# Patient Record
Sex: Female | Born: 1940 | Race: Black or African American | Hispanic: No | Marital: Married | Smoking: Former smoker
Health system: Southern US, Community
[De-identification: ages and names within clinical notes are randomized; demographics above are authoritative.]

## PROBLEM LIST (undated history)

## (undated) DIAGNOSIS — E119 Type 2 diabetes mellitus without complications: Secondary | ICD-10-CM

## (undated) DIAGNOSIS — J449 Chronic obstructive pulmonary disease, unspecified: Secondary | ICD-10-CM

## (undated) DIAGNOSIS — N183 Chronic kidney disease, stage 3 unspecified: Secondary | ICD-10-CM

## (undated) DIAGNOSIS — K635 Polyp of colon: Secondary | ICD-10-CM

## (undated) DIAGNOSIS — I10 Essential (primary) hypertension: Secondary | ICD-10-CM

## (undated) DIAGNOSIS — K7469 Other cirrhosis of liver: Secondary | ICD-10-CM

## (undated) DIAGNOSIS — E876 Hypokalemia: Secondary | ICD-10-CM

## (undated) DIAGNOSIS — I509 Heart failure, unspecified: Secondary | ICD-10-CM

## (undated) DIAGNOSIS — M109 Gout, unspecified: Secondary | ICD-10-CM

## (undated) DIAGNOSIS — I2510 Atherosclerotic heart disease of native coronary artery without angina pectoris: Secondary | ICD-10-CM

## (undated) HISTORY — PX: CORONARY ARTERY BYPASS GRAFT: SHX141

## (undated) HISTORY — DX: Polyp of colon: K63.5

## (undated) HISTORY — PX: CAROTID ENDARTERECTOMY: SUR193

## (undated) HISTORY — PX: CHOLECYSTECTOMY: SHX55

## (undated) HISTORY — PX: CORONARY ANGIOPLASTY: SHX604

---

## 2016-03-21 ENCOUNTER — Encounter (HOSPITAL_COMMUNITY): Payer: Self-pay | Admitting: Emergency Medicine

## 2016-03-21 DIAGNOSIS — E876 Hypokalemia: Secondary | ICD-10-CM | POA: Diagnosis present

## 2016-03-21 DIAGNOSIS — N179 Acute kidney failure, unspecified: Secondary | ICD-10-CM | POA: Diagnosis not present

## 2016-03-21 DIAGNOSIS — Z794 Long term (current) use of insulin: Secondary | ICD-10-CM | POA: Diagnosis not present

## 2016-03-21 DIAGNOSIS — I5022 Chronic systolic (congestive) heart failure: Secondary | ICD-10-CM | POA: Diagnosis not present

## 2016-03-21 DIAGNOSIS — I2510 Atherosclerotic heart disease of native coronary artery without angina pectoris: Secondary | ICD-10-CM | POA: Diagnosis not present

## 2016-03-21 DIAGNOSIS — Z951 Presence of aortocoronary bypass graft: Secondary | ICD-10-CM | POA: Diagnosis not present

## 2016-03-21 DIAGNOSIS — J449 Chronic obstructive pulmonary disease, unspecified: Secondary | ICD-10-CM | POA: Insufficient documentation

## 2016-03-21 DIAGNOSIS — D62 Acute posthemorrhagic anemia: Secondary | ICD-10-CM | POA: Diagnosis not present

## 2016-03-21 DIAGNOSIS — Z87891 Personal history of nicotine dependence: Secondary | ICD-10-CM | POA: Diagnosis not present

## 2016-03-21 DIAGNOSIS — R262 Difficulty in walking, not elsewhere classified: Secondary | ICD-10-CM | POA: Insufficient documentation

## 2016-03-21 DIAGNOSIS — E871 Hypo-osmolality and hyponatremia: Secondary | ICD-10-CM | POA: Diagnosis not present

## 2016-03-21 DIAGNOSIS — Z7982 Long term (current) use of aspirin: Secondary | ICD-10-CM | POA: Insufficient documentation

## 2016-03-21 DIAGNOSIS — K219 Gastro-esophageal reflux disease without esophagitis: Secondary | ICD-10-CM | POA: Diagnosis not present

## 2016-03-21 DIAGNOSIS — Z7901 Long term (current) use of anticoagulants: Secondary | ICD-10-CM | POA: Diagnosis not present

## 2016-03-21 DIAGNOSIS — N184 Chronic kidney disease, stage 4 (severe): Secondary | ICD-10-CM | POA: Diagnosis not present

## 2016-03-21 DIAGNOSIS — E785 Hyperlipidemia, unspecified: Secondary | ICD-10-CM | POA: Insufficient documentation

## 2016-03-21 DIAGNOSIS — I130 Hypertensive heart and chronic kidney disease with heart failure and stage 1 through stage 4 chronic kidney disease, or unspecified chronic kidney disease: Secondary | ICD-10-CM | POA: Diagnosis not present

## 2016-03-21 DIAGNOSIS — L299 Pruritus, unspecified: Secondary | ICD-10-CM | POA: Insufficient documentation

## 2016-03-21 DIAGNOSIS — D638 Anemia in other chronic diseases classified elsewhere: Secondary | ICD-10-CM | POA: Diagnosis not present

## 2016-03-21 DIAGNOSIS — I482 Chronic atrial fibrillation: Secondary | ICD-10-CM | POA: Diagnosis not present

## 2016-03-21 DIAGNOSIS — E1122 Type 2 diabetes mellitus with diabetic chronic kidney disease: Secondary | ICD-10-CM | POA: Insufficient documentation

## 2016-03-21 HISTORY — DX: Hypokalemia: E87.6

## 2016-03-21 LAB — CBC WITH DIFFERENTIAL/PLATELET
BASOS ABS: 0 10*3/uL (ref 0.0–0.1)
BASOS PCT: 1 %
EOS ABS: 0.1 10*3/uL (ref 0.0–0.7)
EOS PCT: 1 %
HCT: 27.8 % — ABNORMAL LOW (ref 36.0–46.0)
Hemoglobin: 8.6 g/dL — ABNORMAL LOW (ref 12.0–15.0)
LYMPHS PCT: 15 %
Lymphs Abs: 0.9 10*3/uL (ref 0.7–4.0)
MCH: 28.4 pg (ref 26.0–34.0)
MCHC: 30.9 g/dL (ref 30.0–36.0)
MCV: 91.7 fL (ref 78.0–100.0)
MONO ABS: 0.6 10*3/uL (ref 0.1–1.0)
Monocytes Relative: 9 %
Neutro Abs: 4.5 10*3/uL (ref 1.7–7.7)
Neutrophils Relative %: 74 %
PLATELETS: 270 10*3/uL (ref 150–400)
RBC: 3.03 MIL/uL — AB (ref 3.87–5.11)
RDW: 17.4 % — AB (ref 11.5–15.5)
WBC: 6.1 10*3/uL (ref 4.0–10.5)

## 2016-03-21 LAB — COMPREHENSIVE METABOLIC PANEL
ALT: 21 U/L (ref 14–54)
AST: 32 U/L (ref 15–41)
Albumin: 2.5 g/dL — ABNORMAL LOW (ref 3.5–5.0)
Alkaline Phosphatase: 286 U/L — ABNORMAL HIGH (ref 38–126)
Anion gap: 13 (ref 5–15)
BUN: 77 mg/dL — AB (ref 6–20)
CHLORIDE: 90 mmol/L — AB (ref 101–111)
CO2: 27 mmol/L (ref 22–32)
Calcium: 7.8 mg/dL — ABNORMAL LOW (ref 8.9–10.3)
Creatinine, Ser: 2.97 mg/dL — ABNORMAL HIGH (ref 0.44–1.00)
GFR, EST AFRICAN AMERICAN: 17 mL/min — AB (ref >60)
GFR, EST NON AFRICAN AMERICAN: 15 mL/min — AB (ref >60)
Glucose, Bld: 256 mg/dL — ABNORMAL HIGH (ref 65–99)
POTASSIUM: 2.4 mmol/L — AB (ref 3.5–5.1)
SODIUM: 130 mmol/L — AB (ref 135–145)
Total Bilirubin: 1.1 mg/dL (ref 0.3–1.2)
Total Protein: 7.2 g/dL (ref 6.5–8.1)

## 2016-03-21 LAB — CBG MONITORING, ED: GLUCOSE-CAPILLARY: 228 mg/dL — AB (ref 65–99)

## 2016-03-21 NOTE — ED Notes (Signed)
Pt. reports generalized skin itching ( scalp , back , arms ) onset today , airway intact/respirations unlabored , no oral swelling or fever .

## 2016-03-22 ENCOUNTER — Observation Stay (HOSPITAL_COMMUNITY): Payer: Medicare Other

## 2016-03-22 ENCOUNTER — Observation Stay (HOSPITAL_COMMUNITY)
Admission: EM | Admit: 2016-03-22 | Discharge: 2016-03-24 | Disposition: A | Discharge status: Skilled Nursing Facility | Payer: Medicare Other | Attending: Internal Medicine | Admitting: Internal Medicine

## 2016-03-22 ENCOUNTER — Encounter (HOSPITAL_COMMUNITY): Payer: Self-pay | Admitting: General Practice

## 2016-03-22 DIAGNOSIS — D638 Anemia in other chronic diseases classified elsewhere: Secondary | ICD-10-CM | POA: Diagnosis not present

## 2016-03-22 DIAGNOSIS — E876 Hypokalemia: Secondary | ICD-10-CM | POA: Diagnosis present

## 2016-03-22 DIAGNOSIS — I482 Chronic atrial fibrillation, unspecified: Secondary | ICD-10-CM | POA: Diagnosis present

## 2016-03-22 DIAGNOSIS — N184 Chronic kidney disease, stage 4 (severe): Secondary | ICD-10-CM | POA: Diagnosis present

## 2016-03-22 DIAGNOSIS — N289 Disorder of kidney and ureter, unspecified: Secondary | ICD-10-CM

## 2016-03-22 DIAGNOSIS — L299 Pruritus, unspecified: Secondary | ICD-10-CM | POA: Diagnosis not present

## 2016-03-22 DIAGNOSIS — N183 Chronic kidney disease, stage 3 (moderate): Secondary | ICD-10-CM

## 2016-03-22 DIAGNOSIS — E1122 Type 2 diabetes mellitus with diabetic chronic kidney disease: Secondary | ICD-10-CM | POA: Diagnosis present

## 2016-03-22 DIAGNOSIS — I5023 Acute on chronic systolic (congestive) heart failure: Secondary | ICD-10-CM | POA: Diagnosis present

## 2016-03-22 DIAGNOSIS — N179 Acute kidney failure, unspecified: Secondary | ICD-10-CM

## 2016-03-22 DIAGNOSIS — Z794 Long term (current) use of insulin: Secondary | ICD-10-CM

## 2016-03-22 HISTORY — DX: Hypokalemia: E87.6

## 2016-03-22 HISTORY — DX: Chronic kidney disease, stage 3 unspecified: N18.30

## 2016-03-22 HISTORY — DX: Chronic kidney disease, stage 3 (moderate): N18.3

## 2016-03-22 HISTORY — DX: Chronic obstructive pulmonary disease, unspecified: J44.9

## 2016-03-22 HISTORY — DX: Atherosclerotic heart disease of native coronary artery without angina pectoris: I25.10

## 2016-03-22 HISTORY — DX: Essential (primary) hypertension: I10

## 2016-03-22 HISTORY — DX: Gout, unspecified: M10.9

## 2016-03-22 HISTORY — DX: Type 2 diabetes mellitus without complications: E11.9

## 2016-03-22 HISTORY — DX: Heart failure, unspecified: I50.9

## 2016-03-22 LAB — TYPE AND SCREEN
ABO/RH(D): O POS
ANTIBODY SCREEN: NEGATIVE

## 2016-03-22 LAB — COMPREHENSIVE METABOLIC PANEL
ALK PHOS: 273 U/L — AB (ref 38–126)
ALT: 20 U/L (ref 14–54)
ANION GAP: 12 (ref 5–15)
AST: 32 U/L (ref 15–41)
Albumin: 2.4 g/dL — ABNORMAL LOW (ref 3.5–5.0)
BILIRUBIN TOTAL: 1.1 mg/dL (ref 0.3–1.2)
BUN: 76 mg/dL — ABNORMAL HIGH (ref 6–20)
CALCIUM: 7.6 mg/dL — AB (ref 8.9–10.3)
CO2: 25 mmol/L (ref 22–32)
Chloride: 93 mmol/L — ABNORMAL LOW (ref 101–111)
Creatinine, Ser: 2.85 mg/dL — ABNORMAL HIGH (ref 0.44–1.00)
GFR, EST AFRICAN AMERICAN: 18 mL/min — AB (ref >60)
GFR, EST NON AFRICAN AMERICAN: 15 mL/min — AB (ref >60)
GLUCOSE: 220 mg/dL — AB (ref 65–99)
Potassium: 3.3 mmol/L — ABNORMAL LOW (ref 3.5–5.1)
Sodium: 130 mmol/L — ABNORMAL LOW (ref 135–145)
TOTAL PROTEIN: 6.8 g/dL (ref 6.5–8.1)

## 2016-03-22 LAB — URINALYSIS, ROUTINE W REFLEX MICROSCOPIC
Bilirubin Urine: NEGATIVE
Glucose, UA: NEGATIVE mg/dL
HGB URINE DIPSTICK: NEGATIVE
Ketones, ur: NEGATIVE mg/dL
LEUKOCYTES UA: NEGATIVE
Nitrite: NEGATIVE
PROTEIN: 100 mg/dL — AB
Specific Gravity, Urine: 1.013 (ref 1.005–1.030)
pH: 6.5 (ref 5.0–8.0)

## 2016-03-22 LAB — URINE MICROSCOPIC-ADD ON
RBC / HPF: NONE SEEN RBC/hpf (ref 0–5)
WBC, UA: NONE SEEN WBC/hpf (ref 0–5)

## 2016-03-22 LAB — CBC WITH DIFFERENTIAL/PLATELET
Basophils Absolute: 0 10*3/uL (ref 0.0–0.1)
Basophils Relative: 1 %
Eosinophils Absolute: 0.1 10*3/uL (ref 0.0–0.7)
Eosinophils Relative: 1 %
HEMATOCRIT: 27.4 % — AB (ref 36.0–46.0)
HEMOGLOBIN: 8.5 g/dL — AB (ref 12.0–15.0)
LYMPHS ABS: 1.1 10*3/uL (ref 0.7–4.0)
Lymphocytes Relative: 17 %
MCH: 28.1 pg (ref 26.0–34.0)
MCHC: 31 g/dL (ref 30.0–36.0)
MCV: 90.4 fL (ref 78.0–100.0)
MONOS PCT: 12 %
Monocytes Absolute: 0.8 10*3/uL (ref 0.1–1.0)
NEUTROS ABS: 4.9 10*3/uL (ref 1.7–7.7)
NEUTROS PCT: 71 %
Platelets: 266 10*3/uL (ref 150–400)
RBC: 3.03 MIL/uL — ABNORMAL LOW (ref 3.87–5.11)
RDW: 17.2 % — ABNORMAL HIGH (ref 11.5–15.5)
WBC: 6.9 10*3/uL (ref 4.0–10.5)

## 2016-03-22 LAB — GLUCOSE, CAPILLARY
GLUCOSE-CAPILLARY: 203 mg/dL — AB (ref 65–99)
GLUCOSE-CAPILLARY: 158 mg/dL — AB (ref 65–99)
Glucose-Capillary: 203 mg/dL — ABNORMAL HIGH (ref 65–99)
Glucose-Capillary: 155 mg/dL — ABNORMAL HIGH (ref 65–99)

## 2016-03-22 LAB — IRON AND TIBC
Iron: 36 ug/dL (ref 28–170)
Saturation Ratios: 8 % — ABNORMAL LOW (ref 10.4–31.8)
TIBC: 440 ug/dL (ref 250–450)
UIBC: 404 ug/dL

## 2016-03-22 LAB — MAGNESIUM: Magnesium: 1.7 mg/dL (ref 1.7–2.4)

## 2016-03-22 LAB — ABO/RH: ABO/RH(D): O POS

## 2016-03-22 MED ORDER — INSULIN ASPART 100 UNIT/ML ~~LOC~~ SOLN
0.0000 [IU] | Freq: Three times a day (TID) | SUBCUTANEOUS | Status: DC
  Administered 2016-03-22: 3 [IU] via SUBCUTANEOUS
  Administered 2016-03-22: 2 [IU] via SUBCUTANEOUS
  Administered 2016-03-22: 3 [IU] via SUBCUTANEOUS
  Administered 2016-03-23: 2 [IU] via SUBCUTANEOUS
  Administered 2016-03-23: 3 [IU] via SUBCUTANEOUS
  Administered 2016-03-24 (×2): 1 [IU] via SUBCUTANEOUS
  Administered 2016-03-24: 3 [IU] via SUBCUTANEOUS

## 2016-03-22 MED ORDER — SODIUM CHLORIDE 0.9 % IV SOLN
INTRAVENOUS | Status: DC
  Administered 2016-03-22: 06:00:00 via INTRAVENOUS

## 2016-03-22 MED ORDER — HEPARIN SODIUM (PORCINE) 5000 UNIT/ML IJ SOLN: 5000.0000 [IU] | Freq: Three times a day (TID) | INTRAMUSCULAR | Status: DC

## 2016-03-22 MED ORDER — ACETAMINOPHEN 650 MG RE SUPP: 650.0000 mg | Freq: Four times a day (QID) | RECTAL | Status: DC | PRN

## 2016-03-22 MED ORDER — SODIUM CHLORIDE 0.9 % IV BOLUS (SEPSIS)
250.0000 mL | Freq: Once | INTRAVENOUS | Status: AC
  Administered 2016-03-22: 250 mL via INTRAVENOUS

## 2016-03-22 MED ORDER — HYDROXYZINE HCL 25 MG PO TABS: 25.0000 mg | ORAL_TABLET | ORAL | Status: DC | PRN

## 2016-03-22 MED ORDER — ALBUTEROL SULFATE (2.5 MG/3ML) 0.083% IN NEBU: 2.5000 mg | INHALATION_SOLUTION | RESPIRATORY_TRACT | Status: DC | PRN

## 2016-03-22 MED ORDER — GABAPENTIN 300 MG PO CAPS
300.0000 mg | ORAL_CAPSULE | Freq: Every day | ORAL | Status: DC | PRN
  Administered 2016-03-24: 300 mg via ORAL
  Filled 2016-03-22: qty 1

## 2016-03-22 MED ORDER — POTASSIUM CHLORIDE CRYS ER 20 MEQ PO TBCR
40.0000 meq | EXTENDED_RELEASE_TABLET | Freq: Once | ORAL | Status: AC
  Administered 2016-03-22: 40 meq via ORAL
  Filled 2016-03-22: qty 2

## 2016-03-22 MED ORDER — MAGNESIUM SULFATE 2 GM/50ML IV SOLN
2.0000 g | Freq: Once | INTRAVENOUS | Status: AC
  Administered 2016-03-22: 2 g via INTRAVENOUS
  Filled 2016-03-22: qty 50

## 2016-03-22 MED ORDER — ONDANSETRON HCL 4 MG/2ML IJ SOLN: 4.0000 mg | Freq: Four times a day (QID) | INTRAMUSCULAR | Status: DC | PRN

## 2016-03-22 MED ORDER — HYDROXYZINE HCL 25 MG PO TABS
25.0000 mg | ORAL_TABLET | Freq: Once | ORAL | Status: AC
  Administered 2016-03-22: 25 mg via ORAL
  Filled 2016-03-22: qty 1

## 2016-03-22 MED ORDER — INSULIN ASPART 100 UNIT/ML ~~LOC~~ SOLN
0.0000 [IU] | Freq: Every day | SUBCUTANEOUS | Status: DC
  Administered 2016-03-23: 2 [IU] via SUBCUTANEOUS

## 2016-03-22 MED ORDER — ONDANSETRON HCL 4 MG PO TABS: 4.0000 mg | ORAL_TABLET | Freq: Four times a day (QID) | ORAL | Status: DC | PRN

## 2016-03-22 MED ORDER — POTASSIUM CHLORIDE 10 MEQ/100ML IV SOLN
10.0000 meq | INTRAVENOUS | Status: DC
  Filled 2016-03-22: qty 100

## 2016-03-22 MED ORDER — HYDROXYZINE HCL 25 MG PO TABS
25.0000 mg | ORAL_TABLET | Freq: Four times a day (QID) | ORAL | Status: DC | PRN
  Administered 2016-03-22 (×4): 50 mg via ORAL
  Administered 2016-03-23 – 2016-03-24 (×3): 25 mg via ORAL
  Filled 2016-03-22 (×2): qty 1
  Filled 2016-03-22 (×3): qty 2
  Filled 2016-03-22: qty 1
  Filled 2016-03-22: qty 2

## 2016-03-22 MED ORDER — INSULIN GLARGINE 100 UNIT/ML ~~LOC~~ SOLN
10.0000 [IU] | Freq: Every day | SUBCUTANEOUS | Status: DC
  Administered 2016-03-22 – 2016-03-24 (×3): 10 [IU] via SUBCUTANEOUS
  Filled 2016-03-22 (×3): qty 0.1

## 2016-03-22 MED ORDER — ACETAMINOPHEN 325 MG PO TABS
650.0000 mg | ORAL_TABLET | Freq: Four times a day (QID) | ORAL | Status: DC | PRN
  Administered 2016-03-22: 650 mg via ORAL
  Filled 2016-03-22: qty 2

## 2016-03-22 MED ORDER — SODIUM CHLORIDE 0.9% FLUSH
3.0000 mL | Freq: Two times a day (BID) | INTRAVENOUS | Status: DC
  Administered 2016-03-22 – 2016-03-24 (×4): 3 mL via INTRAVENOUS

## 2016-03-22 MED ORDER — POTASSIUM CHLORIDE 10 MEQ/100ML IV SOLN
10.0000 meq | INTRAVENOUS | Status: AC
  Administered 2016-03-22 (×6): 10 meq via INTRAVENOUS
  Filled 2016-03-22 (×4): qty 100

## 2016-03-22 MED ORDER — INSULIN GLARGINE 100 UNIT/ML ~~LOC~~ SOLN
15.0000 [IU] | Freq: Every day | SUBCUTANEOUS | Status: DC
  Filled 2016-03-22: qty 0.15

## 2016-03-22 MED ORDER — AMLODIPINE BESYLATE 10 MG PO TABS
10.0000 mg | ORAL_TABLET | Freq: Every day | ORAL | Status: DC
Start: 2016-03-22 — End: 2016-03-24
  Administered 2016-03-22 – 2016-03-24 (×3): 10 mg via ORAL
  Filled 2016-03-22 (×2): qty 2
  Filled 2016-03-22: qty 1

## 2016-03-22 MED ORDER — PANTOPRAZOLE SODIUM 40 MG PO TBEC
40.0000 mg | DELAYED_RELEASE_TABLET | Freq: Two times a day (BID) | ORAL | Status: DC
  Administered 2016-03-22 – 2016-03-24 (×5): 40 mg via ORAL
  Filled 2016-03-22 (×5): qty 1

## 2016-03-22 MED ORDER — PERMETHRIN 5 % EX CREA
TOPICAL_CREAM | Freq: Once | CUTANEOUS | Status: AC
  Administered 2016-03-22: 04:00:00 via TOPICAL
  Filled 2016-03-22: qty 60

## 2016-03-22 NOTE — Evaluation (Signed)
Physical Therapy Evaluation Patient Details Name: Cassandra Bonilla MRN: [id number] DOB: [birth year] Today's Date: 03/22/2016   History of Present Illness  Pt is a 75 y/o female who presents with "itching all over" for 4-6 weeks. Pt lives at home with husband and other family, and reports recent falls and LE swelling. PMH includes CAD s/p CABG, HTN, systolic CHF last echo in 01/2016 EF noted to be 40-45% with severe pulmonary hypertension, CKD stage III-IV, chronic A. Fib.  Clinical Impression  Pt admitted with above diagnosis. Pt currently with functional limitations due to the deficits listed below (see PT Problem List). At the time of PT eval pt was able to perform transfers and ambulation with min guard to min assist for balance support, walker management, and general safety. Pt reports recent falls at home, and feel she would benefit from short term rehab at the SNF level prior to return home. Pt agreeable. Pt will benefit from skilled PT to increase their independence and safety with mobility to allow discharge to the venue listed below.       Follow Up Recommendations SNF;Supervision/Assistance - 24 hour    Equipment Recommendations  Rolling walker with 5" wheels;3in1 (PT)    Recommendations for Other Services       Precautions / Restrictions Precautions Precautions: Fall Restrictions Weight Bearing Restrictions: No      Mobility  Bed Mobility Overal bed mobility: Modified Independent             General bed mobility comments: Pt was able to transition to EOB with no physical assist and no use of rails. HOB flat.   Transfers Overall transfer level: Needs assistance Equipment used: Rolling walker (2 wheeled) Transfers: Sit to/from Stand Sit to Stand: Min guard         General transfer comment: Pt required hands-on guarding to power-up to full standing position. Initially attempted without AD and pt was not able to maintain standing. With RW pt was able to steady herself and  maintain standing prior to initiation of gait training.   Ambulation/Gait Ambulation/Gait assistance: Min assist Ambulation Distance (Feet): 60 Feet Assistive device: Rolling walker (2 wheeled) Gait Pattern/deviations: Step-through pattern;Decreased stride length;Trunk flexed Gait velocity: Decreased Gait velocity interpretation: Below normal speed for age/gender General Gait Details: Fatigues quickly. Max encouragement required to achieve 60 feet. Pt required frequent assist for walker management especially with turns.   Stairs            Wheelchair Mobility    Modified Rankin (Stroke Patients Only)       Balance Overall balance assessment: Needs assistance Sitting-balance support: Feet supported;No upper extremity supported Sitting balance-Leahy Scale: Fair     Standing balance support: Bilateral upper extremity supported;During functional activity Standing balance-Leahy Scale: Poor                               Pertinent Vitals/Pain Pain Assessment: No/denies pain    Home Living Family/patient expects to be discharged to:: Private residence Living Arrangements: Spouse/significant other;Children;Other relatives (An Aunt) Available Help at Discharge: Family;Available 24 hours/day Type of Home: House Home Access: Stairs to enter Entrance Stairs-Rails: Psychiatric nurse of Steps: 6 (questionable) Home Layout: One level Home Equipment: Cane - single point      Prior Function Level of Independence: Independent               Hand Dominance   Dominant Hand: Right    Extremity/Trunk Assessment  Upper Extremity Assessment: Defer to OT evaluation;Generalized weakness           Lower Extremity Assessment: Generalized weakness      Cervical / Trunk Assessment: Kyphotic  Communication   Communication: No difficulties  Cognition Arousal/Alertness: Awake/alert Behavior During Therapy: WFL for tasks  assessed/performed Overall Cognitive Status: Within Functional Limits for tasks assessed                      General Comments      Exercises        Assessment/Plan    PT Assessment Patient needs continued PT services  PT Diagnosis Difficulty walking;Acute pain   PT Problem List Decreased strength;Decreased range of motion;Decreased activity tolerance;Decreased balance;Decreased mobility;Decreased knowledge of use of DME;Decreased safety awareness;Decreased knowledge of precautions;Pain  PT Treatment Interventions DME instruction;Gait training;Stair training;Functional mobility training;Therapeutic activities;Therapeutic exercise;Neuromuscular re-education;Patient/family education   PT Goals (Current goals can be found in the Care Plan section) Acute Rehab PT Goals Patient Stated Goal: Get better PT Goal Formulation: With patient Time For Goal Achievement: 03/29/16 Potential to Achieve Goals: Good    Frequency Min 2X/week   Barriers to discharge        Co-evaluation               End of Session Equipment Utilized During Treatment: Gait belt Activity Tolerance: Patient limited by fatigue Patient left: with call bell/phone within reach;Other (comment) (On BSC - NT aware) Nurse Communication: Mobility status    Functional Assessment Tool Used: Clinical judgement Functional Limitation: Mobility: Walking and moving around Mobility: Walking and Moving Around Current Status [id number]): At least 1 percent but less than 20 percent impaired, limited or restricted Mobility: Walking and Moving Around Goal Status [phone]): At least 1 percent but less than 20 percent impaired, limited or restricted    Time: [id number] PT Time Calculation (min) (ACUTE ONLY): 26 min   Charges:   PT Evaluation $PT Eval Moderate Complexity: 1 Procedure PT Treatments $Gait Training: 8-22 mins   PT G Codes:   PT G-Codes **NOT FOR INPATIENT CLASS** Functional Assessment Tool Used: Clinical  judgement Functional Limitation: Mobility: Walking and moving around Mobility: Walking and Moving Around Current Status [id number]): At least 1 percent but less than 20 percent impaired, limited or restricted Mobility: Walking and Moving Around Goal Status [phone]): At least 1 percent but less than 20 percent impaired, limited or restricted    Rolinda Roan 03/22/2016, 2:55 PM  Rolinda Roan, PT, DPT Acute Rehabilitation Services Pager: [phone]

## 2016-03-22 NOTE — Clinical Social Work Note (Signed)
Clinical Social Work Assessment  Patient Details  Name: Cassandra Bonilla MRN: [id number] Date of Birth: [birth year]  Date of referral:  03/22/16               Reason for consult:  Facility Placement, Discharge Planning                Permission sought to share information with:  Facility Sport and exercise psychologist, Family Supports Permission granted to share information::  Yes, Verbal Permission Granted  Name::     Veterinary surgeon::  SNF's  Relationship::  Spouse  Contact Information:  [phone]  Housing/Transportation Living arrangements for the past 2 months:  Single Family Home Source of Information:  Patient, Medical Team Patient Interpreter Needed:  None Criminal Activity/Legal Involvement Pertinent to Current Situation/Hospitalization:  No - Comment as needed Significant Relationships:  Adult Children, Spouse, Other Family Members Lives with:  Adult Children, Spouse, Other (Comment) (Aunt) Do you feel safe going back to the place where you live?  Yes Need for family participation in patient care:  Yes (Comment)  Care giving concerns:  PT recommending SNF once medically stable for discharge.   Social Worker assessment / plan:  CSW met with patient. No supports at bedside. CSW introduced role and explained that discharge planning would be discussed. CSW confirmed PT recommendations with patient. Patient agreeable. No preference on facility. No further concerns. CSW encouraged patient to contact CSW as needed. CSW will continue to follow patient for support and facilitate discharge to SNF once medically stable.  Employment status:  Retired Nurse, adult PT Recommendations:  [place] / Referral to community resources:  [place]  Patient/Family's Response to care:  Patient agreeable to SNF placement. Patient's family supportive and involved in patient's care. Patient appreciated social work  intervention.  Patient/Family's Understanding of and Emotional Response to Diagnosis, Current Treatment, and Prognosis:  Patient understands need for rehab prior to returning home. Patient appears happy with care at hospital.  Emotional Assessment Appearance:  Appears stated age Attitude/Demeanor/Rapport:   (Pleasant) Affect (typically observed):  Accepting, Appropriate, Calm, Pleasant Orientation:  Oriented to Self, Oriented to Place, Oriented to  Time, Oriented to Situation Alcohol / Substance use:  Never Used Psych involvement (Current and /or in the community):  No (Comment)  Discharge Needs  Concerns to be addressed:  Care Coordination Readmission within the last 30 days:  No Current discharge risk:  Dependent with Mobility Barriers to Discharge:  No Barriers Identified   Candie Chroman, LCSW 03/22/2016, 4:11 PM

## 2016-03-22 NOTE — ED Provider Notes (Signed)
CSN: [id number]     Arrival date & time 03/21/16  2202 History  By signing my name below, I, Evelene Croon, attest that this documentation has been prepared under the direction and in the presence of Merryl Hacker, MD . Electronically Signed: Evelene Croon, Scribe. 03/22/2016. 12:49 AM.  Chief Complaint  Patient presents with  . Skin Problem    The history is provided by the patient and the spouse. No language interpreter was used.    HPI Comments:  Shardea Richmond is a 75 y.o. female who presents to the Emergency Department complaining of generalized pruritus x a few days. She reports a h/o similar episode of few weeks ago in [place]; husband states she was evaluated by her PCP in [place].  She was not diagnosed with anything. She denies any new exposures or medications. She denies rash but the daughter is concerned about scabies as there have been recent outbreaks. No one else in the house has had a similar rash. Pt has a h/o liver and kidney disease, and h/o hypokalemia but pt is not currently taking any potassium supplements. She denies CP, SOB, fever, and weakness. No alleviating factors noted; denies use of benadryl.    Patient is generally a poor historian. She just moved to [place] from [place]. No old records available.    Past Medical History  Diagnosis Date  . Diabetes mellitus without complication ([place])   . Hypertension   . COPD (chronic obstructive pulmonary disease) ([place])   . CHF (congestive heart failure) ([place])   . Gout   . Coronary artery disease    Past Surgical History  Procedure Laterality Date  . Cardiac surgery     No family history on file. Social History  Substance Use Topics  . Smoking status: Former Research scientist (life sciences)  . Smokeless tobacco: None  . Alcohol Use: No   OB History    No data available     Review of Systems  Constitutional: Negative for fever and chills.  Respiratory: Negative for shortness of breath.   Cardiovascular: Negative for chest pain.   Skin: Positive for rash.       + Pruritus  Neurological: Negative for weakness.    Allergies  Review of patient's allergies indicates no known allergies.  Home Medications   Prior to Admission medications   Not on File   BP 132/68 mmHg  Pulse 89  Temp(Src) 98.5 F (36.9 C) (Oral)  Resp 18  Ht 5\' 2"  (1.575 m)  Wt 149 lb 5 oz (67.728 kg)  BMI 27.30 kg/m2  SpO2 100% Physical Exam  Constitutional: She is oriented to person, place, and time.  Chronically ill-appearing, no acute distress  HENT:  Head: Normocephalic and atraumatic.  Eyes: Pupils are equal, round, and reactive to light.  Cardiovascular: Normal rate and regular rhythm.   Murmur heard. Pulmonary/Chest: Effort normal and breath sounds normal. No respiratory distress. She has no wheezes.  Abdominal: Soft. Bowel sounds are normal. There is no tenderness. There is no rebound.  Neurological: She is alert and oriented to person, place, and time.  Skin: Skin is warm and dry.  Dry skin noted, she does have excoriations and linear hyper pigmentations over the bilateral upper extremities as well as the bilateral ankles, no redness or erythema  Psychiatric: She has a normal mood and affect.  Nursing note and vitals reviewed.   ED Course  Procedures   DIAGNOSTIC STUDIES:  Oxygen Saturation is 100% on RA, normal by my interpretation.  COORDINATION OF CARE:  12:45 AM Discussed treatment plan with pt at bedside and pt agreed to plan.  Labs Review Labs Reviewed  CBC WITH DIFFERENTIAL/PLATELET - Abnormal; Notable for the following:    RBC 3.03 (*)    Hemoglobin 8.6 (*)    HCT 27.8 (*)    RDW 17.4 (*)    All other components within normal limits  COMPREHENSIVE METABOLIC PANEL - Abnormal; Notable for the following:    Sodium 130 (*)    Potassium 2.4 (*)    Chloride 90 (*)    Glucose, Bld 256 (*)    BUN 77 (*)    Creatinine, Ser 2.97 (*)    Calcium 7.8 (*)    Albumin 2.5 (*)    Alkaline Phosphatase 286 (*)     GFR calc non Af Amer 15 (*)    GFR calc Af Amer 17 (*)    All other components within normal limits  URINALYSIS, ROUTINE W REFLEX MICROSCOPIC (NOT AT [facility]) - Abnormal; Notable for the following:    Protein, ur 100 (*)    All other components within normal limits  URINE MICROSCOPIC-ADD ON - Abnormal; Notable for the following:    Squamous Epithelial / LPF 0-5 (*)    Bacteria, UA RARE (*)    All other components within normal limits  CBG MONITORING, ED - Abnormal; Notable for the following:    Glucose-Capillary 228 (*)    All other components within normal limits  MAGNESIUM    Imaging Review No results found. I have personally reviewed and evaluated these images and lab results as part of my medical decision-making.   EKG Interpretation   Date/Time:  Friday March 22 2016 01:38:51 EDT Ventricular Rate:  95 PR Interval:    QRS Duration: 96 QT Interval:  386 QTC Calculation: 486 R Axis:   91 Text Interpretation:  Atrial fibrillation Consider left ventricular  hypertrophy Inferior infarct, old Lateral leads are also involved No prior  for comparison Confirmed by Eero Dini  MD, Loma Sousa ([id number]) on 03/22/2016  1:54:05 AM       1:43 AM Discussed case with Dr.Rondell Tamala Julian (hospitalist)    MDM   Final diagnoses:  Pruritic condition  Hypokalemia  Renal disease    Patient presents with itching.  She denies any other symptoms at this time. She is a poor historian but it appears that she has an extensive heart history as well as renal failure and liver disease at baseline.  Lab work was sent from triage and shows mild hyponatremia and hypokalemia with a K of 2.4. Creatinine is 2.97. Baseline unknown. Hemoglobin 8.6. Again Baseline unknown. EKG shows no evidence of ischemia. Patient reports acute on chronic itchiness. She does have some linear excoriations they could be concerning for scabies especially given the daughter's concern; however, no one else seems to have similar symptoms.  Will treat presumptively. She was placed on contact precautions. Given her hypokalemia, will admit to the hospitalist for potassium replacement and repeat lab work.  I personally performed the services described in this documentation, which was scribed in my presence. The recorded information has been reviewed and is accurate.    Merryl Hacker, MD 03/22/16 [phone]

## 2016-03-22 NOTE — Clinical Social Work Placement (Signed)
   CLINICAL SOCIAL WORK PLACEMENT  NOTE  Date:  03/22/2016  Patient Details  Name: Cassandra Bonilla MRN: [id number] Date of Birth: 07/07/1941  Clinical Social Work is seeking post-discharge placement for this patient at the [place] level of care (*CSW will initial, date and re-position this form in  chart as items are completed):  Yes   Patient/family provided with [facility] Work Department's list of facilities offering this level of care within the geographic area requested by the patient (or if unable, by the patient's family).  Yes   Patient/family informed of their freedom to choose among providers that offer the needed level of care, that participate in Medicare, Medicaid or managed care program needed by the patient, have an available bed and are willing to accept the patient.  Yes   Patient/family informed of [facility]'s ownership interest in [facility] and [facility], as well as of the fact that they are under no obligation to receive care at these facilities.  PASRR submitted to EDS on 03/22/16     PASRR number received on 03/22/16     Existing PASRR number confirmed on       FL2 transmitted to all facilities in geographic area requested by pt/family on 03/22/16     FL2 transmitted to all facilities within larger geographic area on       Patient informed that his/her managed care company has contracts with or will negotiate with certain facilities, including the following:            Patient/family informed of bed offers received.  Patient chooses bed at       Physician recommends and patient chooses bed at      Patient to be transferred to   on  .  Patient to be transferred to facility by       Patient family notified on   of transfer.  Name of family member notified:        PHYSICIAN Please sign FL2     Additional Comment:    _______________________________________________ Candie Chroman, LCSW 03/22/2016, 4:14  PM

## 2016-03-22 NOTE — Progress Notes (Signed)
75 year old lady with extensive past medical history significant of CAD s/p CABG, HTN, IDDM, systolic CHF last echo in 01/2016 EF noted to be 40-45% with severe pulmonary hypertension, CKD stage III-IV, chronic A. Fib, off anticoagulation, recent GI bleed,  presents with chief complaints of itching all over for the last 4 to 6 weeks, intermittently, unclear what exacerbates it , but atarax helps relieve it.  She was also found to be anemic this admission and will request Gi consultation for possible colonoscopy.  Resume atarax.   Please see H&P in detail by Dr Tamala Julian earlier this am for further details.    Hosie Poisson, MD [phone]

## 2016-03-22 NOTE — Care Management Obs Status (Signed)
[facility] NOTIFICATION   Patient Details  Name: Cassandra Bonilla MRN: [id number] Date of Birth: April 27, 1941   Medicare Observation Status Notification Given:  Yes (pt stated she couldnt sign documents and daughter at bedside refused to sign)    Maryclare Labrador, RN 03/22/2016, 4:16 PM

## 2016-03-22 NOTE — Care Management Note (Addendum)
Case Management Note  Patient Details  Name: Cassandra Bonilla MRN: [id number] Date of Birth: [birth year]  Subjective/Objective:    Pt admitted with hypokalemia                Action/Plan:  Pt recently moved to [place] from [place].  Stays with husband - daughter at bedside and reports concerns with mom returning home due to lack of steadiness with mobility and recent falls.  CM placed PT/OT eval and informed CSW of tentative referral.  CM provided health connect number for PCP assistance - informed that she can not provide medication assistance due to current coverage with insurance.  Also, informed pt that she could call number located on back of insurance card for assistance with finding a PCP.  CM will continue to follow for discharge needs   Expected Discharge Date:                  Expected Discharge Plan:  [name]  In-House Referral:  Clinical Social Work  Discharge planning Services  CM Consult  Post Acute Care Choice:    Choice offered to:     DME Arranged:    DME Agency:     HH Arranged:    [place] Agency:     Status of Service:  In process, will continue to follow  If discussed at Long Length of Stay Meetings, dates discussed:    Additional Comments: CSW consulted for SNF Maryclare Labrador, RN 03/22/2016, 1:27 PM

## 2016-03-22 NOTE — H&P (Addendum)
History and Physical    Cassandra Bonilla [id number] DOB: July 11, 1941 DOA: 03/22/2016  Referring MD/NP/PA: Dr. Dina Rich PCP: No primary care provider on file.  Patient coming from: Home  Chief Complaint: "I was itching all over"  HPI: Cassandra Bonilla is a 75 y.o. female with extensive past medical history significant of CAD s/p CABG, HTN, IDDM, systolic CHF last echo in 01/2016 EF noted to be 40-45% with severe pulmonary hypertension, CKD stage III-IV, chronic A. Fib; who presents with complaints of itching all over. Patient previously lived in [place], and had just moved to [place]. States symptoms have occurred off and on over the last 1 month. Patient states that there is not one specific spot on her body that is affected more than another. Family members in the room state that symptoms have intermittently occurred for a longer period of time. Patient notes that during her previous hospitalization last month in [place] and they gave her some medicine to help control the itching during that time. Family denies any history of the patient having scabies or anyone else in the household having similar symptoms. Patient also was noted to have blood glucose levels as high as 500 at home. Patient's husband is able to produce some documentation regarding most recent hospitalization at [facility] in [place], [place] at the end of May through the earlier part of June for her bleeding. She had been on Coumadin for the history of atrial fibrillation. During the hospitalization she reportedly had a EGD, but was unable to be successfully prepped for a colonoscopy as an inpatient. She required two blood transfusions and family notes that she was stopped on Coumadin, atenolol, metoprolol, and Lantus at discharge. Patient started new medications of Protonix 40 mg by mouth twice a day. She was continued on previous medications of allopurinol 100 mg daily, amlodipine 10 mg daily, aspirin 81 mg daily,  atorvastatin 40 mg daily, furosemide 80 mg twice a day, and Humalog 4 units subcutaneous prior to dinner. Lab work on 6/1 showed a hemoglobin of 9.5, creatinine  1.9, BUN 59. Patient has never had a colonoscopy previously in the past. Patient denies any significant shortness of breath, chest pain, dizziness, diarrhea, blood in stool/urine.   ED Course: Upon admission to the emergency department patient was evaluated and seen to be afebrile with vitals within normal limits. Lab work: WBC 6.1, hemoglobin 8.6, platelets 270, sodium 1:30, potassium 2.4, chloride 90, BUN 77, creatinine 2.97, calcium 7.8, glucose 256. Urinalysis was negative for any acute abnormalities. Patient was given 40 mEq of potassium chloride orally and started on IV potassium as well n the ED. She also received hydroxyzine and permethrin cream. TRH called to admit.  Review of Systems: As per HPI otherwise 10 point review of systems negative.   Past Medical History  Diagnosis Date  . Diabetes mellitus without complication ([place])   . Hypertension   . COPD (chronic obstructive pulmonary disease) ([place])   . CHF (congestive heart failure) ([place])   . Gout   . Coronary artery disease     Past Surgical History  Procedure Laterality Date  . Cardiac surgery       reports that she has quit smoking. She does not have any smokeless tobacco history on file. She reports that she does not drink alcohol or use illicit drugs.  No Known Allergies  No family history on file.  Prior to Admission medications   Not on File    Physical Exam:  Constitutional:  Elderly female who appears to be in mild distress unable to get comfortable on the hospital gurney Filed Vitals:   03/21/16 2209  BP: 132/68  Pulse: 89  Temp: 98.5 F (36.9 C)  TempSrc: Oral  Resp: 18  Height: 5\' 2"  (1.575 m)  Weight: 67.728 kg (149 lb 5 oz)  SpO2: 100%   Eyes: PERRL, lids and conjunctivae normal ENMT: Mucous membranes are moist. Posterior pharynx clear of  any exudate or lesions.Normal dentition.  Neck: normal, supple, no masses, no thyromegaly Respiratory: clear to auscultation bilaterally, no wheezing, no crackles. Normal respiratory effort. No accessory muscle use.  Cardiovascular: Regular rate and rhythm, no murmurs / rubs / gallops. +1 pitting edema of the bilateral lower extremities to the mid tibial region. 2+ pedal pulses. No carotid bruits.  Abdomen: no tenderness, no masses palpated. No hepatosplenomegaly. Bowel sounds positive.  Musculoskeletal: no clubbing / cyanosis. No joint deformity upper and lower extremities. Good ROM, no contractures. Normal muscle tone.  Skin: no rashes, Excoriations noted of bilateral arms. No track marks noted on the webbing of the hands or feet. Neurologic: CN 2-12 grossly intact. Sensation intact, DTR normal. Strength 5/5 in all 4.  Psychiatric: Normal judgment and insight. Alert and oriented x 3. Normal mood.     Labs on Admission: I have personally reviewed following labs and imaging studies  CBC:  Recent Labs Lab 03/21/16 2247  WBC 6.1  NEUTROABS 4.5  HGB 8.6*  HCT 27.8*  MCV 91.7  PLT [id number]   Basic Metabolic Panel:  Recent Labs Lab 03/21/16 2247  NA 130*  K 2.4*  CL 90*  CO2 27  GLUCOSE 256*  BUN 77*  CREATININE 2.97*  CALCIUM 7.8*   GFR: Estimated Creatinine Clearance: 15 mL/min (by C-G formula based on Cr of 2.97). Liver Function Tests:  Recent Labs Lab 03/21/16 2247  AST 32  ALT 21  ALKPHOS 286*  BILITOT 1.1  PROT 7.2  ALBUMIN 2.5*   No results for input(s): LIPASE, AMYLASE in the last 168 hours. No results for input(s): AMMONIA in the last 168 hours. Coagulation Profile: No results for input(s): INR, PROTIME in the last 168 hours. Cardiac Enzymes: No results for input(s): CKTOTAL, CKMB, CKMBINDEX, TROPONINI in the last 168 hours. BNP (last 3 results) No results for input(s): PROBNP in the last 8760 hours. HbA1C: No results for input(s): HGBA1C in the last 72  hours. CBG:  Recent Labs Lab 03/21/16 2225  GLUCAP 228*   Lipid Profile: No results for input(s): CHOL, HDL, LDLCALC, TRIG, CHOLHDL, LDLDIRECT in the last 72 hours. Thyroid Function Tests: No results for input(s): TSH, T4TOTAL, FREET4, T3FREE, THYROIDAB in the last 72 hours. Anemia Panel: No results for input(s): VITAMINB12, FOLATE, FERRITIN, TIBC, IRON, RETICCTPCT in the last 72 hours. Urine analysis: No results found for: COLORURINE, APPEARANCEUR, LABSPEC, PHURINE, GLUCOSEU, HGBUR, BILIRUBINUR, KETONESUR, PROTEINUR, UROBILINOGEN, NITRITE, LEUKOCYTESUR Sepsis Labs: No results found for this or any previous visit (from the past 240 hour(s)).   Radiological Exams on Admission: No results found.  EKG: Independently reviewed. Atrial fibrillation with rate of 92  Assessment/Plan Hypokalemia: Potassium 2.4 on admission. Patient given 40 meq of potassium chloride in the ED and started on potassium chloride IV. - Admit to a telemetry bed - Check magnesium level - Continue potassium chloride IV - Recheck CMP in a.m.  Pruritus: Physical exam findings are not consistent with scabies. Family request that patient still receive a trial of permethrin cream despite patient acknowledging that the hydroxyzine relieved  itching symptoms. Neuritis symptoms likely secondary to elevated BUN and kidney failure. - Hydroxyzine 25-50 mg po q6hr prn itching - Gabapentin daily prn itching  - Do not feel that patient has scabies - Reexamine in a.m. and discontinue contact precautions if not needed   Acute renal failure on chronic kidney disease III/IV: Patient's last creatinine available on care everywhere showed a creatinine baseline of 1.9-2.3. Patient presents with a creatinine of 2.97 and a BUN of 77. Question over diuresis with diuretics. - Held a.m. Lasix dose  - Normal saline bolus 250 ml x 1 dose now IV fluids at 75 mL per hour - recheck cmp in am  Anemia of chronic disease/acute blood loss  anemia: Hemoglobin noted to be 11 in the earlier part of 01/2016. Prior to her discharge from the hospital on 02/15/2016  hemoglobin was noted to be 9.5. Hemoglobin on presentation today noted to be 8.6 with elevated RDW signifying iron deficiency. - Patient needs to be set up with a gastroenterologist inpatient/outpatient for possible colonoscopy - Repeat CBC in a.m. - T&S   - Check iron studies  Chronic atrial fibrillation: Patient currently not on anticoagulation therapy after recent GI bleed. Chadsvasc score= 6 given 6, age, HTN, CHF, amylase mellitus type II, and CAD. Rates appear to be relatively well controlled. - Continue to monitor  Diabetes mellitus type 2: Last hemoglobin A1c was 7.5 on 01/31/2016. Review patient's last endocrinology note 5/17 states that the patient was supposed to take Lantus 15 units morning and Humalog 4 units before dinner. And if CBG is> 300 prior to start a meal that she is supposed to take 5 units. During her last hospitalization likely with the patient being NPO she had hypoglycemic episodes for which Lantus was stopped. Family notes that home blood sugars noted to be as high as 500 at home. - Hypoglycemic protocols - modified previous Lantus 10 units subcutaneously each morning - CBGs every before meals and at bedtime with sensitive sliding scale insulin for now   Systolic congestive heart failure : Last echo in 01/2016 EF noted to be 40-45% with severe pulmonary hypertension - Held a.m. Lasix dose - Will also need to add on scheduled potassium supplement - Strict intake and output  Essential hypertension - Continue amlodipine 10 mg  Elevated alkaline phosphatase: ALP 286 on admission. Looking back through records it appears that he has had negative hepatitis screens.  Hyperlipidemia - Held atorvastatin for now  GERD - Protonix 40 mg twice a day  DVT prophylaxis: SCDs. Heparin initially ordered, but discontinued after her husband able to produce post  recent records. Code Status: Full code Family Communication: Discussed overall plan with family at bedside Disposition Plan: Possible discharge home 1- 3 days Consults called: None Admission status: Telemetry observation  Norval Morton MD [facility] Pager [phone]  If 7PM-7AM, please contact night-coverage [email or web address] Password [email or web address]  03/22/2016, 1:43 AM

## 2016-03-22 NOTE — NC FL2 (Signed)
[place] LEVEL OF CARE SCREENING TOOL     IDENTIFICATION  Patient Name: Cassandra Bonilla Birthdate: December 14, 1940 Sex: female Admission Date (Current Location): 03/22/2016  [facility] and [place] Number:  [employer] and Address:  [facility] [place], [place] [address] [place], [place] [address]      Provider Number: [id number]  Attending Physician Name and Address:  Hosie Poisson, MD  Relative Name and Phone Number:       Current Level of Care: Hospital Recommended Level of Care: [place] Prior Approval Number:    Date Approved/Denied:   PASRR Number: [id number]  Discharge Plan: SNF    Current Diagnoses: Patient Active Problem List   Diagnosis Date Noted  . Hypokalemia 03/22/2016  . Pruritus 03/22/2016  . Anemia of chronic disease 03/22/2016  . Chronic atrial fibrillation ([place]) 03/22/2016  . Diabetes mellitus type 2 with complications ([place]) [id number]  . Chronic systolic congestive heart failure ([place]) 03/22/2016  . Acute renal failure superimposed on stage 3 chronic kidney disease ([place]) 03/22/2016    Orientation RESPIRATION BLADDER Height & Weight     Self, Time, Situation, Place  Normal Continent Weight: 149 lb (67.586 kg) Height:  5\' 2"  (157.5 cm)  BEHAVIORAL SYMPTOMS/MOOD NEUROLOGICAL BOWEL NUTRITION STATUS   (None)  (None) Continent Diet (Heart healthy/carb-modified)  AMBULATORY STATUS COMMUNICATION OF NEEDS Skin   Limited Assist Verbally Normal                       Personal Care Assistance Level of Assistance  Bathing, Feeding, Dressing Bathing Assistance: Limited assistance Feeding assistance: Independent Dressing Assistance: Limited assistance     Functional Limitations Info  Sight, Hearing, Speech Sight Info: Adequate Hearing Info: Adequate Speech Info: Adequate    SPECIAL CARE FACTORS FREQUENCY  Diabetic urine testing, PT (By licensed PT)     PT Frequency: 5 x week               Contractures Contractures Info: Not present    Additional Factors Info  Code Status, Allergies Code Status Info: Full Allergies Info: NKDA           Current Medications (03/22/2016):  This is the current hospital active medication list Current Facility-Administered Medications  Medication Dose Route Frequency Provider Last Rate Last Dose  . 0.9 %  sodium chloride infusion   Intravenous Continuous Norval Morton, MD 75 mL/hr at 03/22/16 0557    . acetaminophen (TYLENOL) tablet 650 mg  650 mg Oral Q6H PRN Norval Morton, MD   650 mg at 03/22/16 [id number]   Or  . acetaminophen (TYLENOL) suppository 650 mg  650 mg Rectal Q6H PRN Norval Morton, MD      . albuterol (PROVENTIL) (2.5 MG/3ML) 0.083% nebulizer solution 2.5 mg  2.5 mg Nebulization Q2H PRN Rondell A Tamala Julian, MD      . amLODipine (NORVASC) tablet 10 mg  10 mg Oral Daily Norval Morton, MD   10 mg at 03/22/16 1002  . gabapentin (NEURONTIN) capsule 300 mg  300 mg Oral Daily PRN Norval Morton, MD      . hydrOXYzine (ATARAX/VISTARIL) tablet 25-50 mg  25-50 mg Oral Q6H PRN Norval Morton, MD   50 mg at 03/22/16 1002  . insulin aspart (novoLOG) injection 0-5 Units  0-5 Units Subcutaneous QHS Rondell A Smith, MD      . insulin aspart (novoLOG) injection 0-9 Units  0-9 Units Subcutaneous TID WC Rondell  Charmayne Sheer, MD   3 Units at 03/22/16 1156  . insulin glargine (LANTUS) injection 10 Units  10 Units Subcutaneous Daily Norval Morton, MD   10 Units at 03/22/16 1002  . ondansetron (ZOFRAN) tablet 4 mg  4 mg Oral Q6H PRN Norval Morton, MD       Or  . ondansetron (ZOFRAN) injection 4 mg  4 mg Intravenous Q6H PRN Rondell A Tamala Julian, MD      . pantoprazole (PROTONIX) EC tablet 40 mg  40 mg Oral BID Norval Morton, MD   40 mg at 03/22/16 1002  . sodium chloride flush (NS) 0.9 % injection 3 mL  3 mL Intravenous Q12H Norval Morton, MD   3 mL at 03/22/16 0454     Discharge Medications: Please see discharge summary for a list of discharge  medications.  Relevant Imaging Results:  Relevant Lab Results:   Additional Information SS#: [id number]  Candie Chroman, LCSW

## 2016-03-22 NOTE — ED Notes (Signed)
Dr. Darl Householder notified on pt.'s low pottasium level .

## 2016-03-22 NOTE — Clinical Social Work Note (Signed)
CSW acknowledges consult: "Patient just moved to [place] with multiple comorbidities needing to be set up with physicians here and question ability to obtain medications." Will notify RNCM.  CSW signing off. Consult if any social work needs arise.  Dayton Scrape, [place]

## 2016-03-23 DIAGNOSIS — Z794 Long term (current) use of insulin: Secondary | ICD-10-CM

## 2016-03-23 DIAGNOSIS — D638 Anemia in other chronic diseases classified elsewhere: Secondary | ICD-10-CM

## 2016-03-23 DIAGNOSIS — L299 Pruritus, unspecified: Secondary | ICD-10-CM

## 2016-03-23 DIAGNOSIS — I482 Chronic atrial fibrillation: Secondary | ICD-10-CM | POA: Diagnosis not present

## 2016-03-23 DIAGNOSIS — E876 Hypokalemia: Secondary | ICD-10-CM | POA: Diagnosis not present

## 2016-03-23 DIAGNOSIS — I5022 Chronic systolic (congestive) heart failure: Secondary | ICD-10-CM | POA: Diagnosis not present

## 2016-03-23 DIAGNOSIS — E118 Type 2 diabetes mellitus with unspecified complications: Secondary | ICD-10-CM

## 2016-03-23 LAB — BASIC METABOLIC PANEL
Anion gap: 12 (ref 5–15)
BUN: 74 mg/dL — AB (ref 6–20)
CALCIUM: 8.2 mg/dL — AB (ref 8.9–10.3)
CO2: 24 mmol/L (ref 22–32)
CREATININE: 2.72 mg/dL — AB (ref 0.44–1.00)
Chloride: 97 mmol/L — ABNORMAL LOW (ref 101–111)
GFR, EST AFRICAN AMERICAN: 19 mL/min — AB (ref >60)
GFR, EST NON AFRICAN AMERICAN: 16 mL/min — AB (ref >60)
Glucose, Bld: 156 mg/dL — ABNORMAL HIGH (ref 65–99)
Potassium: 3.8 mmol/L (ref 3.5–5.1)
SODIUM: 133 mmol/L — AB (ref 135–145)

## 2016-03-23 LAB — CBC
HCT: 29.8 % — ABNORMAL LOW (ref 36.0–46.0)
Hemoglobin: 9.3 g/dL — ABNORMAL LOW (ref 12.0–15.0)
MCH: 28 pg (ref 26.0–34.0)
MCHC: 31.2 g/dL (ref 30.0–36.0)
MCV: 89.8 fL (ref 78.0–100.0)
PLATELETS: 287 10*3/uL (ref 150–400)
RBC: 3.32 MIL/uL — AB (ref 3.87–5.11)
RDW: 17.2 % — ABNORMAL HIGH (ref 11.5–15.5)
WBC: 7.2 10*3/uL (ref 4.0–10.5)

## 2016-03-23 LAB — GLUCOSE, CAPILLARY
GLUCOSE-CAPILLARY: 90 mg/dL (ref 65–99)
GLUCOSE-CAPILLARY: 189 mg/dL — AB (ref 65–99)
GLUCOSE-CAPILLARY: 227 mg/dL — AB (ref 65–99)
Glucose-Capillary: 204 mg/dL — ABNORMAL HIGH (ref 65–99)

## 2016-03-23 MED ORDER — GUAIFENESIN-DM 100-10 MG/5ML PO SYRP
5.0000 mL | ORAL_SOLUTION | ORAL | Status: DC | PRN
  Administered 2016-03-23 – 2016-03-24 (×3): 5 mL via ORAL
  Filled 2016-03-23 (×3): qty 5

## 2016-03-23 NOTE — Progress Notes (Signed)
OT Cancellation Note  Patient Details Name: Cassandra Bonilla MRN: [id number] DOB: 01-Feb-1941   Cancelled Treatment:    Reason Eval/Treat Not Completed: Patient at procedure or test/ unavailable. x3 attempts to see pt today, pt currently out of the room. Will attempt to see pt tomorrow to complete OT evaluation.  Redmond Baseman, OTR/L Pager: [phone] 03/23/2016, 2:58 PM

## 2016-03-23 NOTE — Progress Notes (Signed)
Progress Note    Cassandra Bonilla  [id number] DOB: [birth year]  DOA: 03/22/2016 PCP: No primary care provider on file.    Brief Narrative:   Cassandra Bonilla is an 75 y.o. female with extensive past medical history significant of CAD s/p CABG, HTN, IDDM, systolic CHF last echo in 01/2016 EF noted to be 40-45% with severe pulmonary hypertension, CKD stage III-IV, chronic A. Fib; who presents with complaints of itching all over.  Assessment/Plan:   Principal Problem:   Hypokalemia Active Problems:   Pruritus   Anemia of chronic disease   Chronic atrial fibrillation (HCC)   Diabetes mellitus type 2 with complications (HCC)   Chronic systolic congestive heart failure (HCC)   Acute renal failure superimposed on stage 3 chronic kidney disease (HCC)  Generalized Pruritis over the span of 5 to 6 weeks: Unclear etiology.  Bilirubin is normal.  Elevated alk phos.  No rash seen.  Relief with atarax.    Anemia of chronic disease: No further drop in hemoglobin, currently awaiting records from [place] where she underwent further work up for GI bleed.  Anemia panel shows low normal iron.  Iron supplements will be added.   Chronic atrial fibrilllation:  Rate controlled and off anticoagulation.    Diabetes mellitus: CBG (last 3)   Recent Labs  03/23/16 0603 03/23/16 1112 03/23/16 1601  GLUCAP 90 189* 204*    Resume SSI. Resume lantus  NO CHANGES.   Chronic systolic heart failure: No signs of overt failure.  Compensated, resume home meds.    Stage 4 CKD: Stable.   Hypokalemia: repleted and repeat level is normal.      Family Communication/Anticipated D/C date and plan/Code Status   DVT prophylaxis: SCD'S Code Status: Full Code.  Family Communication: none at bedside.  Disposition Plan: SNF when bed available.    Medical Consultants:    None.   Procedures:   none Anti-Infectives:   Anti-infectives    None      Subjective:    Cassandra Bonilla reports  itching is better. Wants something for cough. None at bedside today.   Objective:    Filed Vitals:   03/22/16 0342 03/22/16 2132 03/23/16 0605 03/23/16 0849  BP: 144/76 135/83 131/74 132/77  Pulse: 89 92 98   Temp: 98.1 F (36.7 C) 98.4 F (36.9 C) 98.4 F (36.9 C)   TempSrc: Oral Oral Oral   Resp: _0 Height:      Weight: 67.586 kg (149 lb)     SpO2: 98% 94% 96%     Intake/Output Summary (Last 24 hours) at 03/23/16 1714 Last data filed at 03/22/16 1900  Gross per 24 hour  Intake    220 ml  Output      0 ml  Net    220 ml   Filed Weights   03/21/16 2209 03/22/16 0342  Weight: 67.728 kg (149 lb 5 oz) 67.586 kg (149 lb)    Exam: General exam: Appears calm and comfortable. No acute distress.  Respiratory system: Clear to auscultation. Respiratory effort normal. Cardiovascular system: S1 & S2 heard, RRR. No JVD,  rubs, gallops or clicks. No murmurs. Gastrointestinal system: Abdomen is nondistended, soft and nontender. No organomegaly or masses felt. Normal bowel sounds heard. Central nervous system: Alert and oriented. No focal neurological deficits. Extremities: No clubbing, edema, or cyanosis. Skin: No rashes, lesions or ulcers Psychiatry: Judgement and insight appear normal. Mood & affect appropriate.   Data Reviewed:   I  have personally reviewed following labs and imaging studies:  Labs: Basic Metabolic Panel:  Recent Labs Lab 03/21/16 2247 03/22/16 0206 03/22/16 0855 03/23/16 1019  NA 130*  --  130* 133*  K 2.4*  --  3.3* 3.8  CL 90*  --  93* 97*  CO2 27  --  25 24  GLUCOSE 256*  --  220* 156*  BUN 77*  --  76* 74*  CREATININE 2.97*  --  2.85* 2.72*  CALCIUM 7.8*  --  7.6* 8.2*  MG  --  1.7  --   --    GFR Estimated Creatinine Clearance: 16.4 mL/min (by C-G formula based on Cr of 2.72). Liver Function Tests:  Recent Labs Lab 03/21/16 2247 03/22/16 0855  AST 32 32  ALT 21 20  ALKPHOS 286* 273*  BILITOT 1.1 1.1  PROT 7.2 6.8  ALBUMIN  2.5* 2.4*   No results for input(s): LIPASE, AMYLASE in the last 168 hours. No results for input(s): AMMONIA in the last 168 hours. Coagulation profile No results for input(s): INR, PROTIME in the last 168 hours.  CBC:  Recent Labs Lab 03/21/16 2247 03/22/16 0855 03/23/16 1019  WBC 6.1 6.9 7.2  NEUTROABS 4.5 4.9  --   HGB 8.6* 8.5* 9.3*  HCT 27.8* 27.4* 29.8*  MCV 91.7 90.4 89.8  PLT 270 266 287   Cardiac Enzymes: No results for input(s): CKTOTAL, CKMB, CKMBINDEX, TROPONINI in the last 168 hours. BNP (last 3 results) No results for input(s): PROBNP in the last 8760 hours. CBG:  Recent Labs Lab 03/22/16 1614 03/22/16 2128 03/23/16 0603 03/23/16 1112 03/23/16 1601  GLUCAP 155* 158* 90 189* 204*   D-Dimer: No results for input(s): DDIMER in the last 72 hours. Hgb A1c: No results for input(s): HGBA1C in the last 72 hours. Lipid Profile: No results for input(s): CHOL, HDL, LDLCALC, TRIG, CHOLHDL, LDLDIRECT in the last 72 hours. Thyroid function studies: No results for input(s): TSH, T4TOTAL, T3FREE, THYROIDAB in the last 72 hours.  Invalid input(s): FREET3 Anemia work up:  Recent Labs  03/22/16 0855  TIBC 440  IRON 36   Sepsis Labs:  Recent Labs Lab 03/21/16 2247 03/22/16 0855 03/23/16 1019  WBC 6.1 6.9 7.2   Urine analysis:    Component Value Date/Time   COLORURINE YELLOW 03/22/2016 0100   APPEARANCEUR CLEAR 03/22/2016 0100   LABSPEC 1.013 03/22/2016 0100   PHURINE 6.5 03/22/2016 0100   GLUCOSEU NEGATIVE 03/22/2016 0100   HGBUR NEGATIVE 03/22/2016 0100   BILIRUBINUR NEGATIVE 03/22/2016 0100   KETONESUR NEGATIVE 03/22/2016 0100   PROTEINUR 100* 03/22/2016 0100   NITRITE NEGATIVE 03/22/2016 0100   LEUKOCYTESUR NEGATIVE 03/22/2016 0100   Microbiology No results found for this or any previous visit (from the past 240 hour(s)).  Radiology: Dg Chest Port 1 View  03/22/2016  CLINICAL DATA:  COPD, CHF, coronary artery disease, pruritis. EXAM:  PORTABLE CHEST 1 VIEW COMPARISON:  None in PACs FINDINGS: The lungs are adequately inflated. The interstitial markings are mildly prominent on the right and at the left lung base. There is blunting of the left lateral costophrenic angle. A small amount of pleural calcification is noted over the dome of the left hemidiaphragm. The cardiac silhouette is enlarged. The central pulmonary vascularity is prominent. There are post CABG changes. There is calcification in the wall of the aortic arch. The observed bony thorax exhibits no acute abnormality. IMPRESSION: Findings consistent with low-grade CHF.  No alveolar pneumonia. Electronically Signed   By: Shanon Brow  [place] M.D.   On: 03/22/2016 07:19    Medications:   . amLODipine  10 mg Oral Daily  . insulin aspart  0-5 Units Subcutaneous QHS  . insulin aspart  0-9 Units Subcutaneous TID WC  . insulin glargine  10 Units Subcutaneous Daily  . pantoprazole  40 mg Oral BID  . sodium chloride flush  3 mL Intravenous Q12H   Continuous Infusions: . sodium chloride 75 mL/hr at 03/22/16 0557    Time spent: 25 minutes.      Essam Lowdermilk  [facility] [phone]   *Please refer to [email or web address], password [email or web address] to get updated schedule on who will round on this patient, as hospitalists switch teams weekly. If 7PM-7AM, please contact night-coverage at [email or web address], password [email or web address] for any overnight needs.  03/23/2016, 5:14 PM

## 2016-03-24 DIAGNOSIS — I5022 Chronic systolic (congestive) heart failure: Secondary | ICD-10-CM | POA: Diagnosis not present

## 2016-03-24 DIAGNOSIS — E876 Hypokalemia: Secondary | ICD-10-CM | POA: Diagnosis not present

## 2016-03-24 DIAGNOSIS — D638 Anemia in other chronic diseases classified elsewhere: Secondary | ICD-10-CM | POA: Diagnosis not present

## 2016-03-24 DIAGNOSIS — I482 Chronic atrial fibrillation: Secondary | ICD-10-CM | POA: Diagnosis not present

## 2016-03-24 LAB — CBC
HCT: 26.8 % — ABNORMAL LOW (ref 36.0–46.0)
HEMOGLOBIN: 8.3 g/dL — AB (ref 12.0–15.0)
MCH: 27.6 pg (ref 26.0–34.0)
MCHC: 31 g/dL (ref 30.0–36.0)
MCV: 89 fL (ref 78.0–100.0)
Platelets: 290 10*3/uL (ref 150–400)
RBC: 3.01 MIL/uL — AB (ref 3.87–5.11)
RDW: 17.2 % — ABNORMAL HIGH (ref 11.5–15.5)
WBC: 6.3 10*3/uL (ref 4.0–10.5)

## 2016-03-24 LAB — GLUCOSE, CAPILLARY
Glucose-Capillary: 135 mg/dL — ABNORMAL HIGH (ref 65–99)
Glucose-Capillary: 134 mg/dL — ABNORMAL HIGH (ref 65–99)
Glucose-Capillary: 226 mg/dL — ABNORMAL HIGH (ref 65–99)

## 2016-03-24 MED ORDER — GABAPENTIN 300 MG PO CAPS: 300.0000 mg | ORAL_CAPSULE | Freq: Every day | ORAL | Status: DC | PRN

## 2016-03-24 MED ORDER — FERROUS SULFATE 325 (65 FE) MG PO TABS: 325.0000 mg | ORAL_TABLET | Freq: Two times a day (BID) | ORAL | Status: DC

## 2016-03-24 MED ORDER — GUAIFENESIN-DM 100-10 MG/5ML PO SYRP: 5.0000 mL | ORAL_SOLUTION | ORAL | Status: DC | PRN

## 2016-03-24 MED ORDER — HYDROXYZINE HCL 25 MG PO TABS: 25.0000 mg | ORAL_TABLET | Freq: Four times a day (QID) | ORAL | Status: AC | PRN

## 2016-03-24 MED ORDER — FERROUS SULFATE 325 (65 FE) MG PO TABS
325.0000 mg | ORAL_TABLET | Freq: Two times a day (BID) | ORAL | Status: DC
Start: 2016-03-24 — End: 2016-03-24
  Administered 2016-03-24: 325 mg via ORAL
  Filled 2016-03-24: qty 1

## 2016-03-24 MED ORDER — HYDROXYZINE HCL 25 MG PO TABS: 25.0000 mg | ORAL_TABLET | Freq: Four times a day (QID) | ORAL | Status: DC | PRN

## 2016-03-24 MED ORDER — FUROSEMIDE 80 MG PO TABS: 80.0000 mg | ORAL_TABLET | Freq: Two times a day (BID) | ORAL | Status: DC

## 2016-03-24 NOTE — Clinical Social Work Placement (Signed)
   CLINICAL SOCIAL WORK PLACEMENT  NOTE  Date:  03/24/2016  Patient Details  Name: Cassandra Bonilla MRN: [id number] Date of Birth: Nov 09, 1940  Clinical Social Work is seeking post-discharge placement for this patient at the [place] level of care (*CSW will initial, date and re-position this form in  chart as items are completed):  Yes   Patient/family provided with [place] Work Department's list of facilities offering this level of care within the geographic area requested by the patient (or if unable, by the patient's family).  Yes   Patient/family informed of their freedom to choose among providers that offer the needed level of care, that participate in Medicare, Medicaid or managed care program needed by the patient, have an available bed and are willing to accept the patient.  Yes   Patient/family informed of [facility]'s ownership interest in [facility] and [facility], as well as of the fact that they are under no obligation to receive care at these facilities.  PASRR submitted to EDS on 03/22/16     PASRR number received on 03/22/16     Existing PASRR number confirmed on       FL2 transmitted to all facilities in geographic area requested by pt/family on 03/22/16     FL2 transmitted to all facilities within larger geographic area on       Patient informed that his/her managed care company has contracts with or will negotiate with certain facilities, including the following:        Yes   Patient/family informed of bed offers received.  Patient chooses bed at [facility] and [place] recommends and patient chooses bed at      Patient to be transferred to [facility] on 03/24/16.  Patient to be transferred to facility by Family     Patient family notified on 03/24/16 of transfer.  Name of family member notified:  Husband at bedside     PHYSICIAN Please sign FL2     Additional Comment:     _______________________________________________ Boone Master, LCSW 03/24/2016, 4:01 PM

## 2016-03-24 NOTE — Progress Notes (Signed)
Report called to [facility]. Given to [employer] the nurse. Husband will be transporting his wife over there. IV was dc'd earlier by the patient.

## 2016-03-24 NOTE — Progress Notes (Signed)
Clinical Social Work  Per [employer], pt ready to DC today. CSW met with pt and explained that [facility] is the only bed offer at this time. Pt agreeable to DC to [facility]. CSW offered to call family but pt reports she will update them. CSW informed MD of DC plans and will DC pt once DC summary is complete.  Sindy Messing, [place] Weekend Coverage [phone]

## 2016-03-24 NOTE — Progress Notes (Signed)
Occupational Therapy Evaluation Patient Details Name: Cassandra Bonilla MRN: [id number] DOB: July 07, 1941 Today's Date: 03/24/2016    History of Present Illness Pt is a 75 y/o female who presents with "itching all over" for 4-6 weeks. Pt lives at home with husband and other family, and reports recent falls and LE swelling. PMH includes CAD s/p CABG, HTN, systolic CHF last echo in 01/2016 EF noted to be 40-45% with severe pulmonary hypertension, CKD stage III-IV, chronic A. Fib.   Clinical Impression   PTA, pt was independent with ADLs and mobility. Pt currently requires min assist for LB ADLs and min guard assist for basic transfers. Pt will benefit from continued acute OT to increase independence and safety with ADLs and mobility to allow for safe discharge to the venue listed below. Recommend SNF for post-acute rehab stay to maximize independence and decrease caregiver burden. Will continue to follow acutely.    Follow Up Recommendations  SNF;Supervision/Assistance - 24 hour    Equipment Recommendations  3 in 1 bedside comode;Other (comment) (RW-2 wheeled)    Recommendations for Other Services       Precautions / Restrictions Precautions Precautions: Fall Restrictions Weight Bearing Restrictions: No      Mobility Bed Mobility Overal bed mobility: Needs Assistance Bed Mobility: Supine to Sit;Sit to Supine     Supine to sit: Min guard Sit to supine: Min guard   General bed mobility comments: Min guard assist for safety. HOB flat, no use of bedrails. Pt able to reposition self in bed without physical assistance.  Transfers Overall transfer level: Needs assistance Equipment used: Rolling walker (2 wheeled) Transfers: Sit to/from Stand Sit to Stand: Min guard         General transfer comment: Min guard assist for safety and balance. VCs for safe hand placement.    Balance Overall balance assessment: Needs assistance Sitting-balance support: No upper extremity supported;Feet  supported Sitting balance-Leahy Scale: Good     Standing balance support: Bilateral upper extremity supported;During functional activity Standing balance-Leahy Scale: Poor Standing balance comment: reliant on UE support to maintain balance                            ADL Overall ADL's : Needs assistance/impaired     Grooming: Wash/dry hands;Standing;Supervision/safety   Upper Body Bathing: Set up;Sitting   Lower Body Bathing: Minimal assistance;Sit to/from stand   Upper Body Dressing : Set up;Sitting   Lower Body Dressing: Minimal assistance;Sit to/from stand   Toilet Transfer: Min guard;Cueing for safety;Ambulation;BSC;RW   Toileting- Clothing Manipulation and Hygiene: Minimal assistance;Sit to/from stand Toileting - Clothing Manipulation Details (indicate cue type and reason): assist for thorough pericare     Functional mobility during ADLs: Min guard;Rolling walker       Vision Vision Assessment?: No apparent visual deficits   Perception     Praxis      Pertinent Vitals/Pain Pain Assessment: No/denies pain     Hand Dominance Right   Extremity/Trunk Assessment Upper Extremity Assessment Upper Extremity Assessment: Generalized weakness   Lower Extremity Assessment Lower Extremity Assessment: Generalized weakness   Cervical / Trunk Assessment Cervical / Trunk Assessment: Kyphotic   Communication Communication Communication: No difficulties   Cognition Arousal/Alertness: Awake/alert Behavior During Therapy: WFL for tasks assessed/performed Overall Cognitive Status: Within Functional Limits for tasks assessed                     General Comments  Exercises       Shoulder Instructions      Home Living Family/patient expects to be discharged to:: Private residence Living Arrangements: Spouse/significant other;Children (daughter) Available Help at Discharge: Family;Available 24 hours/day Type of Home: House Home Access:  Stairs to enter [employer] of Steps: 6 (questionable) Entrance Stairs-Rails: Right;Left Home Layout: One level     Bathroom Shower/Tub: Tub/shower unit Shower/tub characteristics: Architectural technologist: Standard     Home Equipment: None          Prior Functioning/Environment Level of Independence: Independent        Comments: Recently moved from [place]    OT Diagnosis: Generalized weakness   OT Problem List: Decreased activity tolerance;Impaired balance (sitting and/or standing);Decreased safety awareness;Decreased knowledge of use of DME or AE;Obesity;Cardiopulmonary status limiting activity   OT Treatment/Interventions: Self-care/ADL training;Therapeutic exercise;Energy conservation;DME and/or AE instruction;Therapeutic activities;Patient/family education;Balance training    OT Goals(Current goals can be found in the care plan section) Acute Rehab OT Goals Patient Stated Goal: Get better OT Goal Formulation: With patient Time For Goal Achievement: 04/07/16 Potential to Achieve Goals: Good ADL Goals Pt Will Perform Grooming: with modified independence;standing Pt Will Perform Upper Body Bathing: with modified independence;sitting Pt Will Perform Lower Body Bathing: with modified independence;sit to/from stand Pt Will Transfer to Toilet: with modified independence;ambulating;bedside commode (over toilet) Pt Will Perform Toileting - Clothing Manipulation and hygiene: with modified independence;sit to/from stand  OT Frequency: Min 2X/week   Barriers to D/C:            Co-evaluation              End of Session Equipment Utilized During Treatment: Gait belt;Rolling walker Nurse Communication: Mobility status  Activity Tolerance: Patient limited by fatigue Patient left: in bed;with call bell/phone within reach;with bed alarm set   Time: [id number] OT Time Calculation (min): 24 min Charges:  OT General Charges $OT Visit: 1 Procedure OT  Evaluation $OT Eval Moderate Complexity: 1 Procedure OT Treatments $Self Care/Home Management : 8-22 mins G-Codes: OT G-codes **NOT FOR INPATIENT CLASS** Functional Assessment Tool Used: clinical judgement Functional Limitation: Self care Self Care Current Status [id number]): At least 1 percent but less than 20 percent impaired, limited or restricted Self Care Goal Status [id number]): At least 1 percent but less than 20 percent impaired, limited or restricted  Redmond Baseman, OTR/L Pager: [phone] 03/24/2016, 2:33 PM

## 2016-03-24 NOTE — Progress Notes (Signed)
Clinical Social Work  SNF has received DC summary and agreeable to accept pt. RN to call report. CSW met with husband and pt at bedside and husband will transport pt to SNF. RN to give DC packet to husband at [name] is signing off but available if needed.  Sindy Messing, LCSW  Weekend Coverage

## 2016-03-24 NOTE — Progress Notes (Signed)
Progress Note    Cassandra Bonilla  [id number]:[id number] DOB: 01/23/1941  DOA: 03/22/2016 PCP: No primary care provider on file.    Brief Narrative:   Cassandra Bonilla is an 75 y.o. female with extensive past medical history significant of CAD s/p CABG, HTN, IDDM, systolic CHF last echo in 01/2016 EF noted to be 40-45% with severe pulmonary hypertension, CKD stage III-IV, chronic A. Fib; who presents with complaints of itching all over.  7/9 pruritis has resolved. Currently waiting for a bed at SNF.   Assessment/Plan:   Principal Problem:   Hypokalemia Active Problems:   Pruritus   Anemia of chronic disease   Chronic atrial fibrillation (HCC)   Diabetes mellitus type 2 with complications (HCC)   Chronic systolic congestive heart failure (HCC)   Acute renal failure superimposed on stage 3 chronic kidney disease (HCC)  Generalized Pruritis over the span of 5 to 6 weeks: Unclear etiology.  Bilirubin is normal.  Elevated alk phos.  No rash seen.  Relief with atarax.    Anemia of chronic disease: No further drop in hemoglobin, currently awaiting records from [place] where she underwent further work up for GI bleed.  Anemia panel shows low normal iron.  Iron supplements will be added.  Hemoglobin stable at this point.  Will recommend outpatient follow up with gastroenterology.   Chronic atrial fibrilllation:  Rate controlled and off anticoagulation because of GI bleed.     Diabetes mellitus: CBG (last 3)   Recent Labs  03/23/16 2230 03/24/16 0607 03/24/16 1150  GLUCAP 227* 135* 134*    Resume SSI. Resume lantus  NO CHANGES.   Chronic systolic heart failure: No signs of overt failure.  Compensated, resume home meds.    Stage 4 CKD: Stable.   Hypokalemia: repleted and repeat level is normal.      Family Communication/Anticipated D/C date and plan/Code Status   DVT prophylaxis: SCD'S Code Status: Full Code.  Family Communication: husband at bedside.  Disposition  Plan: SNF when bed available.    Medical Consultants:    None.   Procedures:   none Anti-Infectives:   Anti-infectives    None      Subjective:    Cassandra Bonilla reports itching is gone, sitting up and eating breakfast.  Husband at bedside.   Objective:    Filed Vitals:   03/23/16 0849 03/23/16 2124 03/24/16 0503 03/24/16 1027  BP: 132/77 132/57 117/61 127/72  Pulse:  106 95   Temp:  99 F (37.2 C) 99 F (37.2 C)   TempSrc:  Oral Oral   Resp:  20 18   Height:      Weight:      SpO2:  100% 99%     Intake/Output Summary (Last 24 hours) at 03/24/16 1255 Last data filed at 03/23/16 1719  Gross per 24 hour  Intake    222 ml  Output      0 ml  Net    222 ml   Filed Weights   03/21/16 2209 03/22/16 0342  Weight: 67.728 kg (149 lb 5 oz) 67.586 kg (149 lb)    Exam: General exam: Appears calm and comfortable. No acute distress.  Respiratory system: Clear to auscultation. Respiratory effort normal. Cardiovascular system: S1 & S2 heard, RRR. No JVD,  rubs, gallops or clicks. No murmurs. Gastrointestinal system: Abdomen is nondistended, soft and nontender. No organomegaly or masses felt. Normal bowel sounds heard. Central nervous system: Alert and oriented. No focal neurological deficits. Extremities: No  clubbing, edema, or cyanosis. Skin: No rashes, lesions or ulcers Psychiatry: Judgement and insight appear normal. Mood & affect appropriate.   Data Reviewed:   I have personally reviewed following labs and imaging studies:  Labs: Basic Metabolic Panel:  Recent Labs Lab 03/21/16 2247 03/22/16 0206 03/22/16 0855 03/23/16 1019  NA 130*  --  130* 133*  K 2.4*  --  3.3* 3.8  CL 90*  --  93* 97*  CO2 27  --  25 24  GLUCOSE 256*  --  220* 156*  BUN 77*  --  76* 74*  CREATININE 2.97*  --  2.85* 2.72*  CALCIUM 7.8*  --  7.6* 8.2*  MG  --  1.7  --   --    GFR Estimated Creatinine Clearance: 16.4 mL/min (by C-G formula based on Cr of 2.72). Liver Function  Tests:  Recent Labs Lab 03/21/16 2247 03/22/16 0855  AST 32 32  ALT 21 20  ALKPHOS 286* 273*  BILITOT 1.1 1.1  PROT 7.2 6.8  ALBUMIN 2.5* 2.4*   No results for input(s): LIPASE, AMYLASE in the last 168 hours. No results for input(s): AMMONIA in the last 168 hours. Coagulation profile No results for input(s): INR, PROTIME in the last 168 hours.  CBC:  Recent Labs Lab 03/21/16 2247 03/22/16 0855 03/23/16 1019  WBC 6.1 6.9 7.2  NEUTROABS 4.5 4.9  --   HGB 8.6* 8.5* 9.3*  HCT 27.8* 27.4* 29.8*  MCV 91.7 90.4 89.8  PLT 270 266 287   Cardiac Enzymes: No results for input(s): CKTOTAL, CKMB, CKMBINDEX, TROPONINI in the last 168 hours. BNP (last 3 results) No results for input(s): PROBNP in the last 8760 hours. CBG:  Recent Labs Lab 03/23/16 1112 03/23/16 1601 03/23/16 2230 03/24/16 0607 03/24/16 1150  GLUCAP 189* 204* 227* 135* 134*   D-Dimer: No results for input(s): DDIMER in the last 72 hours. Hgb A1c: No results for input(s): HGBA1C in the last 72 hours. Lipid Profile: No results for input(s): CHOL, HDL, LDLCALC, TRIG, CHOLHDL, LDLDIRECT in the last 72 hours. Thyroid function studies: No results for input(s): TSH, T4TOTAL, T3FREE, THYROIDAB in the last 72 hours.  Invalid input(s): FREET3 Anemia work up:  Recent Labs  03/22/16 0855  TIBC 440  IRON 36   Sepsis Labs:  Recent Labs Lab 03/21/16 2247 03/22/16 0855 03/23/16 1019  WBC 6.1 6.9 7.2   Urine analysis:    Component Value Date/Time   COLORURINE YELLOW 03/22/2016 0100   APPEARANCEUR CLEAR 03/22/2016 0100   LABSPEC 1.013 03/22/2016 0100   PHURINE 6.5 03/22/2016 0100   GLUCOSEU NEGATIVE 03/22/2016 0100   HGBUR NEGATIVE 03/22/2016 0100   BILIRUBINUR NEGATIVE 03/22/2016 0100   KETONESUR NEGATIVE 03/22/2016 0100   PROTEINUR 100* 03/22/2016 0100   NITRITE NEGATIVE 03/22/2016 0100   LEUKOCYTESUR NEGATIVE 03/22/2016 0100   Microbiology No results found for this or any previous visit  (from the past 240 hour(s)).  Radiology: No results found.  Medications:   . amLODipine  10 mg Oral Daily  . ferrous sulfate  325 mg Oral BID WC  . insulin aspart  0-5 Units Subcutaneous QHS  . insulin aspart  0-9 Units Subcutaneous TID WC  . insulin glargine  10 Units Subcutaneous Daily  . pantoprazole  40 mg Oral BID  . sodium chloride flush  3 mL Intravenous Q12H   Continuous Infusions:    Time spent: 25 minutes.      [facility]  [facility] [phone]   *Please refer  to [email or web address], password [email or web address] to get updated schedule on who will round on this patient, as hospitalists switch teams weekly. If 7PM-7AM, please contact night-coverage at [email or web address], password [email or web address] for any overnight needs.  03/24/2016, 12:55 PM

## 2016-03-24 NOTE — Discharge Summary (Addendum)
Physician Discharge Summary  [name] [id number]:[id number] DOB: [birth year] DOA: 03/22/2016  PCP: No primary care provider on file.  Admit date: 03/22/2016 Discharge date: 03/24/2016  Admitted From: Home  Disposition:  SNF.  Recommendations for Outpatient Follow-up:  1. Follow up with PCP in 1-2 weeks 2. Please obtain BMP/CBC in 2 days.  3. Please follow up with gastroenterology as outpatient for outpatient elective colonoscopy.  4. Please Follow up with nephrology for your stage 3 to 4 CKD.   Home Health:yes Equipment/Devices:3 in 1 bedside commode.  Discharge Condition:guarded.  CODE STATUS:full code.  Diet recommendation: Heart Healthy / Carb Modified /  Brief/Interim Summary: Cassandra Bonilla is an 75 y.o. female with extensive past medical history significant of CAD s/p CABG, HTN, IDDM, systolic CHF last echo in 01/2016 EF noted to be 40-45% with severe pulmonary hypertension, CKD stage III-IV, chronic A. Fib; who presents with complaints of itching all over.  7/9 pruritis has resolved. Currently waiting for a bed at SNF.   Discharge Diagnoses:  Principal Problem:   Hypokalemia Active Problems:   Pruritus   Anemia of chronic disease   Chronic atrial fibrillation (HCC)   Diabetes mellitus type 2 with complications (HCC)   Chronic systolic congestive heart failure (HCC)   Acute renal failure superimposed on stage 3 chronic kidney disease (HCC)  Generalized Pruritis over the span of 5 to 6 weeks: Unclear etiology.  Bilirubin is normal.  Elevated alk phos.  No rash seen. please follow up with PCP if symptoms re occur.  Relief with atarax.    Anemia of chronic disease: No further drop in hemoglobin, . Anemia panel shows low normal iron.  Iron supplements will be added.  Hemoglobin stable at this point.  Will recommend outpatient follow up with gastroenterology for colonoscopy.   Chronic atrial fibrilllation:  Rate controlled and off anticoagulation because of GI bleed.      Diabetes mellitus: CBG (last 3)   Recent Labs (last 2 labs)      Recent Labs  03/23/16 2230 03/24/16 0607 03/24/16 1150  GLUCAP 227* 135* 134*      Resume SSI. lantus was discontinued from last hospital visit.  Please follow up the hgba1c with PCP.   Chronic systolic heart failure: No signs of overt failure.  Compensated, resume home meds.    Stage 4 CKD: Stable. Her last creatinine is 1.9, recommended to hold the lasix for 5 days and repeat bmp in 2 days before resuming it . On metolazone for fluid as needed.   Hypokalemia: repleted and repeat level is normal.        Discharge Instructions  Discharge Instructions    Diet - low sodium heart healthy    Complete by:  As directed      Diet - low sodium heart healthy    Complete by:  As directed      Discharge instructions    Complete by:  As directed      Discharge instructions    Complete by:  As directed   Please follow up with Gastroenterology for outpatient colonoscopy.  Please follo wup with Nephrology for management of your stage 3 to 4 CKD.            Medication List    TAKE these medications        allopurinol 100 MG tablet  Commonly known as:  ZYLOPRIM  Take 100 mg by mouth daily.     amLODipine 10 MG tablet  Commonly known as:  NORVASC  Take 10 mg by mouth daily.     aspirin EC 81 MG tablet  Take 81 mg by mouth daily.     atorvastatin 40 MG tablet  Commonly known as:  LIPITOR  Take 40 mg by mouth daily.     ferrous sulfate 325 (65 FE) MG tablet  Take 1 tablet (325 mg total) by mouth 2 (two) times daily with a meal.     furosemide 80 MG tablet  Commonly known as:  LASIX  Take 1 tablet (80 mg total) by mouth 2 (two) times daily.  Start taking on:  03/31/2016     gabapentin 300 MG capsule  Commonly known as:  NEURONTIN  Take 1 capsule (300 mg total) by mouth daily as needed (itching).     guaiFENesin-dextromethorphan 100-10 MG/5ML syrup  Commonly known as:  ROBITUSSIN  DM  Take 5 mLs by mouth every 4 (four) hours as needed for cough.     hydrOXYzine 25 MG tablet  Commonly known as:  ATARAX/VISTARIL  Take 1-2 tablets (25-50 mg total) by mouth every 6 (six) hours as needed for itching.     insulin lispro 100 UNIT/ML injection  Commonly known as:  HUMALOG  Inject 8-10 Units into the skin 2 (two) times daily with a meal. Takes 8 units if CBG <400, takes 10 units if CBG >400     metolazone 5 MG tablet  Commonly known as:  ZAROXOLYN  Take 5 mg by mouth as needed (fluid overload).     pantoprazole 40 MG tablet  Commonly known as:  PROTONIX  Take 40 mg by mouth 2 (two) times daily.           Follow-up Information    Follow up with [facility].   Contact information:   Please call [phone]  for assistance with finding [facility] doctor that accepts current insurance      Schedule an appointment as soon as possible for a visit with [facility].   Specialty:  Gastroenterology   Why:  for outpatient colonoscopy.    Contact information:   [address] [place] [address] [phone]      Follow up with [facility]. Schedule an appointment as soon as possible for a visit in 1 week.   Why:  for stage 3 CKD.    Contact information:   [place] [address] [phone]      No Known Allergies  Consultations:  none   Procedures/Studies: Dg Chest Port 1 View  03/22/2016  CLINICAL DATA:  COPD, CHF, coronary artery disease, pruritis. EXAM: PORTABLE CHEST 1 VIEW COMPARISON:  None in PACs FINDINGS: The lungs are adequately inflated. The interstitial markings are mildly prominent on the right and at the left lung base. There is blunting of the left lateral costophrenic angle. A small amount of pleural calcification is noted over the dome of the left hemidiaphragm. The cardiac silhouette is enlarged. The central pulmonary vascularity is prominent. There are post CABG changes. There is calcification  in the wall of the aortic arch. The observed bony thorax exhibits no acute abnormality. IMPRESSION: Findings consistent with low-grade CHF.  No alveolar pneumonia. Electronically Signed   By: David  Martinique M.D.   On: 03/22/2016 07:19       Subjective: Reports itching is better.   Discharge Exam: Filed Vitals:   03/24/16 1027 03/24/16 1439  BP: 127/72 122/72  Pulse:  91  Temp:  97.6 F (36.4 C)  Resp:  91  Filed Vitals:   03/23/16 2124 03/24/16 0503 03/24/16 1027 03/24/16 1439  BP: 132/57 117/61 127/72 122/72  Pulse: 106 95  91  Temp: 99 F (37.2 C) 99 F (37.2 C)  97.6 F (36.4 C)  TempSrc: Oral Oral  Oral  Resp: 20 18  91  Height:      Weight:      SpO2: 100% 99%  99%    General: Pt is alert, awake, not in acute distress Cardiovascular: RRR, S1/S2 +, no rubs, no gallops Respiratory: CTA bilaterally, no wheezing, no rhonchi Abdominal: Soft, NT, ND, bowel sounds + Extremities: no edema, no cyanosis    The results of significant diagnostics from this hospitalization (including imaging, microbiology, ancillary and laboratory) are listed below for reference.     Microbiology: No results found for this or any previous visit (from the past 240 hour(s)).   Labs: BNP (last 3 results) No results for input(s): BNP in the last 8760 hours. Basic Metabolic Panel:  Recent Labs Lab 03/21/16 2247 03/22/16 0206 03/22/16 0855 03/23/16 1019  NA 130*  --  130* 133*  K 2.4*  --  3.3* 3.8  CL 90*  --  93* 97*  CO2 27  --  25 24  GLUCOSE 256*  --  220* 156*  BUN 77*  --  76* 74*  CREATININE 2.97*  --  2.85* 2.72*  CALCIUM 7.8*  --  7.6* 8.2*  MG  --  1.7  --   --    Liver Function Tests:  Recent Labs Lab 03/21/16 2247 03/22/16 0855  AST 32 32  ALT 21 20  ALKPHOS 286* 273*  BILITOT 1.1 1.1  PROT 7.2 6.8  ALBUMIN 2.5* 2.4*   No results for input(s): LIPASE, AMYLASE in the last 168 hours. No results for input(s): AMMONIA in the last 168 hours. CBC:  Recent  Labs Lab 03/21/16 2247 03/22/16 0855 03/23/16 1019 03/24/16 1202  WBC 6.1 6.9 7.2 6.3  NEUTROABS 4.5 4.9  --   --   HGB 8.6* 8.5* 9.3* 8.3*  HCT 27.8* 27.4* 29.8* 26.8*  MCV 91.7 90.4 89.8 89.0  PLT 270 266 287 290   Cardiac Enzymes: No results for input(s): CKTOTAL, CKMB, CKMBINDEX, TROPONINI in the last 168 hours. BNP: Invalid input(s): POCBNP CBG:  Recent Labs Lab 03/23/16 1112 03/23/16 1601 03/23/16 2230 03/24/16 0607 03/24/16 1150  GLUCAP 189* 204* 227* 135* 134*   D-Dimer No results for input(s): DDIMER in the last 72 hours. Hgb A1c No results for input(s): HGBA1C in the last 72 hours. Lipid Profile No results for input(s): CHOL, HDL, LDLCALC, TRIG, CHOLHDL, LDLDIRECT in the last 72 hours. Thyroid function studies No results for input(s): TSH, T4TOTAL, T3FREE, THYROIDAB in the last 72 hours.  Invalid input(s): FREET3 Anemia work up  Recent Labs  03/22/16 0855  TIBC 440  IRON 36   Urinalysis    Component Value Date/Time   COLORURINE YELLOW 03/22/2016 0100   APPEARANCEUR CLEAR 03/22/2016 0100   LABSPEC 1.013 03/22/2016 0100   PHURINE 6.5 03/22/2016 0100   GLUCOSEU NEGATIVE 03/22/2016 0100   HGBUR NEGATIVE 03/22/2016 0100   BILIRUBINUR NEGATIVE 03/22/2016 0100   KETONESUR NEGATIVE 03/22/2016 0100   PROTEINUR 100* 03/22/2016 0100   NITRITE NEGATIVE 03/22/2016 0100   LEUKOCYTESUR NEGATIVE 03/22/2016 0100   Sepsis Labs Invalid input(s): PROCALCITONIN,  WBC,  LACTICIDVEN Microbiology No results found for this or any previous visit (from the past 240 hour(s)).   Time coordinating discharge: Over 30 minutes  SIGNEDHosie Poisson, MD  [facility] 03/24/2016, 3:09 PM Pager [phone]  If 7PM-7AM, please contact night-coverage [email or web address] Password [email or web address]

## 2016-03-26 ENCOUNTER — Encounter: Payer: Self-pay | Admitting: Gastroenterology

## 2016-04-16 DIAGNOSIS — I10 Essential (primary) hypertension: Secondary | ICD-10-CM | POA: Diagnosis not present

## 2016-04-16 DIAGNOSIS — E8770 Fluid overload, unspecified: Secondary | ICD-10-CM | POA: Diagnosis not present

## 2016-04-16 DIAGNOSIS — I5020 Unspecified systolic (congestive) heart failure: Secondary | ICD-10-CM | POA: Diagnosis not present

## 2016-04-16 DIAGNOSIS — N183 Chronic kidney disease, stage 3 (moderate): Secondary | ICD-10-CM | POA: Diagnosis not present

## 2016-04-18 DIAGNOSIS — Z7984 Long term (current) use of oral hypoglycemic drugs: Secondary | ICD-10-CM | POA: Diagnosis not present

## 2016-04-18 DIAGNOSIS — L8991 Pressure ulcer of unspecified site, stage 1: Secondary | ICD-10-CM | POA: Diagnosis not present

## 2016-04-18 DIAGNOSIS — R262 Difficulty in walking, not elsewhere classified: Secondary | ICD-10-CM | POA: Diagnosis not present

## 2016-04-18 DIAGNOSIS — I5022 Chronic systolic (congestive) heart failure: Secondary | ICD-10-CM | POA: Diagnosis not present

## 2016-04-18 DIAGNOSIS — E1129 Type 2 diabetes mellitus with other diabetic kidney complication: Secondary | ICD-10-CM | POA: Diagnosis not present

## 2016-04-18 DIAGNOSIS — E1122 Type 2 diabetes mellitus with diabetic chronic kidney disease: Secondary | ICD-10-CM | POA: Diagnosis not present

## 2016-04-18 DIAGNOSIS — N184 Chronic kidney disease, stage 4 (severe): Secondary | ICD-10-CM | POA: Diagnosis not present

## 2016-04-18 DIAGNOSIS — I130 Hypertensive heart and chronic kidney disease with heart failure and stage 1 through stage 4 chronic kidney disease, or unspecified chronic kidney disease: Secondary | ICD-10-CM | POA: Diagnosis not present

## 2016-04-18 DIAGNOSIS — I509 Heart failure, unspecified: Secondary | ICD-10-CM | POA: Diagnosis not present

## 2016-04-18 DIAGNOSIS — E785 Hyperlipidemia, unspecified: Secondary | ICD-10-CM | POA: Diagnosis not present

## 2016-04-18 DIAGNOSIS — M6281 Muscle weakness (generalized): Secondary | ICD-10-CM | POA: Diagnosis not present

## 2016-04-18 DIAGNOSIS — Z794 Long term (current) use of insulin: Secondary | ICD-10-CM | POA: Diagnosis not present

## 2016-04-18 DIAGNOSIS — Z9181 History of falling: Secondary | ICD-10-CM | POA: Diagnosis not present

## 2016-04-19 DIAGNOSIS — R262 Difficulty in walking, not elsewhere classified: Secondary | ICD-10-CM | POA: Diagnosis not present

## 2016-04-19 DIAGNOSIS — Z9181 History of falling: Secondary | ICD-10-CM | POA: Diagnosis not present

## 2016-04-19 DIAGNOSIS — I5022 Chronic systolic (congestive) heart failure: Secondary | ICD-10-CM | POA: Diagnosis not present

## 2016-04-19 DIAGNOSIS — I130 Hypertensive heart and chronic kidney disease with heart failure and stage 1 through stage 4 chronic kidney disease, or unspecified chronic kidney disease: Secondary | ICD-10-CM | POA: Diagnosis not present

## 2016-04-19 DIAGNOSIS — E1122 Type 2 diabetes mellitus with diabetic chronic kidney disease: Secondary | ICD-10-CM | POA: Diagnosis not present

## 2016-04-19 DIAGNOSIS — N184 Chronic kidney disease, stage 4 (severe): Secondary | ICD-10-CM | POA: Diagnosis not present

## 2016-04-19 DIAGNOSIS — Z794 Long term (current) use of insulin: Secondary | ICD-10-CM | POA: Diagnosis not present

## 2016-04-19 DIAGNOSIS — Z7984 Long term (current) use of oral hypoglycemic drugs: Secondary | ICD-10-CM | POA: Diagnosis not present

## 2016-04-19 DIAGNOSIS — M6281 Muscle weakness (generalized): Secondary | ICD-10-CM | POA: Diagnosis not present

## 2016-04-20 ENCOUNTER — Encounter (HOSPITAL_COMMUNITY): Payer: Self-pay | Admitting: Emergency Medicine

## 2016-04-20 ENCOUNTER — Inpatient Hospital Stay (HOSPITAL_COMMUNITY)
Admission: EM | Admit: 2016-04-20 | Discharge: 2016-05-03 | DRG: 682 | Disposition: A | Discharge status: Skilled Nursing Facility | Payer: Medicare Other | Attending: Internal Medicine | Admitting: Internal Medicine

## 2016-04-20 DIAGNOSIS — D638 Anemia in other chronic diseases classified elsewhere: Secondary | ICD-10-CM | POA: Diagnosis present

## 2016-04-20 DIAGNOSIS — E785 Hyperlipidemia, unspecified: Secondary | ICD-10-CM | POA: Diagnosis present

## 2016-04-20 DIAGNOSIS — N185 Chronic kidney disease, stage 5: Secondary | ICD-10-CM | POA: Diagnosis not present

## 2016-04-20 DIAGNOSIS — T45515A Adverse effect of anticoagulants, initial encounter: Secondary | ICD-10-CM | POA: Diagnosis not present

## 2016-04-20 DIAGNOSIS — N179 Acute kidney failure, unspecified: Secondary | ICD-10-CM | POA: Diagnosis present

## 2016-04-20 DIAGNOSIS — R609 Edema, unspecified: Secondary | ICD-10-CM

## 2016-04-20 DIAGNOSIS — G9340 Encephalopathy, unspecified: Secondary | ICD-10-CM

## 2016-04-20 DIAGNOSIS — E1122 Type 2 diabetes mellitus with diabetic chronic kidney disease: Secondary | ICD-10-CM | POA: Diagnosis not present

## 2016-04-20 DIAGNOSIS — Z794 Long term (current) use of insulin: Secondary | ICD-10-CM

## 2016-04-20 DIAGNOSIS — Z888 Allergy status to other drugs, medicaments and biological substances status: Secondary | ICD-10-CM | POA: Diagnosis not present

## 2016-04-20 DIAGNOSIS — Z9861 Coronary angioplasty status: Secondary | ICD-10-CM | POA: Diagnosis not present

## 2016-04-20 DIAGNOSIS — I272 Other secondary pulmonary hypertension: Secondary | ICD-10-CM | POA: Diagnosis not present

## 2016-04-20 DIAGNOSIS — K754 Autoimmune hepatitis: Secondary | ICD-10-CM | POA: Diagnosis not present

## 2016-04-20 DIAGNOSIS — M25512 Pain in left shoulder: Secondary | ICD-10-CM

## 2016-04-20 DIAGNOSIS — M549 Dorsalgia, unspecified: Secondary | ICD-10-CM

## 2016-04-20 DIAGNOSIS — I129 Hypertensive chronic kidney disease with stage 1 through stage 4 chronic kidney disease, or unspecified chronic kidney disease: Principal | ICD-10-CM | POA: Diagnosis present

## 2016-04-20 DIAGNOSIS — E8770 Fluid overload, unspecified: Secondary | ICD-10-CM | POA: Diagnosis not present

## 2016-04-20 DIAGNOSIS — R188 Other ascites: Secondary | ICD-10-CM | POA: Diagnosis present

## 2016-04-20 DIAGNOSIS — E876 Hypokalemia: Secondary | ICD-10-CM | POA: Diagnosis not present

## 2016-04-20 DIAGNOSIS — Z951 Presence of aortocoronary bypass graft: Secondary | ICD-10-CM

## 2016-04-20 DIAGNOSIS — E871 Hypo-osmolality and hyponatremia: Secondary | ICD-10-CM | POA: Diagnosis present

## 2016-04-20 DIAGNOSIS — M25559 Pain in unspecified hip: Secondary | ICD-10-CM

## 2016-04-20 DIAGNOSIS — I5022 Chronic systolic (congestive) heart failure: Secondary | ICD-10-CM | POA: Diagnosis not present

## 2016-04-20 DIAGNOSIS — K7290 Hepatic failure, unspecified without coma: Secondary | ICD-10-CM | POA: Diagnosis not present

## 2016-04-20 DIAGNOSIS — Z91041 Radiographic dye allergy status: Secondary | ICD-10-CM

## 2016-04-20 DIAGNOSIS — I482 Chronic atrial fibrillation, unspecified: Secondary | ICD-10-CM | POA: Diagnosis present

## 2016-04-20 DIAGNOSIS — N189 Chronic kidney disease, unspecified: Secondary | ICD-10-CM

## 2016-04-20 DIAGNOSIS — R000 Tachycardia, unspecified: Secondary | ICD-10-CM | POA: Diagnosis present

## 2016-04-20 DIAGNOSIS — Z7982 Long term (current) use of aspirin: Secondary | ICD-10-CM | POA: Diagnosis not present

## 2016-04-20 DIAGNOSIS — L299 Pruritus, unspecified: Secondary | ICD-10-CM

## 2016-04-20 DIAGNOSIS — I5023 Acute on chronic systolic (congestive) heart failure: Secondary | ICD-10-CM | POA: Diagnosis present

## 2016-04-20 DIAGNOSIS — I509 Heart failure, unspecified: Secondary | ICD-10-CM | POA: Diagnosis not present

## 2016-04-20 DIAGNOSIS — R410 Disorientation, unspecified: Secondary | ICD-10-CM

## 2016-04-20 DIAGNOSIS — N184 Chronic kidney disease, stage 4 (severe): Secondary | ICD-10-CM | POA: Diagnosis not present

## 2016-04-20 DIAGNOSIS — Z6831 Body mass index (BMI) 31.0-31.9, adult: Secondary | ICD-10-CM

## 2016-04-20 DIAGNOSIS — R05 Cough: Secondary | ICD-10-CM

## 2016-04-20 DIAGNOSIS — I248 Other forms of acute ischemic heart disease: Secondary | ICD-10-CM | POA: Diagnosis present

## 2016-04-20 DIAGNOSIS — Z803 Family history of malignant neoplasm of breast: Secondary | ICD-10-CM

## 2016-04-20 DIAGNOSIS — I2510 Atherosclerotic heart disease of native coronary artery without angina pectoris: Secondary | ICD-10-CM | POA: Diagnosis present

## 2016-04-20 DIAGNOSIS — D631 Anemia in chronic kidney disease: Secondary | ICD-10-CM | POA: Diagnosis present

## 2016-04-20 DIAGNOSIS — I493 Ventricular premature depolarization: Secondary | ICD-10-CM | POA: Diagnosis present

## 2016-04-20 DIAGNOSIS — R059 Cough, unspecified: Secondary | ICD-10-CM

## 2016-04-20 DIAGNOSIS — J449 Chronic obstructive pulmonary disease, unspecified: Secondary | ICD-10-CM | POA: Diagnosis not present

## 2016-04-20 DIAGNOSIS — Z79899 Other long term (current) drug therapy: Secondary | ICD-10-CM

## 2016-04-20 DIAGNOSIS — Z87891 Personal history of nicotine dependence: Secondary | ICD-10-CM | POA: Diagnosis not present

## 2016-04-20 DIAGNOSIS — M109 Gout, unspecified: Secondary | ICD-10-CM | POA: Diagnosis not present

## 2016-04-20 DIAGNOSIS — N183 Chronic kidney disease, stage 3 (moderate): Secondary | ICD-10-CM | POA: Diagnosis not present

## 2016-04-20 DIAGNOSIS — M79605 Pain in left leg: Secondary | ICD-10-CM

## 2016-04-20 DIAGNOSIS — Z7901 Long term (current) use of anticoagulants: Secondary | ICD-10-CM

## 2016-04-20 DIAGNOSIS — R748 Abnormal levels of other serum enzymes: Secondary | ICD-10-CM | POA: Diagnosis not present

## 2016-04-20 DIAGNOSIS — R339 Retention of urine, unspecified: Secondary | ICD-10-CM | POA: Diagnosis not present

## 2016-04-20 LAB — COMPREHENSIVE METABOLIC PANEL
ALT: 15 U/L (ref 14–54)
AST: 27 U/L (ref 15–41)
Albumin: 2.7 g/dL — ABNORMAL LOW (ref 3.5–5.0)
Alkaline Phosphatase: 258 U/L — ABNORMAL HIGH (ref 38–126)
Anion gap: 16 — ABNORMAL HIGH (ref 5–15)
BUN: 70 mg/dL — AB (ref 6–20)
CHLORIDE: 91 mmol/L — AB (ref 101–111)
CO2: 27 mmol/L (ref 22–32)
CREATININE: 3.12 mg/dL — AB (ref 0.44–1.00)
Calcium: 8.3 mg/dL — ABNORMAL LOW (ref 8.9–10.3)
GFR calc Af Amer: 16 mL/min — ABNORMAL LOW (ref >60)
GFR calc non Af Amer: 14 mL/min — ABNORMAL LOW (ref >60)
Glucose, Bld: 179 mg/dL — ABNORMAL HIGH (ref 65–99)
Potassium: 2.4 mmol/L — CL (ref 3.5–5.1)
SODIUM: 134 mmol/L — AB (ref 135–145)
Total Bilirubin: 1.4 mg/dL — ABNORMAL HIGH (ref 0.3–1.2)
Total Protein: 7.2 g/dL (ref 6.5–8.1)

## 2016-04-20 LAB — CBC
HCT: 31.4 % — ABNORMAL LOW (ref 36.0–46.0)
Hemoglobin: 9.9 g/dL — ABNORMAL LOW (ref 12.0–15.0)
MCH: 28.2 pg (ref 26.0–34.0)
MCHC: 31.5 g/dL (ref 30.0–36.0)
MCV: 89.5 fL (ref 78.0–100.0)
PLATELETS: 252 10*3/uL (ref 150–400)
RBC: 3.51 MIL/uL — ABNORMAL LOW (ref 3.87–5.11)
RDW: 18.9 % — AB (ref 11.5–15.5)
WBC: 5.5 10*3/uL (ref 4.0–10.5)

## 2016-04-20 LAB — BRAIN NATRIURETIC PEPTIDE: B Natriuretic Peptide: 2581.1 pg/mL — ABNORMAL HIGH (ref 0.0–100.0)

## 2016-04-20 LAB — GLUCOSE, CAPILLARY: Glucose-Capillary: 181 mg/dL — ABNORMAL HIGH (ref 65–99)

## 2016-04-20 LAB — PROTIME-INR
INR: 1.32
PROTHROMBIN TIME: 16.5 s — AB (ref 11.4–15.2)

## 2016-04-20 LAB — TSH: TSH: 2.38 u[IU]/mL (ref 0.350–4.500)

## 2016-04-20 LAB — MAGNESIUM: Magnesium: 1.6 mg/dL — ABNORMAL LOW (ref 1.7–2.4)

## 2016-04-20 MED ORDER — METOLAZONE 5 MG PO TABS
5.0000 mg | ORAL_TABLET | Freq: Every day | ORAL | Status: DC
  Administered 2016-04-21: 5 mg via ORAL
  Filled 2016-04-20: qty 1

## 2016-04-20 MED ORDER — SODIUM CHLORIDE 0.9% FLUSH
3.0000 mL | Freq: Two times a day (BID) | INTRAVENOUS | Status: DC
  Administered 2016-04-20 – 2016-05-03 (×24): 3 mL via INTRAVENOUS

## 2016-04-20 MED ORDER — WARFARIN SODIUM 4 MG PO TABS
4.0000 mg | ORAL_TABLET | Freq: Once | ORAL | Status: AC
  Administered 2016-04-20: 4 mg via ORAL
  Filled 2016-04-20: qty 1

## 2016-04-20 MED ORDER — INSULIN ASPART 100 UNIT/ML ~~LOC~~ SOLN
0.0000 [IU] | Freq: Every day | SUBCUTANEOUS | Status: DC
  Administered 2016-04-25 – 2016-04-26 (×2): 2 [IU] via SUBCUTANEOUS
  Administered 2016-04-29: 3 [IU] via SUBCUTANEOUS

## 2016-04-20 MED ORDER — POTASSIUM CHLORIDE CRYS ER 20 MEQ PO TBCR
40.0000 meq | EXTENDED_RELEASE_TABLET | Freq: Every day | ORAL | Status: DC
  Administered 2016-04-21: 40 meq via ORAL
  Filled 2016-04-20: qty 2

## 2016-04-20 MED ORDER — ATORVASTATIN CALCIUM 40 MG PO TABS
40.0000 mg | ORAL_TABLET | Freq: Every day | ORAL | Status: DC
  Administered 2016-04-21 – 2016-05-03 (×12): 40 mg via ORAL
  Filled 2016-04-20 (×13): qty 1

## 2016-04-20 MED ORDER — HYDROXYZINE HCL 25 MG PO TABS
25.0000 mg | ORAL_TABLET | Freq: Four times a day (QID) | ORAL | Status: DC | PRN
  Administered 2016-04-20 – 2016-04-22 (×2): 25 mg via ORAL
  Administered 2016-04-23 – 2016-05-01 (×8): 50 mg via ORAL
  Filled 2016-04-20 (×5): qty 2
  Filled 2016-04-20 (×2): qty 1
  Filled 2016-04-20 (×4): qty 2
  Filled 2016-04-20: qty 1

## 2016-04-20 MED ORDER — POTASSIUM CHLORIDE CRYS ER 20 MEQ PO TBCR
40.0000 meq | EXTENDED_RELEASE_TABLET | Freq: Once | ORAL | Status: AC
  Administered 2016-04-20: 40 meq via ORAL
  Filled 2016-04-20: qty 2

## 2016-04-20 MED ORDER — ASPIRIN EC 81 MG PO TBEC
81.0000 mg | DELAYED_RELEASE_TABLET | Freq: Every day | ORAL | Status: DC
  Administered 2016-04-21 – 2016-05-03 (×12): 81 mg via ORAL
  Filled 2016-04-20 (×13): qty 1

## 2016-04-20 MED ORDER — ALLOPURINOL 100 MG PO TABS
100.0000 mg | ORAL_TABLET | Freq: Every day | ORAL | Status: DC
  Administered 2016-04-21 – 2016-05-03 (×12): 100 mg via ORAL
  Filled 2016-04-20 (×13): qty 1

## 2016-04-20 MED ORDER — SODIUM CHLORIDE 0.9 % IV BOLUS (SEPSIS): 500.0000 mL | Freq: Once | INTRAVENOUS | Status: DC

## 2016-04-20 MED ORDER — WARFARIN - PHARMACIST DOSING INPATIENT
Freq: Every day | Status: DC
  Administered 2016-04-24 – 2016-04-25 (×2)

## 2016-04-20 MED ORDER — ENOXAPARIN SODIUM 80 MG/0.8ML ~~LOC~~ SOLN
80.0000 mg | Freq: Every day | SUBCUTANEOUS | Status: DC
  Administered 2016-04-20 – 2016-04-24 (×5): 80 mg via SUBCUTANEOUS
  Filled 2016-04-20 (×5): qty 0.8

## 2016-04-20 MED ORDER — ACETAMINOPHEN 325 MG PO TABS
650.0000 mg | ORAL_TABLET | Freq: Four times a day (QID) | ORAL | Status: DC | PRN
  Administered 2016-04-21 – 2016-05-03 (×11): 650 mg via ORAL
  Filled 2016-04-20 (×11): qty 2

## 2016-04-20 MED ORDER — ACETAMINOPHEN 650 MG RE SUPP: 650.0000 mg | Freq: Four times a day (QID) | RECTAL | Status: DC | PRN

## 2016-04-20 MED ORDER — POTASSIUM CHLORIDE 10 MEQ/100ML IV SOLN
10.0000 meq | Freq: Once | INTRAVENOUS | Status: AC
  Administered 2016-04-20: 10 meq via INTRAVENOUS
  Filled 2016-04-20: qty 100

## 2016-04-20 MED ORDER — PANTOPRAZOLE SODIUM 40 MG PO TBEC
40.0000 mg | DELAYED_RELEASE_TABLET | Freq: Two times a day (BID) | ORAL | Status: DC
  Administered 2016-04-21 – 2016-04-26 (×11): 40 mg via ORAL
  Filled 2016-04-20 (×12): qty 1

## 2016-04-20 MED ORDER — AMLODIPINE BESYLATE 10 MG PO TABS
10.0000 mg | ORAL_TABLET | Freq: Every day | ORAL | Status: DC
  Administered 2016-04-21 – 2016-04-23 (×3): 10 mg via ORAL
  Filled 2016-04-20 (×3): qty 1

## 2016-04-20 MED ORDER — INSULIN ASPART 100 UNIT/ML ~~LOC~~ SOLN
0.0000 [IU] | Freq: Three times a day (TID) | SUBCUTANEOUS | Status: DC
  Administered 2016-04-21: 3 [IU] via SUBCUTANEOUS
  Administered 2016-04-21 – 2016-04-22 (×3): 2 [IU] via SUBCUTANEOUS
  Administered 2016-04-22 (×2): 1 [IU] via SUBCUTANEOUS
  Administered 2016-04-23: 2 [IU] via SUBCUTANEOUS
  Administered 2016-04-23 – 2016-04-24 (×2): 3 [IU] via SUBCUTANEOUS
  Administered 2016-04-24 – 2016-04-25 (×3): 2 [IU] via SUBCUTANEOUS
  Administered 2016-04-25 (×2): 3 [IU] via SUBCUTANEOUS
  Administered 2016-04-26: 2 [IU] via SUBCUTANEOUS
  Administered 2016-04-26: 3 [IU] via SUBCUTANEOUS
  Administered 2016-04-26: 5 [IU] via SUBCUTANEOUS
  Administered 2016-04-27: 1 [IU] via SUBCUTANEOUS
  Administered 2016-04-27 – 2016-04-28 (×2): 2 [IU] via SUBCUTANEOUS
  Administered 2016-04-28 (×2): 1 [IU] via SUBCUTANEOUS
  Administered 2016-04-29: 2 [IU] via SUBCUTANEOUS
  Administered 2016-04-29: 1 [IU] via SUBCUTANEOUS
  Administered 2016-04-29: 3 [IU] via SUBCUTANEOUS
  Administered 2016-04-30 (×2): 2 [IU] via SUBCUTANEOUS
  Administered 2016-05-01: 1 [IU] via SUBCUTANEOUS
  Administered 2016-05-02: 5 [IU] via SUBCUTANEOUS
  Administered 2016-05-02: 1 [IU] via SUBCUTANEOUS
  Administered 2016-05-02 – 2016-05-03 (×3): 5 [IU] via SUBCUTANEOUS

## 2016-04-20 NOTE — ED Notes (Signed)
Attempted to call report

## 2016-04-20 NOTE — ED Notes (Signed)
Changed IV Team order to STAT d/t 1hr wait and bed assigned status.

## 2016-04-20 NOTE — ED Notes (Signed)
Attempted to call report. Floor RN returned my previous call while I was with other pt.

## 2016-04-20 NOTE — ED Provider Notes (Signed)
[facility] DEPT Provider Note   CSN: [id number] Arrival date & time: 04/20/16  1523  First Provider Contact:  None       History   Chief Complaint Chief Complaint  Patient presents with  . Pruritis  . Chronic Kidney Disease    HPI Cassandra Bonilla is a 75 y.o. female.  Patient is a 75 year old female with past medical history of type 2 diabetes, atrial fibrillation, CHF, CABG, and chronic renal insufficiency. She presents for evaluation of itching. This is been an ongoing problem for her for several months, however became worse the last few days. She recently had her dose of Lasix increased from 80 mg daily to 80 mg twice daily. She is now feeling weak. She denies any chest pain or difficulty breathing.   The history is provided by the patient and the spouse.    Past Medical History:  Diagnosis Date  . CHF (congestive heart failure) ([place])   . CKD (chronic kidney disease) stage 3, GFR 30-59 ml/min   . COPD (chronic obstructive pulmonary disease) ([place])   . Coronary artery disease   . Diabetes mellitus without complication ([place])   . Gout   . Hypertension   . Hypokalemia 03/21/2016    Patient Active Problem List   Diagnosis Date Noted  . Hypokalemia 03/22/2016  . Pruritus 03/22/2016  . Anemia of chronic disease 03/22/2016  . Chronic atrial fibrillation ([place]) 03/22/2016  . Diabetes mellitus type 2 with complications ([place]) [id number]  . Chronic systolic congestive heart failure ([place]) 03/22/2016  . Acute renal failure superimposed on stage 3 chronic kidney disease ([place]) 03/22/2016    Past Surgical History:  Procedure Laterality Date  . CARDIAC SURGERY    . CHOLECYSTECTOMY    . CORONARY ANGIOPLASTY      OB History    No data available       Home Medications    Prior to Admission medications   Medication Sig Start Date End Date Taking? Authorizing Provider  allopurinol (ZYLOPRIM) 100 MG tablet Take 100 mg by mouth daily.    Historical Provider, MD  amLODipine  (NORVASC) 10 MG tablet Take 10 mg by mouth daily.    Historical Provider, MD  aspirin EC 81 MG tablet Take 81 mg by mouth daily.    Historical Provider, MD  atorvastatin (LIPITOR) 40 MG tablet Take 40 mg by mouth daily.    Historical Provider, MD  ferrous sulfate 325 (65 FE) MG tablet Take 1 tablet (325 mg total) by mouth 2 (two) times daily with a meal. 03/24/16   Hosie Poisson, MD  furosemide (LASIX) 80 MG tablet Take 1 tablet (80 mg total) by mouth 2 (two) times daily. 03/31/16   Hosie Poisson, MD  gabapentin (NEURONTIN) 300 MG capsule Take 1 capsule (300 mg total) by mouth daily as needed (itching). 03/24/16   Hosie Poisson, MD  guaiFENesin-dextromethorphan (ROBITUSSIN DM) 100-10 MG/5ML syrup Take 5 mLs by mouth every 4 (four) hours as needed for cough. 03/24/16   Hosie Poisson, MD  hydrOXYzine (ATARAX/VISTARIL) 25 MG tablet Take 1-2 tablets (25-50 mg total) by mouth every 6 (six) hours as needed for itching. 03/24/16   Hosie Poisson, MD  insulin lispro (HUMALOG) 100 UNIT/ML injection Inject 8-10 Units into the skin 2 (two) times daily with a meal. Takes 8 units if CBG <400, takes 10 units if CBG >400    Historical Provider, MD  metolazone (ZAROXOLYN) 5 MG tablet Take 5 mg by mouth as needed (fluid overload).  Historical Provider, MD  pantoprazole (PROTONIX) 40 MG tablet Take 40 mg by mouth 2 (two) times daily.    Historical Provider, MD    Family History No family history on file.  Social History Social History  Substance Use Topics  . Smoking status: Former Research scientist (life sciences)  . Smokeless tobacco: Never Used     Comment: quit smoking  in 1993  . Alcohol use No     Allergies   Review of patient's allergies indicates no known allergies.   Review of Systems Review of Systems  All other systems reviewed and are negative.    Physical Exam Updated Vital Signs BP 131/74 (BP Location: Right Arm)   Pulse 101   Temp 98.2 F (36.8 C) (Oral)   Resp 22   Wt 176 lb (79.8 kg)   SpO2 98%   BMI 32.19 kg/m    Physical Exam  Constitutional: She is oriented to person, place, and time. She appears well-developed and well-nourished. No distress.  HENT:  Head: Normocephalic and atraumatic.  Neck: Normal range of motion. Neck supple.  Cardiovascular: Normal rate and regular rhythm.  Exam reveals no gallop and no friction rub.   No murmur heard. Pulmonary/Chest: Effort normal and breath sounds normal. No respiratory distress. She has no wheezes. She has no rales.  Abdominal: Soft. Bowel sounds are normal. She exhibits no distension. There is no tenderness.  Musculoskeletal: Normal range of motion. She exhibits edema.  There is 2+ pitting edema of both lower extremities.  Neurological: She is alert and oriented to person, place, and time.  Skin: Skin is warm and dry. She is not diaphoretic.  Nursing note and vitals reviewed.    ED Treatments / Results  Labs (all labs ordered are listed, but only abnormal results are displayed) Labs Reviewed  COMPREHENSIVE METABOLIC PANEL - Abnormal; Notable for the following:       Result Value   Sodium 134 (*)    Potassium 2.4 (*)    Chloride 91 (*)    Glucose, Bld 179 (*)    BUN 70 (*)    Creatinine, Ser 3.12 (*)    Calcium 8.3 (*)    Albumin 2.7 (*)    Alkaline Phosphatase 258 (*)    Total Bilirubin 1.4 (*)    GFR calc non Af Amer 14 (*)    GFR calc Af Amer 16 (*)    Anion gap 16 (*)    All other components within normal limits  CBC - Abnormal; Notable for the following:    RBC 3.51 (*)    Hemoglobin 9.9 (*)    HCT 31.4 (*)    RDW 18.9 (*)    All other components within normal limits    EKG  EKG Interpretation None       Radiology No results found.  Procedures Procedures (including critical care time)  Medications Ordered in ED Medications  potassium chloride 10 mEq in 100 mL IVPB (not administered)  potassium chloride 10 mEq in 100 mL IVPB (not administered)  potassium chloride SA (K-DUR,KLOR-CON) CR tablet 40 mEq (not  administered)     Initial Impression / Assessment and Plan / ED Course  I have reviewed the triage vital signs and the nursing notes.  Pertinent labs & imaging results that were available during my care of the patient were reviewed by me and considered in my medical decision making (see chart for details).  Clinical Course      Final Clinical Impressions(s) / ED Diagnoses  Final diagnoses:  None   Labs reveal a potassium of 2.4.  She was given IV and oral replacement in the ED.  I have spoken with Dr. Loleta Books from the Hospitalist service who agrees to admit.    New Prescriptions New Prescriptions   No medications on file     Veryl Speak, MD 04/20/16 [phone]

## 2016-04-20 NOTE — ED Notes (Signed)
IV Team at bedside 

## 2016-04-20 NOTE — Progress Notes (Signed)
ANTICOAGULATION CONSULT NOTE - Initial Consult  Pharmacy Consult for Lovenox/Warfarin  Indication: atrial fibrillation  Allergies  Allergen Reactions  . Contrast Media  [Iodinated Diagnostic Agents] Hives  . Prednisone Other (See Comments)    Confusion    Patient Measurements: Weight: 176 lb (79.8 kg)  Vital Signs: Temp: 98.2 F (36.8 C) (08/05 2109) Temp Source: Oral (08/05 2109) BP: 140/82 (08/05 2109) Pulse Rate: 100 (08/05 2109)  Labs:  Recent Labs  04/20/16 1630 04/20/16 2127  HGB 9.9*  --   HCT 31.4*  --   PLT 252  --   LABPROT  --  16.5*  INR  --  1.32  CREATININE 3.12*  --     Estimated Creatinine Clearance: 15.5 mL/min (by C-G formula based on SCr of 3.12 mg/dL).   Medical History: Past Medical History:  Diagnosis Date  . CHF (congestive heart failure) ([place])   . CKD (chronic kidney disease) stage 3, GFR 30-59 ml/min   . COPD (chronic obstructive pulmonary disease) ([place])   . Coronary artery disease   . Diabetes mellitus without complication ([place])   . Gout   . Hypertension   . Hypokalemia 03/21/2016    Assessment: 75 y/o F presented to ED today with pruritis, found to be hypokalemic, on warfarin PTA for afib, INR is sub-therapeutic on admit at 1.32, MD wishes to cover with Lovenox while INR is sub-therapeutic, Hgb 9.9, CrCl ~15 will require dose adjustment of Lovenox.   Goal of Therapy:  INR 2-3 Monitor platelets by anticoagulation protocol: Yes   Plan:  -Warfarin 4 mg PO x 1 tonight  -Lovenox 80 mg subcutaneous q24h until INR is >2 -Daily PT/INR -Monitor for bleeding  Narda Bonds 04/20/2016,10:56 PM

## 2016-04-20 NOTE — ED Triage Notes (Signed)
Pt c/o itching ongoing but worse today-- hx chronic kidney disease, not on dialysis at present.

## 2016-04-20 NOTE — ED Notes (Signed)
IV attempted x's 2 by 2 RN's without success

## 2016-04-20 NOTE — ED Notes (Signed)
Husband st's pt was given Rx for Hydroxyzine 25mg  1 1/2 weeks ago for itching and has been taking it without relief.  Pt's only c/o today is itching.  Husband at bedside.

## 2016-04-20 NOTE — H&P (Signed)
History and Physical  Patient Name: Cassandra Bonilla     [id number]    DOB: 1941-08-09    DOA: 04/20/2016 PCP: Marco Collie, MD   Patient coming from: Home  Chief Complaint: Itching  HPI: Cassandra Bonilla is a 75 y.o. female with a past medical history significant for Afib not on anticoagulation, chronic systolic CHF EF [id number], anemia, CKD IV-V, and DM who presents with itching but is found to have hypokalemia again.  Today, she presented to the ER actually just with pruritis not responding to her home Atarax.  Knows this is from uremia, but wanted relief.  Had also noted weight gain and worse leg swelling, but no change in respiratory symptoms, dyspnea, cough, chest discomfort, urine output.  ED course: -Afebrile, heart rate 100, respirations normal, blood pressure 130/75 mmHg, saturating well on room air -Na 134, K 2.4, Cr 3.12 (baseline 2.9-3 at last discharge, as recently as May 2017 was 2.2-2.3 in [place]), WBC 5.5K, Hgb 9.9 -ECG showed sinus tachycardia -She was given 60 mEQ of K in the ER and TRH were asked to evaluate for admission  The patient was ambulatory and able to climb "13 flights of stairs to our apartment" in [place] as recently as a year ago, but in February had a flare of CHF and worsening of her renal function (previously baseline Cr high 1s, subsequently 2-3), and since then has been more debilitated, walking only with a walker.  Two months ago she moved with her husband from [place], and was admitted with hypokalemia here 5 weeks ago.  Since discharge, she has established with a PCP and nephrologist Hollie Salk) in [place] where she lives.  Was in rehab about 2 weeks, then home.  Has been back on furosemide 80 mg BID with metolazone daily per Nephrology, to diurese (her discharge weight from here last month was 150 lbs, today back up to 176 lbs).  Had lab work done a week ago that was fine.          Review of Systems:  Review of Systems  Constitutional: Negative for chills, diaphoresis,  fever, malaise/fatigue and weight loss.  HENT: Negative.   Eyes: Negative.   Respiratory: Negative for cough, hemoptysis, sputum production, shortness of breath and wheezing.   Cardiovascular: Positive for leg swelling. Negative for chest pain, palpitations, orthopnea, claudication and PND.  Gastrointestinal: Negative.   Genitourinary: Negative.   Musculoskeletal: Negative.   Skin: Positive for itching. Negative for rash.  Neurological: Negative.  Negative for weakness (chronic).  Endo/Heme/Allergies: Negative.   Psychiatric/Behavioral: Negative.       Past Medical History:  Diagnosis Date  . CHF (congestive heart failure) ([place])   . CKD (chronic kidney disease) stage 3, GFR 30-59 ml/min   . COPD (chronic obstructive pulmonary disease) ([place])   . Coronary artery disease   . Diabetes mellitus without complication ([place])   . Gout   . Hypertension   . Hypokalemia 03/21/2016    Past Surgical History:  Procedure Laterality Date  . CAROTID ENDARTERECTOMY    . CESAREAN SECTION    . CHOLECYSTECTOMY    . CORONARY ANGIOPLASTY    . CORONARY ARTERY BYPASS GRAFT      Social History: Patient lives with her husband.  He was a Company secretary in [place] until a few months ago when they retired to [employer] [place] to be near family.  Patient walks currently with a walker only, but previously was able to ambulate without assistance.  Remote smoking history.    Allergies  Allergen Reactions  . Contrast Media  [Iodinated Diagnostic Agents] Hives  . Prednisone Other (See Comments)    Confusion    Family history: family history includes Breast cancer in her mother.  Prior to Admission medications   Medication Sig Start Date End Date Taking? Authorizing Provider  allopurinol (ZYLOPRIM) 100 MG tablet Take 100 mg by mouth daily.   Yes Historical Provider, MD  amLODipine (NORVASC) 10 MG tablet Take 10 mg by mouth daily.   Yes Historical Provider, MD  aspirin EC 81 MG tablet Take 81 mg by mouth daily.   Yes  Historical Provider, MD  atorvastatin (LIPITOR) 40 MG tablet Take 40 mg by mouth daily.   Yes Historical Provider, MD  furosemide (LASIX) 80 MG tablet Take 1 tablet (80 mg total) by mouth 2 (two) times daily. 03/31/16  Yes Hosie Poisson, MD  hydrOXYzine (ATARAX/VISTARIL) 25 MG tablet Take 1-2 tablets (25-50 mg total) by mouth every 6 (six) hours as needed for itching. 03/24/16  Yes Hosie Poisson, MD  insulin glargine (LANTUS) 100 UNIT/ML injection Inject 5 Units into the skin daily.   Yes Historical Provider, MD  insulin lispro (HUMALOG) 100 UNIT/ML injection Inject 8-10 Units into the skin 2 (two) times daily with a meal. Takes 8 units if CBG <400, takes 10 units if CBG >400   Yes Historical Provider, MD  metolazone (ZAROXOLYN) 5 MG tablet Take 5 mg by mouth as needed (fluid overload).   Yes Historical Provider, MD  pantoprazole (PROTONIX) 40 MG tablet Take 40 mg by mouth 2 (two) times daily.   Yes Historical Provider, MD       Physical Exam: BP 120/56   Pulse 96   Temp 98.2 F (36.8 C) (Oral)   Resp 15   Wt 79.8 kg (176 lb)   SpO2 99%   BMI 32.19 kg/m  General appearance: Well-developed, elderly adult female, alert and in no acute distress, appears chronically debilitated.   Eyes: Anicteric, conjunctiva pink, lids and lashes normal.     ENT: No nasal deformity, discharge, or epistaxis.  OP moist without lesions.   Skin: Warm and dry.  No suspicious rashes or lesions. Wine bottle deformity of both lower extremities from venous insuff.   Cardiac: Tachcyardic, nl [id number], systolic murmurs appreciated.  Capillary refill is brisk.  JVP distended.  Pitting thigh edema bilaterally.  Radial and DP pulses 2+ and symmetric. Respiratory: Normal respiratory rate and rhythm.  No wheezes or rales.. Abdomen: Abdomen soft without rigidity.  No TTP. No ascites, distension.   MSK: No deformities or effusions. Neuro: Cranial nerves intact. Sensorium intact and responding to questions, attention normal.   Speech is fluent.  Moves all extremities equally and with normal coordination.    Psych: Behavior appropriate.  Affect noraml.  No evidence of aural or visual hallucinations or delusions.       Labs on Admission:  I have personally reviewed the following studies: The metabolic panel shows mild hyponatremia, hypokalemia, stable renal insufficiency. The total bilirubin is minimally elevated. The complete blood count shows no leukocytosis or thrombocytopenia, stable anemia. Hypoalbuminemia.   EKG: Independently reviewed. Rate 104, QTC 452,ischemic changes.  Echocardiogram in May 2017 in [place]: EF 40-45% Severe PH Moderate to severe TR     Assessment/Plan 1. Hypokalemia:  60 mEq given in ER. -Repeat potassium in AM -Check magnesium -Continue potassium 40 mEq daily   2. Fluid overload:  From CKD and right sided and systolic CHF -Continue metolozone and furosemide orally for  now  3. Anemia of chronic renal insufficiency:  Stable  4. Atrial fibrillation:  CHADS2-VASc 5.  Was on warfarin but this was stopped at some point because of her anemia.  There was never any bleeding event (melena or hematochezia), just that she was anemic.  -Lovenox for Afib dosing -Warfarin dosed per pharmacy -Continue metoprolol  5. Chronic systolic and right sided CHF:  EF 40-45% in May at last echocardiogram in [place].  Pulmonary HTN.   Not on lisinopril because of renal insufficiency.  -Check echocardiogram -Consult to Cardiology, appreciate cares -For now, I will maintain her outpatient oral diuresis regimen, and see what she produces in terms of urine output overnight -Strict I/Os, daily weights, daily BMP  6. CKD IV-V:  No fistula in place.  7. IDDM:  -Hold home glargine -Sliding scale corrections, renal dose  8. Coronary artery disease and HTN: History of CABG, PCI 2, CEA. -Continue amlodipine, furosemide, metolazone, metorpolol, and statin  9. Other  medications: -Continue PPI and gabapentin        DVT prophylaxis: Lovenox bridge to warfarin  Code Status: FULL  Family Communication: Husband and daughter at bedside  Disposition Plan: Anticipate potassium supplementation and then re-evalaation, possible admission and diuresis. Consults called: None overnight Admission status: OBS   Medical decision making: Patient seen at 7:20 PM on 04/20/2016.  The patient was discussed with Dr. Stark Jock.  What exists of the patient's chart and outside records from [place] was reviewed in depth.  Clinical condition: stable.          Edwin Dada [facility] Pager [phone]

## 2016-04-21 ENCOUNTER — Observation Stay (HOSPITAL_COMMUNITY): Payer: Medicare Other

## 2016-04-21 ENCOUNTER — Observation Stay (HOSPITAL_BASED_OUTPATIENT_CLINIC_OR_DEPARTMENT_OTHER): Payer: Medicare Other

## 2016-04-21 DIAGNOSIS — I519 Heart disease, unspecified: Secondary | ICD-10-CM

## 2016-04-21 DIAGNOSIS — I509 Heart failure, unspecified: Secondary | ICD-10-CM | POA: Diagnosis not present

## 2016-04-21 DIAGNOSIS — D638 Anemia in other chronic diseases classified elsewhere: Secondary | ICD-10-CM | POA: Diagnosis not present

## 2016-04-21 DIAGNOSIS — E876 Hypokalemia: Secondary | ICD-10-CM | POA: Diagnosis not present

## 2016-04-21 DIAGNOSIS — I5041 Acute combined systolic (congestive) and diastolic (congestive) heart failure: Secondary | ICD-10-CM

## 2016-04-21 DIAGNOSIS — I5022 Chronic systolic (congestive) heart failure: Secondary | ICD-10-CM | POA: Diagnosis not present

## 2016-04-21 DIAGNOSIS — Z794 Long term (current) use of insulin: Secondary | ICD-10-CM

## 2016-04-21 DIAGNOSIS — I482 Chronic atrial fibrillation: Secondary | ICD-10-CM

## 2016-04-21 DIAGNOSIS — E1122 Type 2 diabetes mellitus with diabetic chronic kidney disease: Secondary | ICD-10-CM | POA: Diagnosis not present

## 2016-04-21 DIAGNOSIS — I25709 Atherosclerosis of coronary artery bypass graft(s), unspecified, with unspecified angina pectoris: Secondary | ICD-10-CM

## 2016-04-21 DIAGNOSIS — N184 Chronic kidney disease, stage 4 (severe): Secondary | ICD-10-CM

## 2016-04-21 LAB — GLUCOSE, CAPILLARY
GLUCOSE-CAPILLARY: 207 mg/dL — AB (ref 65–99)
GLUCOSE-CAPILLARY: 183 mg/dL — AB (ref 65–99)
GLUCOSE-CAPILLARY: 158 mg/dL — AB (ref 65–99)
GLUCOSE-CAPILLARY: 176 mg/dL — AB (ref 65–99)

## 2016-04-21 LAB — COMPREHENSIVE METABOLIC PANEL
ALBUMIN: 2.6 g/dL — AB (ref 3.5–5.0)
ALT: 16 U/L (ref 14–54)
ANION GAP: 14 (ref 5–15)
AST: 27 U/L (ref 15–41)
Alkaline Phosphatase: 265 U/L — ABNORMAL HIGH (ref 38–126)
BUN: 73 mg/dL — ABNORMAL HIGH (ref 6–20)
CALCIUM: 8 mg/dL — AB (ref 8.9–10.3)
CHLORIDE: 91 mmol/L — AB (ref 101–111)
CO2: 29 mmol/L (ref 22–32)
CREATININE: 3.25 mg/dL — AB (ref 0.44–1.00)
GFR calc non Af Amer: 13 mL/min — ABNORMAL LOW (ref >60)
GFR, EST AFRICAN AMERICAN: 15 mL/min — AB (ref >60)
Glucose, Bld: 242 mg/dL — ABNORMAL HIGH (ref 65–99)
Potassium: 2.8 mmol/L — ABNORMAL LOW (ref 3.5–5.1)
SODIUM: 134 mmol/L — AB (ref 135–145)
TOTAL PROTEIN: 7.1 g/dL (ref 6.5–8.1)
Total Bilirubin: 1.3 mg/dL — ABNORMAL HIGH (ref 0.3–1.2)

## 2016-04-21 LAB — CBC
HCT: 29.7 % — ABNORMAL LOW (ref 36.0–46.0)
Hemoglobin: 9.4 g/dL — ABNORMAL LOW (ref 12.0–15.0)
MCH: 28.4 pg (ref 26.0–34.0)
MCHC: 31.6 g/dL (ref 30.0–36.0)
MCV: 89.7 fL (ref 78.0–100.0)
PLATELETS: 221 10*3/uL (ref 150–400)
RBC: 3.31 MIL/uL — AB (ref 3.87–5.11)
RDW: 18.8 % — AB (ref 11.5–15.5)
WBC: 6 10*3/uL (ref 4.0–10.5)

## 2016-04-21 LAB — ECHOCARDIOGRAM COMPLETE: Weight: 2747.81 oz

## 2016-04-21 LAB — PROTIME-INR
INR: 1.33
Prothrombin Time: 16.6 seconds — ABNORMAL HIGH (ref 11.4–15.2)

## 2016-04-21 MED ORDER — POTASSIUM CHLORIDE CRYS ER 20 MEQ PO TBCR
40.0000 meq | EXTENDED_RELEASE_TABLET | Freq: Two times a day (BID) | ORAL | Status: DC
  Administered 2016-04-21 – 2016-05-03 (×23): 40 meq via ORAL
  Filled 2016-04-21 (×24): qty 2

## 2016-04-21 MED ORDER — HYDROXYZINE HCL 25 MG PO TABS
25.0000 mg | ORAL_TABLET | Freq: Once | ORAL | Status: AC
  Administered 2016-04-21: 25 mg via ORAL

## 2016-04-21 MED ORDER — METOPROLOL SUCCINATE ER 50 MG PO TB24
50.0000 mg | ORAL_TABLET | Freq: Every day | ORAL | Status: DC
  Administered 2016-04-22 – 2016-05-03 (×11): 50 mg via ORAL
  Filled 2016-04-21 (×12): qty 1

## 2016-04-21 MED ORDER — FUROSEMIDE 10 MG/ML IJ SOLN: 40.0000 mg | Freq: Two times a day (BID) | INTRAMUSCULAR | Status: DC

## 2016-04-21 MED ORDER — METOLAZONE 5 MG PO TABS
5.0000 mg | ORAL_TABLET | ORAL | Status: DC
  Administered 2016-04-23 – 2016-04-25 (×2): 5 mg via ORAL
  Filled 2016-04-21 (×3): qty 1

## 2016-04-21 MED ORDER — POTASSIUM CHLORIDE CRYS ER 20 MEQ PO TBCR
40.0000 meq | EXTENDED_RELEASE_TABLET | Freq: Once | ORAL | Status: AC
  Administered 2016-04-21: 40 meq via ORAL
  Filled 2016-04-21: qty 2

## 2016-04-21 MED ORDER — METOPROLOL TARTRATE 25 MG PO TABS
25.0000 mg | ORAL_TABLET | Freq: Two times a day (BID) | ORAL | Status: DC
  Administered 2016-04-21: 25 mg via ORAL
  Filled 2016-04-21: qty 1

## 2016-04-21 MED ORDER — WARFARIN SODIUM 4 MG PO TABS
4.0000 mg | ORAL_TABLET | Freq: Once | ORAL | Status: AC
  Administered 2016-04-21: 4 mg via ORAL
  Filled 2016-04-21: qty 1

## 2016-04-21 MED ORDER — FUROSEMIDE 80 MG PO TABS
80.0000 mg | ORAL_TABLET | Freq: Two times a day (BID) | ORAL | Status: DC
  Administered 2016-04-21 – 2016-04-22 (×2): 80 mg via ORAL
  Filled 2016-04-21 (×2): qty 1

## 2016-04-21 MED ORDER — MAGNESIUM SULFATE 2 GM/50ML IV SOLN
2.0000 g | Freq: Once | INTRAVENOUS | Status: AC
  Administered 2016-04-21: 2 g via INTRAVENOUS
  Filled 2016-04-21: qty 50

## 2016-04-21 NOTE — Consult Note (Signed)
CARDIOLOGY CONSULT NOTE   Patient ID: Cassandra Bonilla MRN: [id number], DOB/AGE: Sep 20, 1940   Admit date: 04/20/2016 Date of Consult: 04/21/2016   Primary Physician: Marco Collie, MD Primary Cardiologist: new Nephrologist: Dr. Hollie Salk ([place])  Pt. Profile  Cassandra Bonilla is a pleasant 75 year old African-American female with past medical history of chronic atrial fibrillation on Coumadin, chronic systolic heart failure with EF 45%, chronic anemia, CKD IV-V, HTN, HLD, DM, carotid artery stenosis s/p CEA and CAD s/p 4v CABG 1993 presented with pruritis. Cardiology consulted for heart failure.   Problem List  Past Medical History:  Diagnosis Date  . CHF (congestive heart failure) ([place])   . CKD (chronic kidney disease) stage 3, GFR 30-59 ml/min   . COPD (chronic obstructive pulmonary disease) ([place])   . Coronary artery disease   . Diabetes mellitus without complication ([place])   . Gout   . Hypertension   . Hypokalemia 03/21/2016    Past Surgical History:  Procedure Laterality Date  . CAROTID ENDARTERECTOMY    . CESAREAN SECTION    . CHOLECYSTECTOMY    . CORONARY ANGIOPLASTY    . CORONARY ARTERY BYPASS GRAFT       Allergies  Allergies  Allergen Reactions  . Contrast Media  [Iodinated Diagnostic Agents] Hives  . Prednisone Other (See Comments)    Confusion    HPI   Cassandra Bonilla is a pleasant 75 year old African-American female with past medical history of chronic atrial fibrillation on Coumadin, chronic systolic heart failure with EF 45%, chronic anemia, CKD IV-V, HTN, HLD, DM, carotid artery stenosis s/p CEA and CAD s/p 4v CABG 1993. She says since her originally bypass surgery, she has not had another cardiac catheterization. She says her last Myoview was at least 9 years ago. She has lived in [place] for more than 20 years and recently moved to [place]. She says she was traveling between [place] and [place] at the time of bypass surgery in 1993, she thinks she had  her bypass surgery in [place], however does not remember which hospital.  She was recently admitted in May 2017 at [facility] in [facility] for acute biventricular failure. She underwent aggressive IV diuresis with 80 mg twice a day of IV Lasix augmented by 5 mg twice a day metolazone, her discharge weight was 82.1 kg. Echocardiogram obtained during the admission showed decreased LV and RV systolic function with LVEF 40-45%, and also evidence of pulmonary hypertension with RVSP 75 mmHg. She did have mildly elevated troponin of 0.1 during the admission, however this was felt to be demand ischemia in the setting of fluid overload. Her baseline creatinine was felt to be between 2-3. She was eventually discharged with oral Lasix and Coumadin. She says her Coumadin has been managed by PCP. She was admitted in July 2017 with complaints of pruritus which actually occurred for 2 weeks prior to moving down to [place] and persisted since. She was found to have significant hypokalemia and was treated during last admission. He had normal bilirubin but elevated alkaline phosphatase. She was taken off of her Coumadin due to concern of anemia.   Since discharge, pruritus has not improved and continued to worsen until she returned on 04/20/2016 with the same issue. Her potassium was 2.4. Hemoglobin stable around 9.9. Alkaline phosphatase was 258. EKG showed afib with PVCs. Patient says other than significant pruritus, she has no other complaints. She denies any recent chest discomfort, she has been compliant with her  diuretic at home, she denies any shortness of breath, orthopnea or paroxysmal nocturnal dyspnea. She does have bilateral upper thigh swelling. Her INR was subtherapeutic on arrival as well. BNP was elevated at 2581. Echocardiogram was repeated on 04/21/2016 which showed EF 30-35%, akinesis of the inferolateral and inferior myocardium, moderate MR, severe TR, PA peak pressure 37  mmHg.    Inpatient Medications  . allopurinol  100 mg Oral Daily  . amLODipine  10 mg Oral Daily  . aspirin EC  81 mg Oral Daily  . atorvastatin  40 mg Oral Daily  . enoxaparin (LOVENOX) injection  80 mg Subcutaneous QHS  . furosemide  40 mg Intravenous BID  . insulin aspart  0-5 Units Subcutaneous QHS  . insulin aspart  0-9 Units Subcutaneous TID WC  . metolazone  5 mg Oral Daily  . metoprolol tartrate  25 mg Oral BID  . pantoprazole  40 mg Oral BID  . potassium chloride  40 mEq Oral BID  . sodium chloride flush  3 mL Intravenous Q12H  . warfarin  4 mg Oral ONCE-1800  . Warfarin - Pharmacist Dosing Inpatient   Does not apply q1800    Family History Family History  Problem Relation Age of Onset  . Breast cancer Mother      Social History Social History   Social History  . Marital status: Married    Spouse name: N/A  . Number of children: N/A  . Years of education: N/A   Occupational History  . Not on file.   Social History Main Topics  . Smoking status: Former Research scientist (life sciences)  . Smokeless tobacco: Never Used     Comment: quit smoking  in 1993  . Alcohol use No  . Drug use: No  . Sexual activity: Not on file   Other Topics Concern  . Not on file   Social History Narrative  . No narrative on file     Review of Systems  General:  No chills, fever, night sweats or weight changes.  Cardiovascular:  No chest pain, dyspnea on exertion, orthopnea, palpitations, paroxysmal nocturnal dyspnea. Positive upper thigh edema Dermatological: No rash, lesions/masses Respiratory: No cough, dyspnea Urologic: No hematuria, dysuria Abdominal:   No nausea, vomiting, diarrhea, bright red blood per rectum, melena, or hematemesis Neurologic:  No visual changes, wkns, changes in mental status. + Pruritus All other systems reviewed and are otherwise negative except as noted above.  Physical Exam  Blood pressure 134/80, pulse 99, temperature 98 F (36.7 C), temperature source Oral,  resp. rate 18, weight 171 lb 11.8 oz (77.9 kg), SpO2 95 %.  General: Pleasant, NAD Psych: Normal affect. Neuro: Alert and oriented X 3. Moves all extremities spontaneously. HEENT: Normal  Neck: Supple without bruits. Prominent JVD Lungs:  Resp regular and unlabored. Mild intermittent right basilar rale, otherwise clear to auscultation Heart: Irregularly irregular. no s3, s4. 2/6 systolic murmur at apex Abdomen: Soft, non-tender, non-distended, BS + x 4.  Extremities: No clubbing, cyanosis. DP/PT/Radials 2+ and equal bilaterally. + Bilateral lower extremity with Schappell appearance, upper thigh with pitting edema  Labs  No results for input(s): CKTOTAL, CKMB, TROPONINI in the last 72 hours. Lab Results  Component Value Date   WBC 6.0 04/21/2016   HGB 9.4 (L) 04/21/2016   HCT 29.7 (L) 04/21/2016   MCV 89.7 04/21/2016   PLT 221 04/21/2016    Recent Labs Lab 04/21/16 0533  NA 134*  K 2.8*  CL 91*  CO2 29  BUN 73*  CREATININE 3.25*  CALCIUM 8.0*  PROT 7.1  BILITOT 1.3*  ALKPHOS 265*  ALT 16  AST 27  GLUCOSE 242*   No results found for: CHOL, HDL, LDLCALC, TRIG No results found for: DDIMER  Radiology/Studies  No results found.  ECG  Chronic atrial fibrillation with PVCs  ASSESSMENT AND PLAN  1. Pruritis: ?uremic. Elevated alk phos, further workup per hospitalist service  2. Hypokalemia: per internal medicine surgery, continue supplementation  3. Chronic systolic heart failure  - She says she has been compliant with her medication, her weight was actually lower than her discharge weight from [facility]   - Other than mild crackle in the right base and mild soft pitting edema in the upper thigh, she appears to be close to euvolemic. Given severe TR, she will always have significant JVD on exam. Despite moderate MR, she has not experienced any shortness breath, orthopnea or paroxysmal nocturnal dyspnea since recent discharge.   - Consider restarting home  dose Lasix, 80 mg twice a day.   - Although according to echocardiogram obtained at [facility] her RVSP was 40mHg and she has biventricular failure. Echocardiogram obtained today shows normal RVEF, mildly low LVEF. Current PA peak pressure 37 mmHg which is mildly elevated however not as severe. It is possible that the previous level was obtained during acute heart failure face. Her moderate MR may also increase right-sided pressure to some degree.  - EF decreased from 40-45% down to 30-35%. Outpatient myoview since asymptomatic.   4. Stage IV-V CKD: followed by nephrology as outpatient  5. CAD s/p 4v CABG: no obvious angina  6. Chronic atrial fibrillation: coumadin stopped during previous hospitalization in July 2017, restarted during current admission. HR controlled  - CHA2DS2-Vasc score  6 (HTN, DM, female, CAD, HF, age)  SWeston BrassHWoodward Ku8/02/2016, 1:15 PM  The patient was seen and examined, and I agree with the history, physical exam, assessment and plan as documented above which has been discussed with HJanan Ridge with modifications as noted below. Pt with aforementioned CV history and presentation. Consulted for acute heart failure. She denies chest pain and there has been no change in her chronic exertional dyspnea. Denies orthopnea and PND. Complains of some mild right inner thigh swelling, but says lower leg swelling is "the same as it always is". Her current weight is lower than her discharge weight from [facility]  She actually appears to be fairly well compensated. Will obtain a chest xray.  EF has dropped to 30-35%. In light of CABG several years ago, will need an outpatient nuclear MPI study to evaluate for ischemia. I recommend restarting home dose oral Lasix and decreasing metolazone to 5 mg every other day to start, and perhaps decreasing even further over time.  Currently on ASA, Lipitor, and metoprolol as outpatient. She is normotensive. Given EF 30-35%,  will switch metoprolol tartrate to succinate. Not a candidate for ACEI/ARB/Entresto due to CKD stage IV.   SKate Sable MD, [facility] 04/21/2016 2:15 PM

## 2016-04-21 NOTE — Progress Notes (Signed)
  Echocardiogram 2D Echocardiogram has been performed.  Cassandra Bonilla 04/21/2016, 11:07 AM

## 2016-04-21 NOTE — Progress Notes (Signed)
PROGRESS NOTE    Cassandra Bonilla  [id number] DOB: 12/01/1940 DOA: 04/20/2016 PCP: Marco Collie, MD   Outpatient Specialists: Delsa Grana)- nephrology    Brief Narrative:  Cassandra Bonilla is a 75 y.o. female with a past medical history significant for Afib not on anticoagulation (due to anemia?), chronic systolic CHF EF [id number], anemia, CKD IV-V, and DM who presents with itching but is found to have hypokalemia again. Per history, the patient was ambulatory and able to climb "13 flights of stairs to our apartment" in [place] as recently as a year ago, but in February had a flare of CHF and worsening of her renal function (previously baseline Cr high 1s, subsequently 2-3), and since then has been more debilitated, walking only with a walker.  Two months ago she moved with her husband from [place], and was admitted with hypokalemia here 5 weeks ago.  Husband states this is the 6th hospitalization this calandar year.  Records from previous hospital reviewed and are in [place]. Echocardiogram in May 2017 in [place]: EF 40-45% Severe PH Moderate to severe TR   Assessment & Plan:   Principal Problem:   Hypokalemia Active Problems:   Anemia of chronic disease   Chronic atrial fibrillation (HCC)   Type 2 diabetes mellitus with stage 4 chronic kidney disease, with long-term current use of insulin (HCC)   Chronic systolic congestive heart failure (HCC)   CKD (chronic kidney disease), stage IV (HCC)   Fluid overload    Hypokalemia:  60 mEq given in ER. -Repeat potassium still low- replace again -Mg low, replaced IV -recheck at 4PM and in AM  Acute on Chronic systolic and right sided CHF:   From CKD and right sided and systolic CHF -lasix IV plus PO metolazone -cardiology consult EF 40-45% in May at last echocardiogram in [place].  Pulmonary HTN.   Not on lisinopril because of renal insufficiency.  Echo read here pending -strict I/Os, daily weights  Anemia of chronic renal insufficiency:    Stable  Atrial fibrillation:  CHADS2-VASc 5.  Was on warfarin but this was stopped at some point because of her anemia.  There was never any bleeding event (melena or hematochezia), just that she was anemic.  -Lovenox for Afib dosing -Warfarin dosed per pharmacy -Continue metoprolol  CKD IV-V:  No fistula in place. -established in [place] with nephrology  IDDM:  -Hold home glargine -Sliding scale corrections, renal dose  Coronary artery disease and HTN: History of CABG, PCI 2, CEA. -Continue amlodipine, furosemide, metolazone, metorpolol, and statin      DVT prophylaxis:  Lovenox to coumadin  Code Status: Full Code   Family Communication: Husband at bedside  Disposition Plan:     Consultants:   cardiology  Procedures:        Subjective: Sleepy today Wants to go home  Objective: Vitals:   04/20/16 2000 04/20/16 2109 04/21/16 0623 04/21/16 0704  BP: 120/56 140/82 134/80   Pulse: 96 100 99   Resp: 15 18 18    Temp:  98.2 F (36.8 C) 98 F (36.7 C)   TempSrc:  Oral Oral   SpO2: 99% 97% 95%   Weight:   82 kg (180 lb 12.4 oz) 77.9 kg (171 lb 11.8 oz)    Intake/Output Summary (Last 24 hours) at 04/21/16 1122 Last data filed at 04/21/16 0900  Gross per 24 hour  Intake              240 ml  Output  0 ml  Net              240 ml   Filed Weights   04/20/16 1617 04/21/16 0623 04/21/16 0704  Weight: 79.8 kg (176 lb) 82 kg (180 lb 12.4 oz) 77.9 kg (171 lb 11.8 oz)    Examination:  General exam: sleepy Respiratory system: Crackles, no wheezing Cardiovascular system: S1 & S2 heard, RRR. No JVD, murmurs, rubs, gallops or clicks- edema to thighs Gastrointestinal system: Abdomen is nondistended, soft and nontender. No organomegaly or masses felt. Normal bowel sounds heard. Extremities: moves all 4 ext     Data Reviewed: I have personally reviewed following labs and imaging studies  CBC:  Recent Labs Lab 04/20/16 1630  04/21/16 0533  WBC 5.5 6.0  HGB 9.9* 9.4*  HCT 31.4* 29.7*  MCV 89.5 89.7  PLT 252 [id number]   Basic Metabolic Panel:  Recent Labs Lab 04/20/16 1630 04/20/16 2127 04/21/16 0533  NA 134*  --  134*  K 2.4*  --  2.8*  CL 91*  --  91*  CO2 27  --  29  GLUCOSE 179*  --  242*  BUN 70*  --  73*  CREATININE 3.12*  --  3.25*  CALCIUM 8.3*  --  8.0*  MG  --  1.6*  --    GFR: Estimated Creatinine Clearance: 14.7 mL/min (by C-G formula based on SCr of 3.25 mg/dL). Liver Function Tests:  Recent Labs Lab 04/20/16 1630 04/21/16 0533  AST 27 27  ALT 15 16  ALKPHOS 258* 265*  BILITOT 1.4* 1.3*  PROT 7.2 7.1  ALBUMIN 2.7* 2.6*   No results for input(s): LIPASE, AMYLASE in the last 168 hours. No results for input(s): AMMONIA in the last 168 hours. Coagulation Profile:  Recent Labs Lab 04/20/16 2127 04/21/16 0533  INR 1.32 1.33   Cardiac Enzymes: No results for input(s): CKTOTAL, CKMB, CKMBINDEX, TROPONINI in the last 168 hours. BNP (last 3 results) No results for input(s): PROBNP in the last 8760 hours. HbA1C: No results for input(s): HGBA1C in the last 72 hours. CBG:  Recent Labs Lab 04/20/16 2119 04/21/16 0748  GLUCAP 181* 207*   Lipid Profile: No results for input(s): CHOL, HDL, LDLCALC, TRIG, CHOLHDL, LDLDIRECT in the last 72 hours. Thyroid Function Tests:  Recent Labs  04/20/16 2127  TSH 2.380   Anemia Panel: No results for input(s): VITAMINB12, FOLATE, FERRITIN, TIBC, IRON, RETICCTPCT in the last 72 hours. Urine analysis:    Component Value Date/Time   COLORURINE YELLOW 03/22/2016 0100   APPEARANCEUR CLEAR 03/22/2016 0100   LABSPEC 1.013 03/22/2016 0100   PHURINE 6.5 03/22/2016 0100   GLUCOSEU NEGATIVE 03/22/2016 0100   HGBUR NEGATIVE 03/22/2016 0100   BILIRUBINUR NEGATIVE 03/22/2016 0100   KETONESUR NEGATIVE 03/22/2016 0100   PROTEINUR 100 (A) 03/22/2016 0100   NITRITE NEGATIVE 03/22/2016 0100   LEUKOCYTESUR NEGATIVE 03/22/2016 0100     )No  results found for this or any previous visit (from the past 240 hour(s)).    Anti-infectives    None       Radiology Studies: No results found.      Scheduled Meds: . allopurinol  100 mg Oral Daily  . amLODipine  10 mg Oral Daily  . aspirin EC  81 mg Oral Daily  . atorvastatin  40 mg Oral Daily  . enoxaparin (LOVENOX) injection  80 mg Subcutaneous QHS  . insulin aspart  0-5 Units Subcutaneous QHS  . insulin aspart  0-9 Units Subcutaneous  TID WC  . metolazone  5 mg Oral Daily  . metoprolol tartrate  25 mg Oral BID  . pantoprazole  40 mg Oral BID  . potassium chloride  40 mEq Oral Daily  . sodium chloride flush  3 mL Intravenous Q12H  . warfarin  4 mg Oral ONCE-1800  . Warfarin - Pharmacist Dosing Inpatient   Does not apply q1800   Continuous Infusions:    LOS: 0 days    Time spent: 25 min    [place], DO [facility] Pager [phone]  If 7PM-7AM, please contact night-coverage [email or web address] Password [email or web address] 04/21/2016, 11:22 AM

## 2016-04-22 ENCOUNTER — Observation Stay (HOSPITAL_COMMUNITY): Payer: Medicare Other

## 2016-04-22 DIAGNOSIS — Z87891 Personal history of nicotine dependence: Secondary | ICD-10-CM | POA: Diagnosis not present

## 2016-04-22 DIAGNOSIS — G9340 Encephalopathy, unspecified: Secondary | ICD-10-CM | POA: Diagnosis not present

## 2016-04-22 DIAGNOSIS — E8770 Fluid overload, unspecified: Secondary | ICD-10-CM | POA: Diagnosis not present

## 2016-04-22 DIAGNOSIS — R05 Cough: Secondary | ICD-10-CM | POA: Diagnosis not present

## 2016-04-22 DIAGNOSIS — Z9861 Coronary angioplasty status: Secondary | ICD-10-CM | POA: Diagnosis not present

## 2016-04-22 DIAGNOSIS — E871 Hypo-osmolality and hyponatremia: Secondary | ICD-10-CM | POA: Diagnosis present

## 2016-04-22 DIAGNOSIS — I272 Other secondary pulmonary hypertension: Secondary | ICD-10-CM | POA: Diagnosis present

## 2016-04-22 DIAGNOSIS — K753 Granulomatous hepatitis, not elsewhere classified: Secondary | ICD-10-CM | POA: Diagnosis not present

## 2016-04-22 DIAGNOSIS — L299 Pruritus, unspecified: Secondary | ICD-10-CM | POA: Diagnosis not present

## 2016-04-22 DIAGNOSIS — N179 Acute kidney failure, unspecified: Secondary | ICD-10-CM

## 2016-04-22 DIAGNOSIS — Z7982 Long term (current) use of aspirin: Secondary | ICD-10-CM | POA: Diagnosis not present

## 2016-04-22 DIAGNOSIS — R945 Abnormal results of liver function studies: Secondary | ICD-10-CM | POA: Diagnosis not present

## 2016-04-22 DIAGNOSIS — M19012 Primary osteoarthritis, left shoulder: Secondary | ICD-10-CM | POA: Diagnosis not present

## 2016-04-22 DIAGNOSIS — I248 Other forms of acute ischemic heart disease: Secondary | ICD-10-CM | POA: Diagnosis present

## 2016-04-22 DIAGNOSIS — Z794 Long term (current) use of insulin: Secondary | ICD-10-CM | POA: Diagnosis not present

## 2016-04-22 DIAGNOSIS — R7989 Other specified abnormal findings of blood chemistry: Secondary | ICD-10-CM | POA: Diagnosis not present

## 2016-04-22 DIAGNOSIS — J449 Chronic obstructive pulmonary disease, unspecified: Secondary | ICD-10-CM | POA: Diagnosis present

## 2016-04-22 DIAGNOSIS — E1122 Type 2 diabetes mellitus with diabetic chronic kidney disease: Secondary | ICD-10-CM | POA: Diagnosis not present

## 2016-04-22 DIAGNOSIS — M47816 Spondylosis without myelopathy or radiculopathy, lumbar region: Secondary | ICD-10-CM | POA: Diagnosis not present

## 2016-04-22 DIAGNOSIS — R188 Other ascites: Secondary | ICD-10-CM | POA: Diagnosis present

## 2016-04-22 DIAGNOSIS — I482 Chronic atrial fibrillation: Secondary | ICD-10-CM | POA: Diagnosis not present

## 2016-04-22 DIAGNOSIS — N184 Chronic kidney disease, stage 4 (severe): Secondary | ICD-10-CM | POA: Diagnosis not present

## 2016-04-22 DIAGNOSIS — R0602 Shortness of breath: Secondary | ICD-10-CM | POA: Diagnosis not present

## 2016-04-22 DIAGNOSIS — R000 Tachycardia, unspecified: Secondary | ICD-10-CM | POA: Diagnosis present

## 2016-04-22 DIAGNOSIS — Z741 Need for assistance with personal care: Secondary | ICD-10-CM | POA: Diagnosis not present

## 2016-04-22 DIAGNOSIS — I5022 Chronic systolic (congestive) heart failure: Secondary | ICD-10-CM | POA: Diagnosis not present

## 2016-04-22 DIAGNOSIS — E1029 Type 1 diabetes mellitus with other diabetic kidney complication: Secondary | ICD-10-CM | POA: Diagnosis not present

## 2016-04-22 DIAGNOSIS — K7290 Hepatic failure, unspecified without coma: Secondary | ICD-10-CM | POA: Diagnosis not present

## 2016-04-22 DIAGNOSIS — M25552 Pain in left hip: Secondary | ICD-10-CM | POA: Diagnosis not present

## 2016-04-22 DIAGNOSIS — R748 Abnormal levels of other serum enzymes: Secondary | ICD-10-CM | POA: Diagnosis not present

## 2016-04-22 DIAGNOSIS — I129 Hypertensive chronic kidney disease with stage 1 through stage 4 chronic kidney disease, or unspecified chronic kidney disease: Secondary | ICD-10-CM | POA: Diagnosis not present

## 2016-04-22 DIAGNOSIS — D631 Anemia in chronic kidney disease: Secondary | ICD-10-CM | POA: Diagnosis not present

## 2016-04-22 DIAGNOSIS — I2510 Atherosclerotic heart disease of native coronary artery without angina pectoris: Secondary | ICD-10-CM

## 2016-04-22 DIAGNOSIS — I509 Heart failure, unspecified: Secondary | ICD-10-CM | POA: Diagnosis not present

## 2016-04-22 DIAGNOSIS — N189 Chronic kidney disease, unspecified: Secondary | ICD-10-CM | POA: Diagnosis not present

## 2016-04-22 DIAGNOSIS — M6281 Muscle weakness (generalized): Secondary | ICD-10-CM | POA: Diagnosis not present

## 2016-04-22 DIAGNOSIS — R079 Chest pain, unspecified: Secondary | ICD-10-CM | POA: Diagnosis not present

## 2016-04-22 DIAGNOSIS — N185 Chronic kidney disease, stage 5: Secondary | ICD-10-CM | POA: Diagnosis present

## 2016-04-22 DIAGNOSIS — K769 Liver disease, unspecified: Secondary | ICD-10-CM | POA: Diagnosis not present

## 2016-04-22 DIAGNOSIS — K754 Autoimmune hepatitis: Secondary | ICD-10-CM | POA: Diagnosis present

## 2016-04-22 DIAGNOSIS — M179 Osteoarthritis of knee, unspecified: Secondary | ICD-10-CM | POA: Diagnosis not present

## 2016-04-22 DIAGNOSIS — M109 Gout, unspecified: Secondary | ICD-10-CM | POA: Diagnosis present

## 2016-04-22 DIAGNOSIS — Z803 Family history of malignant neoplasm of breast: Secondary | ICD-10-CM | POA: Diagnosis not present

## 2016-04-22 DIAGNOSIS — Z888 Allergy status to other drugs, medicaments and biological substances status: Secondary | ICD-10-CM | POA: Diagnosis not present

## 2016-04-22 DIAGNOSIS — I130 Hypertensive heart and chronic kidney disease with heart failure and stage 1 through stage 4 chronic kidney disease, or unspecified chronic kidney disease: Secondary | ICD-10-CM | POA: Diagnosis not present

## 2016-04-22 DIAGNOSIS — E876 Hypokalemia: Secondary | ICD-10-CM | POA: Diagnosis not present

## 2016-04-22 DIAGNOSIS — Z91041 Radiographic dye allergy status: Secondary | ICD-10-CM | POA: Diagnosis not present

## 2016-04-22 DIAGNOSIS — R262 Difficulty in walking, not elsewhere classified: Secondary | ICD-10-CM | POA: Diagnosis not present

## 2016-04-22 DIAGNOSIS — I5023 Acute on chronic systolic (congestive) heart failure: Secondary | ICD-10-CM | POA: Diagnosis not present

## 2016-04-22 DIAGNOSIS — R410 Disorientation, unspecified: Secondary | ICD-10-CM | POA: Diagnosis not present

## 2016-04-22 LAB — HEPATIC FUNCTION PANEL
ALK PHOS: 269 U/L — AB (ref 38–126)
ALT: 17 U/L (ref 14–54)
AST: 32 U/L (ref 15–41)
Albumin: 2.8 g/dL — ABNORMAL LOW (ref 3.5–5.0)
BILIRUBIN INDIRECT: 0.6 mg/dL (ref 0.3–0.9)
BILIRUBIN TOTAL: 1.1 mg/dL (ref 0.3–1.2)
Bilirubin, Direct: 0.5 mg/dL (ref 0.1–0.5)
Total Protein: 7.2 g/dL (ref 6.5–8.1)

## 2016-04-22 LAB — BLOOD GAS, ARTERIAL
Acid-Base Excess: 4 mmol/L — ABNORMAL HIGH (ref 0.0–2.0)
BICARBONATE: 27.4 meq/L — AB (ref 20.0–24.0)
DRAWN BY: 27407
FIO2: 0.21
O2 Saturation: 96.1 %
PH ART: 7.487 — AB (ref 7.350–7.450)
PO2 ART: 80.5 mmHg (ref 80.0–100.0)
Patient temperature: 98.6
TCO2: 28.5 mmol/L (ref 0–100)
pCO2 arterial: 36.6 mmHg (ref 35.0–45.0)

## 2016-04-22 LAB — BASIC METABOLIC PANEL
ANION GAP: 13 (ref 5–15)
Anion gap: 17 — ABNORMAL HIGH (ref 5–15)
BUN: 75 mg/dL — ABNORMAL HIGH (ref 6–20)
BUN: 75 mg/dL — AB (ref 6–20)
CALCIUM: 8.4 mg/dL — AB (ref 8.9–10.3)
CHLORIDE: 93 mmol/L — AB (ref 101–111)
CO2: 28 mmol/L (ref 22–32)
CO2: 22 mmol/L (ref 22–32)
CREATININE: 3.49 mg/dL — AB (ref 0.44–1.00)
CREATININE: 3.52 mg/dL — AB (ref 0.44–1.00)
Calcium: 8.3 mg/dL — ABNORMAL LOW (ref 8.9–10.3)
Chloride: 95 mmol/L — ABNORMAL LOW (ref 101–111)
GFR calc Af Amer: 14 mL/min — ABNORMAL LOW (ref >60)
GFR calc non Af Amer: 12 mL/min — ABNORMAL LOW (ref >60)
GFR calc non Af Amer: 12 mL/min — ABNORMAL LOW (ref >60)
GFR, EST AFRICAN AMERICAN: 14 mL/min — AB (ref >60)
GLUCOSE: 193 mg/dL — AB (ref 65–99)
Glucose, Bld: 144 mg/dL — ABNORMAL HIGH (ref 65–99)
Potassium: 3.7 mmol/L (ref 3.5–5.1)
Potassium: 3.9 mmol/L (ref 3.5–5.1)
SODIUM: 134 mmol/L — AB (ref 135–145)
Sodium: 134 mmol/L — ABNORMAL LOW (ref 135–145)

## 2016-04-22 LAB — GLUCOSE, CAPILLARY
GLUCOSE-CAPILLARY: 188 mg/dL — AB (ref 65–99)
Glucose-Capillary: 143 mg/dL — ABNORMAL HIGH (ref 65–99)
Glucose-Capillary: 133 mg/dL — ABNORMAL HIGH (ref 65–99)
Glucose-Capillary: 165 mg/dL — ABNORMAL HIGH (ref 65–99)

## 2016-04-22 LAB — CBC
HCT: 31.1 % — ABNORMAL LOW (ref 36.0–46.0)
Hemoglobin: 9.8 g/dL — ABNORMAL LOW (ref 12.0–15.0)
MCH: 28.1 pg (ref 26.0–34.0)
MCHC: 31.5 g/dL (ref 30.0–36.0)
MCV: 89.1 fL (ref 78.0–100.0)
PLATELETS: 245 10*3/uL (ref 150–400)
RBC: 3.49 MIL/uL — ABNORMAL LOW (ref 3.87–5.11)
RDW: 18.9 % — AB (ref 11.5–15.5)
WBC: 6 10*3/uL (ref 4.0–10.5)

## 2016-04-22 LAB — HEMOGLOBIN A1C
Hgb A1c MFr Bld: 7.7 % — ABNORMAL HIGH (ref 4.8–5.6)
MEAN PLASMA GLUCOSE: 174 mg/dL

## 2016-04-22 LAB — AMMONIA: AMMONIA: 51 umol/L — AB (ref 9–35)

## 2016-04-22 LAB — MAGNESIUM: Magnesium: 1.9 mg/dL (ref 1.7–2.4)

## 2016-04-22 LAB — PROTIME-INR
INR: 1.33
PROTHROMBIN TIME: 16.6 s — AB (ref 11.4–15.2)

## 2016-04-22 MED ORDER — FUROSEMIDE 10 MG/ML IJ SOLN
80.0000 mg | Freq: Four times a day (QID) | INTRAMUSCULAR | Status: DC
  Administered 2016-04-22 – 2016-04-23 (×3): 80 mg via INTRAVENOUS
  Filled 2016-04-22 (×4): qty 8

## 2016-04-22 MED ORDER — WARFARIN SODIUM 4 MG PO TABS
4.0000 mg | ORAL_TABLET | Freq: Once | ORAL | Status: AC
  Administered 2016-04-22: 4 mg via ORAL
  Filled 2016-04-22: qty 1

## 2016-04-22 NOTE — Progress Notes (Signed)
SUBJECTIVE:  Breathing is ok  OBJECTIVE:   Vitals:   Vitals:   04/21/16 2106 04/22/16 0601 04/22/16 1352 04/22/16 1354  BP: 132/66 129/68 (!) 133/58   Pulse: 77 95 71   Resp: 18 18 15    Temp: 98.4 F (36.9 C) 97.8 F (36.6 C)  98.1 F (36.7 C)  TempSrc: Oral Oral  Oral  SpO2: 98% 100%    Weight:  174 lb 13.2 oz (79.3 kg)     I&O's:   Intake/Output Summary (Last 24 hours) at 04/22/16 1733 Last data filed at 04/22/16 0945  Gross per 24 hour  Intake              120 ml  Output                0 ml  Net              120 ml   TELEMETRY: Reviewed telemetry pt in AFib, 5 beat run of wide complex tachycardia:     PHYSICAL EXAM General: Well developed, well nourished, in no acute distress Head:   Normal cephalic and atramatic  Lungs:   Clear bilaterally to auscultation. Heart:  Irregularly irregular S1 S2  No JVD.   Abdomen: abdomen soft and non-tender Msk:  Back normal,  Normal strength and tone for age. Extremities:   No edema.   Neuro: Alert and oriented. Psych:  Normal affect, responds appropriately Skin: No rash   LABS: Basic Metabolic Panel:  Recent Labs  04/20/16 2127  04/22/16 0425 04/22/16 1152  NA  --   < > 134* 134*  K  --   < > 3.7 3.9  CL  --   < > 93* 95*  CO2  --   < > 28 22  GLUCOSE  --   < > 144* 193*  BUN  --   < > 75* 75*  CREATININE  --   < > 3.49* 3.52*  CALCIUM  --   < > 8.3* 8.4*  MG 1.6*  --  1.9  --   < > = values in this interval not displayed. Liver Function Tests:  Recent Labs  04/21/16 0533 04/22/16 1152  AST 27 32  ALT 16 17  ALKPHOS 265* 269*  BILITOT 1.3* 1.1  PROT 7.1 7.2  ALBUMIN 2.6* 2.8*   No results for input(s): LIPASE, AMYLASE in the last 72 hours. CBC:  Recent Labs  04/21/16 0533 04/22/16 0425  WBC 6.0 6.0  HGB 9.4* 9.8*  HCT 29.7* 31.1*  MCV 89.7 89.1  PLT 221 245   Cardiac Enzymes: No results for input(s): CKTOTAL, CKMB, CKMBINDEX, TROPONINI in the last 72 hours. BNP: Invalid input(s):  POCBNP D-Dimer: No results for input(s): DDIMER in the last 72 hours. Hemoglobin A1C:  Recent Labs  04/20/16 2127  HGBA1C 7.7*   Fasting Lipid Panel: No results for input(s): CHOL, HDL, LDLCALC, TRIG, CHOLHDL, LDLDIRECT in the last 72 hours. Thyroid Function Tests:  Recent Labs  04/20/16 2127  TSH 2.380   Anemia Panel: No results for input(s): VITAMINB12, FOLATE, FERRITIN, TIBC, IRON, RETICCTPCT in the last 72 hours. Coag Panel:   Lab Results  Component Value Date   INR 1.33 04/22/2016   INR 1.33 04/21/2016   INR 1.32 04/20/2016    RADIOLOGY: Dg Chest 2 View  Result Date: 04/21/2016 CLINICAL DATA:  75 year old female with history of acute on chronic systolic heart failure. EXAM: CHEST  2 VIEW COMPARISON:  Chest x-ray 03/22/2016.  FINDINGS: Lung volumes are low. There is cephalization of the pulmonary vasculature and slight indistinctness of the interstitial markings suggestive of mild pulmonary edema. Small bilateral pleural effusions. Mild cardiomegaly. Upper mediastinal contours are within normal limits. Aortic atherosclerosis. Status post median sternotomy. Calcified granuloma projecting over the right upper lobe. IMPRESSION: 1. Persistent evidence of congestive heart failure, as above. 2. Aortic atherosclerosis. Electronically Signed   By: Vinnie Langton M.D.   On: 04/21/2016 15:14   US Abdomen Limited  Result Date: 04/22/2016 CLINICAL DATA:  Elevated liver enzymes EXAM: US ABDOMEN LIMITED - RIGHT UPPER QUADRANT COMPARISON:  None. FINDINGS: Gallbladder: Surgically removed Common bile duct: Diameter: 2.3 mm. Liver: No focal mass lesion is noted. Echogenic portal venous walls are seen. This may be related to a component of underlying hepatitis. Bidirectional flow in the portal vein is noted. IMPRESSION: Status post cholecystectomy. Changes suggestive of underlying hepatic inflammatory change. Electronically Signed   By: Inez Catalina M.D.   On: 04/22/2016 16:04       ASSESSMENT: Cassandra Bonilla:    1) Chronic systolic heart failure:  Appears euvolemic.  COntinue with decreased metolazone.  No ACE-I due to chronic kidney disease.  Went over meds extensively with the family.   2) CAD:  No angina.  3) Chronic AFib. Rate control.  COumadin for anticoagulation.   Cassandra Booze, MD  04/22/2016  5:33 PM

## 2016-04-22 NOTE — Care Management Note (Signed)
Case Management Note  Patient Details  Name: Cassandra Bonilla MRN: [id number] Date of Birth: 19-Dec-1940  Subjective/Objective:                 Spoke with patient and spouse in the room. Spouse states that patient was recently at [facility] for [place]. Pt rec HH PT. They are currently active with [facility]. No DME needed.   Action/Plan:  CM will fax info to [facility] at [place].    Expected Discharge Date:                  Expected Discharge Plan:  [place]  In-House Referral:     Discharge planning Services  CM Consult  Post Acute Care Choice:  NA Choice offered to:  Patient, Spouse  DME Arranged:  N/A DME Agency:  NA  HH Arranged:  RN, PT [place] Agency:  [place]  Status of Service:  In process, will continue to follow  If discussed at Long Length of Stay Meetings, dates discussed:    Additional Comments:  Carles Collet, RN 04/22/2016, 11:54 AM

## 2016-04-22 NOTE — Evaluation (Signed)
Physical Therapy Evaluation Patient Details Name: Cassandra Bonilla MRN: [id number] DOB: [birth year] Today's Date: 04/22/2016   History of Present Illness  Very pleasant 75 yo female with onset of CHF and hypokalemia, on coumadin and home with husband.  Had been to SNF and then home about 2 weeks, now back to hospital.  PMHx:  CHF, CKD, COPD, CAD, DM, gout, a-fib  Clinical Impression  Pt is up to walk with PT to chair, short trip mainly transitioning.  Has steps to enter house and will try to get her up several before dc home is planned.  Follow acutely for mobility training to transition to [place] with husband and family, all very involved with pt's care.    Follow Up Recommendations Home health PT;Supervision/Assistance - 24 hour    Equipment Recommendations  None recommended by PT    Recommendations for Other Services Rehab consult     Precautions / Restrictions Precautions Precautions: Fall (telemetry) Restrictions Weight Bearing Restrictions: No      Mobility  Bed Mobility Overal bed mobility: Needs Assistance Bed Mobility: Supine to Sit     Supine to sit: Min assist;Mod assist     General bed mobility comments: assisted her to scoot out to EOB  Transfers Overall transfer level: Needs assistance Equipment used: Rolling walker (2 wheeled);1 person hand held assist Transfers: Sit to/from [facility] Sit to Stand: Min assist Stand pivot transfers: Min assist       General transfer comment: verbal and tactile cues to sequence and for safety  Ambulation/Gait             General Gait Details: transfer to chair mainly  Stairs            Wheelchair Mobility    Modified Rankin (Stroke Patients Only)       Balance Overall balance assessment: Needs assistance Sitting-balance support: Feet supported Sitting balance-Leahy Scale: Fair     Standing balance support: Bilateral upper extremity supported Standing balance-Leahy Scale:  Poor Standing balance comment: pt is fatigued from standing                             Pertinent Vitals/Pain Pain Assessment: No/denies pain    Home Living Family/patient expects to be discharged to:: Private residence Living Arrangements: Spouse/significant other;Children Available Help at Discharge: Family;Available 24 hours/day Type of Home: House Home Access: Stairs to enter Entrance Stairs-Rails: Psychiatric nurse of Steps: 6 Home Layout: One level Home Equipment: Walker - 2 wheels;Shower seat      Prior Function Level of Independence: Needs assistance   Gait / Transfers Assistance Needed: RW with supervision  ADL's / Homemaking Assistance Needed: husband and children care for her  Comments: Recently moved from [place]   Dominant Hand: Right    Extremity/Trunk Assessment   Upper Extremity Assessment: Overall WFL for tasks assessed;LUE deficits/detail       LUE Deficits / Details: tired arm to use walker   Lower Extremity Assessment: Generalized weakness      Cervical / Trunk Assessment: Kyphotic  Communication   Communication: No difficulties  Cognition Arousal/Alertness: Awake/alert Behavior During Therapy: WFL for tasks assessed/performed Overall Cognitive Status: Within Functional Limits for tasks assessed                      General Comments General comments (skin integrity, edema, etc.): Pt has substantial step count to enter house but  able to take steps.  Will need to climb a short set to dc home and will add to POC    Exercises        Assessment/Plan    PT Assessment Patient needs continued PT services  PT Diagnosis Difficulty walking   PT Problem List Decreased strength;Decreased range of motion;Decreased activity tolerance;Decreased balance;Decreased mobility;Decreased coordination;Cardiopulmonary status limiting activity;Obesity  PT Treatment Interventions DME instruction;Gait  training;Stair training;Functional mobility training;Therapeutic activities;Therapeutic exercise;Balance training;Neuromuscular re-education;Patient/family education   PT Goals (Current goals can be found in the Care Plan section) Acute Rehab PT Goals Patient Stated Goal: to get home safely PT Goal Formulation: With patient/family Time For Goal Achievement: 05/06/16 Potential to Achieve Goals: Good    Frequency Min 3X/week   Barriers to discharge Inaccessible home environment (steps to enter house)      Co-evaluation               End of Session   Activity Tolerance: Patient tolerated treatment well;Patient limited by fatigue Patient left: in chair;with call bell/phone within reach;with chair alarm set;with family/visitor present Nurse Communication: Mobility status    Functional Assessment Tool Used: clinical judgment Functional Limitation: Mobility: Walking and moving around Mobility: Walking and Moving Around Current Status [id number]): At least 20 percent but less than 40 percent impaired, limited or restricted Mobility: Walking and Moving Around Goal Status [phone]): At least 1 percent but less than 20 percent impaired, limited or restricted    Time: 1029-1059 PT Time Calculation (min) (ACUTE ONLY): 30 min   Charges:   PT Evaluation $PT Eval Low Complexity: 1 Procedure PT Treatments $Therapeutic Activity: 8-22 mins   PT G Codes:   PT G-Codes **NOT FOR INPATIENT CLASS** Functional Assessment Tool Used: clinical judgment Functional Limitation: Mobility: Walking and moving around Mobility: Walking and Moving Around Current Status [id number]): At least 20 percent but less than 40 percent impaired, limited or restricted Mobility: Walking and Moving Around Goal Status [phone]): At least 1 percent but less than 20 percent impaired, limited or restricted    Ramond Dial 04/22/2016, 12:06 PM    Mee Hives, PT MS Acute Rehab Dept. Number: [place] [facility] [place]

## 2016-04-22 NOTE — Progress Notes (Signed)
PROGRESS NOTE    Cassandra Bonilla  [id number] DOB: Dec 31, 1940 DOA: 04/20/2016 PCP: Marco Collie, MD   Outpatient Specialists: Delsa Grana)- nephrology    Brief Narrative:  Cassandra Bonilla is a 75 y.o. female with a past medical history significant for Afib not on anticoagulation (due to anemia?), chronic systolic CHF EF [id number], anemia, CKD IV-V, and DM who presents with itching but is found to have hypokalemia again. Per history, the patient was ambulatory and able to climb "13 flights of stairs to our apartment" in [place] as recently as a year ago, but in February had a flare of CHF and worsening of her renal function (previously baseline Cr high 1s, subsequently 2-3), and since then has been more debilitated, walking only with a walker.  Two months ago she moved with her husband from [place], and was admitted with hypokalemia here 5 weeks ago.  Husband states this is the 6th hospitalization this calandar year.  Records from previous hospital reviewed and are in [place]. Echocardiogram in May 2017 in [place]: EF 40-45% Severe PH Moderate to severe TR   Assessment & Plan:   Principal Problem:   Hypokalemia Active Problems:   Anemia of chronic disease   Chronic atrial fibrillation (HCC)   Type 2 diabetes mellitus with stage 4 chronic kidney disease, with long-term current use of insulin (HCC)   Chronic systolic congestive heart failure (HCC)   CKD (chronic kidney disease), stage IV (HCC)   Fluid overload    Hypokalemia:  60 mEq given in ER. -Repeat potassium still low- replace again -Mg 1.9 -resolved.  -getting supplements.   Acute on Chronic systolic and right sided CHF:   From CKD and right sided and systolic CHF -lasix IV plus PO metolazone -cardiology consult EF 40-45% in May at last echocardiogram in [place].  Pulmonary HTN.   Not on lisinopril because of renal insufficiency.  Echo EF 35 %  -strict I/Os, daily weights  Anemia of chronic renal insufficiency: CKD IV-V:  No  fistula in place. Cr trending up at 3.4. Per records cr range 2.7---2.9 Will ask for strict I and O.  She has been confuse for last week per husband. Also patient sleepy, itching.. Will consult Nephrology, unclear if patient having uremic symptoms.   Encephalopathy, acute;  Sleepy, wake up answer some questions.  Will check ABG, Ammonia level.  Nephrology consulted, evaluation for uremia.   Transaminases; check [name].  Denies abdominal pain.   Atrial fibrillation:  CHADS2-VASc 5.  Was on warfarin but this was stopped at some point because of her anemia.  There was never any bleeding event (melena or hematochezia), just that she was anemic.  -Lovenox for Afib dosing -Warfarin dosed per pharmacy -Continue metoprolol  IDDM:  -Hold home glargine -Sliding scale corrections, renal dose  Coronary artery disease and HTN: History of CABG, PCI 2, CEA. -Continue amlodipine, furosemide, metolazone, metorpolol, and statin      DVT prophylaxis:  Lovenox to coumadin  Code Status: Full Code   Family Communication: Husband at bedside  Disposition Plan:   Remain inpatient.   Consultants:   Cardiology  Nephrology   Procedures:        Subjective: She is sleepy , per husband who was at bedside, patient would be awake at night and sleep during the day. Patient has been confuse on and off for last week. Generalized itching.   Objective: Vitals:   04/21/16 0704 04/21/16 1430 04/21/16 2106 04/22/16 0601  BP:  (!) 110/57 132/66 129/68  Pulse:  75 77 95  Resp:  17 18 18   Temp:  97.8 F (36.6 C) 98.4 F (36.9 C) 97.8 F (36.6 C)  TempSrc:  Oral Oral Oral  SpO2:  100% 98% 100%  Weight: 77.9 kg (171 lb 11.8 oz)   79.3 kg (174 lb 13.2 oz)    Intake/Output Summary (Last 24 hours) at 04/22/16 0931 Last data filed at 04/21/16 1245  Gross per 24 hour  Intake              240 ml  Output                0 ml  Net              240 ml   Filed Weights   04/21/16 0623  04/21/16 0704 04/22/16 0601  Weight: 82 kg (180 lb 12.4 oz) 77.9 kg (171 lb 11.8 oz) 79.3 kg (174 lb 13.2 oz)    Examination:  General exam: sleepy, wake up answer some questions.  Respiratory system: Crackles, no wheezing Cardiovascular system: S1 & S2 heard, RRR. No JVD, murmurs, rubs, gallops or clicks- edema to thighs Gastrointestinal system: Abdomen is nondistended, soft and nontender. No organomegaly or masses felt. Normal bowel sounds heard. Extremities: moves all 4 ext     Data Reviewed: I have personally reviewed following labs and imaging studies  CBC:  Recent Labs Lab 04/20/16 1630 04/21/16 0533 04/22/16 0425  WBC 5.5 6.0 6.0  HGB 9.9* 9.4* 9.8*  HCT 31.4* 29.7* 31.1*  MCV 89.5 89.7 89.1  PLT 252 221 [id number]   Basic Metabolic Panel:  Recent Labs Lab 04/20/16 1630 04/20/16 2127 04/21/16 0533 04/22/16 0425  NA 134*  --  134* 134*  K 2.4*  --  2.8* 3.7  CL 91*  --  91* 93*  CO2 27  --  29 28  GLUCOSE 179*  --  242* 144*  BUN 70*  --  73* 75*  CREATININE 3.12*  --  3.25* 3.49*  CALCIUM 8.3*  --  8.0* 8.3*  MG  --  1.6*  --  1.9   GFR: Estimated Creatinine Clearance: 13.8 mL/min (by C-G formula based on SCr of 3.49 mg/dL). Liver Function Tests:  Recent Labs Lab 04/20/16 1630 04/21/16 0533  AST 27 27  ALT 15 16  ALKPHOS 258* 265*  BILITOT 1.4* 1.3*  PROT 7.2 7.1  ALBUMIN 2.7* 2.6*   No results for input(s): LIPASE, AMYLASE in the last 168 hours. No results for input(s): AMMONIA in the last 168 hours. Coagulation Profile:  Recent Labs Lab 04/20/16 2127 04/21/16 0533 04/22/16 0425  INR 1.32 1.33 1.33   Cardiac Enzymes: No results for input(s): CKTOTAL, CKMB, CKMBINDEX, TROPONINI in the last 168 hours. BNP (last 3 results) No results for input(s): PROBNP in the last 8760 hours. HbA1C:  Recent Labs  04/20/16 2127  HGBA1C 7.7*   CBG:  Recent Labs Lab 04/21/16 0748 04/21/16 1212 04/21/16 1709 04/21/16 2104 04/22/16 0808  GLUCAP  207* 183* 158* 176* 143*   Lipid Profile: No results for input(s): CHOL, HDL, LDLCALC, TRIG, CHOLHDL, LDLDIRECT in the last 72 hours. Thyroid Function Tests:  Recent Labs  04/20/16 2127  TSH 2.380   Anemia Panel: No results for input(s): VITAMINB12, FOLATE, FERRITIN, TIBC, IRON, RETICCTPCT in the last 72 hours. Urine analysis:    Component Value Date/Time   COLORURINE YELLOW 03/22/2016 0100   APPEARANCEUR CLEAR 03/22/2016 0100   LABSPEC 1.013 03/22/2016 0100   PHURINE  6.5 03/22/2016 0100   GLUCOSEU NEGATIVE 03/22/2016 0100   HGBUR NEGATIVE 03/22/2016 0100   BILIRUBINUR NEGATIVE 03/22/2016 0100   KETONESUR NEGATIVE 03/22/2016 0100   PROTEINUR 100 (A) 03/22/2016 0100   NITRITE NEGATIVE 03/22/2016 0100   LEUKOCYTESUR NEGATIVE 03/22/2016 0100     )No results found for this or any previous visit (from the past 240 hour(s)).    Anti-infectives    None       Radiology Studies: Dg Chest 2 View  Result Date: 04/21/2016 CLINICAL DATA:  75 year old female with history of acute on chronic systolic heart failure. EXAM: CHEST  2 VIEW COMPARISON:  Chest x-ray 03/22/2016. FINDINGS: Lung volumes are low. There is cephalization of the pulmonary vasculature and slight indistinctness of the interstitial markings suggestive of mild pulmonary edema. Small bilateral pleural effusions. Mild cardiomegaly. Upper mediastinal contours are within normal limits. Aortic atherosclerosis. Status post median sternotomy. Calcified granuloma projecting over the right upper lobe. IMPRESSION: 1. Persistent evidence of congestive heart failure, as above. 2. Aortic atherosclerosis. Electronically Signed   By: Vinnie Langton M.D.   On: 04/21/2016 15:14        Scheduled Meds: . allopurinol  100 mg Oral Daily  . amLODipine  10 mg Oral Daily  . aspirin EC  81 mg Oral Daily  . atorvastatin  40 mg Oral Daily  . enoxaparin (LOVENOX) injection  80 mg Subcutaneous QHS  . furosemide  80 mg Oral BID  . insulin  aspart  0-5 Units Subcutaneous QHS  . insulin aspart  0-9 Units Subcutaneous TID WC  . [START ON 04/23/2016] metolazone  5 mg Oral QODAY  . metoprolol succinate  50 mg Oral Daily  . pantoprazole  40 mg Oral BID  . potassium chloride  40 mEq Oral BID  . sodium chloride flush  3 mL Intravenous Q12H  . Warfarin - Pharmacist Dosing Inpatient   Does not apply q1800   Continuous Infusions:    LOS: 0 days    Time spent: 25 min    Elmarie Shiley, MD [facility] Pager [phone]  If 7PM-7AM, please contact night-coverage [email or web address] Password [facility] 04/22/2016, 9:31 AM

## 2016-04-22 NOTE — Care Management Obs Status (Signed)
[place] NOTIFICATION   Patient Details  Name: Meddie Venneman MRN: [id number] Date of Birth: 1941/04/05   Medicare Observation Status Notification Given:  Yes    Carles Collet, RN 04/22/2016, 11:48 AM

## 2016-04-22 NOTE — Progress Notes (Signed)
ANTICOAGULATION CONSULT NOTE - Follow Up Consult  Pharmacy Consult for Lovenox and warfarin Indication: atrial fibrillation  Allergies  Allergen Reactions  . Contrast Media  [Iodinated Diagnostic Agents] Hives  . Prednisone Other (See Comments)    Confusion    Patient Measurements: Weight: 174 lb 13.2 oz (79.3 kg)  Vital Signs: Temp: 97.8 F (36.6 C) (08/07 0601) Temp Source: Oral (08/07 0601) BP: 129/68 (08/07 0601) Pulse Rate: 95 (08/07 0601)  Labs:  Recent Labs  04/20/16 1630 04/20/16 2127 04/21/16 0533 04/22/16 0425  HGB 9.9*  --  9.4* 9.8*  HCT 31.4*  --  29.7* 31.1*  PLT 252  --  221 245  LABPROT  --  16.5* 16.6* 16.6*  INR  --  1.32 1.33 1.33  CREATININE 3.12*  --  3.25* 3.49*    Estimated Creatinine Clearance: 13.8 mL/min (by C-G formula based on SCr of 3.49 mg/dL).   Assessment: 75 y/o female admitted 04/20/2016 with pruritis. She was on warfarin for hx Afib up until May when this was stopped due to concern for internal bleeding per husband. Warfarin resumed this admit and Lovenox added until INR >2. Pharmacy managing inpatient.  INR is subtherapeutic at 1.33 today. No bleeding noted, Hgb is low at 9.8 but stable, platelets are normal.  Goal of Therapy:  INR 2-3 Anti-Xa level 0.6-1 units/ml 4hrs after LMWH dose given Monitor platelets by anticoagulation protocol: Yes   Plan:  - Continue Lovenox 80 mg SQ qhs - Repeat warfarin 4 mg PO tonight - Daily INR, CBC q72h while on Lovenox - Monitor for s/sx of bleeding   Renold Genta, PharmD, BCPS Clinical Pharmacist Phone for today - [place] - [phone] 04/22/2016 11:06 AM

## 2016-04-22 NOTE — Consult Note (Signed)
[facility] Consult Note   Reason for Consult: CKD IV-V, ?Uremia Referring Physician: Dr. Tyrell Antonio Primary Nephrologist: Dr. Hollie Salk  HPI: Cassandra Bonilla is an 75 y.o. female with PMH of CHF (EF 30-35% TTE 04/21/16), CKD Stage IV-V, Atrial Fibrillation, CAD, HTN, IDDM, and Gout who was admitted with acute CHF exacerbation and pruritus.  Patient moved to [facility] from [place] about 2 months ago. She was noted to have nephrotic range proteinuria during admission in February 2017 at [facility], with known CKD for several years now. She was again admitted May 2017 at [facility] for decompensated biventricular heart failure (EF 40-45% at that time) and diuresed with discharge weight of 82.1 kg. Her SCr was stable at 2.0-2.9 since February, b/w 1.6-2.0 in 2014.   She was admitted at [facility] from 7/7 - 7/9 for 2 weeks hx of pruritis and hypokalemia. Bilirubin has been normal with elevated Alkaline phosphatase. She was discharged to a rehab facility and then to home and returned on 8/5 with persistent pruritis, hypokalemia, and CHF exacerbation. SCr has trended up to 3.49 from 3.12 on admission (2.72 last month). She is on her home dose Lasix 80 mg BID and home Metolazone 5 mg has been changed to every other day from daily. She and husband report good urine output (No UOP documented). Husband states that she is more confused the last few days. She is able to answer questions appropriately though is somnolent and unable to tell me the year. She says she feels about the same as her regular self. She has a several week history of non-productive cough. She denies any current pruritus after receiving medication. She has never been on dialysis.  PMH:   Past Medical History:  Diagnosis Date  . CHF (congestive heart failure) ([place])   . CKD (chronic kidney disease) stage 3, GFR 30-59 ml/min   . COPD (chronic obstructive pulmonary disease) ([place])   . Coronary artery disease   . Diabetes mellitus  without complication ([place])   . Gout   . Hypertension   . Hypokalemia 03/21/2016    PSH:   Past Surgical History:  Procedure Laterality Date  . CAROTID ENDARTERECTOMY    . CESAREAN SECTION    . CHOLECYSTECTOMY    . CORONARY ANGIOPLASTY    . CORONARY ARTERY BYPASS GRAFT      Allergies:  Allergies  Allergen Reactions  . Contrast Media  [Iodinated Diagnostic Agents] Hives  . Prednisone Other (See Comments)    Confusion    Medications:   Prior to Admission medications   Medication Sig Start Date End Date Taking? Authorizing Provider  acetaminophen (TYLENOL) 650 MG CR tablet Take 1,300 mg by mouth at bedtime as needed for pain.   Yes Historical Provider, MD  allopurinol (ZYLOPRIM) 100 MG tablet Take 100 mg by mouth daily.   Yes Historical Provider, MD  amLODipine (NORVASC) 10 MG tablet Take 10 mg by mouth daily.   Yes Historical Provider, MD  aspirin EC 81 MG tablet Take 81 mg by mouth daily.   Yes Historical Provider, MD  atorvastatin (LIPITOR) 40 MG tablet Take 40 mg by mouth daily.   Yes Historical Provider, MD  furosemide (LASIX) 80 MG tablet Take 1 tablet (80 mg total) by mouth 2 (two) times daily. 03/31/16  Yes Hosie Poisson, MD  guaiFENesin-codeine (ROBITUSSIN AC) 100-10 MG/5ML syrup Take 10 mLs by mouth 3 (three) times daily as needed for cough.   Yes Historical Provider, MD  hydrOXYzine (ATARAX/VISTARIL)  25 MG tablet Take 1-2 tablets (25-50 mg total) by mouth every 6 (six) hours as needed for itching. Patient taking differently: Take 25-50 mg by mouth See admin instructions. Take 2 tablets (50 mg) by mouth daily at bedtime, may also take 1 tablet (25 mg) 3 times during the day as needed for itching 03/24/16  Yes Hosie Poisson, MD  insulin glargine (LANTUS) 100 UNIT/ML injection Inject 5 Units into the skin every morning.    Yes Historical Provider, MD  insulin lispro (HUMALOG) 100 UNIT/ML injection Inject 8-10 Units into the skin at bedtime. Takes 8 units if CBG <400, takes 10  units if CBG >400    Yes Historical Provider, MD  metolazone (ZAROXOLYN) 5 MG tablet Take 5 mg by mouth daily.    Yes Historical Provider, MD  pantoprazole (PROTONIX) 40 MG tablet Take 40 mg by mouth daily.    Yes Historical Provider, MD  polyethylene glycol (MIRALAX / GLYCOLAX) packet Take 17 g by mouth daily as needed for mild constipation. Mix in 8 oz liquid and drink 10/27/15  Yes Historical Provider, MD    Discontinued Meds:   Medications Discontinued During This Encounter  Medication Reason  . ferrous sulfate 325 (65 FE) MG tablet   . gabapentin (NEURONTIN) 300 MG capsule   . guaiFENesin-dextromethorphan (ROBITUSSIN DM) 100-10 MG/5ML syrup   . sodium chloride 0.9 % bolus 500 mL   . potassium chloride SA (K-DUR,KLOR-CON) CR tablet 40 mEq   . furosemide (LASIX) injection 40 mg   . metolazone (ZAROXOLYN) tablet 5 mg   . metoprolol tartrate (LOPRESSOR) tablet 25 mg   . Calcium Carb-Cholecalciferol (GNP VITAMIN D-400) 90-400 MG-UNIT TABS Patient Preference  . metoprolol tartrate (LOPRESSOR) 25 MG tablet Discontinued by provider  . warfarin (COUMADIN) 2 MG tablet Discontinued by provider      Family History:   Family History  Problem Relation Age of Onset  . Breast cancer Mother     Social History:  reports that she has quit smoking. She has never used smokeless tobacco. She reports that she does not drink alcohol or use drugs. Review of Systems  Constitutional: Positive for chills. Negative for diaphoresis, fever and weight loss.  Respiratory: Positive for cough and wheezing. Negative for hemoptysis, sputum production and shortness of breath.   Cardiovascular: Positive for orthopnea. Negative for chest pain, palpitations and leg swelling.  Gastrointestinal: Negative for abdominal pain, blood in stool, constipation, diarrhea, melena, nausea and vomiting.  Genitourinary: Positive for frequency. Negative for dysuria, hematuria and urgency.  Musculoskeletal: Negative for myalgias.   Skin: Positive for itching. Negative for rash.  Neurological: Negative for tingling and headaches.    Blood pressure (!) 133/58, pulse 71, temperature 98.1 F (36.7 C), temperature source Oral, resp. rate 15, weight 174 lb 13.2 oz (79.3 kg), SpO2 100 %.  Physical Exam  Constitutional: No distress.  Somnolent woman, answers questions appropriately  HENT:  Head: Normocephalic and atraumatic.  Eyes: EOM are normal.  Neck: JVD present.  Cardiovascular: Normal rate and regular rhythm.   Respiratory:  Coarse breath sounds and wheezing bilaterally   GI: Soft. Bowel sounds are normal. She exhibits no distension. There is no guarding.  Mild tenderness RUQ  Musculoskeletal: She exhibits edema. She exhibits no tenderness.  +1 Pitting Edema Lower extremities (thighs and shins) with Pralle appearance, sacral edema  Neurological:  Oriented to person, knows she is in hospital but cannot name which one or city, cannot tell me the year. No asterixis.  Skin: Skin  is warm. No rash noted. She is not diaphoretic.    Creatinine, Ser  Date/Time Value Ref Range Status  04/22/2016 04:25 AM 3.49 (H) 0.44 - 1.00 mg/dL Final  04/21/2016 05:33 AM 3.25 (H) 0.44 - 1.00 mg/dL Final  04/20/2016 04:30 PM 3.12 (H) 0.44 - 1.00 mg/dL Final  03/23/2016 10:19 AM 2.72 (H) 0.44 - 1.00 mg/dL Final  03/22/2016 08:55 AM 2.85 (H) 0.44 - 1.00 mg/dL Final  03/21/2016 10:47 PM 2.97 (H) 0.44 - 1.00 mg/dL Final    Results for orders placed or performed during the hospital encounter of 04/20/16 (from the past 48 hour(s))  Comprehensive metabolic panel     Status: Abnormal   Collection Time: 04/20/16  4:30 PM  Result Value Ref Range   Sodium 134 (L) 135 - 145 mmol/L   Potassium 2.4 (LL) 3.5 - 5.1 mmol/L    Comment: CRITICAL RESULT CALLED TO, READ BACK BY AND VERIFIED WITH: A MCKNOWN,RN 04/20/16 1746 RHOLMES    Chloride 91 (L) 101 - 111 mmol/L   CO2 27 22 - 32 mmol/L   Glucose, Bld 179 (H) 65 - 99 mg/dL   BUN 70 (H) 6 -  20 mg/dL   Creatinine, Ser 3.12 (H) 0.44 - 1.00 mg/dL   Calcium 8.3 (L) 8.9 - 10.3 mg/dL   Total Protein 7.2 6.5 - 8.1 g/dL   Albumin 2.7 (L) 3.5 - 5.0 g/dL   AST 27 15 - 41 U/L   ALT 15 14 - 54 U/L   Alkaline Phosphatase 258 (H) 38 - 126 U/L   Total Bilirubin 1.4 (H) 0.3 - 1.2 mg/dL   GFR calc non Af Amer 14 (L) >60 mL/min   GFR calc Af Amer 16 (L) >60 mL/min    Comment: (NOTE) The eGFR has been calculated using the CKD EPI equation. This calculation has not been validated in all clinical situations. eGFR's persistently <60 mL/min signify possible Chronic Kidney Disease.    Anion gap 16 (H) 5 - 15  CBC     Status: Abnormal   Collection Time: 04/20/16  4:30 PM  Result Value Ref Range   WBC 5.5 4.0 - 10.5 K/uL   RBC 3.51 (L) 3.87 - 5.11 MIL/uL   Hemoglobin 9.9 (L) 12.0 - 15.0 g/dL   HCT 31.4 (L) 36.0 - 46.0 %   MCV 89.5 78.0 - 100.0 fL   MCH 28.2 26.0 - 34.0 pg   MCHC 31.5 30.0 - 36.0 g/dL   RDW 18.9 (H) 11.5 - 15.5 %   Platelets 252 150 - 400 K/uL  Glucose, capillary     Status: Abnormal   Collection Time: 04/20/16  9:19 PM  Result Value Ref Range   Glucose-Capillary 181 (H) 65 - 99 mg/dL  Hemoglobin A1c     Status: Abnormal   Collection Time: 04/20/16  9:27 PM  Result Value Ref Range   Hgb A1c MFr Bld 7.7 (H) 4.8 - 5.6 %    Comment: (NOTE)         Pre-diabetes: 5.7 - 6.4         Diabetes: >6.4         Glycemic control for adults with diabetes: <7.0    Mean Plasma Glucose 174 mg/dL    Comment: (NOTE) Performed At: [facility] [address] [place], [place] [id number] Lindon Romp MD [id number]   Brain natriuretic peptide     Status: Abnormal   Collection Time: 04/20/16  9:27 PM  Result Value Ref Range  B Natriuretic Peptide 2,581.1 (H) 0.0 - 100.0 pg/mL  TSH     Status: None   Collection Time: 04/20/16  9:27 PM  Result Value Ref Range   TSH 2.380 0.350 - 4.500 uIU/mL  Magnesium     Status: Abnormal   Collection Time: 04/20/16  9:27 PM   Result Value Ref Range   Magnesium 1.6 (L) 1.7 - 2.4 mg/dL  Protime-INR     Status: Abnormal   Collection Time: 04/20/16  9:27 PM  Result Value Ref Range   Prothrombin Time 16.5 (H) 11.4 - 15.2 seconds   INR 1.32   Comprehensive metabolic panel     Status: Abnormal   Collection Time: 04/21/16  5:33 AM  Result Value Ref Range   Sodium 134 (L) 135 - 145 mmol/L   Potassium 2.8 (L) 3.5 - 5.1 mmol/L   Chloride 91 (L) 101 - 111 mmol/L   CO2 29 22 - 32 mmol/L   Glucose, Bld 242 (H) 65 - 99 mg/dL   BUN 73 (H) 6 - 20 mg/dL   Creatinine, Ser 3.25 (H) 0.44 - 1.00 mg/dL   Calcium 8.0 (L) 8.9 - 10.3 mg/dL   Total Protein 7.1 6.5 - 8.1 g/dL   Albumin 2.6 (L) 3.5 - 5.0 g/dL   AST 27 15 - 41 U/L   ALT 16 14 - 54 U/L   Alkaline Phosphatase 265 (H) 38 - 126 U/L   Total Bilirubin 1.3 (H) 0.3 - 1.2 mg/dL   GFR calc non Af Amer 13 (L) >60 mL/min   GFR calc Af Amer 15 (L) >60 mL/min    Comment: (NOTE) The eGFR has been calculated using the CKD EPI equation. This calculation has not been validated in all clinical situations. eGFR's persistently <60 mL/min signify possible Chronic Kidney Disease.    Anion gap 14 5 - 15  CBC     Status: Abnormal   Collection Time: 04/21/16  5:33 AM  Result Value Ref Range   WBC 6.0 4.0 - 10.5 K/uL   RBC 3.31 (L) 3.87 - 5.11 MIL/uL   Hemoglobin 9.4 (L) 12.0 - 15.0 g/dL   HCT 29.7 (L) 36.0 - 46.0 %   MCV 89.7 78.0 - 100.0 fL   MCH 28.4 26.0 - 34.0 pg   MCHC 31.6 30.0 - 36.0 g/dL   RDW 18.8 (H) 11.5 - 15.5 %   Platelets 221 150 - 400 K/uL  Protime-INR     Status: Abnormal   Collection Time: 04/21/16  5:33 AM  Result Value Ref Range   Prothrombin Time 16.6 (H) 11.4 - 15.2 seconds   INR 1.33   Glucose, capillary     Status: Abnormal   Collection Time: 04/21/16  7:48 AM  Result Value Ref Range   Glucose-Capillary 207 (H) 65 - 99 mg/dL  Glucose, capillary     Status: Abnormal   Collection Time: 04/21/16 12:12 PM  Result Value Ref Range   Glucose-Capillary  183 (H) 65 - 99 mg/dL  Glucose, capillary     Status: Abnormal   Collection Time: 04/21/16  5:09 PM  Result Value Ref Range   Glucose-Capillary 158 (H) 65 - 99 mg/dL  Glucose, capillary     Status: Abnormal   Collection Time: 04/21/16  9:04 PM  Result Value Ref Range   Glucose-Capillary 176 (H) 65 - 99 mg/dL   Comment 1 Notify RN    Comment 2 Document in Chart   Basic metabolic panel     Status:  Abnormal   Collection Time: 04/22/16  4:25 AM  Result Value Ref Range   Sodium 134 (L) 135 - 145 mmol/L   Potassium 3.7 3.5 - 5.1 mmol/L    Comment: DELTA CHECK NOTED   Chloride 93 (L) 101 - 111 mmol/L   CO2 28 22 - 32 mmol/L   Glucose, Bld 144 (H) 65 - 99 mg/dL   BUN 75 (H) 6 - 20 mg/dL   Creatinine, Ser 3.49 (H) 0.44 - 1.00 mg/dL   Calcium 8.3 (L) 8.9 - 10.3 mg/dL   GFR calc non Af Amer 12 (L) >60 mL/min   GFR calc Af Amer 14 (L) >60 mL/min    Comment: (NOTE) The eGFR has been calculated using the CKD EPI equation. This calculation has not been validated in all clinical situations. eGFR's persistently <60 mL/min signify possible Chronic Kidney Disease.    Anion gap 13 5 - 15  Protime-INR     Status: Abnormal   Collection Time: 04/22/16  4:25 AM  Result Value Ref Range   Prothrombin Time 16.6 (H) 11.4 - 15.2 seconds   INR 1.33   CBC     Status: Abnormal   Collection Time: 04/22/16  4:25 AM  Result Value Ref Range   WBC 6.0 4.0 - 10.5 K/uL   RBC 3.49 (L) 3.87 - 5.11 MIL/uL   Hemoglobin 9.8 (L) 12.0 - 15.0 g/dL   HCT 31.1 (L) 36.0 - 46.0 %   MCV 89.1 78.0 - 100.0 fL   MCH 28.1 26.0 - 34.0 pg   MCHC 31.5 30.0 - 36.0 g/dL   RDW 18.9 (H) 11.5 - 15.5 %   Platelets 245 150 - 400 K/uL  Magnesium     Status: None   Collection Time: 04/22/16  4:25 AM  Result Value Ref Range   Magnesium 1.9 1.7 - 2.4 mg/dL  Glucose, capillary     Status: Abnormal   Collection Time: 04/22/16  8:08 AM  Result Value Ref Range   Glucose-Capillary 143 (H) 65 - 99 mg/dL  Hepatic function panel      Status: Abnormal   Collection Time: 04/22/16 11:52 AM  Result Value Ref Range   Total Protein 7.2 6.5 - 8.1 g/dL   Albumin 2.8 (L) 3.5 - 5.0 g/dL   AST 32 15 - 41 U/L   ALT 17 14 - 54 U/L   Alkaline Phosphatase 269 (H) 38 - 126 U/L   Total Bilirubin 1.1 0.3 - 1.2 mg/dL   Bilirubin, Direct 0.5 0.1 - 0.5 mg/dL   Indirect Bilirubin 0.6 0.3 - 0.9 mg/dL  Ammonia     Status: Abnormal   Collection Time: 04/22/16 11:52 AM  Result Value Ref Range   Ammonia 51 (H) 9 - 35 umol/L  Glucose, capillary     Status: Abnormal   Collection Time: 04/22/16 12:10 PM  Result Value Ref Range   Glucose-Capillary 188 (H) 65 - 99 mg/dL  Blood gas, arterial     Status: Abnormal   Collection Time: 04/22/16 12:18 PM  Result Value Ref Range   FIO2 0.21    Delivery systems ROOM AIR    pH, Arterial 7.487 (H) 7.350 - 7.450   pCO2 arterial 36.6 35.0 - 45.0 mmHg   pO2, Arterial 80.5 80.0 - 100.0 mmHg   Bicarbonate 27.4 (H) 20.0 - 24.0 mEq/L   TCO2 28.5 0 - 100 mmol/L   Acid-Base Excess 4.0 (H) 0.0 - 2.0 mmol/L   O2 Saturation 96.1 %   Patient  temperature 98.6    Collection site RIGHT RADIAL    Drawn by [phone]    Sample type ARTERIAL DRAW    Allens test (pass/fail) PASS PASS    Dg Chest 2 View  Result Date: 04/21/2016 CLINICAL DATA:  75 year old female with history of acute on chronic systolic heart failure. EXAM: CHEST  2 VIEW COMPARISON:  Chest x-ray 03/22/2016. FINDINGS: Lung volumes are low. There is cephalization of the pulmonary vasculature and slight indistinctness of the interstitial markings suggestive of mild pulmonary edema. Small bilateral pleural effusions. Mild cardiomegaly. Upper mediastinal contours are within normal limits. Aortic atherosclerosis. Status post median sternotomy. Calcified granuloma projecting over the right upper lobe. IMPRESSION: 1. Persistent evidence of congestive heart failure, as above. 2. Aortic atherosclerosis. Electronically Signed   By: Vinnie Langton M.D.   On:  04/21/2016 15:14     Assessment/Plan: 75 year old female with PMH of CHF (EF 30-35% TTE 04/21/16), CKD Stage IV-V (baseline SCr 2.0-2.5), Atrial Fibrillation, CAD, HTN, IDDM, and Gout who was admitted with acute CHF exacerbation and pruritus  1. AoCKD IV-V - Baseline SCr 2.0-2.5. Up to 3.52. Possibly related to volume on board. Will attempt diuresis with IV Lasix. May ultimately need dialysis for volume control, however no emergent need at this time. She has established with Dr. Hollie Salk as her primary Nephrologist now. -IV Lasix 80 mg q6h -Check UA -No ACEI/ARB, avoid nephrotoxins  2. Acute CHF exacerbation - Has been on Lasix 80 mg po BID and Metolazone 5 mg daily outpatient. Has sacral and lower extremity edema and crackles on exam. Weight up 10 kg since March. -IV Lasix 80 mg q6h -Continue potassium supplement -Monitor BPs  -Strict I/Os -Daily standing weights  3. Encephalopathy/?Uremia - Confusion last few days, pruritus, isolated elevated Alkaline phosphatase with normal bilirubin. Abdominal U/S shows hx of cholecystectomy and echogenic portal venous walls. No asterixis on exam. -No emergent hemodialysis needed -Check Phosphorous -Lethargy secondary to medication/Atarax?  4. Hypokalemia: Repleted  5. Atrial Fibrillation - Rate controlled. Restarted on Warfarin this admission (held for ?GI bleed prior to this admission)  6. IDDM - Per primary  7. CAD/HTN - On amlodipine, lasix, metolazone, metoprolol, atorvastatin     Vishal Patel  [facility] PGY2 04/22/2016, 3:09 PM   Renal Attending: She has CKD with marked volume overload.  She has pulmonary hypertension and systemic arterial hypertension on meds. We plan diuresis to optimize volume.  If hemodynamics improve and renal blood flow improves will need to maintain a higher dose of diuretics.  If renal function deteriorates with diuresis then dialysis would be next option.  She has marked presacral edema and huge neck veins with  biventricular CHF.  I am not sure if the itching is related to renal disease--  Will check phosphorous.  I would be concerned about biliary related pruritis. Carlito Bogert C

## 2016-04-22 NOTE — Progress Notes (Signed)
Tele called with concerns, Spoke with MD about tele. Pt. Is asymptomatic, no signs of any acute stress. Will continue to monitor.

## 2016-04-23 DIAGNOSIS — N179 Acute kidney failure, unspecified: Secondary | ICD-10-CM

## 2016-04-23 DIAGNOSIS — N189 Chronic kidney disease, unspecified: Secondary | ICD-10-CM

## 2016-04-23 LAB — GLUCOSE, CAPILLARY
GLUCOSE-CAPILLARY: 179 mg/dL — AB (ref 65–99)
Glucose-Capillary: 123 mg/dL — ABNORMAL HIGH (ref 65–99)
Glucose-Capillary: 218 mg/dL — ABNORMAL HIGH (ref 65–99)
Glucose-Capillary: 191 mg/dL — ABNORMAL HIGH (ref 65–99)

## 2016-04-23 LAB — URINE MICROSCOPIC-ADD ON

## 2016-04-23 LAB — CBC
HEMATOCRIT: 31 % — AB (ref 36.0–46.0)
HEMOGLOBIN: 9.6 g/dL — AB (ref 12.0–15.0)
MCH: 28.2 pg (ref 26.0–34.0)
MCHC: 31 g/dL (ref 30.0–36.0)
MCV: 90.9 fL (ref 78.0–100.0)
Platelets: 226 10*3/uL (ref 150–400)
RBC: 3.41 MIL/uL — ABNORMAL LOW (ref 3.87–5.11)
RDW: 18.8 % — ABNORMAL HIGH (ref 11.5–15.5)
WBC: 5.6 10*3/uL (ref 4.0–10.5)

## 2016-04-23 LAB — COMPREHENSIVE METABOLIC PANEL
ALBUMIN: 2.5 g/dL — AB (ref 3.5–5.0)
ALK PHOS: 256 U/L — AB (ref 38–126)
ALT: 15 U/L (ref 14–54)
AST: 27 U/L (ref 15–41)
Anion gap: 15 (ref 5–15)
BILIRUBIN TOTAL: 1.3 mg/dL — AB (ref 0.3–1.2)
BUN: 76 mg/dL — AB (ref 6–20)
CALCIUM: 8.3 mg/dL — AB (ref 8.9–10.3)
CO2: 27 mmol/L (ref 22–32)
Chloride: 93 mmol/L — ABNORMAL LOW (ref 101–111)
Creatinine, Ser: 3.68 mg/dL — ABNORMAL HIGH (ref 0.44–1.00)
GFR calc Af Amer: 13 mL/min — ABNORMAL LOW (ref >60)
GFR calc non Af Amer: 11 mL/min — ABNORMAL LOW (ref >60)
GLUCOSE: 115 mg/dL — AB (ref 65–99)
Potassium: 3.9 mmol/L (ref 3.5–5.1)
Sodium: 135 mmol/L (ref 135–145)
TOTAL PROTEIN: 6.7 g/dL (ref 6.5–8.1)

## 2016-04-23 LAB — URINALYSIS, ROUTINE W REFLEX MICROSCOPIC
Bilirubin Urine: NEGATIVE
Glucose, UA: NEGATIVE mg/dL
Hgb urine dipstick: NEGATIVE
Ketones, ur: NEGATIVE mg/dL
LEUKOCYTES UA: NEGATIVE
NITRITE: NEGATIVE
PH: 7 (ref 5.0–8.0)
Protein, ur: 30 mg/dL — AB
SPECIFIC GRAVITY, URINE: 1.009 (ref 1.005–1.030)

## 2016-04-23 LAB — PHOSPHORUS: Phosphorus: 5 mg/dL — ABNORMAL HIGH (ref 2.5–4.6)

## 2016-04-23 LAB — PROTIME-INR
INR: 1.49
Prothrombin Time: 18.1 seconds — ABNORMAL HIGH (ref 11.4–15.2)

## 2016-04-23 MED ORDER — LACTULOSE 10 GM/15ML PO SOLN
20.0000 g | Freq: Two times a day (BID) | ORAL | Status: DC
  Administered 2016-04-23 – 2016-05-01 (×14): 20 g via ORAL
  Filled 2016-04-23 (×19): qty 30

## 2016-04-23 MED ORDER — AMLODIPINE BESYLATE 5 MG PO TABS: 5.0000 mg | ORAL_TABLET | Freq: Every day | ORAL | Status: DC

## 2016-04-23 MED ORDER — FUROSEMIDE 10 MG/ML IJ SOLN
100.0000 mg | Freq: Three times a day (TID) | INTRAVENOUS | Status: DC
  Administered 2016-04-23 – 2016-04-26 (×8): 100 mg via INTRAVENOUS
  Filled 2016-04-23 (×11): qty 10

## 2016-04-23 MED ORDER — WARFARIN SODIUM 4 MG PO TABS
4.0000 mg | ORAL_TABLET | Freq: Once | ORAL | Status: AC
  Administered 2016-04-23: 4 mg via ORAL
  Filled 2016-04-23: qty 1

## 2016-04-23 NOTE — Progress Notes (Signed)
Hospital Problem List     Principal Problem:   Hypokalemia Active Problems:   Anemia of chronic disease   Chronic atrial fibrillation (HCC)   Type 2 diabetes mellitus with stage 4 chronic kidney disease, with long-term current use of insulin (HCC)   Acute on chronic systolic (congestive) heart failure (HCC)   CKD (chronic kidney disease), stage IV (HCC)   Fluid overload   Acute encephalopathy   Acute on chronic renal failure [facility])     Patient Profile:   Primary Cardiologist: New - Seen in consult by Dr. Bronson Ing but lives in [place].  75 yo F w/ PMH of chronic atrial fibrillation (on Coumadin), chronic systolic heart failure with EF 45%, chronic anemia, CKD IV-V, HTN, HLD, DM, carotid artery stenosis (s/p CEA) and CAD (s/p 4v CABG 1993) who presented to [facility] on 04/20/2016 for pruritis. Cardiology consulted for heart failure.   Subjective   Says her breathing and lower extremity edema has improved. Denies any chest discomfort or palpitations.   Inpatient Medications    . allopurinol  100 mg Oral Daily  . amLODipine  10 mg Oral Daily  . aspirin EC  81 mg Oral Daily  . atorvastatin  40 mg Oral Daily  . enoxaparin (LOVENOX) injection  80 mg Subcutaneous QHS  . furosemide  100 mg Intravenous Q8H  . insulin aspart  0-5 Units Subcutaneous QHS  . insulin aspart  0-9 Units Subcutaneous TID WC  . metolazone  5 mg Oral QODAY  . metoprolol succinate  50 mg Oral Daily  . pantoprazole  40 mg Oral BID  . potassium chloride  40 mEq Oral BID  . sodium chloride flush  3 mL Intravenous Q12H  . warfarin  4 mg Oral ONCE-1800  . Warfarin - Pharmacist Dosing Inpatient   Does not apply q1800    Vital Signs    Vitals:   04/22/16 1354 04/22/16 2132 04/23/16 0344 04/23/16 0529  BP:  110/64  122/73  Pulse:  82  97  Resp:  18  18  Temp: 98.1 F (36.7 C) 98.6 F (37 C)  98.4 F (36.9 C)  TempSrc: Oral   Oral  SpO2:  96%  100%  Weight:   173 lb 4.5 oz (78.6 kg)      Intake/Output Summary (Last 24 hours) at 04/23/16 1324 Last data filed at 04/23/16 1031  Gross per 24 hour  Intake              220 ml  Output              950 ml  Net             -730 ml   Filed Weights   04/21/16 0704 04/22/16 0601 04/23/16 0344  Weight: 171 lb 11.8 oz (77.9 kg) 174 lb 13.2 oz (79.3 kg) 173 lb 4.5 oz (78.6 kg)    Physical Exam    General: Elderly African American female appearing in no acute distress. Head: Normocephalic, atraumatic.  Neck: Supple without bruits, JVD at 9 cm. Lungs:  Resp regular and unlabored, no wheezing or rales on exam. Heart: Irregularly irregular, S1, S2, no S3, S4, or murmur; no rub. Abdomen: Soft, non-tender, non-distended with normoactive bowel sounds. No hepatomegaly. No rebound/guarding. No obvious abdominal masses. Extremities: No clubbing or cyanosis, trace lower extremity edema. Distal pedal pulses are 2+ bilaterally. Neuro: Alert and oriented X 3. Moves all extremities spontaneously. Psych: Normal affect.  Labs    CBC  Recent Labs  04/22/16 0425 04/23/16 0655  WBC 6.0 5.6  HGB 9.8* 9.6*  HCT 31.1* 31.0*  MCV 89.1 90.9  PLT 245 [id number]   Basic Metabolic Panel  Recent Labs  04/20/16 2127  04/22/16 0425 04/22/16 1152 04/23/16 0655  NA  --   < > 134* 134* 135  K  --   < > 3.7 3.9 3.9  CL  --   < > 93* 95* 93*  CO2  --   < > 28 22 27   GLUCOSE  --   < > 144* 193* 115*  BUN  --   < > 75* 75* 76*  CREATININE  --   < > 3.49* 3.52* 3.68*  CALCIUM  --   < > 8.3* 8.4* 8.3*  MG 1.6*  --  1.9  --   --   PHOS  --   --   --   --  5.0*  < > = values in this interval not displayed. Liver Function Tests  Recent Labs  04/22/16 1152 04/23/16 0655  AST 32 27  ALT 17 15  ALKPHOS 269* 256*  BILITOT 1.1 1.3*  PROT 7.2 6.7  ALBUMIN 2.8* 2.5*   No results for input(s): LIPASE, AMYLASE in the last 72 hours. Cardiac Enzymes No results for input(s): CKTOTAL, CKMB, CKMBINDEX, TROPONINI in the last 72 hours. BNP Invalid  input(s): POCBNP D-Dimer No results for input(s): DDIMER in the last 72 hours. Hemoglobin A1C  Recent Labs  04/20/16 2127  HGBA1C 7.7*   Fasting Lipid Panel No results for input(s): CHOL, HDL, LDLCALC, TRIG, CHOLHDL, LDLDIRECT in the last 72 hours. Thyroid Function Tests  Recent Labs  04/20/16 2127  TSH 2.380    Telemetry    Atrial fibrillation, HR in 70's - 90's.   ECG    No new tracings.    Cardiac Studies and Radiology    Dg Chest 2 View  Result Date: 04/21/2016 CLINICAL DATA:  75 year old female with history of acute on chronic systolic heart failure. EXAM: CHEST  2 VIEW COMPARISON:  Chest x-ray 03/22/2016. FINDINGS: Lung volumes are low. There is cephalization of the pulmonary vasculature and slight indistinctness of the interstitial markings suggestive of mild pulmonary edema. Small bilateral pleural effusions. Mild cardiomegaly. Upper mediastinal contours are within normal limits. Aortic atherosclerosis. Status post median sternotomy. Calcified granuloma projecting over the right upper lobe. IMPRESSION: 1. Persistent evidence of congestive heart failure, as above. 2. Aortic atherosclerosis. Electronically Signed   By: Vinnie Langton M.D.   On: 04/21/2016 15:14   US Abdomen Limited  Result Date: 04/22/2016 CLINICAL DATA:  Elevated liver enzymes EXAM: US ABDOMEN LIMITED - RIGHT UPPER QUADRANT COMPARISON:  None. FINDINGS: Gallbladder: Surgically removed Common bile duct: Diameter: 2.3 mm. Liver: No focal mass lesion is noted. Echogenic portal venous walls are seen. This may be related to a component of underlying hepatitis. Bidirectional flow in the portal vein is noted. IMPRESSION: Status post cholecystectomy. Changes suggestive of underlying hepatic inflammatory change. Electronically Signed   By: Inez Catalina M.D.   On: 04/22/2016 16:04    Echocardiogram: 04/21/2016 Study Conclusions  - Left ventricle: The cavity size was normal. Wall thickness was   normal. Systolic  function was moderately to severely reduced. The   estimated ejection fraction was in the range of 30% to 35%.   Diffuse hypokinesis. There is akinesis of the inferolateral and   inferior myocardium. Doppler parameters are consistent with   restrictive physiology, indicative of decreased left  ventricular   diastolic compliance and/or increased left atrial pressure.   Doppler parameters are consistent with high ventricular filling   pressure. - Mitral valve: Calcified annulus. There was moderate   regurgitation. - Left atrium: The atrium was mildly dilated. - Right atrium: The atrium was mildly dilated. - Atrial septum: The septum bowed from right to left, consistent   with increased right atrial pressure. - Tricuspid valve: There was severe regurgitation. - Pulmonary arteries: PA peak pressure: 37 mm Hg (S).  Impressions:  - Global hypokinesis with akinesis of the inferior and inferior   lateral wall; overall moderate to severe LV dysfunction;   restrictive filling; moderate MR; mild biatrial enlargement;   severe TR.  Assessment & Plan    1. Acute on Chronic systolic heart failure - echo at [facility] showed an RVSP of 39mmHg along with biventricular failure, EF 40-45%. Echocardiogram this admission shows normal RVEF, EF 30-35% and current PA peak pressure 37 mmHg which is mildly elevated however not as severe. It is possible that the previous level was obtained during acute heart failure.  - weight at time of admission was lower than recent weight at time of discharge from OSH. Husband reports her weight is around 170 lbs at home. Weight 176 lbs on admission, at 173 lbs today.  - recorded output minimal at -130 mL.Still appears mildly volume overloaded on physical exam, but improved. Appreciate Nephrology assistance with Lasix dosing, as creatinine has continued to trend up.  - continue BB. No ACE-I/ARB secondary to CKD.  2. Chronic atrial fibrillation - This patients CHA2DS2-VASc  Score and unadjusted Ischemic Stroke Rate (% per year) is equal to 9.7 % stroke rate/year from a score of 6 (CHF, HTN, DM, Female, Age, CAD). Continue Coumadin for anticoagulation. - continue BB for rate control.   3. Stage IV-V CKD  - creatinine 3.12 on admission, at 3.68 today. - Nephrology actively following.  4. CAD s/p 4v CABG - no obvious anginal symptoms. EKG without acute ischemic changes - consider outpatient Lexiscan Myoview to assess for ischemia with further reduced EF when compared to previous studies.   5. Hypokalemia - currently resolved, K+ 3.9 on 04/23/2016.  Signed, Erma Heritage , PA-C 1:24 PM 04/23/2016 Pager: [phone]   I have examined the patient and reviewed assessment and plan and discussed with patient.  Agree with above as stated.  Rate controlled.  Following kidney function.  No angina. Coumadin for stroke prevention.   Larae Grooms

## 2016-04-23 NOTE — Progress Notes (Signed)
Physical Therapy Treatment Patient Details Name: Cassandra Bonilla MRN: [id number] DOB: [birth year] Today's Date: 04/23/2016    History of Present Illness Very pleasant 75 yo female with onset of CHF and hypokalemia, on coumadin and home with husband.  Had been to SNF and then home about 2 weeks, now back to hospital.  PMHx:  CHF, CKD, COPD, CAD, DM, gout, a-fib    PT Comments    Progressing slowly.  Not ready for steps today due to lethargy.  Will try 8/9 as able.  Follow Up Recommendations  Home health PT;Supervision for mobility/OOB     Equipment Recommendations  None recommended by PT    Recommendations for Other Services       Precautions / Restrictions Precautions Precautions: Fall    Mobility  Bed Mobility Overal bed mobility: Needs Assistance Bed Mobility: Supine to Sit     Supine to sit: Min assist;Mod assist (min to scoot, mod to come up via R elbow)        Transfers Overall transfer level: Needs assistance Equipment used: Rolling walker (2 wheeled) Transfers: Sit to/from Stand Sit to Stand: Min assist         General transfer comment: cues for sequence and assist for forward and lift.  Ambulation/Gait Ambulation/Gait assistance: Min assist Ambulation Distance (Feet): 10 Feet (x2) Assistive device: Rolling walker (2 wheeled) Gait Pattern/deviations: Step-through pattern;Decreased step length - right;Decreased step length - left;Decreased stride length Gait velocity: very slow Gait velocity interpretation: Below normal speed for age/gender General Gait Details: slow short steps, limited by reports of ?dizziness/light-headedness.   Stairs            Wheelchair Mobility    Modified Rankin (Stroke Patients Only)       Balance   Sitting-balance support: No upper extremity supported Sitting balance-Leahy Scale: Fair       Standing balance-Leahy Scale: Poor                      Cognition Arousal/Alertness: Awake/alert Behavior  During Therapy: WFL for tasks assessed/performed Overall Cognitive Status: Within Functional Limits for tasks assessed                      Exercises      General Comments        Pertinent Vitals/Pain Pain Assessment: Faces Faces Pain Scale: No hurt    Home Living                      Prior Function            PT Goals (current goals can now be found in the care plan section) Acute Rehab PT Goals Patient Stated Goal: to get home safely PT Goal Formulation: With patient/family Time For Goal Achievement: 05/06/16 Potential to Achieve Goals: Good Progress towards PT goals: Progressing toward goals    Frequency  Min 3X/week    PT Plan Current plan remains appropriate    Co-evaluation             End of Session   Activity Tolerance: Patient limited by fatigue;Patient tolerated treatment well Patient left: in chair;with call bell/phone within reach;with family/visitor present;with nursing/sitter in room     Time: [id number] PT Time Calculation (min) (ACUTE ONLY): 28 min  Charges:  $Gait Training: 8-22 mins $Therapeutic Activity: 8-22 mins                    G Codes:  Cassandra Bonilla, Cassandra Bonilla 04/23/2016, 5:31 PM 04/23/2016  Donnella Sham, PT [phone]  (pager)

## 2016-04-23 NOTE — Progress Notes (Signed)
Spoke with MD and charge nurse, foley to be inserted due to acute urinary retention.

## 2016-04-23 NOTE — Progress Notes (Signed)
[facility] Progress Note  S: Patient complaining of significant pruritis this morning. Appears uncomfortable, but denies chest pain, dyspnea, or abdominal pain. She reports that she does not feel she is making more urine than usual. UOP 600 ccs yesterday Wt 174 lbs >> 173 lbs  O: Medications: Infusions:   Scheduled Medications: . allopurinol  100 mg Oral Daily  . amLODipine  10 mg Oral Daily  . aspirin EC  81 mg Oral Daily  . atorvastatin  40 mg Oral Daily  . enoxaparin (LOVENOX) injection  80 mg Subcutaneous QHS  . furosemide  100 mg Intravenous Q8H  . insulin aspart  0-5 Units Subcutaneous QHS  . insulin aspart  0-9 Units Subcutaneous TID WC  . metolazone  5 mg Oral QODAY  . metoprolol succinate  50 mg Oral Daily  . pantoprazole  40 mg Oral BID  . potassium chloride  40 mEq Oral BID  . sodium chloride flush  3 mL Intravenous Q12H  . warfarin  4 mg Oral ONCE-1800  . Warfarin - Pharmacist Dosing Inpatient   Does not apply q1800    Scheduled Meds: . allopurinol  100 mg Oral Daily  . amLODipine  10 mg Oral Daily  . aspirin EC  81 mg Oral Daily  . atorvastatin  40 mg Oral Daily  . enoxaparin (LOVENOX) injection  80 mg Subcutaneous QHS  . furosemide  100 mg Intravenous Q8H  . insulin aspart  0-5 Units Subcutaneous QHS  . insulin aspart  0-9 Units Subcutaneous TID WC  . metolazone  5 mg Oral QODAY  . metoprolol succinate  50 mg Oral Daily  . pantoprazole  40 mg Oral BID  . potassium chloride  40 mEq Oral BID  . sodium chloride flush  3 mL Intravenous Q12H  . warfarin  4 mg Oral ONCE-1800  . Warfarin - Pharmacist Dosing Inpatient   Does not apply q1800   Continuous Infusions:  PRN Meds:.acetaminophen **OR** acetaminophen, hydrOXYzine  BP 122/73 (BP Location: Right Arm)   Pulse 97   Temp 98.4 F (36.9 C) (Oral)   Resp 18   Wt 173 lb 4.5 oz (78.6 kg)   SpO2 100%   BMI 31.69 kg/m    Intake/Output Summary (Last 24 hours) at 04/23/16 1318 Last data filed at  04/23/16 1031  Gross per 24 hour  Intake              220 ml  Output              950 ml  Net             -730 ml    Weight change: 1 lb 8.7 oz (0.7 kg)  EXAM: General: resting in bed, mild distress from pruritus Cardiac: RRR, no rubs, murmurs or gallops Pulm: no rales heard today, moving normal volumes of air Abd: soft, suprapubic tenderness, nondistended, BS present Ext: +1 pitting edema b/l LE with Bauserman appearance, presacral edema Neuro: no asterixis    Labs: Basic Metabolic Panel:  Recent Labs Lab 04/20/16 2127  04/22/16 0425 04/22/16 1152 04/23/16 0655  NA  --   < > 134* 134* 135  K  --   < > 3.7 3.9 3.9  CL  --   < > 93* 95* 93*  CO2  --   < > 28 22 27   GLUCOSE  --   < > 144* 193* 115*  BUN  --   < > 75* 75* 76*  CREATININE  --   < >  3.49* 3.52* 3.68*  CALCIUM  --   < > 8.3* 8.4* 8.3*  MG 1.6*  --  1.9  --   --   PHOS  --   --   --   --  5.0*  < > = values in this interval not displayed.  Liver Function Tests:  Recent Labs Lab 04/21/16 0533 04/22/16 1152 04/23/16 0655  AST 27 32 27  ALT 16 17 15   ALKPHOS 265* 269* 256*  BILITOT 1.3* 1.1 1.3*  PROT 7.1 7.2 6.7  ALBUMIN 2.6* 2.8* 2.5*   No results for input(s): LIPASE, AMYLASE in the last 168 hours.  Recent Labs Lab 04/22/16 1152  AMMONIA 51*    CBC:  Recent Labs Lab 04/20/16 1630 04/21/16 0533 04/22/16 0425 04/23/16 0655  WBC 5.5 6.0 6.0 5.6  HGB 9.9* 9.4* 9.8* 9.6*  HCT 31.4* 29.7* 31.1* 31.0*  MCV 89.5 89.7 89.1 90.9  PLT 252 221 245 226    Cardiac Enzymes: No results for input(s): CKTOTAL, CKMB, CKMBINDEX, TROPONINI in the last 168 hours.  CBG:  Recent Labs Lab 04/22/16 1210 04/22/16 1729 04/22/16 2130 04/23/16 0755 04/23/16 1150  GLUCAP 188* 133* 165* 123* 179*    Iron Studies: No results for input(s): IRON, TIBC, TRANSFERRIN, FERRITIN in the last 72 hours.  ABG    Component Value Date/Time   PHART 7.487 (H) 04/22/2016 1218   PCO2ART 36.6 04/22/2016 1218    PO2ART 80.5 04/22/2016 1218   HCO3 27.4 (H) 04/22/2016 1218   TCO2 28.5 04/22/2016 1218   O2SAT 96.1 04/22/2016 1218      Studies/Results: Dg Chest 2 View  Result Date: 04/21/2016 CLINICAL DATA:  75 year old female with history of acute on chronic systolic heart failure. EXAM: CHEST  2 VIEW COMPARISON:  Chest x-ray 03/22/2016. FINDINGS: Lung volumes are low. There is cephalization of the pulmonary vasculature and slight indistinctness of the interstitial markings suggestive of mild pulmonary edema. Small bilateral pleural effusions. Mild cardiomegaly. Upper mediastinal contours are within normal limits. Aortic atherosclerosis. Status post median sternotomy. Calcified granuloma projecting over the right upper lobe. IMPRESSION: 1. Persistent evidence of congestive heart failure, as above. 2. Aortic atherosclerosis. Electronically Signed   By: Vinnie Langton M.D.   On: 04/21/2016 15:14   US Abdomen Limited  Result Date: 04/22/2016 CLINICAL DATA:  Elevated liver enzymes EXAM: US ABDOMEN LIMITED - RIGHT UPPER QUADRANT COMPARISON:  None. FINDINGS: Gallbladder: Surgically removed Common bile duct: Diameter: 2.3 mm. Liver: No focal mass lesion is noted. Echogenic portal venous walls are seen. This may be related to a component of underlying hepatitis. Bidirectional flow in the portal vein is noted. IMPRESSION: Status post cholecystectomy. Changes suggestive of underlying hepatic inflammatory change. Electronically Signed   By: Inez Catalina M.D.   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 AoCKD IV-V - Baseline SCr 2.0-2.5. Up to 3.52. Possibly related to volume on board. Will attempt diuresis with IV Lasix. May ultimately need dialysis for volume control, however no emergent need at this time. She has  established with Dr. Hollie Salk as her primary Nephrologist now. No significant increase in UOP. -Increased IV Lasix to 100 mg q8h -monitor renal function -Bladder scan -No ACEI/ARB, avoid nephrotoxins  2. Acute CHF exacerbation - Has been on Lasix 80 mg po BID and  Metolazone 5 mg daily outpatient. Has sacral and lower extremity edema and crackles on exam. Weight up 10 kg since March. -IV Lasix to 100 mg q8h -Continue potassium supplement -Monitor BPs  -Strict I/Os -Daily standing weights  3. Encephalopathy/?Uremia - Confusion last few days, pruritus, isolated elevated Alkaline phosphatase with normal bilirubin. Abdominal U/S shows hx of cholecystectomy and echogenic portal venous walls. No asterixis on exam. -No emergent hemodialysis needed -Lethargy secondary to medication/Atarax?  4. Pruritus: Unusual for pruritus to be related to renal disease. Phos slightly up at 5. Alk phos elevated with normal bili. ? Related to biliary disease, s/p cholecystectomy. PBC, PSC workup per primary.   5. Hypokalemia: Repleted  6. Atrial Fibrillation - Rate controlled. Restarted on Warfarin this admission (held for ?GI bleed prior to this admission)  7. IDDM - Per primary  8. CAD/HTN - On amlodipine, lasix, metolazone, metoprolol, atorvastatin  Zada Finders, MD IMTS PGY2  Renal Attending: She remains volume overloaded.  I doubt pruritis related to renal fct but uncertain.  SHe has gen malaise.  She may be uremic.  She is poorly responsive to diuresis and may need HD.  WIll reck CXR. Cheyne Boulden C

## 2016-04-23 NOTE — Progress Notes (Signed)
PROGRESS NOTE    Cassandra Bonilla  ION:[id number] DOB: 1940-10-03 DOA: 04/20/2016 PCP: Marco Collie, MD   Outpatient Specialists: Delsa Grana)- nephrology    Brief Narrative:  Cassandra Bonilla is a 75 y.o. female with a past medical history significant for Afib not on anticoagulation (due to anemia?), chronic systolic CHF EF 24%, anemia, CKD IV-V, and DM who presents with itching but is found to have hypokalemia again. Per history, the patient was ambulatory and able to climb "13 flights of stairs to our apartment" in [place] as recently as a year ago, but in February had a flare of CHF and worsening of her renal function (previously baseline Cr high 1s, subsequently 2-3), and since then has been more debilitated, walking only with a walker.  Two months ago she moved with her husband from [place], and was admitted with hypokalemia here 5 weeks ago.  Husband states this is the 6th hospitalization this calandar year.  Records from previous hospital reviewed and are in [place]. Echocardiogram in May 2017 in [place]: EF 40-45% Severe PH Moderate to severe TR   Assessment & Plan:   Principal Problem:   Hypokalemia Active Problems:   Anemia of chronic disease   Chronic atrial fibrillation (HCC)   Type 2 diabetes mellitus with stage 4 chronic kidney disease, with long-term current use of insulin (HCC)   Acute on chronic systolic (congestive) heart failure (HCC)   CKD (chronic kidney disease), stage IV (HCC)   Fluid overload   Acute encephalopathy   Acute on chronic renal failure (HCC)    Hypokalemia:  60 mEq given in ER. -Repeat potassium still low- replace again -Mg 1.9 -resolved.  -getting supplements.   Acute on Chronic systolic and right sided CHF:   From CKD and right sided and systolic CHF -lasix IV plus PO metolazone -cardiology following  EF 40-45% in May at last echocardiogram in [place].  Pulmonary HTN.   Not on lisinopril because of renal insufficiency.  Echo EF 35 %  -strict  I/Os, daily weights -discontinue Norvasc to avoid hypotension   Acute on  chronic renal insufficiency: CKD IV-V:  No fistula in place. Cr trending up at 3.4. Per records cr range 2.7---2.9 strict I and O.  Appreciate Dr Florene Glen help, Continue with IV lasix. If renal function deteriorate on IV lasix patient might need dialysis.   Encephalopathy, acute;  More alert.  ABG no hypercapnia , Ammonia level mildly elevated at 55.   Nephrology consulted, evaluation for uremia.   Transaminases; Pruritus:  [name] cholecystectomy and underline hepatic inflammatory changes.  Ammonia at 50-- start lactulose.  Alk phosphatase elevated, pruritus. Will check for primary biliary cirrhosis: ANA, AMA.  Check hepatitis panel viral.  Denies abdominal pain.   Atrial fibrillation:  CHADS2-VASc 5.  Was on warfarin but this was stopped at some point because of her anemia.  There was never any bleeding event (melena or hematochezia), just that she was anemic.  -Lovenox for Afib dosing -Warfarin dosed per pharmacy -Continue metoprolol  IDDM:  -Hold home glargine -Sliding scale corrections, renal dose  Coronary artery disease and HTN: History of CABG, PCI 2, CEA. -Continue amlodipine, furosemide, metolazone, metorpolol, and statin      DVT prophylaxis:  Lovenox to coumadin  Code Status: Full Code   Family Communication: Husband at bedside  Disposition Plan:   Remain inpatient.   Consultants:   Cardiology  Nephrology   Procedures:        Subjective: She is more alert, mild confusion  per husband still persist.  Relates itching   Objective: Vitals:   04/23/16 0344 04/23/16 0529 04/23/16 1350 04/23/16 1401  BP:  122/73 110/72   Pulse:  97 (!) 107   Resp:  18 16   Temp:  98.4 F (36.9 C) 97.8 F (36.6 C)   TempSrc:  Oral Oral   SpO2:  100% 93%   Weight: 78.6 kg (173 lb 4.5 oz)     Height:    5' 2"  (1.575 m)    Intake/Output Summary (Last 24 hours) at 04/23/16  1500 Last data filed at 04/23/16 1031  Gross per 24 hour  Intake              220 ml  Output              950 ml  Net             -730 ml   Filed Weights   04/21/16 0704 04/22/16 0601 04/23/16 0344  Weight: 77.9 kg (171 lb 11.8 oz) 79.3 kg (174 lb 13.2 oz) 78.6 kg (173 lb 4.5 oz)    Examination:  General exam: more alert today  Respiratory system: Crackles, no wheezing Cardiovascular system: S1 & S2 heard, RRR. No JVD, murmurs, rubs, gallops or clicks- edema to thighs Gastrointestinal system: Abdomen is nondistended, soft and nontender. No organomegaly or masses felt. Normal bowel sounds heard. Extremities: moves all 4 ext     Data Reviewed: I have personally reviewed following labs and imaging studies  CBC:  Recent Labs Lab 04/20/16 1630 04/21/16 0533 04/22/16 0425 04/23/16 0655  WBC 5.5 6.0 6.0 5.6  HGB 9.9* 9.4* 9.8* 9.6*  HCT 31.4* 29.7* 31.1* 31.0*  MCV 89.5 89.7 89.1 90.9  PLT 252 221 245 119   Basic Metabolic Panel:  Recent Labs Lab 04/20/16 1630 04/20/16 2127 04/21/16 0533 04/22/16 0425 04/22/16 1152 04/23/16 0655  NA 134*  --  134* 134* 134* 135  K 2.4*  --  2.8* 3.7 3.9 3.9  CL 91*  --  91* 93* 95* 93*  CO2 27  --  29 28 22 27   GLUCOSE 179*  --  242* 144* 193* 115*  BUN 70*  --  73* 75* 75* 76*  CREATININE 3.12*  --  3.25* 3.49* 3.52* 3.68*  CALCIUM 8.3*  --  8.0* 8.3* 8.4* 8.3*  MG  --  1.6*  --  1.9  --   --   PHOS  --   --   --   --   --  5.0*   GFR: Estimated Creatinine Clearance: 13 mL/min (by C-G formula based on SCr of 3.68 mg/dL). Liver Function Tests:  Recent Labs Lab 04/20/16 1630 04/21/16 0533 04/22/16 1152 04/23/16 0655  AST 27 27 32 27  ALT 15 16 17 15   ALKPHOS 258* 265* 269* 256*  BILITOT 1.4* 1.3* 1.1 1.3*  PROT 7.2 7.1 7.2 6.7  ALBUMIN 2.7* 2.6* 2.8* 2.5*   No results for input(s): LIPASE, AMYLASE in the last 168 hours.  Recent Labs Lab 04/22/16 1152  AMMONIA 51*   Coagulation Profile:  Recent Labs Lab  04/20/16 2127 04/21/16 0533 04/22/16 0425 04/23/16 0655  INR 1.32 1.33 1.33 1.49   Cardiac Enzymes: No results for input(s): CKTOTAL, CKMB, CKMBINDEX, TROPONINI in the last 168 hours. BNP (last 3 results) No results for input(s): PROBNP in the last 8760 hours. HbA1C:  Recent Labs  04/20/16 2127  HGBA1C 7.7*   CBG:  Recent Labs Lab 04/22/16  1210 04/22/16 1729 04/22/16 2130 04/23/16 0755 04/23/16 1150  GLUCAP 188* 133* 165* 123* 179*   Lipid Profile: No results for input(s): CHOL, HDL, LDLCALC, TRIG, CHOLHDL, LDLDIRECT in the last 72 hours. Thyroid Function Tests:  Recent Labs  04/20/16 2127  TSH 2.380   Anemia Panel: No results for input(s): VITAMINB12, FOLATE, FERRITIN, TIBC, IRON, RETICCTPCT in the last 72 hours. Urine analysis:    Component Value Date/Time   COLORURINE YELLOW 04/23/2016 0015   APPEARANCEUR CLOUDY (A) 04/23/2016 0015   LABSPEC 1.009 04/23/2016 0015   PHURINE 7.0 04/23/2016 0015   GLUCOSEU NEGATIVE 04/23/2016 0015   HGBUR NEGATIVE 04/23/2016 0015   BILIRUBINUR NEGATIVE 04/23/2016 0015   KETONESUR NEGATIVE 04/23/2016 0015   PROTEINUR 30 (A) 04/23/2016 0015   NITRITE NEGATIVE 04/23/2016 0015   LEUKOCYTESUR NEGATIVE 04/23/2016 0015     )No results found for this or any previous visit (from the past 240 hour(s)).    Anti-infectives    None       Radiology Studies: US Abdomen Limited  Result Date: 04/22/2016 CLINICAL DATA:  Elevated liver enzymes EXAM: US ABDOMEN LIMITED - RIGHT UPPER QUADRANT COMPARISON:  None. FINDINGS: Gallbladder: Surgically removed Common bile duct: Diameter: 2.3 mm. Liver: No focal mass lesion is noted. Echogenic portal venous walls are seen. This may be related to a component of underlying hepatitis. Bidirectional flow in the portal vein is noted. IMPRESSION: Status post cholecystectomy. Changes suggestive of underlying hepatic inflammatory change. Electronically Signed   By: Inez Catalina M.D.   On: 04/22/2016  16:04        Scheduled Meds: . allopurinol  100 mg Oral Daily  . [START ON 04/24/2016] amLODipine  5 mg Oral Daily  . aspirin EC  81 mg Oral Daily  . atorvastatin  40 mg Oral Daily  . enoxaparin (LOVENOX) injection  80 mg Subcutaneous QHS  . furosemide  100 mg Intravenous Q8H  . insulin aspart  0-5 Units Subcutaneous QHS  . insulin aspart  0-9 Units Subcutaneous TID WC  . metolazone  5 mg Oral QODAY  . metoprolol succinate  50 mg Oral Daily  . pantoprazole  40 mg Oral BID  . potassium chloride  40 mEq Oral BID  . sodium chloride flush  3 mL Intravenous Q12H  . warfarin  4 mg Oral ONCE-1800  . Warfarin - Pharmacist Dosing Inpatient   Does not apply q1800   Continuous Infusions:    LOS: 1 day    Time spent: 25 min    Elmarie Shiley, MD [facility] Pager [phone]  If 7PM-7AM, please contact night-coverage [email or web address] Password [email or web address] 04/23/2016, 3:00 PM

## 2016-04-23 NOTE — Progress Notes (Addendum)
ANTICOAGULATION CONSULT NOTE - Follow Up Consult  Pharmacy Consult for Lovenox and warfarin Indication: atrial fibrillation  Allergies  Allergen Reactions  . Contrast Media  [Iodinated Diagnostic Agents] Hives  . Prednisone Other (See Comments)    Confusion    Patient Measurements: Weight: 173 lb 4.5 oz (78.6 kg)  Vital Signs: Temp: 98.4 F (36.9 C) (08/08 0529) Temp Source: Oral (08/08 0529) BP: 122/73 (08/08 0529) Pulse Rate: 97 (08/08 0529)  Labs:  Recent Labs  04/21/16 0533 04/22/16 0425 04/22/16 1152 04/23/16 0655  HGB 9.4* 9.8*  --  9.6*  HCT 29.7* 31.1*  --  31.0*  PLT 221 245  --  226  LABPROT 16.6* 16.6*  --  18.1*  INR 1.33 1.33  --  1.49  CREATININE 3.25* 3.49* 3.52* 3.68*    Estimated Creatinine Clearance: 13 mL/min (by C-G formula based on SCr of 3.68 mg/dL).   Assessment: 75 y/o female admitted 04/20/2016 with pruritis. She was on warfarin for hx Afib up until May when this was stopped due to concern for internal bleeding per husband. Warfarin resumed this admit and Lovenox added until INR >2. Pharmacy managing inpatient.  INR is subtherapeutic at 1.49 today. No bleeding noted, Hgb is low at 9.6 but stable, platelets are normal. SCr is trending upwards.  Goal of Therapy:  INR 2-3 Anti-Xa level 0.6-1 units/ml 4hrs after LMWH dose given Monitor platelets by anticoagulation protocol: Yes   Plan:  - Continue Lovenox 80 mg SQ qhs - Repeat warfarin 4 mg PO tonight -Consider checking Anti-Xa level 4 hour post dose due to risk of accumulation - Daily INR, CBC q72h while on Lovenox - Monitor for s/sx of bleeding  Katie Comanici PharmD Candidate  I discussed / reviewed the pharmacy note by K. Comanici and I agree with the resident's findings and plans as documented. Eudelia Bunch, Pharm.D. [id number] 04/23/2016 12:03 PM

## 2016-04-24 ENCOUNTER — Inpatient Hospital Stay (HOSPITAL_COMMUNITY): Payer: Medicare Other

## 2016-04-24 DIAGNOSIS — I5023 Acute on chronic systolic (congestive) heart failure: Secondary | ICD-10-CM

## 2016-04-24 LAB — CBC
HEMATOCRIT: 32.7 % — AB (ref 36.0–46.0)
HEMOGLOBIN: 10.2 g/dL — AB (ref 12.0–15.0)
MCH: 28.1 pg (ref 26.0–34.0)
MCHC: 31.2 g/dL (ref 30.0–36.0)
MCV: 90.1 fL (ref 78.0–100.0)
Platelets: 251 10*3/uL (ref 150–400)
RBC: 3.63 MIL/uL — ABNORMAL LOW (ref 3.87–5.11)
RDW: 18.6 % — AB (ref 11.5–15.5)
WBC: 5.3 10*3/uL (ref 4.0–10.5)

## 2016-04-24 LAB — HEPATIC FUNCTION PANEL
ALBUMIN: 2.7 g/dL — AB (ref 3.5–5.0)
ALK PHOS: 280 U/L — AB (ref 38–126)
ALT: 16 U/L (ref 14–54)
AST: 29 U/L (ref 15–41)
BILIRUBIN INDIRECT: 0.8 mg/dL (ref 0.3–0.9)
BILIRUBIN TOTAL: 1.2 mg/dL (ref 0.3–1.2)
Bilirubin, Direct: 0.4 mg/dL (ref 0.1–0.5)
TOTAL PROTEIN: 7.4 g/dL (ref 6.5–8.1)

## 2016-04-24 LAB — RENAL FUNCTION PANEL
ALBUMIN: 2.7 g/dL — AB (ref 3.5–5.0)
Anion gap: 17 — ABNORMAL HIGH (ref 5–15)
BUN: 81 mg/dL — AB (ref 6–20)
CALCIUM: 8.4 mg/dL — AB (ref 8.9–10.3)
CO2: 22 mmol/L (ref 22–32)
CREATININE: 3.83 mg/dL — AB (ref 0.44–1.00)
Chloride: 96 mmol/L — ABNORMAL LOW (ref 101–111)
GFR calc Af Amer: 12 mL/min — ABNORMAL LOW (ref >60)
GFR calc non Af Amer: 11 mL/min — ABNORMAL LOW (ref >60)
GLUCOSE: 182 mg/dL — AB (ref 65–99)
PHOSPHORUS: 5.3 mg/dL — AB (ref 2.5–4.6)
Potassium: 4.8 mmol/L (ref 3.5–5.1)
SODIUM: 135 mmol/L (ref 135–145)

## 2016-04-24 LAB — HEPATITIS PANEL, ACUTE
HCV Ab: <0.1 s/co ratio (ref 0.0–0.9)
Hep A IgM: NEGATIVE
Hep B C IgM: NEGATIVE
Hepatitis B Surface Ag: NEGATIVE

## 2016-04-24 LAB — PROTIME-INR
INR: 1.75
Prothrombin Time: 20.7 seconds — ABNORMAL HIGH (ref 11.4–15.2)

## 2016-04-24 LAB — AMMONIA: Ammonia: 47 umol/L — ABNORMAL HIGH (ref 9–35)

## 2016-04-24 LAB — MITOCHONDRIAL ANTIBODIES: Mitochondrial M2 Ab, IgG: 12.2 Units (ref 0.0–20.0)

## 2016-04-24 LAB — GLUCOSE, CAPILLARY
GLUCOSE-CAPILLARY: 181 mg/dL — AB (ref 65–99)
Glucose-Capillary: 191 mg/dL — ABNORMAL HIGH (ref 65–99)
Glucose-Capillary: 198 mg/dL — ABNORMAL HIGH (ref 65–99)
Glucose-Capillary: 207 mg/dL — ABNORMAL HIGH (ref 65–99)

## 2016-04-24 LAB — BRAIN NATRIURETIC PEPTIDE: B Natriuretic Peptide: 3497.9 pg/mL — ABNORMAL HIGH (ref 0.0–100.0)

## 2016-04-24 MED ORDER — COUMADIN BOOK
Freq: Once | Status: AC
  Administered 2016-04-24: 18:00:00
  Filled 2016-04-24: qty 1

## 2016-04-24 MED ORDER — WARFARIN SODIUM 4 MG PO TABS
4.0000 mg | ORAL_TABLET | Freq: Once | ORAL | Status: AC
  Administered 2016-04-24: 4 mg via ORAL
  Filled 2016-04-24: qty 1

## 2016-04-24 NOTE — Progress Notes (Signed)
Patient Name: Cassandra Bonilla Date of Encounter: 04/24/2016     Principal Problem:   Hypokalemia Active Problems:   Anemia of chronic disease   Chronic atrial fibrillation (HCC)   Type 2 diabetes mellitus with stage 4 chronic kidney disease, with long-term current use of insulin (HCC)   Acute on chronic systolic (congestive) heart failure (HCC)   CKD (chronic kidney disease), stage IV (HCC)   Fluid overload   Acute encephalopathy   Acute on chronic renal failure ([place])    SUBJECTIVE  Denies any CP or SOB. Has only been ambulating to the bathroom.   CURRENT MEDS . allopurinol  100 mg Oral Daily  . aspirin EC  81 mg Oral Daily  . atorvastatin  40 mg Oral Daily  . enoxaparin (LOVENOX) injection  80 mg Subcutaneous QHS  . furosemide  100 mg Intravenous Q8H  . insulin aspart  0-5 Units Subcutaneous QHS  . insulin aspart  0-9 Units Subcutaneous TID WC  . lactulose  20 g Oral BID  . metolazone  5 mg Oral QODAY  . metoprolol succinate  50 mg Oral Daily  . pantoprazole  40 mg Oral BID  . potassium chloride  40 mEq Oral BID  . sodium chloride flush  3 mL Intravenous Q12H  . warfarin  4 mg Oral ONCE-1800  . Warfarin - Pharmacist Dosing Inpatient   Does not apply q1800    OBJECTIVE  Vitals:   04/23/16 1350 04/23/16 1401 04/23/16 2110 04/24/16 0617  BP: 110/72  126/64 (!) 118/48  Pulse: (!) 107  69 61  Resp: 16  16 16   Temp: 97.8 F (36.6 C)  97.6 F (36.4 C) 97.7 F (36.5 C)  TempSrc: Oral  Oral Oral  SpO2: 93%  100% 99%  Weight:    173 lb 15.1 oz (78.9 kg)  Height:  5\' 2"  (1.575 m)      Intake/Output Summary (Last 24 hours) at 04/24/16 1018 Last data filed at 04/24/16 0949  Gross per 24 hour  Intake              360 ml  Output              350 ml  Net               10 ml   Filed Weights   04/22/16 0601 04/23/16 0344 04/24/16 0617  Weight: 174 lb 13.2 oz (79.3 kg) 173 lb 4.5 oz (78.6 kg) 173 lb 15.1 oz (78.9 kg)    PHYSICAL EXAM  General: Pleasant,  NAD. Neuro: Alert and oriented X 3. Moves all extremities spontaneously. Psych: Normal affect. HEENT:  Normal  Neck: Supple without bruits or JVD. Lungs:  Resp regular and unlabored, CTA. Heart: irregularly irregular. no s3, s4. 3/6 systolic murmur Abdomen: Soft, non-tender, non-distended, BS + x 4.  Extremities: No clubbing, cyanosis. DP/PT/Radials 2+ and equal bilaterally. 1+ pitting edema   Accessory Clinical Findings  CBC  Recent Labs  04/23/16 0655 04/24/16 0516  WBC 5.6 5.3  HGB 9.6* 10.2*  HCT 31.0* 32.7*  MCV 90.9 90.1  PLT 226 [id number]   Basic Metabolic Panel  Recent Labs  04/22/16 0425  04/23/16 0655 04/24/16 0516  NA 134*  < > 135 135  K 3.7  < > 3.9 4.8  CL 93*  < > 93* 96*  CO2 28  < > 27 22  GLUCOSE 144*  < > 115* 182*  BUN 75*  < > 76* 81*  CREATININE 3.49*  < > 3.68* 3.83*  CALCIUM 8.3*  < > 8.3* 8.4*  MG 1.9  --   --   --   PHOS  --   --  5.0* 5.3*  < > = values in this interval not displayed. Liver Function Tests  Recent Labs  04/23/16 0655 04/24/16 0516  AST 27 29  ALT 15 16  ALKPHOS 256* 280*  BILITOT 1.3* 1.2  PROT 6.7 7.4  ALBUMIN 2.5* 2.7*  2.7*    TELE Afib, rate controlled    ECG  No new EKG  Echocardiogram 04/21/2016  Left ventricle: The cavity size was normal. Wall thickness was   normal. Systolic function was moderately to severely reduced. The   estimated ejection fraction was in the range of 30% to 35%.   Diffuse hypokinesis. There is akinesis of the inferolateral and   inferior myocardium. Doppler parameters are consistent with   restrictive physiology, indicative of decreased left ventricular   diastolic compliance and/or increased left atrial pressure.   Doppler parameters are consistent with high ventricular filling   pressure. - Mitral valve: Calcified annulus. There was moderate   regurgitation. - Left atrium: The atrium was mildly dilated. - Right atrium: The atrium was mildly dilated. - Atrial septum: The  septum bowed from right to left, consistent   with increased right atrial pressure. - Tricuspid valve: There was severe regurgitation. - Pulmonary arteries: PA peak pressure: 37 mm Hg (S).  Impressions:  - Global hypokinesis with akinesis of the inferior and inferior   lateral wall; overall moderate to severe LV dysfunction;   restrictive filling; moderate MR; mild biatrial enlargement;   severe TR.    Radiology/Studies  Dg Chest 2 View  Result Date: 04/21/2016 CLINICAL DATA:  75 year old female with history of acute on chronic systolic heart failure. EXAM: CHEST  2 VIEW COMPARISON:  Chest x-ray 03/22/2016. FINDINGS: Lung volumes are low. There is cephalization of the pulmonary vasculature and slight indistinctness of the interstitial markings suggestive of mild pulmonary edema. Small bilateral pleural effusions. Mild cardiomegaly. Upper mediastinal contours are within normal limits. Aortic atherosclerosis. Status post median sternotomy. Calcified granuloma projecting over the right upper lobe. IMPRESSION: 1. Persistent evidence of congestive heart failure, as above. 2. Aortic atherosclerosis. Electronically Signed   By: Vinnie Langton M.D.   On: 04/21/2016 15:14   US Abdomen Limited  Result Date: 04/22/2016 CLINICAL DATA:  Elevated liver enzymes EXAM: US ABDOMEN LIMITED - RIGHT UPPER QUADRANT COMPARISON:  None. FINDINGS: Gallbladder: Surgically removed Common bile duct: Diameter: 2.3 mm. Liver: No focal mass lesion is noted. Echogenic portal venous walls are seen. This may be related to a component of underlying hepatitis. Bidirectional flow in the portal vein is noted. IMPRESSION: Status post cholecystectomy. Changes suggestive of underlying hepatic inflammatory change. Electronically Signed   By: Inez Catalina M.D.   On: 04/22/2016 16:04    ASSESSMENT AND PLAN  1. Acute on Chronic systolic heart failure - echo at [facility] showed an RVSP of 57mmHg along with biventricular failure, EF  40-45%. Echocardiogram this admission shows normal RVEF, EF 30-35% and current PA peak pressure 37 mmHg which is mildly elevated however not as severe. It is possible that the previous level was obtained during acute heart failure.  - weight at time of admission was lower than recent weight at time of discharge from OSH. Husband reports her weight is around 170 lbs at home. Weight 176 lbs on admission. Current weight 173 - urine output minimal.  On 100mg  IV lasix gtt q8Hr. Her lung is clear, minimal edema in bilateral LE which may always present given severe tricuspid regurg. I think she is close to euvolemic level. Lasix managed by nephrology - continue BB. No ACE-I/ARB secondary to CKD. Consider addition of BiDil  2. Chronic atrial fibrillation - This patients CHA2DS2-VASc Score and unadjusted Ischemic Stroke Rate (% per year) is equal to 9.7 % stroke rate/year from a score of 6 (CHF, HTN, DM, Female, Age, CAD). Continue Coumadin for anticoagulation. - continue BB for rate control.   3. Stage IV-V CKD  - creatinine 3.12 on admission Cr 3.68 --> Cr 3.83 - Nephrology actively following.  4. CAD s/p 4v CABG - no obvious anginal symptoms. EKG without acute ischemic changes - consider outpatient Lexiscan Myoview to assess for ischemia with further reduced EF when compared to previous studies.   5. Hypokalemia - currently resolved, K+ 3.9 on 04/23/2016.  Signed, Almyra Deforest PA-C Pager: [phone]   I have examined the patient and reviewed assessment and plan and discussed with patient.  Agree with above as stated.  AFib, rate controlled.  Cr increasing.  Mild leg edema. No angina.  Larae Grooms

## 2016-04-24 NOTE — Progress Notes (Signed)
[facility] Progress Note  S: No pruritis this morning. Denies chest pain, dyspnea, or abdominal pain. Feels her legs have fluid on them. UOP 350 ccs recorded yesterday, husband reports foley bag was nearly full last night and UOP not recorded prior to removal Wt 173 lbs, unchanged Foley placed yesterday  O: Medications: Infusions:   Scheduled Medications: . allopurinol  100 mg Oral Daily  . aspirin EC  81 mg Oral Daily  . atorvastatin  40 mg Oral Daily  . enoxaparin (LOVENOX) injection  80 mg Subcutaneous QHS  . furosemide  100 mg Intravenous Q8H  . insulin aspart  0-5 Units Subcutaneous QHS  . insulin aspart  0-9 Units Subcutaneous TID WC  . lactulose  20 g Oral BID  . metolazone  5 mg Oral QODAY  . metoprolol succinate  50 mg Oral Daily  . pantoprazole  40 mg Oral BID  . potassium chloride  40 mEq Oral BID  . sodium chloride flush  3 mL Intravenous Q12H  . Warfarin - Pharmacist Dosing Inpatient   Does not apply q1800    Scheduled Meds: . allopurinol  100 mg Oral Daily  . aspirin EC  81 mg Oral Daily  . atorvastatin  40 mg Oral Daily  . enoxaparin (LOVENOX) injection  80 mg Subcutaneous QHS  . furosemide  100 mg Intravenous Q8H  . insulin aspart  0-5 Units Subcutaneous QHS  . insulin aspart  0-9 Units Subcutaneous TID WC  . lactulose  20 g Oral BID  . metolazone  5 mg Oral QODAY  . metoprolol succinate  50 mg Oral Daily  . pantoprazole  40 mg Oral BID  . potassium chloride  40 mEq Oral BID  . sodium chloride flush  3 mL Intravenous Q12H  . Warfarin - Pharmacist Dosing Inpatient   Does not apply q1800   Continuous Infusions:  PRN Meds:.acetaminophen **OR** acetaminophen, hydrOXYzine  BP (!) 118/48 (BP Location: Left Arm)   Pulse 61   Temp 97.7 F (36.5 C) (Oral)   Resp 16   Ht 5' 2"  (1.575 m)   Wt 173 lb 15.1 oz (78.9 kg)   SpO2 99%   BMI 31.81 kg/m    Intake/Output Summary (Last 24 hours) at 04/24/16 0834 Last data filed at 04/23/16 1031  Gross per  24 hour  Intake                0 ml  Output              350 ml  Net             -350 ml    Weight change: 10.6 oz (0.3 kg)  EXAM: General: resting in bed, no acute distress Cardiac: irr irr, no rubs, murmurs or gallops Pulm: no rales heard today, moving normal volumes of air Abd: soft, non tender, nondistended, BS present GU: foley Ext: +1 pitting edema b/l LE with Shock appearance, presacral edema Neuro: no asterixis    Labs: Basic Metabolic Panel:  Recent Labs Lab 04/20/16 2127  04/22/16 0425 04/22/16 1152 04/23/16 0655 04/24/16 0516  NA  --   < > 134* 134* 135 135  K  --   < > 3.7 3.9 3.9 4.8  CL  --   < > 93* 95* 93* 96*  CO2  --   < > 28 22 27 22   GLUCOSE  --   < > 144* 193* 115* 182*  BUN  --   < >  75* 75* 76* 81*  CREATININE  --   < > 3.49* 3.52* 3.68* 3.83*  CALCIUM  --   < > 8.3* 8.4* 8.3* 8.4*  MG 1.6*  --  1.9  --   --   --   PHOS  --   --   --   --  5.0* 5.3*  < > = values in this interval not displayed.  Liver Function Tests:  Recent Labs Lab 04/22/16 1152 04/23/16 0655 04/24/16 0516  AST 32 27 29  ALT 17 15 16   ALKPHOS 269* 256* 280*  BILITOT 1.1 1.3* 1.2  PROT 7.2 6.7 7.4  ALBUMIN 2.8* 2.5* 2.7*  2.7*   No results for input(s): LIPASE, AMYLASE in the last 168 hours.  Recent Labs Lab 04/22/16 1152  AMMONIA 51*    CBC:  Recent Labs Lab 04/20/16 1630 04/21/16 0533 04/22/16 0425 04/23/16 0655 04/24/16 0516  WBC 5.5 6.0 6.0 5.6 5.3  HGB 9.9* 9.4* 9.8* 9.6* 10.2*  HCT 31.4* 29.7* 31.1* 31.0* 32.7*  MCV 89.5 89.7 89.1 90.9 90.1  PLT 252 221 245 226 251    Cardiac Enzymes: No results for input(s): CKTOTAL, CKMB, CKMBINDEX, TROPONINI in the last 168 hours.  CBG:  Recent Labs Lab 04/23/16 0755 04/23/16 1150 04/23/16 1715 04/23/16 2112 04/24/16 0804  GLUCAP 123* 179* 218* 191* 191*    Iron Studies: No results for input(s): IRON, TIBC, TRANSFERRIN, FERRITIN in the last 72 hours.  ABG    Component Value Date/Time    PHART 7.487 (H) 04/22/2016 1218   PCO2ART 36.6 04/22/2016 1218   PO2ART 80.5 04/22/2016 1218   HCO3 27.4 (H) 04/22/2016 1218   TCO2 28.5 04/22/2016 1218   O2SAT 96.1 04/22/2016 1218      Studies/Results: US Abdomen Limited  Result Date: 04/22/2016 CLINICAL DATA:  Elevated liver enzymes EXAM: US ABDOMEN LIMITED - RIGHT UPPER QUADRANT COMPARISON:  None. FINDINGS: Gallbladder: Surgically removed Common bile duct: Diameter: 2.3 mm. Liver: No focal mass lesion is noted. Echogenic portal venous walls are seen. This may be related to a component of underlying hepatitis. Bidirectional flow in the portal vein is noted. IMPRESSION: Status post cholecystectomy. Changes suggestive of underlying hepatic inflammatory change. Electronically Signed   By: Inez Catalina M.D.   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 AoCKD IV-V - Baseline SCr 2.0-2.5. Up to 3.52. Possibly related to volume on board. Will attempt diuresis with IV Lasix. May ultimately need dialysis for volume control, however no emergent need at this time. She has established with Dr. Hollie Salk as her primary Nephrologist now. No significant increase in UOP. -IV Lasix 100 mg q8h -monitor renal function -Continue foley for urinary retention -No ACEI/ARB, avoid nephrotoxins -Obtain Renal U/S  2. Acute CHF exacerbation - Has been on Lasix 80 mg po BID and Metolazone 5 mg daily outpatient. Has presacral and lower extremity edema on exam. Weight up 10 kg on admission since March. -IV Lasix to 100 mg q8h -Continue potassium supplement -Monitor BPs  -Strict I/Os -Daily standing weights  3. Encephalopathy/?Uremia - Confusion last few days, pruritus, isolated elevated Alkaline phosphatase with normal bilirubin. Abdominal U/S shows hx of  cholecystectomy and echogenic portal venous walls. No asterixis on exam. -No emergent hemodialysis needed -Lethargy secondary to medication/Atarax?  4. Pruritus: Unusual  for pruritus to be related to renal disease. Phos slightly up at 5. Alk phos elevated with normal bili. ? Related to biliary disease, s/p cholecystectomy. PBC, PSC workup per primary. AMA, ANA pending.  5. Hypokalemia: Repleted  6. Atrial Fibrillation - Rate controlled. Restarted on Warfarin this admission (held for ?GI bleed prior to this admission)  7. IDDM - Per primary  8. CAD/HTN - On lasix, metolazone, metoprolol, atorvastatin. Amlodipine d/c'ed.  Zada Finders, MD IMTS PGY2  Renal Attending: Pt appears to be responding to high dose diuretics although record keeping not accurate.  She remains overloaded. Will continue to diurese. Rexine Gowens C

## 2016-04-24 NOTE — Progress Notes (Signed)
ANTICOAGULATION CONSULT NOTE - Follow Up Consult  Pharmacy Consult for Lovenox and warfarin Indication: atrial fibrillation  Allergies  Allergen Reactions  . Contrast Media  [Iodinated Diagnostic Agents] Hives  . Prednisone Other (See Comments)    Confusion    Patient Measurements: Height: 5\' 2"  (157.5 cm) Weight: 173 lb 15.1 oz (78.9 kg) IBW/kg (Calculated) : 50.1  Vital Signs: Temp: 97.7 F (36.5 C) (08/09 0617) Temp Source: Oral (08/09 0617) BP: 118/48 (08/09 0617) Pulse Rate: 61 (08/09 0617)  Labs:  Recent Labs  04/22/16 0425 04/22/16 1152 04/23/16 0655 04/24/16 0516  HGB 9.8*  --  9.6* 10.2*  HCT 31.1*  --  31.0* 32.7*  PLT 245  --  226 251  LABPROT 16.6*  --  18.1* 20.7*  INR 1.33  --  1.49 1.75  CREATININE 3.49* 3.52* 3.68* 3.83*    Estimated Creatinine Clearance: 12.5 mL/min (by C-G formula based on SCr of 3.83 mg/dL).   Assessment: 75 y/o female admitted 04/20/2016 with pruritis. She was on warfarin for hx Afib up until May when this was stopped due to concern for internal bleeding per husband. Warfarin resumed this admit and Lovenox added until INR >2. Pharmacy managing inpatient.  INR is subtherapeutic at 1.75 today. No bleeding noted, Hgb is low at 10.2 but stable, platelets are normal. SCr is trending upwards.  Goal of Therapy:  INR 2-3 Anti-Xa level 0.6-1 units/ml 4hrs after LMWH dose given Monitor platelets by anticoagulation protocol: Yes   Plan:  - Continue Lovenox 80 mg SQ qhs - Repeat warfarin 4 mg PO tonight - Daily INR, CBC q72h while on Lovenox - Monitor for s/sx of bleeding  Justice Deeds PharmD Candidate

## 2016-04-24 NOTE — Care Management Important Message (Signed)
Important Message  Patient Details  Name: Cassandra Bonilla MRN: [id number] Date of Birth: [birth year]   Medicare Important Message Given:  Yes    Loann Quill 04/24/2016, 3:05 PM

## 2016-04-24 NOTE — Progress Notes (Signed)
PROGRESS NOTE    Cassandra Bonilla  RDE:[id number] DOB: 28-Feb-1941 DOA: 04/20/2016 PCP: Marco Collie, MD   Outpatient Specialists: Delsa Grana)- nephrology    Brief Narrative:  Cassandra Bonilla is a 75 y.o. female with a past medical history significant for Afib not on anticoagulation (due to anemia?), chronic systolic CHF EF 63%, anemia, CKD IV-V, and DM who presents with itching but is found to have hypokalemia again. Per history, the patient was ambulatory and able to climb "13 flights of stairs to our apartment" in [place] as recently as a year ago, but in February had a flare of CHF and worsening of her renal function (previously baseline Cr high 1s, subsequently 2-3), and since then has been more debilitated, walking only with a walker.  Two months ago she moved with her husband from [place], and was admitted with hypokalemia here 5 weeks ago.  Husband states this is the 6th hospitalization this calandar year.  Records from previous hospital reviewed and are in [place]. Echocardiogram in May 2017 in [place]: EF 40-45% Severe PH Moderate to severe TR   Assessment & Plan:   Principal Problem:   Hypokalemia Active Problems:   Anemia of chronic disease   Chronic atrial fibrillation (HCC)   Type 2 diabetes mellitus with stage 4 chronic kidney disease, with long-term current use of insulin (HCC)   Acute on chronic systolic (congestive) heart failure (HCC)   CKD (chronic kidney disease), stage IV (HCC)   Fluid overload   Acute encephalopathy   Acute on chronic renal failure (HCC)    Acute on Chronic systolic and right sided CHF:   From CKD and right sided and systolic CHF EF 14-97% in May at last echocardiogram in [place].  Pulmonary HTN.   Not on lisinopril because of renal insufficiency.  Echo EF 35 % by echo during this admission -strict I/Os, daily weights -discontinue Norvasc to avoid hypotension  -lasix IV plus PO metolazone -cardiology following   Acute on  chronic renal  insufficiency: CKD IV-V:   Per records cr range 2.7---2.9 strict I and O.  Appreciate nephrology Dr Florene Glen help, Continue with IV lasix. If renal function deteriorate on IV lasix patient might need dialysis.   Encephalopathy, acute;  More alert.  ABG no hypercapnia , Ammonia level mildly elevated at 55.   Nephrology consulted, evaluation for uremia.   Transaminases; Pruritus:  [name] cholecystectomy and underline hepatic inflammatory changes.  Ammonia at 50-- start lactulose.  Alk phosphatase elevated, pruritus. Will check for primary biliary cirrhosis: ANA, AMA.  Check hepatitis panel viral.  Denies abdominal pain.   Atrial fibrillation:  CHADS2-VASc 5.  Was on warfarin but this was stopped at some point because of her anemia.  There was never any bleeding event (melena or hematochezia), just that she was anemic.  -Lovenox for Afib dosing -Warfarin dosed per pharmacy -Continue metoprolol  Hypokalemia:  replaced -Mg 1.9   IDDM:  -Hold home glargine -Sliding scale corrections, renal dose  Coronary artery disease and HTN: History of CABG, PCI 2, CEA. -Continue amlodipine, furosemide, metolazone, metorpolol, and statin      DVT prophylaxis:  Lovenox to coumadin  Code Status: Full Code   Family Communication: Husband at bedside  Disposition Plan:   Remain inpatient.   Consultants:   Cardiology  Nephrology   Procedures:   none  Subjective: Feeling much better, no itching today, report lower extremity edema  Objective: Vitals:   04/23/16 1401 04/23/16 2110 04/24/16 0617 04/24/16 1358  BP:  126/64 (!) 118/48 107/63  Pulse:  69 61 65  Resp:  16 16   Temp:  97.6 F (36.4 C) 97.7 F (36.5 C) 98.1 F (36.7 C)  TempSrc:  Oral Oral Oral  SpO2:  100% 99% 100%  Weight:   78.9 kg (173 lb 15.1 oz)   Height: _0  (1.575 m)       Intake/Output Summary (Last 24 hours) at 04/24/16 1848 Last data filed at 04/24/16 1350  Gross per 24 hour  Intake               720 ml  Output              450 ml  Net              270 ml   Filed Weights   04/22/16 0601 04/23/16 0344 04/24/16 0617  Weight: 79.3 kg (174 lb 13.2 oz) 78.6 kg (173 lb 4.5 oz) 78.9 kg (173 lb 15.1 oz)    Examination:  General exam: more alert today  Respiratory system: Crackles, no wheezing Cardiovascular system: S1 & S2 heard, RRR. No JVD, murmurs, rubs, gallops or clicks- edema to thighs Gastrointestinal system: Abdomen is nondistended, soft and nontender. No organomegaly or masses felt. Normal bowel sounds heard. Extremities: moves all 4 ext     Data Reviewed: I have personally reviewed following labs and imaging studies  CBC:  Recent Labs Lab 04/20/16 1630 04/21/16 0533 04/22/16 0425 04/23/16 0655 04/24/16 0516  WBC 5.5 6.0 6.0 5.6 5.3  HGB 9.9* 9.4* 9.8* 9.6* 10.2*  HCT 31.4* 29.7* 31.1* 31.0* 32.7*  MCV 89.5 89.7 89.1 90.9 90.1  PLT 252 221 245 226 542   Basic Metabolic Panel:  Recent Labs Lab 04/20/16 2127 04/21/16 0533 04/22/16 0425 04/22/16 1152 04/23/16 0655 04/24/16 0516  NA  --  134* 134* 134* 135 135  K  --  2.8* 3.7 3.9 3.9 4.8  CL  --  91* 93* 95* 93* 96*  CO2  --  _1 GLUCOSE  --  242* 144* 193* 115* 182*  BUN  --  73* 75* 75* 76* 81*  CREATININE  --  3.25* 3.49* 3.52* 3.68* 3.83*  CALCIUM  --  8.0* 8.3* 8.4* 8.3* 8.4*  MG 1.6*  --  1.9  --   --   --   PHOS  --   --   --   --  5.0* 5.3*   GFR: Estimated Creatinine Clearance: 12.5 mL/min (by C-G formula based on SCr of 3.83 mg/dL). Liver Function Tests:  Recent Labs Lab 04/20/16 1630 04/21/16 0533 04/22/16 1152 04/23/16 0655 04/24/16 0516  AST 27 27 32 27 29  ALT _2 ALKPHOS 258* 265* 269* 256* 280*  BILITOT 1.4* 1.3* 1.1 1.3* 1.2  PROT 7.2 7.1 7.2 6.7 7.4  ALBUMIN 2.7* 2.6* 2.8* 2.5* 2.7*  2.7*   No results for input(s): LIPASE, AMYLASE in the last 168 hours.  Recent Labs Lab 04/22/16 1152 04/24/16 1642  AMMONIA 51* 47*   Coagulation  Profile:  Recent Labs Lab 04/20/16 2127 04/21/16 0533 04/22/16 0425 04/23/16 0655 04/24/16 0516  INR 1.32 1.33 1.33 1.49 1.75   Cardiac Enzymes: No results for input(s): CKTOTAL, CKMB, CKMBINDEX, TROPONINI in the last 168 hours. BNP (last 3 results) No results for input(s): PROBNP in the last 8760 hours. HbA1C: No results for input(s): HGBA1C in the last 72 hours. CBG:  Recent Labs Lab 04/23/16  1715 04/23/16 2112 04/24/16 0804 04/24/16 1203 04/24/16 1713  GLUCAP 218* 191* 191* 198* 207*   Lipid Profile: No results for input(s): CHOL, HDL, LDLCALC, TRIG, CHOLHDL, LDLDIRECT in the last 72 hours. Thyroid Function Tests: No results for input(s): TSH, T4TOTAL, FREET4, T3FREE, THYROIDAB in the last 72 hours. Anemia Panel: No results for input(s): VITAMINB12, FOLATE, FERRITIN, TIBC, IRON, RETICCTPCT in the last 72 hours. Urine analysis:    Component Value Date/Time   COLORURINE YELLOW 04/23/2016 0015   APPEARANCEUR CLOUDY (A) 04/23/2016 0015   LABSPEC 1.009 04/23/2016 0015   PHURINE 7.0 04/23/2016 0015   GLUCOSEU NEGATIVE 04/23/2016 0015   HGBUR NEGATIVE 04/23/2016 0015   BILIRUBINUR NEGATIVE 04/23/2016 0015   KETONESUR NEGATIVE 04/23/2016 0015   PROTEINUR 30 (A) 04/23/2016 0015   NITRITE NEGATIVE 04/23/2016 0015   LEUKOCYTESUR NEGATIVE 04/23/2016 0015     )No results found for this or any previous visit (from the past 240 hour(s)).    Anti-infectives    None       Radiology Studies: US Renal  Result Date: 04/24/2016 CLINICAL DATA:  Stage 4 chronic kidney disease. EXAM: RENAL / URINARY TRACT ULTRASOUND COMPLETE COMPARISON:  April 22, 2016 complete abdomen ultrasound FINDINGS: Right Kidney: Length: 10.5 cm. There is diffuse increased echotexture of the right kidney. No mass or hydronephrosis visualized. Left Kidney: Length: 10.6 cm. There is diffuse increased echotexture of the left kidney. No mass or hydronephrosis visualized. Bladder: Not well seen with Foley  catheter in place. There is incidental finding of right pleural effusion and ascites. IMPRESSION: No hydronephrosis identified bilaterally. Bilateral diffuse increased echotexture of the kidneys consistent with medical renal disease. Right pleural effusion and ascites. Electronically Signed   By: Abelardo Diesel M.D.   On: 04/24/2016 13:07        Scheduled Meds: . allopurinol  100 mg Oral Daily  . aspirin EC  81 mg Oral Daily  . atorvastatin  40 mg Oral Daily  . enoxaparin (LOVENOX) injection  80 mg Subcutaneous QHS  . furosemide  100 mg Intravenous Q8H  . insulin aspart  0-5 Units Subcutaneous QHS  . insulin aspart  0-9 Units Subcutaneous TID WC  . lactulose  20 g Oral BID  . metolazone  5 mg Oral QODAY  . metoprolol succinate  50 mg Oral Daily  . pantoprazole  40 mg Oral BID  . potassium chloride  40 mEq Oral BID  . sodium chloride flush  3 mL Intravenous Q12H  . Warfarin - Pharmacist Dosing Inpatient   Does not apply q1800   Continuous Infusions:    LOS: 2 days    Time spent: 25 min    Woodard Perrell, MD PhD [facility] Pager [phone]  If 7PM-7AM, please contact night-coverage [email or web address] Password [facility] 04/24/2016, 6:48 PM

## 2016-04-25 ENCOUNTER — Encounter (HOSPITAL_COMMUNITY): Payer: Medicare Other

## 2016-04-25 ENCOUNTER — Inpatient Hospital Stay (HOSPITAL_COMMUNITY): Payer: Medicare Other

## 2016-04-25 DIAGNOSIS — E8770 Fluid overload, unspecified: Secondary | ICD-10-CM

## 2016-04-25 LAB — BASIC METABOLIC PANEL
Anion gap: 14 (ref 5–15)
BUN: 81 mg/dL — AB (ref 6–20)
CALCIUM: 8.1 mg/dL — AB (ref 8.9–10.3)
CO2: 25 mmol/L (ref 22–32)
CREATININE: 3.86 mg/dL — AB (ref 0.44–1.00)
Chloride: 96 mmol/L — ABNORMAL LOW (ref 101–111)
GFR calc non Af Amer: 11 mL/min — ABNORMAL LOW (ref >60)
GFR, EST AFRICAN AMERICAN: 12 mL/min — AB (ref >60)
GLUCOSE: 201 mg/dL — AB (ref 65–99)
Potassium: 4.7 mmol/L (ref 3.5–5.1)
Sodium: 135 mmol/L (ref 135–145)

## 2016-04-25 LAB — CBC
HEMATOCRIT: 30.7 % — AB (ref 36.0–46.0)
Hemoglobin: 9.8 g/dL — ABNORMAL LOW (ref 12.0–15.0)
MCH: 28 pg (ref 26.0–34.0)
MCHC: 31.9 g/dL (ref 30.0–36.0)
MCV: 87.7 fL (ref 78.0–100.0)
Platelets: 188 10*3/uL (ref 150–400)
RBC: 3.5 MIL/uL — ABNORMAL LOW (ref 3.87–5.11)
RDW: 18.5 % — AB (ref 11.5–15.5)
WBC: 5 10*3/uL (ref 4.0–10.5)

## 2016-04-25 LAB — ANTINUCLEAR ANTIBODIES, IFA: ANA Ab, IFA: POSITIVE — AB

## 2016-04-25 LAB — GLUCOSE, CAPILLARY
GLUCOSE-CAPILLARY: 214 mg/dL — AB (ref 65–99)
GLUCOSE-CAPILLARY: 214 mg/dL — AB (ref 65–99)
GLUCOSE-CAPILLARY: 215 mg/dL — AB (ref 65–99)
Glucose-Capillary: 189 mg/dL — ABNORMAL HIGH (ref 65–99)

## 2016-04-25 LAB — AMMONIA: Ammonia: 48 umol/L — ABNORMAL HIGH (ref 9–35)

## 2016-04-25 LAB — PROTIME-INR
INR: 2.04
Prothrombin Time: 23.4 seconds — ABNORMAL HIGH (ref 11.4–15.2)

## 2016-04-25 LAB — FANA STAINING PATTERNS: Homogeneous Pattern: 1:80 {titer}

## 2016-04-25 LAB — BRAIN NATRIURETIC PEPTIDE: B Natriuretic Peptide: 3143.2 pg/mL — ABNORMAL HIGH (ref 0.0–100.0)

## 2016-04-25 MED ORDER — INSULIN GLARGINE 100 UNIT/ML ~~LOC~~ SOLN
5.0000 [IU] | Freq: Every day | SUBCUTANEOUS | Status: DC
Start: 2016-04-26 — End: 2016-05-03
  Administered 2016-04-26 – 2016-05-03 (×7): 5 [IU] via SUBCUTANEOUS
  Filled 2016-04-25 (×8): qty 0.05

## 2016-04-25 MED ORDER — GUAIFENESIN ER 600 MG PO TB12
600.0000 mg | ORAL_TABLET | Freq: Two times a day (BID) | ORAL | Status: DC
  Administered 2016-04-26 – 2016-05-03 (×14): 600 mg via ORAL
  Filled 2016-04-25 (×15): qty 1

## 2016-04-25 MED ORDER — DIPHENHYDRAMINE HCL 50 MG/ML IJ SOLN
12.5000 mg | Freq: Three times a day (TID) | INTRAMUSCULAR | Status: DC | PRN
  Administered 2016-04-26 – 2016-05-02 (×10): 12.5 mg via INTRAVENOUS
  Filled 2016-04-25 (×11): qty 1

## 2016-04-25 MED ORDER — WARFARIN SODIUM 4 MG PO TABS
4.0000 mg | ORAL_TABLET | Freq: Once | ORAL | Status: AC
  Administered 2016-04-25: 4 mg via ORAL
  Filled 2016-04-25: qty 1

## 2016-04-25 NOTE — Progress Notes (Signed)
PROGRESS NOTE    Cassandra Bonilla  WIO:[id number] DOB: 1940-09-20 DOA: 04/20/2016 PCP: Marco Collie, MD   Outpatient Specialists: Delsa Grana)- nephrology    Brief Narrative:  Cassandra Bonilla is a 75 y.o. female with a past medical history significant for Afib not on anticoagulation (due to anemia?), chronic systolic CHF EF 42%, anemia, CKD IV-V, and DM who presents with itching but is found to have hypokalemia again. Per history, the patient was ambulatory and able to climb "13 flights of stairs to our apartment" in [place] as recently as a year ago, but in February had a flare of CHF and worsening of her renal function (previously baseline Cr high 1s, subsequently 2-3), and since then has been more debilitated, walking only with a walker.  Two months ago she moved with her husband from [place], and was admitted with hypokalemia here 5 weeks ago.  Husband states this is the 6th hospitalization this calandar year.  Records from previous hospital reviewed and are in [place]. Echocardiogram in May 2017 in [place]: EF 40-45% Severe PH Moderate to severe TR   Assessment & Plan:   Principal Problem:   Hypokalemia Active Problems:   Anemia of chronic disease   Chronic atrial fibrillation (HCC)   Type 2 diabetes mellitus with stage 4 chronic kidney disease, with long-term current use of insulin (HCC)   Acute on chronic systolic (congestive) heart failure (HCC)   CKD (chronic kidney disease), stage IV (HCC)   Fluid overload   Acute encephalopathy   Acute on chronic renal failure (HCC)    Acute on Chronic systolic and right sided CHF:   From CKD and right sided and systolic CHF EF 68-34% in May at last echocardiogram in [place].  Pulmonary HTN.   Not on lisinopril because of renal insufficiency.  Echo EF 35 % by echo during this admission -strict I/Os, daily weights -discontinue Norvasc to avoid hypotension  -lasix IV plus PO metolazone -cardiology following   Acute on  chronic renal  insufficiency: CKD IV-V:   Per records cr range 2.7---2.9 Appreciate nephrology Dr Florene Glen help, Continue with IV lasix. If renal function deteriorate on IV lasix patient might need dialysis.   Encephalopathy, acute;  ABG no hypercapnia , Ammonia level mildly elevated at 55.  On lactulose Nephrology consulted, evaluation for uremia.  Improving,   Transaminases; Pruritus:  [name] cholecystectomy and underline hepatic inflammatory changes.  Ammonia at 50-- start lactulose.  Alk phosphatase elevated, pruritus. Ana mildly elevated, mitochondrial antibodies negative.   viral hepatitis panel negative Denies abdominal pain.   Atrial fibrillation:  CHADS2-VASc 5.  Was on warfarin but this was stopped at some point because of her anemia.  There was never any bleeding event (melena or hematochezia), just that she was anemic.  --Warfarin with lovenox bridging dosed per pharmacy -Continue metoprolol  Hypokalemia:  replaced -Mg 1.9   IDDM:  - home glargine held initially, restarted on 8/11 5units qham -Sliding scale corrections, renal dose  Coronary artery disease and HTN: History of CABG, PCI 2, CEA. -Continue asa, amlodipine, furosemide, metolazone, metorpolol, and statin      DVT prophylaxis:  Lovenox to coumadin  Code Status: Full Code   Family Communication: Husband at bedside  Disposition Plan:   Remain inpatient.   Consultants:   Cardiology  Nephrology   Procedures:   none  Subjective: Urine output 1.1 liter last 24hrs, persistent lower extremity edema, know she is in the hospital, but not able to remember the name, not oriented to  time  Feeling much better, had one brief episode of intense itching this morning,      Objective: Vitals:   04/24/16 1358 04/24/16 2136 04/25/16 0645 04/25/16 0645  BP: 107/63 111/61  128/60  Pulse: 65 83  73  Resp:  17  17  Temp: 98.1 F (36.7 C) 98.5 F (36.9 C)  97.8 F (36.6 C)  TempSrc: Oral Oral  Oral  SpO2:  100% 100%  100%  Weight:   80.1 kg (176 lb 9.4 oz)   Height:        Intake/Output Summary (Last 24 hours) at 04/25/16 0828 Last data filed at 04/25/16 0645  Gross per 24 hour  Intake             1500 ml  Output             1100 ml  Net              400 ml   Filed Weights   04/23/16 0344 04/24/16 0617 04/25/16 0645  Weight: 78.6 kg (173 lb 4.5 oz) 78.9 kg (173 lb 15.1 oz) 80.1 kg (176 lb 9.4 oz)    Examination:  General exam: alert and pleasant, still with confusion  Respiratory system: Crackles, no wheezing Cardiovascular system: S1 & S2 heard, RRR. No JVD, murmurs, rubs, gallops or clicks- edema to thighs Gastrointestinal system: Abdomen is nondistended, soft and nontender. No organomegaly or masses felt. Normal bowel sounds heard. Extremities: moves all 4 ext     Data Reviewed: I have personally reviewed following labs and imaging studies  CBC:  Recent Labs Lab 04/21/16 0533 04/22/16 0425 04/23/16 0655 04/24/16 0516 04/25/16 0431  WBC 6.0 6.0 5.6 5.3 5.0  HGB 9.4* 9.8* 9.6* 10.2* 9.8*  HCT 29.7* 31.1* 31.0* 32.7* 30.7*  MCV 89.7 89.1 90.9 90.1 87.7  PLT 221 245 226 251 676   Basic Metabolic Panel:  Recent Labs Lab 04/20/16 2127 04/21/16 0533 04/22/16 0425 04/22/16 1152 04/23/16 0655 04/24/16 0516  NA  --  134* 134* 134* 135 135  K  --  2.8* 3.7 3.9 3.9 4.8  CL  --  91* 93* 95* 93* 96*  CO2  --  29 28 22 27 22   GLUCOSE  --  242* 144* 193* 115* 182*  BUN  --  73* 75* 75* 76* 81*  CREATININE  --  3.25* 3.49* 3.52* 3.68* 3.83*  CALCIUM  --  8.0* 8.3* 8.4* 8.3* 8.4*  MG 1.6*  --  1.9  --   --   --   PHOS  --   --   --   --  5.0* 5.3*   GFR: Estimated Creatinine Clearance: 12.6 mL/min (by C-G formula based on SCr of 3.83 mg/dL). Liver Function Tests:  Recent Labs Lab 04/20/16 1630 04/21/16 0533 04/22/16 1152 04/23/16 0655 04/24/16 0516  AST 27 27 32 27 29  ALT 15 16 17 15 16   ALKPHOS 258* 265* 269* 256* 280*  BILITOT 1.4* 1.3* 1.1 1.3* 1.2    PROT 7.2 7.1 7.2 6.7 7.4  ALBUMIN 2.7* 2.6* 2.8* 2.5* 2.7*  2.7*   No results for input(s): LIPASE, AMYLASE in the last 168 hours.  Recent Labs Lab 04/22/16 1152 04/24/16 1642  AMMONIA 51* 47*   Coagulation Profile:  Recent Labs Lab 04/21/16 0533 04/22/16 0425 04/23/16 0655 04/24/16 0516 04/25/16 0431  INR 1.33 1.33 1.49 1.75 2.04   Cardiac Enzymes: No results for input(s): CKTOTAL, CKMB, CKMBINDEX, TROPONINI in the last 168  hours. BNP (last 3 results) No results for input(s): PROBNP in the last 8760 hours. HbA1C: No results for input(s): HGBA1C in the last 72 hours. CBG:  Recent Labs Lab 04/24/16 0804 04/24/16 1203 04/24/16 1713 04/24/16 2135 04/25/16 0801  GLUCAP 191* 198* 207* 181* 189*   Lipid Profile: No results for input(s): CHOL, HDL, LDLCALC, TRIG, CHOLHDL, LDLDIRECT in the last 72 hours. Thyroid Function Tests: No results for input(s): TSH, T4TOTAL, FREET4, T3FREE, THYROIDAB in the last 72 hours. Anemia Panel: No results for input(s): VITAMINB12, FOLATE, FERRITIN, TIBC, IRON, RETICCTPCT in the last 72 hours. Urine analysis:    Component Value Date/Time   COLORURINE YELLOW 04/23/2016 0015   APPEARANCEUR CLOUDY (A) 04/23/2016 0015   LABSPEC 1.009 04/23/2016 0015   PHURINE 7.0 04/23/2016 0015   GLUCOSEU NEGATIVE 04/23/2016 0015   HGBUR NEGATIVE 04/23/2016 0015   BILIRUBINUR NEGATIVE 04/23/2016 0015   KETONESUR NEGATIVE 04/23/2016 0015   PROTEINUR 30 (A) 04/23/2016 0015   NITRITE NEGATIVE 04/23/2016 0015   LEUKOCYTESUR NEGATIVE 04/23/2016 0015     )No results found for this or any previous visit (from the past 240 hour(s)).    Anti-infectives    None       Radiology Studies: US Renal  Result Date: 04/24/2016 CLINICAL DATA:  Stage 4 chronic kidney disease. EXAM: RENAL / URINARY TRACT ULTRASOUND COMPLETE COMPARISON:  April 22, 2016 complete abdomen ultrasound FINDINGS: Right Kidney: Length: 10.5 cm. There is diffuse increased echotexture  of the right kidney. No mass or hydronephrosis visualized. Left Kidney: Length: 10.6 cm. There is diffuse increased echotexture of the left kidney. No mass or hydronephrosis visualized. Bladder: Not well seen with Foley catheter in place. There is incidental finding of right pleural effusion and ascites. IMPRESSION: No hydronephrosis identified bilaterally. Bilateral diffuse increased echotexture of the kidneys consistent with medical renal disease. Right pleural effusion and ascites. Electronically Signed   By: Abelardo Diesel M.D.   On: 04/24/2016 13:07        Scheduled Meds: . allopurinol  100 mg Oral Daily  . aspirin EC  81 mg Oral Daily  . atorvastatin  40 mg Oral Daily  . enoxaparin (LOVENOX) injection  80 mg Subcutaneous QHS  . furosemide  100 mg Intravenous Q8H  . insulin aspart  0-5 Units Subcutaneous QHS  . insulin aspart  0-9 Units Subcutaneous TID WC  . lactulose  20 g Oral BID  . metolazone  5 mg Oral QODAY  . metoprolol succinate  50 mg Oral Daily  . pantoprazole  40 mg Oral BID  . potassium chloride  40 mEq Oral BID  . sodium chloride flush  3 mL Intravenous Q12H  . Warfarin - Pharmacist Dosing Inpatient   Does not apply q1800   Continuous Infusions:    LOS: 3 days    Time spent: 25 min    Michole Lecuyer, MD PhD [facility] Pager [phone]  If 7PM-7AM, please contact night-coverage [email or web address] Password [email or web address] 04/25/2016, 8:28 AM

## 2016-04-25 NOTE — Progress Notes (Signed)
Inpatient Diabetes Program Recommendations  AACE/ADA: New Consensus Statement on Inpatient Glycemic Control (2015)  Target Ranges:  Prepandial:   less than 140 mg/dL      Peak postprandial:   less than 180 mg/dL (1-2 hours)      Critically ill patients:  140 - 180 mg/dL   Results for Cassandra Bonilla, Cassandra Bonilla (MRN [id number]) as of 04/25/2016 14:03  Ref. Range 04/24/2016 08:04 04/24/2016 12:03 04/24/2016 17:13 04/24/2016 21:35 04/25/2016 08:01 04/25/2016 12:12  Glucose-Capillary Latest Ref Range: 65 - 99 mg/dL 191 (H) 198 (H) 207 (H) 181 (H) 189 (H) 214 (H)   Review of Glycemic Control  Diabetes history: DM2 Outpatient Diabetes medications: Lantus 5 units QAM, Humalog 8 units at bedtime (if CBG < 400 mg/dl; or 10 units if > 400 mg/dl) Current orders for Inpatient glycemic control: Novolog 0-9 units TID with meals, Novolog 0-5 units QHS  Inpatient Diabetes Program Recommendations:  Insulin - Basal: Glucose has ranged from 181-214 mg/dl over the past 28 hours and patient has received a total of Novolog 12 units for correction over the past 28 hours. Please consider ordering Lantus 5 units QAM.  Thanks, Barnie Alderman, RN, MSN, CDE Diabetes Coordinator Inpatient Diabetes Program [phone] (Team Pager from [place] to [place]) [phone] ([facility] office) [phone] [facility] office) [phone] [facility] office)

## 2016-04-25 NOTE — Progress Notes (Signed)
[facility] Progress Note  S: She feels well this morning, near her baseline, laying flat without dyspnea. No pruritis this morning. UOP 1100 ccs recorded yesterday, ~200 ccs in foley bag this morning She is able to tell me her name and that she is at [facility], but not the year.  O: Medications: Infusions:   Scheduled Medications: . allopurinol  100 mg Oral Daily  . aspirin EC  81 mg Oral Daily  . atorvastatin  40 mg Oral Daily  . enoxaparin (LOVENOX) injection  80 mg Subcutaneous QHS  . furosemide  100 mg Intravenous Q8H  . insulin aspart  0-5 Units Subcutaneous QHS  . insulin aspart  0-9 Units Subcutaneous TID WC  . lactulose  20 g Oral BID  . metolazone  5 mg Oral QODAY  . metoprolol succinate  50 mg Oral Daily  . pantoprazole  40 mg Oral BID  . potassium chloride  40 mEq Oral BID  . sodium chloride flush  3 mL Intravenous Q12H  . Warfarin - Pharmacist Dosing Inpatient   Does not apply q1800    Scheduled Meds: . allopurinol  100 mg Oral Daily  . aspirin EC  81 mg Oral Daily  . atorvastatin  40 mg Oral Daily  . enoxaparin (LOVENOX) injection  80 mg Subcutaneous QHS  . furosemide  100 mg Intravenous Q8H  . insulin aspart  0-5 Units Subcutaneous QHS  . insulin aspart  0-9 Units Subcutaneous TID WC  . lactulose  20 g Oral BID  . metolazone  5 mg Oral QODAY  . metoprolol succinate  50 mg Oral Daily  . pantoprazole  40 mg Oral BID  . potassium chloride  40 mEq Oral BID  . sodium chloride flush  3 mL Intravenous Q12H  . Warfarin - Pharmacist Dosing Inpatient   Does not apply q1800   Continuous Infusions:  PRN Meds:.acetaminophen **OR** acetaminophen, hydrOXYzine  BP 128/60 (BP Location: Right Arm)   Pulse 73   Temp 97.8 F (36.6 C) (Oral)   Resp 17   Ht 5' 2"  (1.575 m)   Wt 176 lb 9.4 oz (80.1 kg)   SpO2 100%   BMI 32.30 kg/m    Intake/Output Summary (Last 24 hours) at 04/25/16 0849 Last data filed at 04/25/16 0645  Gross per 24 hour  Intake              1500 ml  Output             1100 ml  Net              400 ml    Weight change: 2 lb 10.3 oz (1.2 kg)  EXAM: General: resting in bed, no acute distress Cardiac: irr irr, no rubs, murmurs or gallops Pulm: no crackles heard, moving normal volumes of air Abd: soft, non tender, nondistended, BS present GU: foley Ext: trace pitting edema b/l LE with Crable appearance, presacral edema -> slight improvement this morning Neuro: no asterixis, alert and oriented to person and place    Labs: Basic Metabolic Panel:  Recent Labs Lab 04/20/16 2127  04/22/16 0425  04/23/16 0655 04/24/16 0516 04/25/16 0741  NA  --   < > 134*  < > 135 135 135  K  --   < > 3.7  < > 3.9 4.8 4.7  CL  --   < > 93*  < > 93* 96* 96*  CO2  --   < > 28  < >  27 22 25   GLUCOSE  --   < > 144*  < > 115* 182* 201*  BUN  --   < > 75*  < > 76* 81* 81*  CREATININE  --   < > 3.49*  < > 3.68* 3.83* 3.86*  CALCIUM  --   < > 8.3*  < > 8.3* 8.4* 8.1*  MG 1.6*  --  1.9  --   --   --   --   PHOS  --   --   --   --  5.0* 5.3*  --   < > = values in this interval not displayed.  Liver Function Tests:  Recent Labs Lab 04/22/16 1152 04/23/16 0655 04/24/16 0516  AST 32 27 29  ALT 17 15 16   ALKPHOS 269* 256* 280*  BILITOT 1.1 1.3* 1.2  PROT 7.2 6.7 7.4  ALBUMIN 2.8* 2.5* 2.7*  2.7*   No results for input(s): LIPASE, AMYLASE in the last 168 hours.  Recent Labs Lab 04/22/16 1152 04/24/16 1642 04/25/16 0741  AMMONIA 51* 47* 48*    CBC:  Recent Labs Lab 04/21/16 0533 04/22/16 0425 04/23/16 0655 04/24/16 0516 04/25/16 0431  WBC 6.0 6.0 5.6 5.3 5.0  HGB 9.4* 9.8* 9.6* 10.2* 9.8*  HCT 29.7* 31.1* 31.0* 32.7* 30.7*  MCV 89.7 89.1 90.9 90.1 87.7  PLT 221 245 226 251 188    Cardiac Enzymes: No results for input(s): CKTOTAL, CKMB, CKMBINDEX, TROPONINI in the last 168 hours.  CBG:  Recent Labs Lab 04/24/16 0804 04/24/16 1203 04/24/16 1713 04/24/16 2135 04/25/16 0801  GLUCAP 191* 198* 207*  181* 189*    Iron Studies: No results for input(s): IRON, TIBC, TRANSFERRIN, FERRITIN in the last 72 hours.  ABG    Component Value Date/Time   PHART 7.487 (H) 04/22/2016 1218   PCO2ART 36.6 04/22/2016 1218   PO2ART 80.5 04/22/2016 1218   HCO3 27.4 (H) 04/22/2016 1218   TCO2 28.5 04/22/2016 1218   O2SAT 96.1 04/22/2016 1218      Studies/Results: US Renal  Result Date: 04/24/2016 CLINICAL DATA:  Stage 4 chronic kidney disease. EXAM: RENAL / URINARY TRACT ULTRASOUND COMPLETE COMPARISON:  April 22, 2016 complete abdomen ultrasound FINDINGS: Right Kidney: Length: 10.5 cm. There is diffuse increased echotexture of the right kidney. No mass or hydronephrosis visualized. Left Kidney: Length: 10.6 cm. There is diffuse increased echotexture of the left kidney. No mass or hydronephrosis visualized. Bladder: Not well seen with Foley catheter in place. There is incidental finding of right pleural effusion and ascites. IMPRESSION: No hydronephrosis identified bilaterally. Bilateral diffuse increased echotexture of the kidneys consistent with medical renal disease. Right pleural effusion and ascites. Electronically Signed   By: Abelardo Diesel M.D.   On: 04/24/2016 13:07   Assessment/Plan: 75 year old female with PMH of CHF (EF 30-35% TTE 04/21/16), CKD Stage IV-V (baseline SCr 2.0-2.5), Atrial Fibrillation, CAD, HTN, IDDM, and Gout who was admitted with acute CHF exacerbation and pruritus  1. AoCKD IV-V - Baseline SCr 2.0-2.5. Up to 3.52. Possibly related to volume on board. Will attempt diuresis with IV Lasix. May ultimately need dialysis for volume control, however no emergent need at this time. She has established with Dr. Hollie Salk as her primary Nephrologist now. Appears to have good response to IV lasix. -SCr stable at 3.86 from 3.83 yesterday -IV Lasix 100 mg q8h -monitor renal function -Continue foley for urinary retention -No ACEI/ARB, avoid nephrotoxins -Renal U/S without hydronephrosis, shows  incidental finding of right  pleural effusion and ascites -Will obtain vein mapping to evaluate for dialysis access in the future, no emergent need for dialysis now  2. Acute CHF exacerbation - Has been on Lasix 80 mg po BID and Metolazone 5 mg daily outpatient. Has presacral and lower extremity edema on exam. Weight up 10 kg on admission since March. -IV Lasix to 100 mg q8h -Continue potassium supplement -Monitor BPs with diuresis -Strict I/Os -Daily standing weights  3. Encephalopathy/?Uremia - Confusion last few days, pruritus, isolated elevated Alkaline phosphatase with normal bilirubin. Abdominal U/S shows hx of cholecystectomy and echogenic portal venous walls. No asterixis on exam. -No emergent hemodialysis needed -Lethargy secondary to medication/Atarax?  4. Pruritus: Unusual for pruritus to be related to renal disease. Phos slightly up at 5. Alk phos elevated with normal bili. ? Related to biliary disease, s/p cholecystectomy. PBC, PSC workup per primary. AMA, ANA pending.  5. Hypokalemia: Repleted  6. Atrial Fibrillation - Rate controlled. Restarted on Warfarin this admission (held for ?GI bleed prior to this admission)  7. IDDM - Per primary  8. CAD/HTN - On lasix, metolazone, metoprolol, atorvastatin. Amlodipine d/c'ed to avoid hypotension.  Zada Finders, MD IMTS PGY2  Renal Attending: Slowly worsening renal fct, but she is clearer today with less itching. Will begin dialysis education and decide about need for access placement. Kehinde Totzke C

## 2016-04-25 NOTE — Progress Notes (Signed)
ANTICOAGULATION CONSULT NOTE - Follow Up Consult  Pharmacy Consult for Lovenox and warfarin Indication: atrial fibrillation  Allergies  Allergen Reactions  . Contrast Media  [Iodinated Diagnostic Agents] Hives  . Prednisone Other (See Comments)    Confusion    Patient Measurements: Height: 5\' 2"  (157.5 cm) Weight: 176 lb 9.4 oz (80.1 kg) IBW/kg (Calculated) : 50.1  Vital Signs: Temp: 97.8 F (36.6 C) (08/10 0645) Temp Source: Oral (08/10 0645) BP: 128/60 (08/10 0645) Pulse Rate: 73 (08/10 0645)  Labs:  Recent Labs  04/23/16 0655 04/24/16 0516 04/25/16 0431 04/25/16 0741  HGB 9.6* 10.2* 9.8*  --   HCT 31.0* 32.7* 30.7*  --   PLT 226 251 188  --   LABPROT 18.1* 20.7* 23.4*  --   INR 1.49 1.75 2.04  --   CREATININE 3.68* 3.83*  --  3.86*    Estimated Creatinine Clearance: 12.5 mL/min (by C-G formula based on SCr of 3.86 mg/dL).   Assessment: 75 y/o female admitted 04/20/2016 with pruritis. She was on warfarin for hx Afib up until May when this was stopped due to concern for internal bleeding per husband. Warfarin resumed this admit. Pharmacy managing inpatient.  INR is therapeutic at 2.04 today. No bleeding noted, Hgb is low at 9.8 but stable, platelets are normal. SCr is trending upwards.  Goal of Therapy:  INR 2-3 Monitor platelets by anticoagulation protocol: Yes   Plan:  - Repeat warfarin 4 mg PO tonight - Daily INR - Monitor for s/sx of bleeding  Justice Deeds PharmD Candidate

## 2016-04-25 NOTE — Progress Notes (Signed)
Patient Name: Cassandra Bonilla Date of Encounter: 04/25/2016     Principal Problem:   Hypokalemia Active Problems:   Anemia of chronic disease   Chronic atrial fibrillation (HCC)   Type 2 diabetes mellitus with stage 4 chronic kidney disease, with long-term current use of insulin (HCC)   Acute on chronic systolic (congestive) heart failure (HCC)   CKD (chronic kidney disease), stage IV (HCC)   Fluid overload   Acute encephalopathy   Acute on chronic renal failure ([place])    SUBJECTIVE  Denies any CP or SOB. Has only been ambulating to the bathroom. Son not with her at this time.   CURRENT MEDS . allopurinol  100 mg Oral Daily  . aspirin EC  81 mg Oral Daily  . atorvastatin  40 mg Oral Daily  . furosemide  100 mg Intravenous Q8H  . insulin aspart  0-5 Units Subcutaneous QHS  . insulin aspart  0-9 Units Subcutaneous TID WC  . [START ON 04/26/2016] insulin glargine  5 Units Subcutaneous Daily  . lactulose  20 g Oral BID  . metolazone  5 mg Oral QODAY  . metoprolol succinate  50 mg Oral Daily  . pantoprazole  40 mg Oral BID  . potassium chloride  40 mEq Oral BID  . sodium chloride flush  3 mL Intravenous Q12H  . Warfarin - Pharmacist Dosing Inpatient   Does not apply q1800    OBJECTIVE  Vitals:   04/24/16 2136 04/25/16 0645 04/25/16 0645 04/25/16 1323  BP: 111/61  128/60 118/61  Pulse: 83  73 84  Resp: 17  17 18   Temp: 98.5 F (36.9 C)  97.8 F (36.6 C) 98.5 F (36.9 C)  TempSrc: Oral  Oral Oral  SpO2: 100%  100% 100%  Weight:  176 lb 9.4 oz (80.1 kg)    Height:        Intake/Output Summary (Last 24 hours) at 04/25/16 1807 Last data filed at 04/25/16 1659  Gross per 24 hour  Intake             1180 ml  Output             1650 ml  Net             -470 ml   Filed Weights   04/23/16 0344 04/24/16 0617 04/25/16 0645  Weight: 173 lb 4.5 oz (78.6 kg) 173 lb 15.1 oz (78.9 kg) 176 lb 9.4 oz (80.1 kg)    PHYSICAL EXAM  General: Pleasant, NAD. Neuro: Alert and  oriented X 2. Moves all extremities spontaneously. Psych: Normal affect. HEENT:  Normal  Neck: Supple without bruits or JVD. Lungs:  Resp regular and unlabored, CTA. Heart: irregularly irregular. no s3, s4. 3/6 systolic murmur Abdomen: Soft, non-tender, non-distended, BS + x 4.  Extremities: No clubbing, cyanosis. DP/PT/Radials 2+ and equal bilaterally. 1+ pitting edema   Accessory Clinical Findings  CBC  Recent Labs  04/24/16 0516 04/25/16 0431  WBC 5.3 5.0  HGB 10.2* 9.8*  HCT 32.7* 30.7*  MCV 90.1 87.7  PLT 251 0000000   Basic Metabolic Panel  Recent Labs  04/23/16 0655 04/24/16 0516 04/25/16 0741  NA 135 135 135  K 3.9 4.8 4.7  CL 93* 96* 96*  CO2 27 22 25   GLUCOSE 115* 182* 201*  BUN 76* 81* 81*  CREATININE 3.68* 3.83* 3.86*  CALCIUM 8.3* 8.4* 8.1*  PHOS 5.0* 5.3*  --    Liver Function Tests  Recent Labs  04/23/16 [id number]  04/24/16 0516  AST 27 29  ALT 15 16  ALKPHOS 256* 280*  BILITOT 1.3* 1.2  PROT 6.7 7.4  ALBUMIN 2.5* 2.7*  2.7*    TELE Afib, rate controlled    ECG  No new EKG  Echocardiogram 04/21/2016  Left ventricle: The cavity size was normal. Wall thickness was   normal. Systolic function was moderately to severely reduced. The   estimated ejection fraction was in the range of 30% to 35%.   Diffuse hypokinesis. There is akinesis of the inferolateral and   inferior myocardium. Doppler parameters are consistent with   restrictive physiology, indicative of decreased left ventricular   diastolic compliance and/or increased left atrial pressure.   Doppler parameters are consistent with high ventricular filling   pressure. - Mitral valve: Calcified annulus. There was moderate   regurgitation. - Left atrium: The atrium was mildly dilated. - Right atrium: The atrium was mildly dilated. - Atrial septum: The septum bowed from right to left, consistent   with increased right atrial pressure. - Tricuspid valve: There was severe regurgitation. -  Pulmonary arteries: PA peak pressure: 37 mm Hg (S).  Impressions:  - Global hypokinesis with akinesis of the inferior and inferior   lateral wall; overall moderate to severe LV dysfunction;   restrictive filling; moderate MR; mild biatrial enlargement;   severe TR.    Radiology/Studies  Dg Chest 2 View  Result Date: 04/21/2016 CLINICAL DATA:  75 year old female with history of acute on chronic systolic heart failure. EXAM: CHEST  2 VIEW COMPARISON:  Chest x-ray 03/22/2016. FINDINGS: Lung volumes are low. There is cephalization of the pulmonary vasculature and slight indistinctness of the interstitial markings suggestive of mild pulmonary edema. Small bilateral pleural effusions. Mild cardiomegaly. Upper mediastinal contours are within normal limits. Aortic atherosclerosis. Status post median sternotomy. Calcified granuloma projecting over the right upper lobe. IMPRESSION: 1. Persistent evidence of congestive heart failure, as above. 2. Aortic atherosclerosis. Electronically Signed   By: Vinnie Langton M.D.   On: 04/21/2016 15:14   US Renal  Result Date: 04/24/2016 CLINICAL DATA:  Stage 4 chronic kidney disease. EXAM: RENAL / URINARY TRACT ULTRASOUND COMPLETE COMPARISON:  April 22, 2016 complete abdomen ultrasound FINDINGS: Right Kidney: Length: 10.5 cm. There is diffuse increased echotexture of the right kidney. No mass or hydronephrosis visualized. Left Kidney: Length: 10.6 cm. There is diffuse increased echotexture of the left kidney. No mass or hydronephrosis visualized. Bladder: Not well seen with Foley catheter in place. There is incidental finding of right pleural effusion and ascites. IMPRESSION: No hydronephrosis identified bilaterally. Bilateral diffuse increased echotexture of the kidneys consistent with medical renal disease. Right pleural effusion and ascites. Electronically Signed   By: Abelardo Diesel M.D.   On: 04/24/2016 13:07   US Abdomen Limited  Result Date:  04/22/2016 CLINICAL DATA:  Elevated liver enzymes EXAM: US ABDOMEN LIMITED - RIGHT UPPER QUADRANT COMPARISON:  None. FINDINGS: Gallbladder: Surgically removed Common bile duct: Diameter: 2.3 mm. Liver: No focal mass lesion is noted. Echogenic portal venous walls are seen. This may be related to a component of underlying hepatitis. Bidirectional flow in the portal vein is noted. IMPRESSION: Status post cholecystectomy. Changes suggestive of underlying hepatic inflammatory change. Electronically Signed   By: Inez Catalina M.D.   On: 04/22/2016 16:04    ASSESSMENT AND PLAN  1. Acute on Chronic systolic heart failure - echo at [facility] showed an RVSP of 71mmHg along with biventricular failure, EF 40-45%. Echocardiogram this admission shows normal RVEF, EF  30-35% and current PA peak pressure 37 mmHg which is mildly elevated however not as severe. It is possible that the previous level was obtained during acute heart failure.  - weight at time of admission was lower than recent weight at time of discharge from OSH. Husband reports her weight is around 170 lbs at home. Weight 176 lbs on admission. Current weight 173 - urine output minimal. On 100mg  IV lasix gtt q8Hr. Her lung is clear, minimal edema in bilateral LE which may always present given severe tricuspid regurg. I think she is close to euvolemic level. Lasix managed by nephrology - continue BB. No ACE-I/ARB secondary to CKD. Consider addition of BiDil  2. Chronic atrial fibrillation - This patients CHA2DS2-VASc Score and unadjusted Ischemic Stroke Rate (% per year) is equal to 9.7 % stroke rate/year from a score of 6 (CHF, HTN, DM, Female, Age, CAD). Continue Coumadin for anticoagulation. - continue BB for rate control. Well rate controlled at this time.   3. Stage IV-V CKD  - creatinine 3.12 on admission Cr 3.68 --> Cr 3.83 ...3.86 ; slightly less alert today - Nephrology actively following.  4. CAD s/p 4v CABG - no obvious anginal symptoms. EKG  without acute ischemic changes - consider outpatient Lexiscan Myoview to assess for ischemia with further reduced EF when compared to previous studies.     Larae Grooms

## 2016-04-25 NOTE — Progress Notes (Signed)
Physical Therapy Treatment Patient Details Name: Cassandra Bonilla MRN: [id number] DOB: 18-Mar-1941 Today's Date: 04/25/2016    History of Present Illness Very pleasant 75 yo female with onset of CHF and hypokalemia, on coumadin and home with husband.  Had been to SNF and then home about 2 weeks, now back to hospital.  PMHx:  CHF, CKD, COPD, CAD, DM, gout, a-fib    PT Comments    Very little progression.  Today pt was preoccupied with the pruritis, not successful in walking further than the bedside chair and verbally deferring attempts at the steps, needed to get pt home.   Follow Up Recommendations  Home health PT;Supervision for mobility/OOB;Other (comment) (may need SNF if doesn't improve more quickly)     Equipment Recommendations  None recommended by PT    Recommendations for Other Services       Precautions / Restrictions      Mobility  Bed Mobility Overal bed mobility: Needs Assistance Bed Mobility: Supine to Sit     Supine to sit: Mod assist     General bed mobility comments: maximal assist to scoot, pt preoccupied with the itching.  Transfers Overall transfer level: Needs assistance Equipment used: Rolling walker (2 wheeled) Transfers: Sit to/from Stand Sit to Stand: Mod assist         General transfer comment: cues for sequencing an dassist to come forward and up.  Ambulation/Gait Ambulation/Gait assistance: Mod assist Ambulation Distance (Feet): 7 Feet Assistive device: Rolling walker (2 wheeled) Gait Pattern/deviations: Step-to pattern;Decreased step length - right;Decreased step length - left Gait velocity: very slow   General Gait Details: pt much less steady today, listing posteriorly and very preoccupied by her itching.   Stairs            Wheelchair Mobility    Modified Rankin (Stroke Patients Only)       Balance Overall balance assessment: Needs assistance Sitting-balance support: Bilateral upper extremity supported;Single extremity  supported Sitting balance-Leahy Scale: Fair (to poor today) Sitting balance - Comments: it took some time at EOB until pt able to maintain balance without assist of the therapist     Standing balance-Leahy Scale: Poor Standing balance comment: heavy list posteriorly                    Cognition Arousal/Alertness: Awake/alert Behavior During Therapy: WFL for tasks assessed/performed Overall Cognitive Status: Within Functional Limits for tasks assessed                      Exercises Other Exercises Other Exercises: Warm up hip/knee ROM exercise with resisted flexion and extension.    General Comments        Pertinent Vitals/Pain Pain Assessment: Faces Faces Pain Scale: Hurts little more Pain Location: arms and left thigh Pain Descriptors / Indicators: Jabbing;Stabbing (itching on arms) Pain Intervention(s): Monitored during session    Home Living                      Prior Function            PT Goals (current goals can now be found in the care plan section) Acute Rehab PT Goals Patient Stated Goal: to get home safely PT Goal Formulation: With patient/family Time For Goal Achievement: 05/06/16 Potential to Achieve Goals: Good Progress towards PT goals: Not progressing toward goals - comment    Frequency  Min 3X/week    PT Plan Other (comment) (may need to by updated  to retun to SNF)    Co-evaluation             End of Session   Activity Tolerance: Patient limited by fatigue;Patient limited by pain Patient left: in chair;with call bell/phone within reach;with chair alarm set     Time: 1332-1400 PT Time Calculation (min) (ACUTE ONLY): 28 min  Charges:  $Gait Training: 8-22 mins $Therapeutic Activity: 8-22 mins                    G Codes:      Cassandra Bonilla, Tessie Fass 04/25/2016, 3:16 PM  04/25/2016  Cassandra Bonilla, PT [phone]  (pager)

## 2016-04-26 ENCOUNTER — Inpatient Hospital Stay (HOSPITAL_COMMUNITY): Payer: Medicare Other

## 2016-04-26 DIAGNOSIS — E876 Hypokalemia: Secondary | ICD-10-CM

## 2016-04-26 DIAGNOSIS — R748 Abnormal levels of other serum enzymes: Secondary | ICD-10-CM

## 2016-04-26 DIAGNOSIS — G9340 Encephalopathy, unspecified: Secondary | ICD-10-CM

## 2016-04-26 DIAGNOSIS — L299 Pruritus, unspecified: Secondary | ICD-10-CM

## 2016-04-26 LAB — AMMONIA: Ammonia: 81 umol/L — ABNORMAL HIGH (ref 9–35)

## 2016-04-26 LAB — PROTIME-INR
INR: 2.32
Prothrombin Time: 25.9 seconds — ABNORMAL HIGH (ref 11.4–15.2)

## 2016-04-26 LAB — BASIC METABOLIC PANEL
Anion gap: 14 (ref 5–15)
BUN: 84 mg/dL — AB (ref 6–20)
CHLORIDE: 94 mmol/L — AB (ref 101–111)
CO2: 25 mmol/L (ref 22–32)
CREATININE: 3.94 mg/dL — AB (ref 0.44–1.00)
Calcium: 8.4 mg/dL — ABNORMAL LOW (ref 8.9–10.3)
GFR calc Af Amer: 12 mL/min — ABNORMAL LOW (ref >60)
GFR, EST NON AFRICAN AMERICAN: 10 mL/min — AB (ref >60)
GLUCOSE: 155 mg/dL — AB (ref 65–99)
POTASSIUM: 4.8 mmol/L (ref 3.5–5.1)
SODIUM: 133 mmol/L — AB (ref 135–145)

## 2016-04-26 LAB — GLUCOSE, CAPILLARY
Glucose-Capillary: 151 mg/dL — ABNORMAL HIGH (ref 65–99)
Glucose-Capillary: 253 mg/dL — ABNORMAL HIGH (ref 65–99)
Glucose-Capillary: 246 mg/dL — ABNORMAL HIGH (ref 65–99)
Glucose-Capillary: 232 mg/dL — ABNORMAL HIGH (ref 65–99)

## 2016-04-26 LAB — PHOSPHORUS: Phosphorus: 4.9 mg/dL — ABNORMAL HIGH (ref 2.5–4.6)

## 2016-04-26 MED ORDER — PANTOPRAZOLE SODIUM 40 MG PO TBEC
40.0000 mg | DELAYED_RELEASE_TABLET | Freq: Every day | ORAL | Status: DC
  Administered 2016-04-27 – 2016-05-03 (×6): 40 mg via ORAL
  Filled 2016-04-26 (×7): qty 1

## 2016-04-26 MED ORDER — FUROSEMIDE 80 MG PO TABS
240.0000 mg | ORAL_TABLET | Freq: Two times a day (BID) | ORAL | Status: DC
  Administered 2016-04-26 – 2016-04-28 (×4): 240 mg via ORAL
  Filled 2016-04-26 (×4): qty 3

## 2016-04-26 MED ORDER — NALTREXONE HCL 50 MG PO TABS
25.0000 mg | ORAL_TABLET | Freq: Every day | ORAL | Status: DC
Start: 2016-04-26 — End: 2016-04-30
  Administered 2016-04-26 – 2016-04-29 (×4): 25 mg via ORAL
  Filled 2016-04-26 (×6): qty 1

## 2016-04-26 MED ORDER — RIFAXIMIN 200 MG PO TABS
200.0000 mg | ORAL_TABLET | Freq: Three times a day (TID) | ORAL | Status: DC
  Administered 2016-04-26: 200 mg via ORAL
  Filled 2016-04-26 (×2): qty 1

## 2016-04-26 MED ORDER — WARFARIN SODIUM 4 MG PO TABS
4.0000 mg | ORAL_TABLET | Freq: Once | ORAL | Status: DC
  Filled 2016-04-26: qty 1

## 2016-04-26 MED ORDER — NALTREXONE HCL 50 MG PO TABS: 50.0000 mg | ORAL_TABLET | Freq: Every day | ORAL | Status: DC

## 2016-04-26 MED ORDER — RIFAXIMIN 200 MG PO TABS
400.0000 mg | ORAL_TABLET | Freq: Three times a day (TID) | ORAL | Status: DC
  Administered 2016-04-26 – 2016-05-03 (×20): 400 mg via ORAL
  Filled 2016-04-26 (×24): qty 2

## 2016-04-26 NOTE — Progress Notes (Signed)
    Telemetry reviewed.  Rate controlled.  No significant arrhythmias.  D/w nurse.    [name], MD

## 2016-04-26 NOTE — Progress Notes (Signed)
ANTICOAGULATION CONSULT NOTE - Follow Up Consult  Pharmacy Consult for Lovenox and warfarin Indication: atrial fibrillation  Allergies  Allergen Reactions  . Contrast Media  [Iodinated Diagnostic Agents] Hives  . Prednisone Other (See Comments)    Confusion    Patient Measurements: Height: 5\' 2"  (157.5 cm) Weight: 178 lb 3.2 oz (80.8 kg) IBW/kg (Calculated) : 50.1  Vital Signs: Temp: 97.6 F (36.4 C) (08/11 0549) Temp Source: Oral (08/11 0549) BP: 122/69 (08/11 0549) Pulse Rate: 83 (08/11 0549)  Labs:  Recent Labs  04/24/16 0516 04/25/16 0431 04/25/16 0741 04/26/16 0432  HGB 10.2* 9.8*  --   --   HCT 32.7* 30.7*  --   --   PLT 251 188  --   --   LABPROT 20.7* 23.4*  --  25.9*  INR 1.75 2.04  --  2.32  CREATININE 3.83*  --  3.86* 3.94*    Estimated Creatinine Clearance: 12.3 mL/min (by C-G formula based on SCr of 3.94 mg/dL).   Assessment: 75 y/o female admitted 04/20/2016 with pruritis. She was on warfarin for hx Afib up until May when this was stopped due to concern for internal bleeding per husband. Warfarin resumed this admit. Pharmacy managing inpatient. Rifaximin added 8/11 for hepatic encephalopathy. This may cause INR to decrease, monitor closely as higher dose of warfarin may be needed.  INR is therapeutic at 2.32 today. No bleeding noted, Hgb is low but stable, platelets are normal. SCr is trending upwards.  Goal of Therapy:  INR 2-3 Monitor platelets by anticoagulation protocol: Yes   Plan:  - Repeat warfarin 4 mg PO tonight - Daily INR, monitor for decrease in INR due to Rifaximin - Monitor for s/sx of bleeding  Justice Deeds PharmD Candidate

## 2016-04-26 NOTE — Care Management Important Message (Signed)
Important Message  Patient Details  Name: Cassandra Bonilla MRN: [id number] Date of Birth: 04-Aug-1941   Medicare Important Message Given:  Yes    [name] May 04/26/2016, 10:51 AM

## 2016-04-26 NOTE — Progress Notes (Signed)
Physical Therapy Treatment Patient Details Name: Cassandra Bonilla MRN: [id number] DOB: 1940-12-07 Today's Date: 04/26/2016    History of Present Illness Very pleasant 75 yo female with onset of CHF and hypokalemia, on coumadin and home with husband.  Had been to SNF and then home about 2 weeks, now back to hospital.  PMHx:  CHF, CKD, COPD, CAD, DM, gout, a-fib    PT Comments    Patient continues to make very little progress toward mobility goals and required mod/max A +2 for safety for OOB mobility. Pt "very sleepy" and needed cues to keep eyes open and attend to tasks. Husband present for session. Recommending [facility] for further skilled PT services to maximize independence and safety with mobility.   Follow Up Recommendations  Supervision for mobility/OOB;SNF     Equipment Recommendations  None recommended by PT    Recommendations for Other Services       Precautions / Restrictions Precautions Precautions: Fall Restrictions Weight Bearing Restrictions: No    Mobility  Bed Mobility Overal bed mobility: Needs Assistance Bed Mobility: Supine to Sit     Supine to sit: Mod assist     General bed mobility comments: assist to elevate trunk into sitting; HOB elevated and increased time with mod cues to atten to task  Transfers Overall transfer level: Needs assistance Equipment used: Rolling walker (2 wheeled) Transfers: Sit to/from Stand Sit to Stand: Mod assist;Max assist;+2 physical assistance         General transfer comment: mod +2 to power up into standing and max upon stand due to heavy posterior lean  Ambulation/Gait Ambulation/Gait assistance: Mod assist;+2 safety/equipment;Max assist Ambulation Distance (Feet): 15 Feet Assistive device: Rolling walker (2 wheeled) Gait Pattern/deviations: Step-through pattern;Decreased step length - right;Decreased step length - left;Decreased stride length Gait velocity: very slow   General Gait Details: mod to max to due to pt's  level of arousal; max cues to keep eyes open and verbal and tactile cues to advance bilat LE forward; pt with posterior lean in standing and with improvement while moving forward   Stairs            Wheelchair Mobility    Modified Rankin (Stroke Patients Only)       Balance     Sitting balance-Leahy Scale: Fair (to poor today) Sitting balance - Comments: it took some time at EOB until pt able to maintain balance without assist of the therapist     Standing balance-Leahy Scale: Poor                      Cognition Arousal/Alertness: Lethargic;Suspect due to medications Behavior During Therapy: WFL for tasks assessed/performed Overall Cognitive Status: Within Functional Limits for tasks assessed                      Exercises      General Comments        Pertinent Vitals/Pain Pain Assessment: Faces Faces Pain Scale: Hurts a little bit Pain Location: L LE  Pain Descriptors / Indicators: Sore Pain Intervention(s): Repositioned;Limited activity within patient's tolerance;Monitored during session    Home Living                      Prior Function            PT Goals (current goals can now be found in the care plan section) Acute Rehab PT Goals Patient Stated Goal: feel better PT Goal Formulation: With patient/family  Time For Goal Achievement: 05/06/16 Potential to Achieve Goals: Good Progress towards PT goals: Progressing toward goals    Frequency  Min 3X/week    PT Plan Discharge plan needs to be updated    Co-evaluation             End of Session Equipment Utilized During Treatment: Gait belt Activity Tolerance: Patient limited by fatigue;Patient limited by lethargy Patient left: in chair;with call bell/phone within reach;with chair alarm set;with family/visitor present     Time: [id number] PT Time Calculation (min) (ACUTE ONLY): 27 min  Charges:  $Gait Training: 8-22 mins $Therapeutic Activity: 8-22 mins                     G Codes:      Salina April, PTA Pager: [phone]   04/26/2016, 2:23 PM

## 2016-04-26 NOTE — Consult Note (Signed)
[facility] Consult: 10:08 AM 04/26/2016  LOS: 4 days    Referring Provider: Dr Erlinda Hong  Primary Care Physician:  Marco Collie, MD Primary Gastroenterologist:  unassigned    Reason for Consultation:  Abnormal liver imaging   HPI: Cassandra Bonilla is a 75 y.o. female.  Husband is a Company secretary so pt and he have elocated from [place] and then the [place], then to [place] (02/2016). So much of her previous medical care provided in the [place]. Type 2 DM, insulin requiring.  Gout.   S/p cholecystectomy. Hx CAD.  S/p 4 vsl CABG and subsequent stents placement.  Pulmonary htn. CHF; 04/21/16 echo: Global hypokinesis with akinesis of the inferior and inferior lateral wall; overall moderate to severe LV dysfunction restrictive filling; moderate MR; mild biatrial enlargement; severe TR.  Atrial fib, but Coumadin discontinued in 01/2016 due to anemia and ? GIB.  Left CEA 2014. Elevated parathyroid hormone 10/2015.  Anemia and FOBT + stool in 01/2016.  At [facility] in th [place] transfused 2 PRBCs and had unremarkable EGD and aborted colonoscopy due to poor prep.  No previous endo/colons before 01/2016.  BID Protonix initiated then, though has no significant reflux, pyrosis, excessive belching, dysphagia.   Hx abnormal LFTs but no definitive diagnosis of cirrhosis.  Per Care Everywhere from [facility] system 10/2015: Ultrasound 10/2015: liver is slightly heterogeneous and echogenic, which limits evaluation of small masses. No focal masses are seen. There is no evidence of intrahepatic biliary ductal dilation. There is mild perihepatic ascites.  0.3 mm CBD.  Mild perihepatic ascites.  MRI/MRCP 10/2015 for pruritus, abnormal LFTs; concern for cholestasis, PBC or PSC:   Markedly limited study, due to excessive motion and premature termination of  the study (MRCP sequences were not obtained).Within this limitation there is no evidence of intra or extra hepatic biliary ductal dilatation or definite evidence of cirrhosis Labs: 10/2015 elevated smooth muscle Ab IgA and IgG.   Elevated C4 complement.   Normal Mitochondrial Ab and Hepatitis serologies in 10/2015 and again in 04/2016.    Normal immunofixation electrophoresis.   She drank wine rarely in past. No tylenol.   CKD followed for a few years by renal specialist.  Has had pruritus since at least 02/2016 attributed to uremia, improved marginally with Zyrtec and a topical cream.  Several admissions over last year for: CHF, hypoglycemia, ? TIA (ruled out for CVA), hypokalemia.  Admission 7/7 - 03/24/16: with hypokalemia and pruritus. ALk phos was 220, transaminases and t bili normal.   Discharged on Atarax, Ferrous Sulfate, BID Protonix.  At discharge she was given phone # for [facility] to call and arrange outpt colonoscopy but no hemoccult testing performed.    Admission 8/5 with pruritus, increased edema and weight gain, recurrent hypokalemia, somnolence and mild to moderate confusion.  Admission not says she was taking Atarax but husbands med list shows Cetirizine (zyrtec).  Alk phos 280, T bili to 1.4normal transaminases.  Ammonia 51, started on Lactulose and Ammonia drifted to 40s and now to 81.  ANA AB positive, homogeneous patttern of 1:80.  Mitochondrial Ab 12.2 (rr is 0 - 20) Abdominal Ultrasound shows "echogenic portal venous walls... This may be related to a component of underlying hepatitis", s/p cholecystectomy. 2.3 mm CBD. Renal ultrasound shows ascites.  Coumadin restarted for A fib.  Renal ordered vein mapping but no emergent need for dialysis.   1 year ago was able to climb 13 flights of stairs to her appt, but significant dyspnea since Jan/Feb of 2017.  Now only able to ambulate with husband's assistance.  Has been sleepy and mildly  confused.   FM Hx negative for liver  disease or anemia or GI bleeding or colorectal cancer.     Past Medical History:  Diagnosis Date  . CHF (congestive heart failure) ([place])   . CKD (chronic kidney disease) stage 3, GFR 30-59 ml/min   . COPD (chronic obstructive pulmonary disease) ([place])   . Coronary artery disease   . Diabetes mellitus without complication ([place])   . Gout   . Hypertension   . Hypokalemia 03/21/2016    Past Surgical History:  Procedure Laterality Date  . CAROTID ENDARTERECTOMY    . CESAREAN SECTION    . CHOLECYSTECTOMY    . CORONARY ANGIOPLASTY    . CORONARY ARTERY BYPASS GRAFT      Prior to Admission medications   Medication Sig Start Date End Date Taking? Authorizing Provider  acetaminophen (TYLENOL) 650 MG CR tablet Take 1,300 mg by mouth at bedtime as needed for pain.   Yes Historical Provider, MD  allopurinol (ZYLOPRIM) 100 MG tablet Take 100 mg by mouth daily.   Yes Historical Provider, MD  amLODipine (NORVASC) 10 MG tablet Take 10 mg by mouth daily.   Yes Historical Provider, MD  aspirin EC 81 MG tablet Take 81 mg by mouth daily.   Yes Historical Provider, MD  atorvastatin (LIPITOR) 40 MG tablet Take 40 mg by mouth daily.   Yes Historical Provider, MD  furosemide (LASIX) 80 MG tablet Take 1 tablet (80 mg total) by mouth 2 (two) times daily. 03/31/16  Yes Hosie Poisson, MD  guaiFENesin-codeine (ROBITUSSIN AC) 100-10 MG/5ML syrup Take 10 mLs by mouth 3 (three) times daily as needed for cough.   Yes Historical Provider, MD  hydrOXYzine (ATARAX/VISTARIL) 25 MG tablet Take 1-2 tablets (25-50 mg total) by mouth every 6 (six) hours as needed for itching. Patient taking differently: Take 25-50 mg by mouth See admin instructions. Take 2 tablets (50 mg) by mouth daily at bedtime, may also take 1 tablet (25 mg) 3 times during the day as needed for itching 03/24/16  Yes Hosie Poisson, MD  insulin glargine (LANTUS) 100 UNIT/ML injection Inject 5 Units into the skin every morning.    Yes Historical Provider, MD    insulin lispro (HUMALOG) 100 UNIT/ML injection Inject 8-10 Units into the skin at bedtime. Takes 8 units if CBG <400, takes 10 units if CBG >400    Yes Historical Provider, MD  metolazone (ZAROXOLYN) 5 MG tablet Take 5 mg by mouth daily.    Yes Historical Provider, MD  pantoprazole (PROTONIX) 40 MG tablet Take 40 mg by mouth daily.    Yes Historical Provider, MD  polyethylene glycol (MIRALAX / GLYCOLAX) packet Take 17 g by mouth daily as needed for mild constipation. Mix in 8 oz liquid and drink 10/27/15  Yes Historical Provider, MD    Scheduled Meds: . allopurinol  100 mg Oral Daily  . aspirin EC  81 mg Oral Daily  . atorvastatin  40 mg Oral Daily  .  furosemide  100 mg Intravenous Q8H  . guaiFENesin  600 mg Oral BID  . insulin aspart  0-5 Units Subcutaneous QHS  . insulin aspart  0-9 Units Subcutaneous TID WC  . insulin glargine  5 Units Subcutaneous Daily  . lactulose  20 g Oral BID  . metolazone  5 mg Oral QODAY  . metoprolol succinate  50 mg Oral Daily  . pantoprazole  40 mg Oral BID  . potassium chloride  40 mEq Oral BID  . rifaximin  200 mg Oral TID  . sodium chloride flush  3 mL Intravenous Q12H  . Warfarin - Pharmacist Dosing Inpatient   Does not apply q1800   Infusions:   PRN Meds: acetaminophen **OR** acetaminophen, diphenhydrAMINE, hydrOXYzine   Allergies as of 04/20/2016 - Review Complete 04/20/2016  Allergen Reaction Noted  . Contrast media  [iodinated diagnostic agents] Hives 01/20/2013  . Prednisone Other (See Comments) 01/20/2013    Family History  Problem Relation Age of Onset  . Breast cancer Mother     Social History   Social History  . Marital status: Married    Spouse name: N/A  . Number of children: N/A  . Years of education: N/A   Occupational History  . Not on file.   Social History Main Topics  . Smoking status: Former Research scientist (life sciences)  . Smokeless tobacco: Never Used     Comment: quit smoking  in 1993  . Alcohol use No  . Drug use: No  .  Sexual activity: Not on file   Other Topics Concern  . Not on file   Social History Narrative  . No narrative on file    REVIEW OF SYSTEMS: Constitutional:  Per HPI.  Generally weak and unable to ambulate without assist.  ENT:  No nose bleeds Pulm:  Chronic wet sounding cough but not a lot of sputum CV:  No palpitations, no LE edema.  GU:  No hematuria, no frequency GI:  Per HPI Heme:  Per HPI   Transfusions:  Per HPI Neuro:  No headaches, no peripheral tingling or numbness Derm:  Per HPI Endocrine:  No sweats or chills.  No polyuria or dysuria Immunization:  Not queried.  Travel:  None beyond local counties in last few months.  Confusion   PHYSICAL EXAM: Vital signs in last 24 hours: Vitals:   04/25/16 2114 04/26/16 0549  BP: (!) 98/55 122/69  Pulse: 83 83  Resp: 20 18  Temp: 98.1 F (36.7 C) 97.6 F (36.4 C)   Wt Readings from Last 3 Encounters:  04/26/16 80.8 kg (178 lb 3.2 oz)  03/22/16 67.6 kg (149 lb)    General: sleepy but easily aroused.  Looks chronically ill.   Head:  No asymmetry, some facial swelling  Eyes:  No icterus, no conj pallor Ears:  Not HOH  Nose:  No discharge or congestion Mouth:  Moist, clear.  Tongue midline Neck:  No mass or JVD Lungs:  Unlabored resps, + cough.  Heart: irreg irreg, 3/6 systolic murmer Abdomen:  Obese, soft, NT, ND.  Healed incision scars at umbilicus.   Rectal: deferred   Musc/Skeltl: no joint erythema, swelling or contracture Extremities:  Trace LE edema but skin on left lower leg has linear grooves suggestive of recent and chronic more significant edema  Neurologic:  No asterixis.  Oriented x 3.  Psychomotor retardation.  Skin:  No rash or sores Tattoos:  none   Psych:  Pleasant, calm.    Intake/Output from previous day: 08/10  0701 - 08/11 0700 In: 580 [P.O.:460; IV Piggyback:120] Out: 1800 [Urine:1800] Intake/Output this shift: Total I/O In: -  Out: 400 [Urine:400]  LAB RESULTS:  Recent Labs   04/24/16 0516 04/25/16 0431  WBC 5.3 5.0  HGB 10.2* 9.8*  HCT 32.7* 30.7*  PLT 251 188   BMET Lab Results  Component Value Date   NA 133 (L) 04/26/2016   NA 135 04/25/2016   NA 135 04/24/2016   K 4.8 04/26/2016   K 4.7 04/25/2016   K 4.8 04/24/2016   CL 94 (L) 04/26/2016   CL 96 (L) 04/25/2016   CL 96 (L) 04/24/2016   CO2 25 04/26/2016   CO2 25 04/25/2016   CO2 22 04/24/2016   GLUCOSE 155 (H) 04/26/2016   GLUCOSE 201 (H) 04/25/2016   GLUCOSE 182 (H) 04/24/2016   BUN 84 (H) 04/26/2016   BUN 81 (H) 04/25/2016   BUN 81 (H) 04/24/2016   CREATININE 3.94 (H) 04/26/2016   CREATININE 3.86 (H) 04/25/2016   CREATININE 3.83 (H) 04/24/2016   CALCIUM 8.4 (L) 04/26/2016   CALCIUM 8.1 (L) 04/25/2016   CALCIUM 8.4 (L) 04/24/2016   LFT  Recent Labs  04/24/16 0516  PROT 7.4  ALBUMIN 2.7*  2.7*  AST 29  ALT 16  ALKPHOS 280*  BILITOT 1.2  BILIDIR 0.4  IBILI 0.8   PT/INR Lab Results  Component Value Date   INR 2.32 04/26/2016   INR 2.04 04/25/2016   INR 1.75 04/24/2016   Hepatitis Panel  Recent Labs  04/23/16 1827  HEPBSAG Negative  HCVAB <0.1  HEPAIGM Negative  HEPBIGM Negative     RADIOLOGY STUDIES: Dg Chest 2 View  Result Date: 04/25/2016 CLINICAL DATA:  Pt states she is here for pneumonia, has a very productive cough, , hx htn, chf, copd, , diabetes EXAM: CHEST  2 VIEW COMPARISON:  04/21/2016 FINDINGS: Changes from previous cardiac surgery stable. Cardiac silhouette is mildly enlarged. No mediastinal or hilar masses or evidence of adenopathy. There is streaky opacity at the lung bases. Lungs otherwise clear. There is a small left pleural effusion. No convincing pulmonary edema. No pneumothorax. Bony thorax is demineralized but grossly intact. IMPRESSION: 1. Streaky lung base opacity best seen on the lateral view, most likely due to atelectasis. Pneumonia is not excluded but felt less likely. 2. Cardiomegaly and small left pleural effusion, but no evidence of  pulmonary edema. Electronically Signed   By: Lajean Manes M.D.   On: 04/25/2016 18:38   US Renal  Result Date: 04/24/2016 CLINICAL DATA:  Stage 4 chronic kidney disease. EXAM: RENAL / URINARY TRACT ULTRASOUND COMPLETE COMPARISON:  April 22, 2016 complete abdomen ultrasound FINDINGS: Right Kidney: Length: 10.5 cm. There is diffuse increased echotexture of the right kidney. No mass or hydronephrosis visualized. Left Kidney: Length: 10.6 cm. There is diffuse increased echotexture of the left kidney. No mass or hydronephrosis visualized. Bladder: Not well seen with Foley catheter in place. There is incidental finding of right pleural effusion and ascites. IMPRESSION: No hydronephrosis identified bilaterally. Bilateral diffuse increased echotexture of the kidneys consistent with medical renal disease. Right pleural effusion and ascites. Electronically Signed   By: Abelardo Diesel M.D.   On: 04/24/2016 13:07    ENDOSCOPIC STUDIES: Per HPI  IMPRESSION:   *  Elevated alk phos, may be due to advanced renal disease vs underlying liver disease.    *  ? Cirrhosis (cardiac etiology vs due to AIH) ? Autoimmune hepatitis. Pt has limited hx of ETOH.  Mild ascites.  coags 16.5 and 1.3 at arrival, now elevated due to Coumadin.  * encehalpopathic - suspect hepatic Ammonia level rising despite Lactulose, so Dr Erlinda Hong just ordered Rifaximin.   *  Anemia and hx FOBT + 01/2016 but no current or previous overt GI bleeding or sxs.  On BID Protonix since 01/2016.  Transfused PRBC x 2 in 01/2016.   Suspect anemia of CKD.  Hgb is stable.  PO iron in place PTA but no epogen etc in place.    *  Progressive pruritus, attributed to Uremia.    *  CHF.       *  A fib, chronic.  Previously Coumadin discontinued 01/2016 for FOBT + and anemia but no overt GI bleeding.  Coumadin restarted 8/5 and therapeutic INR of 2.3 today.   *  Stage 4 CKD.  *  IDDM.     PLAN:     *  Per Dr Carlean Purl.  ? Does she need liver biopsy, of course  would need off Warfarin to do this.   *  ? Obtain a GGT or 5'-nucleotidase level/ to define source of alk phos?.   *  Switched her to once daily PPI.  Agree with Rifaximin + lactulose   Azucena Freed  04/26/2016, 10:08 AM Pager: [place] Attending   I have taken an interval history, reviewed the chart and examined the patient. I agree with the Advanced Practitioner's note, impression and recommendations.   Main differential here is cardiac related heart-failure causing liver dysfx vs autoimmune diseases (heopatitis, small duct PSC?) Alk phos elevation could be up from kidney failure but seems like she has liver dysfx - ? Could she have autoimmune kidney problems also  Drugs (allopurinol) could be causing problems also.  A liver bx is appropriate to sort this out and see if there is a way to treat (steroids?) -  I explained risks/benefits and alternatives to liver bx (empiric tx) to husband and he seemed not completely convinced but did accept mty recommendation to proceed w/ liver bx. Will need INR lower - holding warfarin and have asked pharmacy to start heparin when appropriate  Will f/u before Monday to see where INR is headed and if she can have liver bx Mon - I did order that.    Will try naltrexone for pruritis - REMEMBER IT IS AN OPIOID ANTAGONIST  Gatha Mayer, MD, [facility] [phone] (pager) [phone] after 5 PM, weekends and holidays  04/26/2016 4:11 PM

## 2016-04-26 NOTE — Progress Notes (Signed)
[facility] Progress Note  S: Patient distressed from significant pruritis. She has trouble sleeping. UOP 1800 ccs recorded yesterday, 400 ccs this morning  O: Medications: Infusions:   Scheduled Medications: . allopurinol  100 mg Oral Daily  . aspirin EC  81 mg Oral Daily  . atorvastatin  40 mg Oral Daily  . furosemide  240 mg Oral BID  . guaiFENesin  600 mg Oral BID  . insulin aspart  0-5 Units Subcutaneous QHS  . insulin aspart  0-9 Units Subcutaneous TID WC  . insulin glargine  5 Units Subcutaneous Daily  . lactulose  20 g Oral BID  . metolazone  5 mg Oral QODAY  . metoprolol succinate  50 mg Oral Daily  . [START ON 04/27/2016] pantoprazole  40 mg Oral Daily  . potassium chloride  40 mEq Oral BID  . rifaximin  200 mg Oral TID  . sodium chloride flush  3 mL Intravenous Q12H  . Warfarin - Pharmacist Dosing Inpatient   Does not apply q1800    Scheduled Meds: . allopurinol  100 mg Oral Daily  . aspirin EC  81 mg Oral Daily  . atorvastatin  40 mg Oral Daily  . furosemide  240 mg Oral BID  . guaiFENesin  600 mg Oral BID  . insulin aspart  0-5 Units Subcutaneous QHS  . insulin aspart  0-9 Units Subcutaneous TID WC  . insulin glargine  5 Units Subcutaneous Daily  . lactulose  20 g Oral BID  . metolazone  5 mg Oral QODAY  . metoprolol succinate  50 mg Oral Daily  . [START ON 04/27/2016] pantoprazole  40 mg Oral Daily  . potassium chloride  40 mEq Oral BID  . rifaximin  200 mg Oral TID  . sodium chloride flush  3 mL Intravenous Q12H  . Warfarin - Pharmacist Dosing Inpatient   Does not apply q1800   Continuous Infusions:  PRN Meds:.acetaminophen **OR** acetaminophen, diphenhydrAMINE, hydrOXYzine  BP 122/69 (BP Location: Left Arm)   Pulse 83   Temp 97.6 F (36.4 C) (Oral)   Resp 18   Ht 5' 2"  (1.575 m)   Wt 178 lb 3.2 oz (80.8 kg)   SpO2 97%   BMI 32.59 kg/m    Intake/Output Summary (Last 24 hours) at 04/26/16 1130 Last data filed at 04/26/16 0957  Gross per  24 hour  Intake              360 ml  Output             1825 ml  Net            -1465 ml    Weight change: 1 lb 9.8 oz (0.731 kg)  EXAM: General: sitting in chair, distressed from pruritis Cardiac: irr irr, no rubs, murmurs or gallops Pulm: no crackles heard, moving normal volumes of air Abd: soft, non tender, nondistended, BS present GU: foley Ext: trace pitting edema b/l LE with [place] appearance Neuro: alert and oriented to person and place    Labs: Basic Metabolic Panel:  Recent Labs Lab 04/20/16 2127  04/22/16 0425  04/23/16 0655 04/24/16 0516 04/25/16 0741 04/26/16 0432  NA  --   < > 134*  < > 135 135 135 133*  K  --   < > 3.7  < > 3.9 4.8 4.7 4.8  CL  --   < > 93*  < > 93* 96* 96* 94*  CO2  --   < > 28  < >  27 22 25 25   GLUCOSE  --   < > 144*  < > 115* 182* 201* 155*  BUN  --   < > 75*  < > 76* 81* 81* 84*  CREATININE  --   < > 3.49*  < > 3.68* 3.83* 3.86* 3.94*  CALCIUM  --   < > 8.3*  < > 8.3* 8.4* 8.1* 8.4*  MG 1.6*  --  1.9  --   --   --   --   --   PHOS  --   --   --   --  5.0* 5.3*  --  4.9*  < > = values in this interval not displayed.  Liver Function Tests:  Recent Labs Lab 04/22/16 1152 04/23/16 0655 04/24/16 0516  AST 32 27 29  ALT 17 15 16   ALKPHOS 269* 256* 280*  BILITOT 1.1 1.3* 1.2  PROT 7.2 6.7 7.4  ALBUMIN 2.8* 2.5* 2.7*  2.7*   No results for input(s): LIPASE, AMYLASE in the last 168 hours.  Recent Labs Lab 04/24/16 1642 04/25/16 0741 04/26/16 0432  AMMONIA 47* 48* 81*    CBC:  Recent Labs Lab 04/21/16 0533 04/22/16 0425 04/23/16 0655 04/24/16 0516 04/25/16 0431  WBC 6.0 6.0 5.6 5.3 5.0  HGB 9.4* 9.8* 9.6* 10.2* 9.8*  HCT 29.7* 31.1* 31.0* 32.7* 30.7*  MCV 89.7 89.1 90.9 90.1 87.7  PLT 221 245 226 251 188    Cardiac Enzymes: No results for input(s): CKTOTAL, CKMB, CKMBINDEX, TROPONINI in the last 168 hours.  CBG:  Recent Labs Lab 04/25/16 0801 04/25/16 1212 04/25/16 1652 04/25/16 2109 04/26/16 0801   GLUCAP 189* 214* 214* 215* 151*    Iron Studies: No results for input(s): IRON, TIBC, TRANSFERRIN, FERRITIN in the last 72 hours.  ABG    Component Value Date/Time   PHART 7.487 (H) 04/22/2016 1218   PCO2ART 36.6 04/22/2016 1218   PO2ART 80.5 04/22/2016 1218   HCO3 27.4 (H) 04/22/2016 1218   TCO2 28.5 04/22/2016 1218   O2SAT 96.1 04/22/2016 1218      Studies/Results: Dg Chest 2 View  Result Date: 04/25/2016 CLINICAL DATA:  Pt states she is here for pneumonia, has a very productive cough, , hx htn, chf, copd, , diabetes EXAM: CHEST  2 VIEW COMPARISON:  04/21/2016 FINDINGS: Changes from previous cardiac surgery stable. Cardiac silhouette is mildly enlarged. No mediastinal or hilar masses or evidence of adenopathy. There is streaky opacity at the lung bases. Lungs otherwise clear. There is a small left pleural effusion. No convincing pulmonary edema. No pneumothorax. Bony thorax is demineralized but grossly intact. IMPRESSION: 1. Streaky lung base opacity best seen on the lateral view, most likely due to atelectasis. Pneumonia is not excluded but felt less likely. 2. Cardiomegaly and small left pleural effusion, but no evidence of pulmonary edema. Electronically Signed   By: Lajean Manes M.D.   On: 04/25/2016 18:38   US Renal  Result Date: 04/24/2016 CLINICAL DATA:  Stage 4 chronic kidney disease. EXAM: RENAL / URINARY TRACT ULTRASOUND COMPLETE COMPARISON:  April 22, 2016 complete abdomen ultrasound FINDINGS: Right Kidney: Length: 10.5 cm. There is diffuse increased echotexture of the right kidney. No mass or hydronephrosis visualized. Left Kidney: Length: 10.6 cm. There is diffuse increased echotexture of the left kidney. No mass or hydronephrosis visualized. Bladder: Not well seen with Foley catheter in place. There is incidental finding of right pleural effusion and ascites. IMPRESSION: No hydronephrosis identified bilaterally. Bilateral diffuse increased echotexture of  the kidneys  consistent with medical renal disease. Right pleural effusion and ascites. Electronically Signed   By: Abelardo Diesel M.D.   On: 04/24/2016 13:07   Assessment/Plan: 75 year old female with PMH of CHF (EF 30-35% TTE 04/21/16), CKD Stage IV-V (baseline SCr 2.0-2.5), Atrial Fibrillation, CAD, HTN, IDDM, and Gout who was admitted with acute CHF exacerbation and pruritus  1. AoCKD IV-V - Baseline SCr 2.0-2.7. Up to 3.12 on admission. Possibly related to volume on board. May ultimately need dialysis for volume control, however no emergent need at this time. She has established with Dr. Hollie Salk as her primary Nephrologist now. Appears to have good response to IV lasix and near her euvolemic state. Will transition to oral Lasix. Will likely need higher home dose on discharge. -SCr slightly up at 3.94 from 3.86 yesterday -IV Lasix 100 mg q8h -> transition to po 240 mg BID -monitor renal function -No ACEI/ARB, avoid nephrotoxins -Renal U/S without hydronephrosis, shows incidental finding of right pleural effusion and ascites -Will obtain vein mapping to evaluate for dialysis access in the future, no emergent need for dialysis now  2. CHF - Has been on Lasix 80 mg po BID and Metolazone 5 mg daily outpatient.  -IV Lasix to 100 mg q8h --> transition to po 240 mg po BID -On Metolazone 5 mg every other day, consider change to prn -Continue potassium supplement -Monitor BPs with diuresis -Strict I/Os -Daily standing weights  3. Encephalopathy/?Uremia - Confusion last few days prior to admission, pruritus, isolated elevated Alkaline phosphatase with normal bilirubin. Abdominal U/S shows hx of cholecystectomy and echogenic portal venous walls. No asterixis on exam. -No emergent hemodialysis indicated -Lethargy secondary to medication/Atarax  4. Pruritus: Alk phos elevated with normal bili. ? Related to biliary disease, s/p cholecystectomy. PBC, PSC workup per primary. AMA negative, ANA 1:80. Possibly medication  related?  5. Hypokalemia: Repleted  6. Atrial Fibrillation - Rate controlled. Restarted on Warfarin this admission (held for ?GI bleed prior to this admission)  7. IDDM - Per primary  8. CAD/HTN - On lasix, metolazone, metoprolol, atorvastatin. Amlodipine d/c'ed to avoid hypotension.  Zada Finders, MD IMTS PGY2  Renal Attending: Itching remains a problem and some abnormal behaviors.  Will try to switch to PO diuretics and hope for some improvement in renal fct. Caedon Bond C

## 2016-04-26 NOTE — Progress Notes (Signed)
PROGRESS NOTE    Cassandra Bonilla  UQJ:[id number] DOB: Feb 15, 1941 DOA: 04/20/2016 PCP: Marco Collie, MD   Outpatient Specialists: Delsa Grana)- nephrology    Brief Narrative:  Cassandra Bonilla is a 75 y.o. female with a past medical history significant for Afib not on anticoagulation (due to anemia?), chronic systolic CHF EF 38%, anemia, CKD IV-V, and DM who presents with itching but is found to have hypokalemia again. Per history, the patient was ambulatory and able to climb "13 flights of stairs to our apartment" in [place] as recently as a year ago, but in February had a flare of CHF and worsening of her renal function (previously baseline Cr high 1s, subsequently 2-3), and since then has been more debilitated, walking only with a walker.  Two months ago she moved with her husband from [place], and was admitted with hypokalemia here 5 weeks ago.  Husband states this is the 6th hospitalization this calandar year.  Records from previous hospital reviewed and are in [place]. Echocardiogram in May 2017 in [place]: EF 40-45% Severe PH Moderate to severe TR   Assessment & Plan:   Principal Problem:   Hypokalemia Active Problems:   Anemia of chronic disease   Chronic atrial fibrillation (HCC)   Type 2 diabetes mellitus with stage 4 chronic kidney disease, with long-term current use of insulin (HCC)   Acute on chronic systolic (congestive) heart failure (HCC)   CKD (chronic kidney disease), stage IV (HCC)   Fluid overload   Acute encephalopathy   Acute on chronic renal failure (HCC)    Acute on Chronic systolic and right sided CHF:   From CKD and right sided and systolic CHF EF 93-73% in May at last echocardiogram in [place].  Pulmonary HTN.   Not on lisinopril because of renal insufficiency.  Echo EF 35 % by echo during this admission -strict I/Os, daily weights -discontinue Norvasc to avoid hypotension  -lasix IV plus PO metolazone -cardiology following   Acute on  chronic renal  insufficiency: CKD IV-V:   Per records cr range 2.7---2.9 Appreciate nephrology Dr Florene Glen help, Continue with IV lasix. If renal function deteriorate on IV lasix patient might need dialysis.   Encephalopathy, acute;  ABG no hypercapnia ,  elvated Ammonia level, see below Nephrology consulted, evaluation for uremia.     Elevated alk phos, elevated ammonia level; Pruritus:  [name] cholecystectomy and underline hepatic inflammatory changes.  Ammonia remain elevated despite lactulose, start rifaximin  Alk phosphatase elevated, pruritus. ANA mildly elevated, mitochondrial antibodies negative.   viral hepatitis panel negative Denies abdominal pain.  Gi consulted  Atrial fibrillation:  CHADS2-VASc 5.  Was on warfarin but this was stopped at some point because of her anemia.  There was never any bleeding event (melena or hematochezia), just that she was anemic.  --Warfarin with lovenox bridging dosed per pharmacy, now changed back to heparin drip due to planned liver biopsy -Continue metoprolol  Hypokalemia:  replaced -Mg 1.9   IDDM:  - home glargine held initially, restarted on 8/11 5units qham -Sliding scale corrections, renal dose  Coronary artery disease and HTN: History of CABG, PCI 2, CEA. -Continue asa, amlodipine, furosemide, metolazone, metorpolol, and statin    DVT prophylaxis:  Lovenox to coumadin Heparin drip from 8/11  Code Status: Full Code   Family Communication: Husband at bedside  Disposition Plan:  Remain inpatient. Liver biopsy early next week, eventually may need SNF  Consultants:   Cardiology  Nephrology   GI  Procedures:   none  Subjective:  Still has intermittent intense itching, drowsy today,  per husband patient has been sleeping during the day time and up at night for the last several months    Objective: Vitals:   04/25/16 0645 04/25/16 1323 04/25/16 2114 04/26/16 0549  BP: 128/60 118/61 (!) 98/55 122/69  Pulse: 73 84 83  83  Resp: 17 18 20 18   Temp: 97.8 F (36.6 C) 98.5 F (36.9 C) 98.1 F (36.7 C) 97.6 F (36.4 C)  TempSrc: Oral Oral Oral Oral  SpO2: 100% 100% 100% 97%  Weight:    80.8 kg (178 lb 3.2 oz)  Height:        Intake/Output Summary (Last 24 hours) at 04/26/16 0855 Last data filed at 04/26/16 0650  Gross per 24 hour  Intake              580 ml  Output             1800 ml  Net            -1220 ml   Filed Weights   04/24/16 0617 04/25/16 0645 04/26/16 0549  Weight: 78.9 kg (173 lb 15.1 oz) 80.1 kg (176 lb 9.4 oz) 80.8 kg (178 lb 3.2 oz)    Examination:  General exam: drowsy, drift back to sleep during conversation, oriented to person, know she is in the hospital, not oriented to time  Respiratory system: mild bibasilar Crackles, no wheezing Cardiovascular system: S1 & S2 heard, RRR. No JVD, murmurs, rubs, gallops or clicks- edema to thighs Gastrointestinal system: Abdomen is nondistended, soft and nontender. No organomegaly or masses felt. Normal bowel sounds heard. Extremities: moves all 4 ext, edema bilateral lower extremities     Data Reviewed: I have personally reviewed following labs and imaging studies  CBC:  Recent Labs Lab 04/21/16 0533 04/22/16 0425 04/23/16 0655 04/24/16 0516 04/25/16 0431  WBC 6.0 6.0 5.6 5.3 5.0  HGB 9.4* 9.8* 9.6* 10.2* 9.8*  HCT 29.7* 31.1* 31.0* 32.7* 30.7*  MCV 89.7 89.1 90.9 90.1 87.7  PLT 221 245 226 251 315   Basic Metabolic Panel:  Recent Labs Lab 04/20/16 2127  04/22/16 0425 04/22/16 1152 04/23/16 0655 04/24/16 0516 04/25/16 0741 04/26/16 0432  NA  --   < > 134* 134* 135 135 135 133*  K  --   < > 3.7 3.9 3.9 4.8 4.7 4.8  CL  --   < > 93* 95* 93* 96* 96* 94*  CO2  --   < > 28 22 27 22 25 25   GLUCOSE  --   < > 144* 193* 115* 182* 201* 155*  BUN  --   < > 75* 75* 76* 81* 81* 84*  CREATININE  --   < > 3.49* 3.52* 3.68* 3.83* 3.86* 3.94*  CALCIUM  --   < > 8.3* 8.4* 8.3* 8.4* 8.1* 8.4*  MG 1.6*  --  1.9  --   --   --    --   --   PHOS  --   --   --   --  5.0* 5.3*  --  4.9*  < > = values in this interval not displayed. GFR: Estimated Creatinine Clearance: 12.3 mL/min (by C-G formula based on SCr of 3.94 mg/dL). Liver Function Tests:  Recent Labs Lab 04/20/16 1630 04/21/16 0533 04/22/16 1152 04/23/16 0655 04/24/16 0516  AST 27 27 32 27 29  ALT 15 16 17 15 16   ALKPHOS 258* 265* 269* 256* 280*  BILITOT 1.4* 1.3* 1.1 1.3* 1.2  PROT 7.2 7.1 7.2 6.7 7.4  ALBUMIN 2.7* 2.6* 2.8* 2.5* 2.7*  2.7*   No results for input(s): LIPASE, AMYLASE in the last 168 hours.  Recent Labs Lab 04/22/16 1152 04/24/16 1642 04/25/16 0741 04/26/16 0432  AMMONIA 51* 47* 48* 81*   Coagulation Profile:  Recent Labs Lab 04/22/16 0425 04/23/16 0655 04/24/16 0516 04/25/16 0431 04/26/16 0432  INR 1.33 1.49 1.75 2.04 2.32   Cardiac Enzymes: No results for input(s): CKTOTAL, CKMB, CKMBINDEX, TROPONINI in the last 168 hours. BNP (last 3 results) No results for input(s): PROBNP in the last 8760 hours. HbA1C: No results for input(s): HGBA1C in the last 72 hours. CBG:  Recent Labs Lab 04/25/16 0801 04/25/16 1212 04/25/16 1652 04/25/16 2109 04/26/16 0801  GLUCAP 189* 214* 214* 215* 151*   Lipid Profile: No results for input(s): CHOL, HDL, LDLCALC, TRIG, CHOLHDL, LDLDIRECT in the last 72 hours. Thyroid Function Tests: No results for input(s): TSH, T4TOTAL, FREET4, T3FREE, THYROIDAB in the last 72 hours. Anemia Panel: No results for input(s): VITAMINB12, FOLATE, FERRITIN, TIBC, IRON, RETICCTPCT in the last 72 hours. Urine analysis:    Component Value Date/Time   COLORURINE YELLOW 04/23/2016 0015   APPEARANCEUR CLOUDY (A) 04/23/2016 0015   LABSPEC 1.009 04/23/2016 0015   PHURINE 7.0 04/23/2016 0015   GLUCOSEU NEGATIVE 04/23/2016 0015   HGBUR NEGATIVE 04/23/2016 0015   BILIRUBINUR NEGATIVE 04/23/2016 0015   KETONESUR NEGATIVE 04/23/2016 0015   PROTEINUR 30 (A) 04/23/2016 0015   NITRITE NEGATIVE  04/23/2016 0015   LEUKOCYTESUR NEGATIVE 04/23/2016 0015     )No results found for this or any previous visit (from the past 240 hour(s)).    Anti-infectives    None       Radiology Studies: Dg Chest 2 View  Result Date: 04/25/2016 CLINICAL DATA:  Pt states she is here for pneumonia, has a very productive cough, , hx htn, chf, copd, , diabetes EXAM: CHEST  2 VIEW COMPARISON:  04/21/2016 FINDINGS: Changes from previous cardiac surgery stable. Cardiac silhouette is mildly enlarged. No mediastinal or hilar masses or evidence of adenopathy. There is streaky opacity at the lung bases. Lungs otherwise clear. There is a small left pleural effusion. No convincing pulmonary edema. No pneumothorax. Bony thorax is demineralized but grossly intact. IMPRESSION: 1. Streaky lung base opacity best seen on the lateral view, most likely due to atelectasis. Pneumonia is not excluded but felt less likely. 2. Cardiomegaly and small left pleural effusion, but no evidence of pulmonary edema. Electronically Signed   By: Lajean Manes M.D.   On: 04/25/2016 18:38   US Renal  Result Date: 04/24/2016 CLINICAL DATA:  Stage 4 chronic kidney disease. EXAM: RENAL / URINARY TRACT ULTRASOUND COMPLETE COMPARISON:  April 22, 2016 complete abdomen ultrasound FINDINGS: Right Kidney: Length: 10.5 cm. There is diffuse increased echotexture of the right kidney. No mass or hydronephrosis visualized. Left Kidney: Length: 10.6 cm. There is diffuse increased echotexture of the left kidney. No mass or hydronephrosis visualized. Bladder: Not well seen with Foley catheter in place. There is incidental finding of right pleural effusion and ascites. IMPRESSION: No hydronephrosis identified bilaterally. Bilateral diffuse increased echotexture of the kidneys consistent with medical renal disease. Right pleural effusion and ascites. Electronically Signed   By: Abelardo Diesel M.D.   On: 04/24/2016 13:07        Scheduled Meds: . allopurinol   100 mg Oral Daily  . aspirin EC  81 mg Oral Daily  .  atorvastatin  40 mg Oral Daily  . furosemide  100 mg Intravenous Q8H  . guaiFENesin  600 mg Oral BID  . insulin aspart  0-5 Units Subcutaneous QHS  . insulin aspart  0-9 Units Subcutaneous TID WC  . insulin glargine  5 Units Subcutaneous Daily  . lactulose  20 g Oral BID  . metolazone  5 mg Oral QODAY  . metoprolol succinate  50 mg Oral Daily  . pantoprazole  40 mg Oral BID  . potassium chloride  40 mEq Oral BID  . sodium chloride flush  3 mL Intravenous Q12H  . Warfarin - Pharmacist Dosing Inpatient   Does not apply q1800   Continuous Infusions:    LOS: 4 days    Time spent: 52 min    Krupa Stege, MD PhD [facility] Pager [phone]  If 7PM-7AM, please contact night-coverage [email or web address] Password [facility] 04/26/2016, 8:55 AM

## 2016-04-26 NOTE — Progress Notes (Signed)
ANTICOAGULATION CONSULT NOTE - Initial Consult  Pharmacy Consult for heparin Indication: atrial fibrillation  Allergies  Allergen Reactions  . Contrast Media  [Iodinated Diagnostic Agents] Hives  . Prednisone Other (See Comments)    Confusion    Patient Measurements: Height: 5\' 2"  (157.5 cm) Weight: 178 lb 3.2 oz (80.8 kg) IBW/kg (Calculated) : 50.1 Heparin Dosing Weight: 68 kg  Vital Signs: Temp: 97.6 F (36.4 C) (08/11 0549) Temp Source: Oral (08/11 0549) BP: 122/69 (08/11 0549) Pulse Rate: 83 (08/11 0549)  Labs:  Recent Labs  04/24/16 0516 04/25/16 0431 04/25/16 0741 04/26/16 0432  HGB 10.2* 9.8*  --   --   HCT 32.7* 30.7*  --   --   PLT 251 188  --   --   LABPROT 20.7* 23.4*  --  25.9*  INR 1.75 2.04  --  2.32  CREATININE 3.83*  --  3.86* 3.94*    Estimated Creatinine Clearance: 12.3 mL/min (by C-G formula based on SCr of 3.94 mg/dL).   Medical History: Past Medical History:  Diagnosis Date  . CHF (congestive heart failure) ([place])   . CKD (chronic kidney disease) stage 3, GFR 30-59 ml/min   . COPD (chronic obstructive pulmonary disease) ([place])   . Coronary artery disease   . Diabetes mellitus without complication ([place])   . Gout   . Hypertension   . Hypokalemia 03/21/2016   Assessment: 75 y/o female admitted 04/20/2016 with pruritis. She was on warfarin for hx Afib up until May when this was stopped due to concern for internal bleeding per husband. Warfarin resumed this admit. Pharmacy managing inpatient. Rifaximin added 8/11 for hepatic encephalopathy. This may cause INR to decrease, monitor closely as higher dose of warfarin may be needed.  INR is therapeutic at 2.32 today. No bleeding noted, Hgb is low but stable, platelets are normal. SCr is trending upwards. Now with a plan to do liver biopsy once INR lower.   Goal of Therapy:  INR 2-3 Heparin level 0.3-0.7 units/ml Monitor platelets by anticoagulation protocol: Yes   Plan:  Hold Coumadin  tonight Start heparin gtt once INR < 2 F/U AM INR  Elenor Quinones, PharmD, BCPS Clinical Pharmacist Pager [phone] 04/26/2016 4:07 PM

## 2016-04-26 NOTE — Progress Notes (Signed)
*  Preliminary Results*  Study was technically limited and difficult due to poor patient cooperation and patient position.   Right  Upper Extremity Vein Map  Cephalic  Segment Diameter Depth Comment  1. Axilla 4.33mm 4.41mm   2. Mid upper arm 3.70mm 7.73mm   3. Above [facility] 2.72mm 5.53mm Branch  4. In [facility] 3.60mm 3.50mm   5. Below AC 2.65mm 11.38mm   6. Mid forearm 1.60mm 7.84mm   7. Wrist 1.40mm 3.43mm                   Basilic Unable to visualize   Left Upper Extremity Vein Map  Cephalic  Segment Diameter Depth Comment  1. Axilla 2.25mm 5.24mm   2. Mid upper arm 2.32mm 2.37mm   3. Above [facility] 1.6mm 5.23mm   4. In [facility]   Unable to visualize  5. Below AC   Unable to visualize  6. Mid forearm   Unable to visualize  7. Wrist   Unable to visualize                  Basilic Unable to visualize.  04/26/2016 4:34 PM Maudry Mayhew, BS, RVT, RDCS, RDMS

## 2016-04-27 ENCOUNTER — Inpatient Hospital Stay (HOSPITAL_COMMUNITY): Payer: Medicare Other

## 2016-04-27 LAB — RENAL FUNCTION PANEL
ALBUMIN: 2.6 g/dL — AB (ref 3.5–5.0)
ANION GAP: 13 (ref 5–15)
BUN: 85 mg/dL — ABNORMAL HIGH (ref 6–20)
CALCIUM: 8.5 mg/dL — AB (ref 8.9–10.3)
CO2: 26 mmol/L (ref 22–32)
Chloride: 97 mmol/L — ABNORMAL LOW (ref 101–111)
Creatinine, Ser: 3.82 mg/dL — ABNORMAL HIGH (ref 0.44–1.00)
GFR calc non Af Amer: 11 mL/min — ABNORMAL LOW (ref >60)
GFR, EST AFRICAN AMERICAN: 12 mL/min — AB (ref >60)
Glucose, Bld: 100 mg/dL — ABNORMAL HIGH (ref 65–99)
PHOSPHORUS: 4.9 mg/dL — AB (ref 2.5–4.6)
Potassium: 4.3 mmol/L (ref 3.5–5.1)
SODIUM: 136 mmol/L (ref 135–145)

## 2016-04-27 LAB — CBC
HCT: 31.3 % — ABNORMAL LOW (ref 36.0–46.0)
HEMOGLOBIN: 9.8 g/dL — AB (ref 12.0–15.0)
MCH: 27.1 pg (ref 26.0–34.0)
MCHC: 31.3 g/dL (ref 30.0–36.0)
MCV: 86.7 fL (ref 78.0–100.0)
Platelets: 286 10*3/uL (ref 150–400)
RBC: 3.61 MIL/uL — AB (ref 3.87–5.11)
RDW: 17.8 % — ABNORMAL HIGH (ref 11.5–15.5)
WBC: 5.3 10*3/uL (ref 4.0–10.5)

## 2016-04-27 LAB — GLUCOSE, CAPILLARY
GLUCOSE-CAPILLARY: 100 mg/dL — AB (ref 65–99)
GLUCOSE-CAPILLARY: 153 mg/dL — AB (ref 65–99)
GLUCOSE-CAPILLARY: 141 mg/dL — AB (ref 65–99)
GLUCOSE-CAPILLARY: 180 mg/dL — AB (ref 65–99)

## 2016-04-27 LAB — PROTIME-INR
INR: 2.44
Prothrombin Time: 26.9 seconds — ABNORMAL HIGH (ref 11.4–15.2)

## 2016-04-27 MED ORDER — OXYCODONE-ACETAMINOPHEN 5-325 MG PO TABS: 1.0000 | ORAL_TABLET | Freq: Three times a day (TID) | ORAL | Status: DC | PRN

## 2016-04-27 MED ORDER — ACETAMINOPHEN 10 MG/ML IV SOLN: 1000.0000 mg | Freq: Once | INTRAVENOUS | Status: DC

## 2016-04-27 MED ORDER — CYCLOBENZAPRINE HCL 5 MG PO TABS
5.0000 mg | ORAL_TABLET | Freq: Once | ORAL | Status: AC
  Administered 2016-04-27: 5 mg via ORAL
  Filled 2016-04-27: qty 1

## 2016-04-27 NOTE — Progress Notes (Signed)
ANTICOAGULATION CONSULT NOTE - Follow Up Consult  Pharmacy Consult for IV Heparin Indication: atrial fibrillation  Allergies  Allergen Reactions  . Contrast Media  [Iodinated Diagnostic Agents] Hives  . Prednisone Other (See Comments)    Confusion    Patient Measurements: Height: 5\' 2"  (157.5 cm) Weight: 179 lb 3.7 oz (81.3 kg) IBW/kg (Calculated) : 50.1 Heparin Dosing Weight: 68 kg  Vital Signs: Temp: 97 F (36.1 C) (08/12 0624) Temp Source: Oral (08/12 0624) BP: 105/62 (08/12 0624) Pulse Rate: 93 (08/12 0624)  Labs:  Recent Labs  04/25/16 0431 04/25/16 0741 04/26/16 0432 04/27/16 0658  HGB 9.8*  --   --  9.8*  HCT 30.7*  --   --  31.3*  PLT 188  --   --  286  LABPROT 23.4*  --  25.9* 26.9*  INR 2.04  --  2.32 2.44  CREATININE  --  3.86* 3.94* 3.82*    Estimated Creatinine Clearance: 12.8 mL/min (by C-G formula based on SCr of 3.82 mg/dL).  Assessment: 75 year old female with hx atrial fibrillation on chronic warfarin prior to admission now on hold for possible liver biopsy and pharmacy consult to start IV heparin when INR <2.   INR today remains above 2 at 2.44 (up from yesterday likely due to previous warfarin on 8/10). CBC low stable. No bleeding noted.   Goal of Therapy:  Heparin level 0.3-0.7 units/ml Monitor platelets by anticoagulation protocol: Yes   Plan:  Continue to hold heparin until INR <2. Follow-up plan for liver biopsy.  Follow-up daily INR for now  Sloan Leiter, PharmD, BCPS Clinical Pharmacist [phone] 04/27/2016,9:29 AM

## 2016-04-27 NOTE — Progress Notes (Signed)
Patient was assisted to [facility] and then started complaining of severe pain to her left leg and severe itching. I gave benadryl (provided no relief per pt) we also gave her tylenol for leg pain. Sent Dr. Erlinda Hong message on Amnion.

## 2016-04-27 NOTE — Progress Notes (Signed)
IR aware of request for bx.  Cassandra Bonilla E

## 2016-04-27 NOTE — Progress Notes (Signed)
PROGRESS NOTE    Cassandra Bonilla  WGY:[id number] DOB: May 11, 1941 DOA: 04/20/2016 PCP: Marco Collie, MD   Outpatient Specialists: Delsa Grana)- nephrology    Brief Narrative:  Cassandra Bonilla is a 75 y.o. female with a past medical history significant for Afib not on anticoagulation (due to anemia?), chronic systolic CHF EF 77%, anemia, CKD IV-V, and DM who presents with itching but is found to have hypokalemia again. Per history, the patient was ambulatory and able to climb "13 flights of stairs to our apartment" in [place] as recently as a year ago, but in February had a flare of CHF and worsening of her renal function (previously baseline Cr high 1s, subsequently 2-3), and since then has been more debilitated, walking only with a walker.  Two months ago she moved with her husband from [place], and was admitted with hypokalemia here 5 weeks ago.  Husband states this is the 6th hospitalization this calandar year.  Records from previous hospital reviewed and are in [place]. Echocardiogram in May 2017 in [place]: EF 40-45% Severe PH Moderate to severe TR   Assessment & Plan:   Principal Problem:   Hypokalemia Active Problems:   Anemia of chronic disease   Chronic atrial fibrillation (HCC)   Type 2 diabetes mellitus with stage 4 chronic kidney disease, with long-term current use of insulin (HCC)   Acute on chronic systolic (congestive) heart failure (HCC)   CKD (chronic kidney disease), stage IV (HCC)   Fluid overload   Acute encephalopathy   Acute on chronic renal failure (HCC)    Acute on Chronic systolic and right sided CHF:   From CKD and right sided and systolic CHF EF 93-90% in May at last echocardiogram in [place].  Pulmonary HTN.   Not on lisinopril because of renal insufficiency.  Echo EF 35 % by echo during this admission -strict I/Os, daily weights -discontinue Norvasc to avoid hypotension  -she received three doses of metolazone, and iv lasix,  Currently on oral lasix 277m  bid -cardiology /nephrology following   Acute on  chronic renal insufficiency: CKD IV-V:   Per records cr range 2.7---2.9 Appreciate nephrology Dr PFlorene Glenhelp, diuretics per nephrology,  patient might need dialysis.   Encephalopathy, acute;  ABG no hypercapnia ,  elvated Ammonia level, see below Nephrology consulted, evaluation for uremia.    Elevated alk phos, elevated ammonia level; Pruritus:  UKoreacholecystectomy and underline hepatic inflammatory changes.  Ammonia remain elevated despite lactulose, start rifaximin  Alk phosphatase elevated, pruritus. ANA mildly elevated, mitochondrial antibodies negative.   viral hepatitis panel negative Denies abdominal pain.  Gi consulted, plan to biopsy liver on monday  Atrial fibrillation:  CHADS2-VASc 5.  Was on warfarin but this was stopped at some point because of her anemia.  There was never any bleeding event (melena or hematochezia), just that she was anemic.  --Warfarin with lovenox bridging dosed per pharmacy, now changed back to heparin drip due to planned liver biopsy -Continue metoprolol  Hypokalemia:  replaced -Mg 1.9   IDDM:  - home glargine held initially, restarted on 8/11 5units qham -Sliding scale corrections, renal dose  Coronary artery disease and HTN: History of CABG, PCI 2, CEA. -Continue asa, amlodipine, furosemide, metolazone, metorpolol, and statin  Left leg pain, patient is not very specific with the location of the pain, report entire leg hurts, will get lumber /left hip/left knee x ray and venous uKoreato left lower extremity, prn pain meds Unfortunately opioid is not an option for pain  control for now due to she is on naltraxone for pruritis. She is not a candidate for NSAIDs due to renal impairment, only option for pain is tylenol for now, may consider iv tylenol if oral tylenol not effective Iv tylenol order is rejected by pharmacy to due " equal efficacy for pain control when compared to oral tylenol"   Will try flexeril for now, will discuss with Gi if naltraxone can be stopped, so opioid can be used for pain control if her pain persist  Addendum: x ray no acute findings, pain has resolved  DVT prophylaxis:  Lovenox to coumadin Heparin drip from 8/11  Code Status: Full Code   Family Communication: Husband at bedside  Disposition Plan:  Remain inpatient. Liver biopsy early next week, eventually may need SNF  Consultants:   Cardiology  Nephrology   GI  Procedures:   none  Subjective:  Sitting in chair, alert, c/o significant left leg pain after getting up to bedside commode, husband in room    Objective: Vitals:   04/26/16 0549 04/26/16 2123 04/27/16 0229 04/27/16 0624  BP: 122/69 (!) 122/98  105/62  Pulse: 83 88  93  Resp: _0 Temp: 97.6 F (36.4 C) 97.9 F (36.6 C)  97 F (36.1 C)  TempSrc: Oral   Oral  SpO2: 97% 98%  97%  Weight: 80.8 kg (178 lb 3.2 oz)  81.3 kg (179 lb 3.7 oz)   Height:        Intake/Output Summary (Last 24 hours) at 04/27/16 0823 Last data filed at 04/27/16 0600  Gross per 24 hour  Intake              420 ml  Output             1801 ml  Net            -1381 ml   Filed Weights   04/25/16 0645 04/26/16 0549 04/27/16 0229  Weight: 80.1 kg (176 lb 9.4 oz) 80.8 kg (178 lb 3.2 oz) 81.3 kg (179 lb 3.7 oz)    Examination:  General exam: in pain, does not want to talk much  Respiratory system: mild bibasilar Crackles, no wheezing Cardiovascular system: S1 & S2 heard, RRR. No JVD, murmurs, rubs, gallops or clicks- edema to thighs Gastrointestinal system: Abdomen is nondistended, soft and nontender. No organomegaly or masses felt. Normal bowel sounds heard. Extremities: moves all 4 ext, decreasing edema bilateral lower extremities     Data Reviewed: I have personally reviewed following labs and imaging studies  CBC:  Recent Labs Lab 04/22/16 0425 04/23/16 0655 04/24/16 0516 04/25/16 0431 04/27/16 0658  WBC 6.0  5.6 5.3 5.0 5.3  HGB 9.8* 9.6* 10.2* 9.8* 9.8*  HCT 31.1* 31.0* 32.7* 30.7* 31.3*  MCV 89.1 90.9 90.1 87.7 86.7  PLT 245 226 251 188 266   Basic Metabolic Panel:  Recent Labs Lab 04/20/16 2127  04/22/16 0425 04/22/16 1152 04/23/16 0655 04/24/16 0516 04/25/16 0741 04/26/16 0432  NA  --   < > 134* 134* 135 135 135 133*  K  --   < > 3.7 3.9 3.9 4.8 4.7 4.8  CL  --   < > 93* 95* 93* 96* 96* 94*  CO2  --   < > _1 GLUCOSE  --   < > 144* 193* 115* 182* 201* 155*  BUN  --   < > 75* 75* 76* 81* 81* 84*  CREATININE  --   < >  3.49* 3.52* 3.68* 3.83* 3.86* 3.94*  CALCIUM  --   < > 8.3* 8.4* 8.3* 8.4* 8.1* 8.4*  MG 1.6*  --  1.9  --   --   --   --   --   PHOS  --   --   --   --  5.0* 5.3*  --  4.9*  < > = values in this interval not displayed. GFR: Estimated Creatinine Clearance: 12.4 mL/min (by C-G formula based on SCr of 3.94 mg/dL). Liver Function Tests:  Recent Labs Lab 04/20/16 1630 04/21/16 0533 04/22/16 1152 04/23/16 0655 04/24/16 0516  AST 27 27 32 27 29  ALT _0 ALKPHOS 258* 265* 269* 256* 280*  BILITOT 1.4* 1.3* 1.1 1.3* 1.2  PROT 7.2 7.1 7.2 6.7 7.4  ALBUMIN 2.7* 2.6* 2.8* 2.5* 2.7*  2.7*   No results for input(s): LIPASE, AMYLASE in the last 168 hours.  Recent Labs Lab 04/22/16 1152 04/24/16 1642 04/25/16 0741 04/26/16 0432  AMMONIA 51* 47* 48* 81*   Coagulation Profile:  Recent Labs Lab 04/23/16 0655 04/24/16 0516 04/25/16 0431 04/26/16 0432 04/27/16 0658  INR 1.49 1.75 2.04 2.32 2.44   Cardiac Enzymes: No results for input(s): CKTOTAL, CKMB, CKMBINDEX, TROPONINI in the last 168 hours. BNP (last 3 results) No results for input(s): PROBNP in the last 8760 hours. HbA1C: No results for input(s): HGBA1C in the last 72 hours. CBG:  Recent Labs Lab 04/26/16 0801 04/26/16 1214 04/26/16 1716 04/26/16 2121 04/27/16 0754  GLUCAP 151* 253* 246* 232* 100*   Lipid Profile: No results for input(s): CHOL, HDL, LDLCALC,  TRIG, CHOLHDL, LDLDIRECT in the last 72 hours. Thyroid Function Tests: No results for input(s): TSH, T4TOTAL, FREET4, T3FREE, THYROIDAB in the last 72 hours. Anemia Panel: No results for input(s): VITAMINB12, FOLATE, FERRITIN, TIBC, IRON, RETICCTPCT in the last 72 hours. Urine analysis:    Component Value Date/Time   COLORURINE YELLOW 04/23/2016 0015   APPEARANCEUR CLOUDY (A) 04/23/2016 0015   LABSPEC 1.009 04/23/2016 0015   PHURINE 7.0 04/23/2016 0015   GLUCOSEU NEGATIVE 04/23/2016 0015   HGBUR NEGATIVE 04/23/2016 0015   BILIRUBINUR NEGATIVE 04/23/2016 0015   KETONESUR NEGATIVE 04/23/2016 0015   PROTEINUR 30 (A) 04/23/2016 0015   NITRITE NEGATIVE 04/23/2016 0015   LEUKOCYTESUR NEGATIVE 04/23/2016 0015     )No results found for this or any previous visit (from the past 240 hour(s)).    Anti-infectives    Start     Dose/Rate Route Frequency Ordered Stop   04/26/16 1600  rifaximin (XIFAXAN) tablet 400 mg     400 mg Oral 3 times daily 04/26/16 1200     04/26/16 1000  rifaximin (XIFAXAN) tablet 200 mg  Status:  Discontinued     200 mg Oral 3 times daily 04/26/16 0712 04/26/16 1200       Radiology Studies: Dg Chest 2 View  Result Date: 04/25/2016 CLINICAL DATA:  Pt states she is here for pneumonia, has a very productive cough, , hx htn, chf, copd, , diabetes EXAM: CHEST  2 VIEW COMPARISON:  04/21/2016 FINDINGS: Changes from previous cardiac surgery stable. Cardiac silhouette is mildly enlarged. No mediastinal or hilar masses or evidence of adenopathy. There is streaky opacity at the lung bases. Lungs otherwise clear. There is a small left pleural effusion. No convincing pulmonary edema. No pneumothorax. Bony thorax is demineralized but grossly intact. IMPRESSION: 1. Streaky lung base opacity best seen on the lateral view, most likely due to atelectasis.  Pneumonia is not excluded but felt less likely. 2. Cardiomegaly and small left pleural effusion, but no evidence of pulmonary edema.  Electronically Signed   By: Lajean Manes M.D.   On: 04/25/2016 18:38        Scheduled Meds: . allopurinol  100 mg Oral Daily  . aspirin EC  81 mg Oral Daily  . atorvastatin  40 mg Oral Daily  . furosemide  240 mg Oral BID  . guaiFENesin  600 mg Oral BID  . insulin aspart  0-5 Units Subcutaneous QHS  . insulin aspart  0-9 Units Subcutaneous TID WC  . insulin glargine  5 Units Subcutaneous Daily  . lactulose  20 g Oral BID  . metoprolol succinate  50 mg Oral Daily  . naltrexone  25 mg Oral Daily  . pantoprazole  40 mg Oral Daily  . potassium chloride  40 mEq Oral BID  . rifaximin  400 mg Oral TID  . sodium chloride flush  3 mL Intravenous Q12H  . Warfarin - Pharmacist Dosing Inpatient   Does not apply q1800   Continuous Infusions:    LOS: 5 days    Time spent: 37 min    Taffie Eckmann, MD PhD [facility] Pager [phone]  If 7PM-7AM, please contact night-coverage [email or web address] Password [facility] 04/27/2016, 8:23 AM

## 2016-04-27 NOTE — Progress Notes (Signed)
Subjective:    No issues overnight.   Objective:   Temp:  [97 F (36.1 C)-97.9 F (36.6 C)] 97 F (36.1 C) (08/12 0624) Pulse Rate:  [88-93] 93 (08/12 0624) Resp:  [17] 17 (08/12 0624) BP: (105-122)/(62-98) 105/62 (08/12 0624) SpO2:  [97 %-98 %] 97 % (08/12 0624) Weight:  [179 lb 3.7 oz (81.3 kg)] 179 lb 3.7 oz (81.3 kg) (08/12 0229) Last BM Date: 04/26/16  Filed Weights   04/25/16 0645 04/26/16 0549 04/27/16 0229  Weight: 176 lb 9.4 oz (80.1 kg) 178 lb 3.2 oz (80.8 kg) 179 lb 3.7 oz (81.3 kg)    Intake/Output Summary (Last 24 hours) at 04/27/16 0736 Last data filed at 04/27/16 0600  Gross per 24 hour  Intake              620 ml  Output             1801 ml  Net            -1181 ml    Exam:  General: NAD  HEENT: sclera clear, throat clear  Resp: CTAB  Cardiac: irreg, no m/r/g,  GI: abdomen soft, NT, ND  MSK: trace bilateral LE edema  Neuro: consfused but responds yes/no to questions, no focal deficits  Psych: appropriate affect  Lab Results:  Basic Metabolic Panel:  Recent Labs Lab 04/20/16 2127  04/22/16 0425  04/24/16 0516 04/25/16 0741 04/26/16 0432  NA  --   < > 134*  < > 135 135 133*  K  --   < > 3.7  < > 4.8 4.7 4.8  CL  --   < > 93*  < > 96* 96* 94*  CO2  --   < > 28  < > 22 25 25   GLUCOSE  --   < > 144*  < > 182* 201* 155*  BUN  --   < > 75*  < > 81* 81* 84*  CREATININE  --   < > 3.49*  < > 3.83* 3.86* 3.94*  CALCIUM  --   < > 8.3*  < > 8.4* 8.1* 8.4*  MG 1.6*  --  1.9  --   --   --   --   < > = values in this interval not displayed.  Liver Function Tests:  Recent Labs Lab 04/22/16 1152 04/23/16 0655 04/24/16 0516  AST 32 27 29  ALT 17 15 16   ALKPHOS 269* 256* 280*  BILITOT 1.1 1.3* 1.2  PROT 7.2 6.7 7.4  ALBUMIN 2.8* 2.5* 2.7*  2.7*    CBC:  Recent Labs Lab 04/23/16 0655 04/24/16 0516 04/25/16 0431  WBC 5.6 5.3 5.0  HGB 9.6* 10.2* 9.8*  HCT 31.0* 32.7* 30.7*  MCV 90.9 90.1 87.7  PLT 226 251 188     Cardiac Enzymes: No results for input(s): CKTOTAL, CKMB, CKMBINDEX, TROPONINI in the last 168 hours.  BNP: No results for input(s): PROBNP in the last 8760 hours.  Coagulation:  Recent Labs Lab 04/24/16 0516 04/25/16 0431 04/26/16 0432  INR 1.75 2.04 2.32    ECG:   Medications:   Scheduled Medications: . allopurinol  100 mg Oral Daily  . aspirin EC  81 mg Oral Daily  . atorvastatin  40 mg Oral Daily  . furosemide  240 mg Oral BID  . guaiFENesin  600 mg Oral BID  . insulin aspart  0-5 Units Subcutaneous QHS  . insulin aspart  0-9 Units Subcutaneous TID  WC  . insulin glargine  5 Units Subcutaneous Daily  . lactulose  20 g Oral BID  . metoprolol succinate  50 mg Oral Daily  . naltrexone  25 mg Oral Daily  . pantoprazole  40 mg Oral Daily  . potassium chloride  40 mEq Oral BID  . rifaximin  400 mg Oral TID  . sodium chloride flush  3 mL Intravenous Q12H  . Warfarin - Pharmacist Dosing Inpatient   Does not apply q1800     Infusions:     PRN Medications:  acetaminophen **OR** acetaminophen, diphenhydrAMINE, hydrOXYzine     Assessment/Plan    1. Acute on chronic systolic LV heart failure/Chronic RV failure - 04/2016 echo LVEF 30-35%, restrictive diastolic dysfunction. RV reported normal, PASP not reported - previous echo at [facility] showed PASP 75, RV dysfunction. This may have been in the setting of acute HF at the time - diuretics per renal  2. Chronic afib - This patients CHA2DS2-VASc Score and unadjusted Ischemic Stroke Rate (% per year) is equal to 9.7 % stroke rate/year from a score of 6(CHF, HTN, DM, Female, Age, CAD). Continue Coumadin for anticoagulation. - continue BB for rate control. Well rate controlled at this time.   3. CKD IV-V - followed by renal  4. CAD - previous CABG - no current symptoms.    Defer diuretic management to nephrology. No new cardiac recs at this time, call over the weekend if questions.   Carlyle Dolly, M.D.

## 2016-04-27 NOTE — Progress Notes (Signed)
[facility] Progress Note  S: Patient without pruritis this morning. She has difficulty getting quality sleep. UOP 1800 ccs recorded yesterday  O: Medications: Infusions:   Scheduled Medications: . allopurinol  100 mg Oral Daily  . aspirin EC  81 mg Oral Daily  . atorvastatin  40 mg Oral Daily  . furosemide  240 mg Oral BID  . guaiFENesin  600 mg Oral BID  . insulin aspart  0-5 Units Subcutaneous QHS  . insulin aspart  0-9 Units Subcutaneous TID WC  . insulin glargine  5 Units Subcutaneous Daily  . lactulose  20 g Oral BID  . metoprolol succinate  50 mg Oral Daily  . naltrexone  25 mg Oral Daily  . pantoprazole  40 mg Oral Daily  . potassium chloride  40 mEq Oral BID  . rifaximin  400 mg Oral TID  . sodium chloride flush  3 mL Intravenous Q12H  . Warfarin - Pharmacist Dosing Inpatient   Does not apply q1800    Scheduled Meds: . allopurinol  100 mg Oral Daily  . aspirin EC  81 mg Oral Daily  . atorvastatin  40 mg Oral Daily  . furosemide  240 mg Oral BID  . guaiFENesin  600 mg Oral BID  . insulin aspart  0-5 Units Subcutaneous QHS  . insulin aspart  0-9 Units Subcutaneous TID WC  . insulin glargine  5 Units Subcutaneous Daily  . lactulose  20 g Oral BID  . metoprolol succinate  50 mg Oral Daily  . naltrexone  25 mg Oral Daily  . pantoprazole  40 mg Oral Daily  . potassium chloride  40 mEq Oral BID  . rifaximin  400 mg Oral TID  . sodium chloride flush  3 mL Intravenous Q12H  . Warfarin - Pharmacist Dosing Inpatient   Does not apply q1800   Continuous Infusions:  PRN Meds:.acetaminophen **OR** acetaminophen, diphenhydrAMINE, hydrOXYzine  BP (!) 141/62 (BP Location: Left Arm)   Pulse 96   Temp 97.5 F (36.4 C)   Resp 20   Ht _0  (1.575 m)   Wt 179 lb 3.7 oz (81.3 kg)   SpO2 100%   BMI 32.78 kg/m    Intake/Output Summary (Last 24 hours) at 04/27/16 1122 Last data filed at 04/27/16 0800  Gross per 24 hour  Intake              720 ml  Output              1401 ml  Net             -681 ml    Weight change: 1 lb 0.5 oz (0.469 kg)  EXAM: General: sitting in chair Cardiac: irr irr, no rubs, murmurs or gallops Pulm: no crackles heard, moving normal volumes of air Abd: soft, non tender, nondistended, BS present GU: foley Ext: trace pitting edema b/l LE with Polkowski appearance Neuro: alert and oriented to person and place (tells me she is at [facility] hospital)    Labs: Basic Metabolic Panel:  Recent Labs Lab 04/20/16 2127  04/22/16 0425  04/24/16 0516 04/25/16 0741 04/26/16 0432 04/27/16 0658  NA  --   < > 134*  < > 135 135 133* 136  K  --   < > 3.7  < > 4.8 4.7 4.8 4.3  CL  --   < > 93*  < > 96* 96* 94* 97*  CO2  --   < > 28  < >  _0 GLUCOSE  --   < > 144*  < > 182* 201* 155* 100*  BUN  --   < > 75*  < > 81* 81* 84* 85*  CREATININE  --   < > 3.49*  < > 3.83* 3.86* 3.94* 3.82*  CALCIUM  --   < > 8.3*  < > 8.4* 8.1* 8.4* 8.5*  MG 1.6*  --  1.9  --   --   --   --   --   PHOS  --   --   --   < > 5.3*  --  4.9* 4.9*  < > = values in this interval not displayed.  Liver Function Tests:  Recent Labs Lab 04/22/16 1152 04/23/16 0655 04/24/16 0516 04/27/16 0658  AST 32 27 29  --   ALT _1 --   ALKPHOS 269* 256* 280*  --   BILITOT 1.1 1.3* 1.2  --   PROT 7.2 6.7 7.4  --   ALBUMIN 2.8* 2.5* 2.7*  2.7* 2.6*   No results for input(s): LIPASE, AMYLASE in the last 168 hours.  Recent Labs Lab 04/24/16 1642 04/25/16 0741 04/26/16 0432  AMMONIA 47* 48* 81*    CBC:  Recent Labs Lab 04/22/16 0425 04/23/16 0655 04/24/16 0516 04/25/16 0431 04/27/16 0658  WBC 6.0 5.6 5.3 5.0 5.3  HGB 9.8* 9.6* 10.2* 9.8* 9.8*  HCT 31.1* 31.0* 32.7* 30.7* 31.3*  MCV 89.1 90.9 90.1 87.7 86.7  PLT 245 226 251 188 286    Cardiac Enzymes: No results for input(s): CKTOTAL, CKMB, CKMBINDEX, TROPONINI in the last 168 hours.  CBG:  Recent Labs Lab 04/26/16 0801 04/26/16 1214 04/26/16 1716 04/26/16 2121  04/27/16 0754  GLUCAP 151* 253* 246* 232* 100*    Iron Studies: No results for input(s): IRON, TIBC, TRANSFERRIN, FERRITIN in the last 72 hours.  ABG    Component Value Date/Time   PHART 7.487 (H) 04/22/2016 1218   PCO2ART 36.6 04/22/2016 1218   PO2ART 80.5 04/22/2016 1218   HCO3 27.4 (H) 04/22/2016 1218   TCO2 28.5 04/22/2016 1218   O2SAT 96.1 04/22/2016 1218      Studies/Results: Dg Chest 2 View  Result Date: 04/25/2016 CLINICAL DATA:  Pt states she is here for pneumonia, has a very productive cough, , hx htn, chf, copd, , diabetes EXAM: CHEST  2 VIEW COMPARISON:  04/21/2016 FINDINGS: Changes from previous cardiac surgery stable. Cardiac silhouette is mildly enlarged. No mediastinal or hilar masses or evidence of adenopathy. There is streaky opacity at the lung bases. Lungs otherwise clear. There is a small left pleural effusion. No convincing pulmonary edema. No pneumothorax. Bony thorax is demineralized but grossly intact. IMPRESSION: 1. Streaky lung base opacity best seen on the lateral view, most likely due to atelectasis. Pneumonia is not excluded but felt less likely. 2. Cardiomegaly and small left pleural effusion, but no evidence of pulmonary edema. Electronically Signed   By: Lajean Manes M.D.   On: 04/25/2016 18:38   Assessment/Plan: 75 year old female with PMH of CHF (EF 30-35% TTE 04/21/16), CKD Stage IV-V (baseline SCr 2.0-2.5), Atrial Fibrillation, CAD, HTN, IDDM, and Gout who was admitted with acute CHF exacerbation and pruritus  1. AoCKD IV-V - Baseline SCr 2.0-2.7. Up to 3.12 on admission. May ultimately need dialysis for volume control, however no emergent need at this time. She has established with Dr. Hollie Salk as her primary Nephrologist now. Appears near her euvolemic state.  -  SCr 3.94 >> 3.82 -Lasix 240 mg po BID -monitor renal function -No ACEI/ARB, avoid nephrotoxins -Renal U/S without hydronephrosis, shows incidental finding of right pleural effusion and  ascites -Check PTH  2. CHF - Has been on Lasix 80 mg po BID and Metolazone 5 mg daily outpatient.  -Lasix 240 mg po BID -Discontinued Metolazone 5 mg every other day -Continue potassium supplement -Monitor BPs with diuresis -Strict I/Os -Daily standing weights  3. Encephalopathy/?Uremia - Confusion last few days prior to admission, pruritus, isolated elevated Alkaline phosphatase with normal bilirubin. Abdominal U/S shows hx of cholecystectomy and echogenic portal venous walls. No asterixis on exam. -GI following, plan for liver bx -No emergent hemodialysis indicated  4. Pruritus: Alk phos elevated with normal bili. ? Related to biliary disease, s/p cholecystectomy. PBC, PSC workup per primary. AMA negative, ANA 1:80. Possibly medication related?  5. Hypokalemia: Repleted  6. Atrial Fibrillation - Rate controlled. Restarted on Warfarin this admission (held for ?GI bleed prior to this admission)  7. IDDM - Per primary  8. CAD/HTN - On lasix, metoprolol, atorvastatin. Amlodipine d/c'ed to avoid hypotension.  Zada Finders, MD IMTS PGY2   Renal Attending: She is tolerating the higher dose of furosemide and volume is improving. Gustie Bobb C

## 2016-04-28 ENCOUNTER — Encounter (HOSPITAL_COMMUNITY): Payer: Self-pay | Admitting: General Surgery

## 2016-04-28 ENCOUNTER — Inpatient Hospital Stay (HOSPITAL_COMMUNITY): Payer: Medicare Other

## 2016-04-28 ENCOUNTER — Encounter (HOSPITAL_COMMUNITY): Payer: Medicare Other

## 2016-04-28 DIAGNOSIS — R748 Abnormal levels of other serum enzymes: Secondary | ICD-10-CM

## 2016-04-28 LAB — HEPATIC FUNCTION PANEL
ALBUMIN: 2.6 g/dL — AB (ref 3.5–5.0)
ALK PHOS: 241 U/L — AB (ref 38–126)
ALT: 13 U/L — AB (ref 14–54)
AST: 22 U/L (ref 15–41)
Bilirubin, Direct: 0.3 mg/dL (ref 0.1–0.5)
Indirect Bilirubin: 0.9 mg/dL (ref 0.3–0.9)
TOTAL PROTEIN: 6.9 g/dL (ref 6.5–8.1)
Total Bilirubin: 1.2 mg/dL (ref 0.3–1.2)

## 2016-04-28 LAB — BASIC METABOLIC PANEL
ANION GAP: 14 (ref 5–15)
BUN: 82 mg/dL — ABNORMAL HIGH (ref 6–20)
CHLORIDE: 97 mmol/L — AB (ref 101–111)
CO2: 25 mmol/L (ref 22–32)
Calcium: 8.5 mg/dL — ABNORMAL LOW (ref 8.9–10.3)
Creatinine, Ser: 3.83 mg/dL — ABNORMAL HIGH (ref 0.44–1.00)
GFR calc Af Amer: 12 mL/min — ABNORMAL LOW (ref >60)
GFR, EST NON AFRICAN AMERICAN: 11 mL/min — AB (ref >60)
Glucose, Bld: 146 mg/dL — ABNORMAL HIGH (ref 65–99)
POTASSIUM: 4.4 mmol/L (ref 3.5–5.1)
SODIUM: 136 mmol/L (ref 135–145)

## 2016-04-28 LAB — PROTIME-INR
INR: 2.35
Prothrombin Time: 26.1 seconds — ABNORMAL HIGH (ref 11.4–15.2)

## 2016-04-28 LAB — GLUCOSE, CAPILLARY
Glucose-Capillary: 150 mg/dL — ABNORMAL HIGH (ref 65–99)
Glucose-Capillary: 149 mg/dL — ABNORMAL HIGH (ref 65–99)
Glucose-Capillary: 165 mg/dL — ABNORMAL HIGH (ref 65–99)
Glucose-Capillary: 182 mg/dL — ABNORMAL HIGH (ref 65–99)

## 2016-04-28 LAB — AMMONIA: Ammonia: 74 umol/L — ABNORMAL HIGH (ref 9–35)

## 2016-04-28 LAB — MRSA PCR SCREENING: MRSA by PCR: NEGATIVE

## 2016-04-28 MED ORDER — FUROSEMIDE 80 MG PO TABS: 160.0000 mg | ORAL_TABLET | Freq: Two times a day (BID) | ORAL | Status: DC

## 2016-04-28 MED ORDER — IPRATROPIUM-ALBUTEROL 0.5-2.5 (3) MG/3ML IN SOLN
3.0000 mL | RESPIRATORY_TRACT | Status: DC | PRN
  Administered 2016-04-28: 3 mL via RESPIRATORY_TRACT
  Filled 2016-04-28: qty 3

## 2016-04-28 MED ORDER — IPRATROPIUM-ALBUTEROL 0.5-2.5 (3) MG/3ML IN SOLN
3.0000 mL | RESPIRATORY_TRACT | Status: DC
  Administered 2016-04-28: 3 mL via RESPIRATORY_TRACT
  Filled 2016-04-28: qty 3

## 2016-04-28 MED ORDER — FUROSEMIDE 80 MG PO TABS
160.0000 mg | ORAL_TABLET | Freq: Two times a day (BID) | ORAL | Status: DC
  Administered 2016-04-29 – 2016-05-03 (×8): 160 mg via ORAL
  Filled 2016-04-28 (×9): qty 2

## 2016-04-28 MED ORDER — CAMPHOR-MENTHOL 0.5-0.5 % EX LOTN
TOPICAL_LOTION | CUTANEOUS | Status: DC | PRN
  Administered 2016-04-28: 1 via TOPICAL
  Administered 2016-04-28: 18:00:00 via TOPICAL
  Administered 2016-04-30: 1 via TOPICAL
  Filled 2016-04-28: qty 222

## 2016-04-28 NOTE — Progress Notes (Signed)
Daily Rounding Note  04/28/2016, 11:19 AM  LOS: 6 days   SUBJECTIVE:   Chief complaint: pruritus     Itching improved. Confusion without agitation continues, orientation variable but at times unable to give her year of birth.  Stools liquid and watery, at least 3 yesterday.  No rectal bleeding or melena.  No pain.  Coughing and wheezing this AM: nebulizers ordered.    OBJECTIVE:         Vital signs in last 24 hours:    Temp:  [97.9 F (36.6 C)-98.9 F (37.2 C)] 97.9 F (36.6 C) (08/13 0439) Pulse Rate:  [61-94] 73 (08/13 0652) Resp:  [18-19] 19 (08/13 0439) BP: (100-144)/(40-111) 128/111 (08/13 0652) SpO2:  [96 %-100 %] 100 % (08/13 0652) Weight:  [74.8 kg (164 lb 12.8 oz)] 74.8 kg (164 lb 12.8 oz) (08/13 0439) Last BM Date: 04/27/16 Filed Weights   04/26/16 0549 04/27/16 0229 04/28/16 0439  Weight: 80.8 kg (178 lb 3.2 oz) 81.3 kg (179 lb 3.7 oz) 74.8 kg (164 lb 12.8 oz)   General: looks better but still ill.    Heart: RRR Chest: wheezing and crackles, no cough  Abdomen: soft, NT, obese.  Active BS  Extremities: + edema in legs and right arm Neuro/Psych:  Drowsy.  Oriented to self and hospital, can not name town or year.   Intake/Output from previous day: 08/12 0701 - 08/13 0700 In: 675 [P.O.:675] Out: 3080 [Urine:2700; Stool:380]   Lab Results:  Recent Labs  04/27/16 0658  WBC 5.3  HGB 9.8*  HCT 31.3*  PLT 286   BMET  Recent Labs  04/26/16 0432 04/27/16 0658 04/28/16 0734  NA 133* 136 136  K 4.8 4.3 4.4  CL 94* 97* 97*  CO2 _0 GLUCOSE 155* 100* 146*  BUN 84* 85* 82*  CREATININE 3.94* 3.82* 3.83*  CALCIUM 8.4* 8.5* 8.5*   LFT  Recent Labs  04/27/16 0658 04/28/16 0734  PROT  --  6.9  ALBUMIN 2.6* 2.6*  AST  --  22  ALT  --  13*  ALKPHOS  --  241*  BILITOT  --  1.2  BILIDIR  --  0.3  IBILI  --  0.9   PT/INR  Recent Labs  04/27/16 0658 04/28/16 0734  LABPROT 26.9*  26.1*  INR 2.44 2.35      ASSESMENT:   *  Elevated alk phos, may be due to advanced renal disease vs underlying liver disease.    *  ? Cirrhosis (cardiac etiology vs due to AIH) ? Autoimmune hepatitis, ? Small duct PSC. ? Contribution of Allopurinol.  Limited hx of ETOH.  Mild ascites.  coags 16.5 and 1.3 at arrival, now elevated due to Coumadin. Liver biopsy once INR 1.5 or less.  IR has seen pt this AM.  Currently INR is 2.3.    * Encephalopathy.  Multifactorial.  Ammonia remains elevated and still somewaht confused., on Lactulose, Rifaximin.    *  Anemia and hx FOBT + 01/2016 but no current or previous overt GI bleeding or sxs.  On BID Protonix since 01/2016.  Transfused PRBC x 2 in 01/2016.   Suspect anemia of CKD.  Hgb is stable.  PO iron in place PTA but no epogen etc in place.    *  Pruritus, previously attributed to Uremia.  ? Role of liver dz.  Naltrexone added 8/11 and pruritus improved.   *  CHF.       *  A fib, chronic.  Previously Coumadin discontinued 01/2016 for FOBT + and anemia but no overt GI bleeding.  Coumadin restarted 8/5 but reheld 8/11.   *  Stage 4 CKD.  *  IDDM.      PLAN   *  When INR reaches 1.5, proceed with liver biopsy.  coags in AM.  Ordered NPO after MN, just in case INR good to go tomorrow.     Azucena Freed  04/28/2016, 11:19 AM Pager: [phone]    [facility] Attending   I have taken an interval history, reviewed the chart and examined the patient. I agree with the Advanced Practitioner's note, impression and recommendations.    Gatha Mayer, MD, [facility] [phone] (pager) [phone] after 5 PM, weekends and holidays  04/28/2016 12:58 PM

## 2016-04-28 NOTE — Progress Notes (Signed)
ANTICOAGULATION CONSULT NOTE - Follow Up Consult  Pharmacy Consult for IV Heparin Indication: atrial fibrillation  Allergies  Allergen Reactions  . Contrast Media  [Iodinated Diagnostic Agents] Hives  . Prednisone Other (See Comments)    Confusion    Patient Measurements: Height: 5\' 2"  (157.5 cm) Weight: 164 lb 12.8 oz (74.8 kg) IBW/kg (Calculated) : 50.1 Heparin Dosing Weight: 68 kg  Vital Signs: Temp: 97.9 F (36.6 C) (08/13 0439) Temp Source: Oral (08/12 2133) BP: 128/111 (08/13 0652) Pulse Rate: 73 (08/13 0652)  Labs:  Recent Labs  04/26/16 0432 04/27/16 0658 04/28/16 0734  HGB  --  9.8*  --   HCT  --  31.3*  --   PLT  --  286  --   LABPROT 25.9* 26.9* 26.1*  INR 2.32 2.44 2.35  CREATININE 3.94* 3.82*  --     Estimated Creatinine Clearance: 12.2 mL/min (by C-G formula based on SCr of 3.82 mg/dL).  Assessment: 75 year old female with hx atrial fibrillation on chronic warfarin prior to admission now on hold for possible liver biopsy and pharmacy consult to start IV heparin when INR <2.   INR today remains above 2 at 2.35 (up from yesterday likely due to previous warfarin on 8/10). CBC low stable. No bleeding noted.   Goal of Therapy:  Heparin level 0.3-0.7 units/ml Monitor platelets by anticoagulation protocol: Yes   Plan:  Continue to hold heparin until INR <2. Follow-up plan for liver biopsy.  Follow-up daily INR for now  Sloan Leiter, PharmD, BCPS Clinical Pharmacist [phone] 04/28/2016,8:48 AM

## 2016-04-28 NOTE — Consult Note (Signed)
Chief Complaint: elevated liver function tests  Referring Physician:Dr. Silvano Rusk  Supervising Physician: Marybelle Killings  Patient Status: In-pt  HPI: Cassandra Bonilla is an 75 y.o. female who has multiple medical problems on coumadin for a fib who presented to the ED secondary to pruritis that is not responding to her home Atarax.  She has a history of CKD and gets this way sometimes with uremia, but needed relief.  Upon arrival she was noted to have some elevated LFTs as well as ammonia with encephalopathy.  She has been admitted for further evaluation for other problems just as hypokalemia and fluid overload.  GI has evaluated her and has asked for a random liver biopsy to determine if her liver dysfunction is secondary to her heart failure vs autoimmune disease.  Past Medical History:  Past Medical History:  Diagnosis Date  . CHF (congestive heart failure) ([facility])   . CKD (chronic kidney disease) stage 3, GFR 30-59 ml/min   . COPD (chronic obstructive pulmonary disease) ([place])   . Coronary artery disease   . Diabetes mellitus without complication ([place])   . Gout   . Hypertension   . Hypokalemia 03/21/2016    Past Surgical History:  Past Surgical History:  Procedure Laterality Date  . CAROTID ENDARTERECTOMY    . CESAREAN SECTION    . CHOLECYSTECTOMY    . CORONARY ANGIOPLASTY    . CORONARY ARTERY BYPASS GRAFT      Family History:  Family History  Problem Relation Age of Onset  . Breast cancer Mother     Social History:  reports that she has quit smoking. She has never used smokeless tobacco. She reports that she does not drink alcohol or use drugs.  Allergies:  Allergies  Allergen Reactions  . Contrast Media  [Iodinated Diagnostic Agents] Hives  . Prednisone Other (See Comments)    Confusion    Medications: Medications reviewed in Epic  Please HPI for pertinent positives, otherwise complete 10 system ROS negative.  Mallampati Score: MD Evaluation Airway:  WNL Heart: WNL Abdomen: WNL Chest/ Lungs: WNL ASA  Classification: 3 Mallampati/Airway Score: Two  Physical Exam: BP (!) 128/111   Pulse 73   Temp 97.9 F (36.6 C)   Resp 19   Ht 5' 2"  (1.575 m)   Wt 164 lb 12.8 oz (74.8 kg)   SpO2 100%   BMI 30.14 kg/m  Body mass index is 30.14 kg/m. General: pleasant,but confused black female who is laying in bed in NAD HEENT: head is normocephalic, atraumatic.  Sclera are noninjected.  PERRL.  Ears and nose without any masses or lesions.  Mouth is pink and moist Heart: regular, rate, and rhythm.  Normal s1,s2. +murmur No obvious gallops, or rubs noted.  Palpable radial and pedal pulses bilaterally Lungs: few crackles noted. Respiratory effort nonlabored Abd: soft, NT, ND, but slight fullness in upper abdomen, +BS, no masses, hernias, or organomegaly Psych: A&Ox2.  She knows she's in the hospital but unsure which one.  She doesn't know the year.   Labs: Results for orders placed or performed during the hospital encounter of 04/20/16 (from the past 48 hour(s))  Glucose, capillary     Status: Abnormal   Collection Time: 04/26/16 12:14 PM  Result Value Ref Range   Glucose-Capillary 253 (H) 65 - 99 mg/dL  Glucose, capillary     Status: Abnormal   Collection Time: 04/26/16  5:16 PM  Result Value Ref Range   Glucose-Capillary 246 (H) 65 - 99 mg/dL  Glucose, capillary     Status: Abnormal   Collection Time: 04/26/16  9:21 PM  Result Value Ref Range   Glucose-Capillary 232 (H) 65 - 99 mg/dL  Protime-INR     Status: Abnormal   Collection Time: 04/27/16  6:58 AM  Result Value Ref Range   Prothrombin Time 26.9 (H) 11.4 - 15.2 seconds   INR 2.44   Renal function panel     Status: Abnormal   Collection Time: 04/27/16  6:58 AM  Result Value Ref Range   Sodium 136 135 - 145 mmol/L   Potassium 4.3 3.5 - 5.1 mmol/L   Chloride 97 (L) 101 - 111 mmol/L   CO2 26 22 - 32 mmol/L   Glucose, Bld 100 (H) 65 - 99 mg/dL   BUN 85 (H) 6 - 20 mg/dL    Creatinine, Ser 3.82 (H) 0.44 - 1.00 mg/dL   Calcium 8.5 (L) 8.9 - 10.3 mg/dL   Phosphorus 4.9 (H) 2.5 - 4.6 mg/dL   Albumin 2.6 (L) 3.5 - 5.0 g/dL   GFR calc non Af Amer 11 (L) >60 mL/min   GFR calc Af Amer 12 (L) >60 mL/min    Comment: (NOTE) The eGFR has been calculated using the CKD EPI equation. This calculation has not been validated in all clinical situations. eGFR's persistently <60 mL/min signify possible Chronic Kidney Disease.    Anion gap 13 5 - 15  CBC     Status: Abnormal   Collection Time: 04/27/16  6:58 AM  Result Value Ref Range   WBC 5.3 4.0 - 10.5 K/uL   RBC 3.61 (L) 3.87 - 5.11 MIL/uL   Hemoglobin 9.8 (L) 12.0 - 15.0 g/dL   HCT 31.3 (L) 36.0 - 46.0 %   MCV 86.7 78.0 - 100.0 fL   MCH 27.1 26.0 - 34.0 pg   MCHC 31.3 30.0 - 36.0 g/dL   RDW 17.8 (H) 11.5 - 15.5 %   Platelets 286 150 - 400 K/uL  Glucose, capillary     Status: Abnormal   Collection Time: 04/27/16  7:54 AM  Result Value Ref Range   Glucose-Capillary 100 (H) 65 - 99 mg/dL  Glucose, capillary     Status: Abnormal   Collection Time: 04/27/16 11:33 AM  Result Value Ref Range   Glucose-Capillary 153 (H) 65 - 99 mg/dL  Glucose, capillary     Status: Abnormal   Collection Time: 04/27/16  4:58 PM  Result Value Ref Range   Glucose-Capillary 141 (H) 65 - 99 mg/dL  Glucose, capillary     Status: Abnormal   Collection Time: 04/27/16  9:31 PM  Result Value Ref Range   Glucose-Capillary 180 (H) 65 - 99 mg/dL  Ammonia     Status: Abnormal   Collection Time: 04/28/16  4:54 AM  Result Value Ref Range   Ammonia 74 (H) 9 - 35 umol/L  Protime-INR     Status: Abnormal   Collection Time: 04/28/16  7:34 AM  Result Value Ref Range   Prothrombin Time 26.1 (H) 11.4 - 15.2 seconds   INR 2.35   Glucose, capillary     Status: Abnormal   Collection Time: 04/28/16  7:42 AM  Result Value Ref Range   Glucose-Capillary 150 (H) 65 - 99 mg/dL    Imaging: Dg Knee 1-2 Views Left  Result Date: 04/27/2016 CLINICAL  DATA:  Left lower extremity pain.  No reported injury. EXAM: LEFT KNEE - 1-2 VIEW COMPARISON:  None. FINDINGS: No fracture, significant joint effusion,  dislocation or suspicious focal osseous lesion. Small marginal osteophyte at the inferior patella. Extensive vascular calcifications in the posterior soft tissues. Surgical clips in the medial soft tissues. IMPRESSION: No fracture, joint effusion or malalignment. Mild degenerative changes at the inferior patella. Electronically Signed   By: Ilona Sorrel M.D.   On: 04/27/2016 12:46   Dg Lumbar Spine 1 View  Result Date: 04/27/2016 CLINICAL DATA:  Back pain EXAM: LUMBAR SPINE - 1 VIEW COMPARISON:  None FINDINGS: Single AP view of the lumbar spine. No lateral view for correlation. Marked osseous demineralization. Five lumbar vertebra. Facet degenerative changes lower lumbar spine. No gross acute lumbar spine abnormalities identified on limited AP exam. Scattered atherosclerotic calcifications and pelvic phleboliths. Surgical clips RIGHT upper quadrant question cholecystectomy. IMPRESSION: Osseous demineralization with degenerative facet disease changes lower lumbar spine. No gross acute lumbar spine abnormality identified on single AP view. Aortic atherosclerosis. Electronically Signed   By: Lavonia Dana M.D.   On: 04/27/2016 12:47   Dg Hip Unilat With Pelvis 2-3 Views Left  Result Date: 04/27/2016 CLINICAL DATA:  Hip pain EXAM: DG HIP (WITH OR WITHOUT PELVIS) 2-3V LEFT COMPARISON:  None FINDINGS: Osseous demineralization. Hip and SI joint spaces symmetric and preserved. No acute fracture, dislocation, or bone destruction. Extensive atherosclerotic calcification. Calcified pelvic phleboliths. IMPRESSION: No acute osseous abnormalities. Electronically Signed   By: Lavonia Dana M.D.   On: 04/27/2016 12:49    Assessment/Plan 1. Elevated liver function tests -plan is for random liver biopsy to determine if this abnormality is secondary to heart failure vs  autoimmune disease.  -she has been on coumadin and her current INR is 2.32.  This is being held.  Once this is 1.5 then we can likely proceed with a biopsy.  She will be converted to a heparin drip so this will need to be held several hours before the biopsy as well. -it is possible if patient is other stable for discharge as it may take several days for her INR to drift, for this to be set up as an outpatient. -we will follow.  Her husband will have to give consent due to current state of confusion.  We will follow her INR while she is here.  Thank you for this interesting consult.  I greatly enjoyed meeting Porscha Axley and look forward to participating in their care.  A copy of this report was sent to the requesting provider on this date.  Electronically Signed: Henreitta Cea 04/28/2016, 8:43 AM   I spent a total of 40 Minutes   in face to face in clinical consultation, greater than 50% of which was counseling/coordinating care for elevated liver function tests.

## 2016-04-28 NOTE — Progress Notes (Signed)
[facility] Progress Note  S: Patient without pruritis this morning. UOP 2700 ccs recorded yesterday (+1 unmeasured occurrence) Wt 179 lbs >> 164 lbs overnight (? Accuracy, RN to reweigh today)  O: Medications: Infusions:   Scheduled Medications: . allopurinol  100 mg Oral Daily  . aspirin EC  81 mg Oral Daily  . atorvastatin  40 mg Oral Daily  . [START ON 04/29/2016] furosemide  160 mg Oral BID  . guaiFENesin  600 mg Oral BID  . insulin aspart  0-5 Units Subcutaneous QHS  . insulin aspart  0-9 Units Subcutaneous TID WC  . insulin glargine  5 Units Subcutaneous Daily  . ipratropium-albuterol  3 mL Nebulization Q4H  . lactulose  20 g Oral BID  . metoprolol succinate  50 mg Oral Daily  . naltrexone  25 mg Oral Daily  . pantoprazole  40 mg Oral Daily  . potassium chloride  40 mEq Oral BID  . rifaximin  400 mg Oral TID  . sodium chloride flush  3 mL Intravenous Q12H    Scheduled Meds: . allopurinol  100 mg Oral Daily  . aspirin EC  81 mg Oral Daily  . atorvastatin  40 mg Oral Daily  . [START ON 04/29/2016] furosemide  160 mg Oral BID  . guaiFENesin  600 mg Oral BID  . insulin aspart  0-5 Units Subcutaneous QHS  . insulin aspart  0-9 Units Subcutaneous TID WC  . insulin glargine  5 Units Subcutaneous Daily  . ipratropium-albuterol  3 mL Nebulization Q4H  . lactulose  20 g Oral BID  . metoprolol succinate  50 mg Oral Daily  . naltrexone  25 mg Oral Daily  . pantoprazole  40 mg Oral Daily  . potassium chloride  40 mEq Oral BID  . rifaximin  400 mg Oral TID  . sodium chloride flush  3 mL Intravenous Q12H   Continuous Infusions:  PRN Meds:.acetaminophen **OR** acetaminophen, camphor-menthol, diphenhydrAMINE, hydrOXYzine  BP 137/68 (BP Location: Right Arm)   Pulse 93   Temp 97.9 F (36.6 C)   Resp 19   Ht 5' 2"  (1.575 m)   Wt 164 lb 12.8 oz (74.8 kg)   SpO2 98%   BMI 30.14 kg/m    Intake/Output Summary (Last 24 hours) at 04/28/16 1244 Last data filed at 04/28/16  1143  Gross per 24 hour  Intake              825 ml  Output             3880 ml  Net            -3055 ml    Weight change: -14 lb 6.9 oz (-6.547 kg)  EXAM: General: resting in bed Cardiac: irr irr, no rubs, murmurs or gallops Pulm: no crackles heard, moving normal volumes of air Abd: soft, non tender, nondistended, BS present GU: foley Ext: trace pitting edema b/l LE with Flanagan appearance -> improving Neuro: alert and oriented to person and place (tells me she is at [facility]), flapping tremor left hand    Labs: Basic Metabolic Panel:  Recent Labs Lab 04/22/16 0425  04/24/16 0516  04/26/16 0432 04/27/16 0658 04/28/16 0734  NA 134*  < > 135  < > 133* 136 136  K 3.7  < > 4.8  < > 4.8 4.3 4.4  CL 93*  < > 96*  < > 94* 97* 97*  CO2 28  < > 22  < > 25  26 25  GLUCOSE 144*  < > 182*  < > 155* 100* 146*  BUN 75*  < > 81*  < > 84* 85* 82*  CREATININE 3.49*  < > 3.83*  < > 3.94* 3.82* 3.83*  CALCIUM 8.3*  < > 8.4*  < > 8.4* 8.5* 8.5*  MG 1.9  --   --   --   --   --   --   PHOS  --   < > 5.3*  --  4.9* 4.9*  --   < > = values in this interval not displayed.  Liver Function Tests:  Recent Labs Lab 04/23/16 0655 04/24/16 0516 04/27/16 0658 04/28/16 0734  AST 27 29  --  22  ALT 15 16  --  13*  ALKPHOS 256* 280*  --  241*  BILITOT 1.3* 1.2  --  1.2  PROT 6.7 7.4  --  6.9  ALBUMIN 2.5* 2.7*  2.7* 2.6* 2.6*   No results for input(s): LIPASE, AMYLASE in the last 168 hours.  Recent Labs Lab 04/25/16 0741 04/26/16 0432 04/28/16 0454  AMMONIA 48* 81* 74*    CBC:  Recent Labs Lab 04/22/16 0425 04/23/16 0655 04/24/16 0516 04/25/16 0431 04/27/16 0658  WBC 6.0 5.6 5.3 5.0 5.3  HGB 9.8* 9.6* 10.2* 9.8* 9.8*  HCT 31.1* 31.0* 32.7* 30.7* 31.3*  MCV 89.1 90.9 90.1 87.7 86.7  PLT 245 226 251 188 286    Cardiac Enzymes: No results for input(s): CKTOTAL, CKMB, CKMBINDEX, TROPONINI in the last 168 hours.  CBG:  Recent Labs Lab 04/27/16 1133  04/27/16 1658 04/27/16 2131 04/28/16 0742 04/28/16 1141  GLUCAP 153* 141* 180* 150* 149*    Iron Studies: No results for input(s): IRON, TIBC, TRANSFERRIN, FERRITIN in the last 72 hours.  ABG    Component Value Date/Time   PHART 7.487 (H) 04/22/2016 1218   PCO2ART 36.6 04/22/2016 1218   PO2ART 80.5 04/22/2016 1218   HCO3 27.4 (H) 04/22/2016 1218   TCO2 28.5 04/22/2016 1218   O2SAT 96.1 04/22/2016 1218      Studies/Results: Dg Knee 1-2 Views Left  Result Date: 04/27/2016 CLINICAL DATA:  Left lower extremity pain.  No reported injury. EXAM: LEFT KNEE - 1-2 VIEW COMPARISON:  None. FINDINGS: No fracture, significant joint effusion, dislocation or suspicious focal osseous lesion. Small marginal osteophyte at the inferior patella. Extensive vascular calcifications in the posterior soft tissues. Surgical clips in the medial soft tissues. IMPRESSION: No fracture, joint effusion or malalignment. Mild degenerative changes at the inferior patella. Electronically Signed   By: Ilona Sorrel M.D.   On: 04/27/2016 12:46   Dg Lumbar Spine 1 View  Result Date: 04/27/2016 CLINICAL DATA:  Back pain EXAM: LUMBAR SPINE - 1 VIEW COMPARISON:  None FINDINGS: Single AP view of the lumbar spine. No lateral view for correlation. Marked osseous demineralization. Five lumbar vertebra. Facet degenerative changes lower lumbar spine. No gross acute lumbar spine abnormalities identified on limited AP exam. Scattered atherosclerotic calcifications and pelvic phleboliths. Surgical clips RIGHT upper quadrant question cholecystectomy. IMPRESSION: Osseous demineralization with degenerative facet disease changes lower lumbar spine. No gross acute lumbar spine abnormality identified on single AP view. Aortic atherosclerosis. Electronically Signed   By: Lavonia Dana M.D.   On: 04/27/2016 12:47   Dg Hip Unilat With Pelvis 2-3 Views Left  Result Date: 04/27/2016 CLINICAL DATA:  Hip pain EXAM: DG HIP (WITH OR WITHOUT PELVIS)  2-3V LEFT COMPARISON:  None FINDINGS: Osseous demineralization. Hip and SI  joint spaces symmetric and preserved. No acute fracture, dislocation, or bone destruction. Extensive atherosclerotic calcification. Calcified pelvic phleboliths. IMPRESSION: No acute osseous abnormalities. Electronically Signed   By: Lavonia Dana M.D.   On: 04/27/2016 12:49   Assessment/Plan: 75 year old female with PMH of CHF (EF 30-35% TTE 04/21/16), CKD Stage IV-V (baseline SCr 2.0-2.5), Atrial Fibrillation, CAD, HTN, IDDM, and Gout who was admitted with acute CHF exacerbation and pruritus  1. AoCKD IV-V - Baseline SCr 2.0-2.7. Up to 3.12 on admission. May ultimately need dialysis for volume control, however no emergent need at this time. She has established with Dr. Hollie Salk as her primary Nephrologist now. Appears near her euvolemic state, will back off diuretics with improved volume status and to avoid hypotension in setting of right heard disease.  -SCr 3.94 >> 3.82 >> 3.83 -Lasix 240 mg po BID >> decrease to 160 mg po BID starting tomorrow -monitor renal function -No ACEI/ARB, avoid nephrotoxins -Renal U/S without hydronephrosis, shows incidental finding of right pleural effusion and ascites -f/u PTH  2. CHF - Has been on Lasix 80 mg po BID and Metolazone 5 mg daily outpatient.  -Lasix 240 mg po BID >> 160 mg po BID -Discontinued Metolazone 5 mg every other day -Continue potassium supplement -Monitor BPs with diuresis -Strict I/Os -Daily standing weights  3. Encephalopathy/?Uremia - Confusion last few days prior to admission, pruritus, isolated elevated Alkaline phosphatase with normal bilirubin. Abdominal U/S shows hx of cholecystectomy and echogenic portal venous walls. No asterixis on exam. -GI following, plan for liver bx when INR reaches 1.5  4. Pruritus: Alk phos elevated with normal bili. ? Related to biliary disease, s/p cholecystectomy. PBC, PSC workup per primary. AMA negative, ANA 1:80. Possibly  medication related?  5. Hypokalemia: Repleted  6. Atrial Fibrillation - Rate controlled. On Warfarin to heparin for anticoagulation.  7. IDDM - Per primary  8. CAD/HTN - On lasix, metoprolol, atorvastatin. Amlodipine d/c'ed to avoid hypotension.  Zada Finders, MD IMTS PGY2  Renal Attending: Her volume status is much improved and renal fct is stable.  Med ajustemnts as articulated above..  Live r workup in progess. PTH pending. Libia Fazzini C

## 2016-04-28 NOTE — Progress Notes (Signed)
PROGRESS NOTE    Cassandra Bonilla  WGY:[id number] DOB: May 11, 1941 DOA: 04/20/2016 PCP: Marco Collie, MD   Outpatient Specialists: Delsa Grana)- nephrology    Brief Narrative:  Cassandra Bonilla is a 75 y.o. female with a past medical history significant for Afib not on anticoagulation (due to anemia?), chronic systolic CHF EF 77%, anemia, CKD IV-V, and DM who presents with itching but is found to have hypokalemia again. Per history, the patient was ambulatory and able to climb "13 flights of stairs to our apartment" in [place] as recently as a year ago, but in February had a flare of CHF and worsening of her renal function (previously baseline Cr high 1s, subsequently 2-3), and since then has been more debilitated, walking only with a walker.  Two months ago she moved with her husband from [place], and was admitted with hypokalemia here 5 weeks ago.  Husband states this is the 6th hospitalization this calandar year.  Records from previous hospital reviewed and are in [place]. Echocardiogram in May 2017 in [place]: EF 40-45% Severe PH Moderate to severe TR   Assessment & Plan:   Principal Problem:   Hypokalemia Active Problems:   Anemia of chronic disease   Chronic atrial fibrillation (HCC)   Type 2 diabetes mellitus with stage 4 chronic kidney disease, with long-term current use of insulin (HCC)   Acute on chronic systolic (congestive) heart failure (HCC)   CKD (chronic kidney disease), stage IV (HCC)   Fluid overload   Acute encephalopathy   Acute on chronic renal failure (HCC)    Acute on Chronic systolic and right sided CHF:   From CKD and right sided and systolic CHF EF 93-90% in May at last echocardiogram in [place].  Pulmonary HTN.   Not on lisinopril because of renal insufficiency.  Echo EF 35 % by echo during this admission -strict I/Os, daily weights -discontinue Norvasc to avoid hypotension  -she received three doses of metolazone, and iv lasix,  Currently on oral lasix 277m  bid -cardiology /nephrology following   Acute on  chronic renal insufficiency: CKD IV-V:   Per records cr range 2.7---2.9 Appreciate nephrology Dr PFlorene Glenhelp, diuretics per nephrology,  patient might need dialysis.   Encephalopathy, acute;  ABG no hypercapnia ,  elvated Ammonia level, see below Nephrology consulted, evaluation for uremia.    Elevated alk phos, elevated ammonia level; Pruritus:  UKoreacholecystectomy and underline hepatic inflammatory changes.  Ammonia remain elevated despite lactulose, start rifaximin  Alk phosphatase elevated, pruritus. ANA mildly elevated, mitochondrial antibodies negative.   viral hepatitis panel negative Denies abdominal pain.  Gi consulted, plan to biopsy liver on monday  Atrial fibrillation:  CHADS2-VASc 5.  Was on warfarin but this was stopped at some point because of her anemia.  There was never any bleeding event (melena or hematochezia), just that she was anemic.  --Warfarin with lovenox bridging dosed per pharmacy, now changed back to heparin drip due to planned liver biopsy -Continue metoprolol  Hypokalemia:  replaced -Mg 1.9   IDDM:  - home glargine held initially, restarted on 8/11 5units qham -Sliding scale corrections, renal dose  Coronary artery disease and HTN: History of CABG, PCI 2, CEA. -Continue asa, amlodipine, furosemide, metolazone, metorpolol, and statin  Left leg pain, patient is not very specific with the location of the pain, report entire leg hurts, will get lumber /left hip/left knee x ray and venous uKoreato left lower extremity, prn pain meds Unfortunately opioid is not an option for pain  control for now due to she is on naltraxone for pruritis. She is not a candidate for NSAIDs due to renal impairment, only option for pain is tylenol for now, may consider iv tylenol if oral tylenol not effective Iv tylenol order is rejected by pharmacy to due " equal efficacy for pain control when compared to oral tylenol"   Will try flexeril for now, will discuss with Gi if naltraxone can be stopped, so opioid can be used for pain control if her pain persist  Addendum: x ray no acute findings, pain has resolved   cough and wheezing: h/o copd, start scheduled nebs, continue mucinex, will not start steroids prior to liver biopsy, cxy two view ordered, will f/u on result  DVT prophylaxis:  Lovenox to coumadin Heparin drip from 8/11  Code Status: Full Code   Family Communication: Husband at bedside  Disposition Plan:  Remain inpatient. Liver biopsy early next week, eventually may need SNF  Consultants:   Cardiology  Nephrology   GI  Procedures:   none  Subjective:  Developed cough and wheezing this am, on room air, not in respiratory distress Urine output 2.7 liter last 24hrs  Know she is in the hospital, but states she is at " [place]", not oriented to time    Objective: Vitals:   04/27/16 1411 04/27/16 2133 04/28/16 0439 04/28/16 0652  BP: 132/73 (!) 144/79 (!) 100/40 (!) 128/111  Pulse: 94 67 61 73  Resp: 19 18 19    Temp: 98.4 F (36.9 C) 98.9 F (37.2 C) 97.9 F (36.6 C)   TempSrc: Oral Oral    SpO2: 100% 96% 100% 100%  Weight:   74.8 kg (164 lb 12.8 oz)   Height:        Intake/Output Summary (Last 24 hours) at 04/28/16 0813 Last data filed at 04/28/16 0600  Gross per 24 hour  Intake              375 ml  Output             3080 ml  Net            -2705 ml   Filed Weights   04/26/16 0549 04/27/16 0229 04/28/16 0439  Weight: 80.8 kg (178 lb 3.2 oz) 81.3 kg (179 lb 3.7 oz) 74.8 kg (164 lb 12.8 oz)    Examination:  General exam: sleep with confusion, follow commands  Respiratory system: mild bibasilar Crackles, + wheezing, + rhonchi Cardiovascular system: S1 & S2 heard, RRR. No JVD, murmurs, rubs, gallops or clicks- edema to thighs Gastrointestinal system: Abdomen is nondistended, soft and nontender. No organomegaly or masses felt. Normal bowel sounds  heard. Extremities: moves all 4 ext, decreasing edema bilateral lower extremities     Data Reviewed: I have personally reviewed following labs and imaging studies  CBC:  Recent Labs Lab 04/22/16 0425 04/23/16 0655 04/24/16 0516 04/25/16 0431 04/27/16 0658  WBC 6.0 5.6 5.3 5.0 5.3  HGB 9.8* 9.6* 10.2* 9.8* 9.8*  HCT 31.1* 31.0* 32.7* 30.7* 31.3*  MCV 89.1 90.9 90.1 87.7 86.7  PLT 245 226 251 188 580   Basic Metabolic Panel:  Recent Labs Lab 04/22/16 0425  04/23/16 0655 04/24/16 0516 04/25/16 0741 04/26/16 0432 04/27/16 0658  NA 134*  < > 135 135 135 133* 136  K 3.7  < > 3.9 4.8 4.7 4.8 4.3  CL 93*  < > 93* 96* 96* 94* 97*  CO2 28  < > 27 22 25 25  26  GLUCOSE 144*  < > 115* 182* 201* 155* 100*  BUN 75*  < > 76* 81* 81* 84* 85*  CREATININE 3.49*  < > 3.68* 3.83* 3.86* 3.94* 3.82*  CALCIUM 8.3*  < > 8.3* 8.4* 8.1* 8.4* 8.5*  MG 1.9  --   --   --   --   --   --   PHOS  --   --  5.0* 5.3*  --  4.9* 4.9*  < > = values in this interval not displayed. GFR: Estimated Creatinine Clearance: 12.2 mL/min (by C-G formula based on SCr of 3.82 mg/dL). Liver Function Tests:  Recent Labs Lab 04/22/16 1152 04/23/16 0655 04/24/16 0516 04/27/16 0658  AST 32 27 29  --   ALT 17 15 16   --   ALKPHOS 269* 256* 280*  --   BILITOT 1.1 1.3* 1.2  --   PROT 7.2 6.7 7.4  --   ALBUMIN 2.8* 2.5* 2.7*  2.7* 2.6*   No results for input(s): LIPASE, AMYLASE in the last 168 hours.  Recent Labs Lab 04/22/16 1152 04/24/16 1642 04/25/16 0741 04/26/16 0432 04/28/16 0454  AMMONIA 51* 47* 48* 81* 74*   Coagulation Profile:  Recent Labs Lab 04/23/16 0655 04/24/16 0516 04/25/16 0431 04/26/16 0432 04/27/16 0658  INR 1.49 1.75 2.04 2.32 2.44   Cardiac Enzymes: No results for input(s): CKTOTAL, CKMB, CKMBINDEX, TROPONINI in the last 168 hours. BNP (last 3 results) No results for input(s): PROBNP in the last 8760 hours. HbA1C: No results for input(s): HGBA1C in the last 72  hours. CBG:  Recent Labs Lab 04/27/16 0754 04/27/16 1133 04/27/16 1658 04/27/16 2131 04/28/16 0742  GLUCAP 100* 153* 141* 180* 150*   Lipid Profile: No results for input(s): CHOL, HDL, LDLCALC, TRIG, CHOLHDL, LDLDIRECT in the last 72 hours. Thyroid Function Tests: No results for input(s): TSH, T4TOTAL, FREET4, T3FREE, THYROIDAB in the last 72 hours. Anemia Panel: No results for input(s): VITAMINB12, FOLATE, FERRITIN, TIBC, IRON, RETICCTPCT in the last 72 hours. Urine analysis:    Component Value Date/Time   COLORURINE YELLOW 04/23/2016 0015   APPEARANCEUR CLOUDY (A) 04/23/2016 0015   LABSPEC 1.009 04/23/2016 0015   PHURINE 7.0 04/23/2016 0015   GLUCOSEU NEGATIVE 04/23/2016 0015   HGBUR NEGATIVE 04/23/2016 0015   BILIRUBINUR NEGATIVE 04/23/2016 0015   KETONESUR NEGATIVE 04/23/2016 0015   PROTEINUR 30 (A) 04/23/2016 0015   NITRITE NEGATIVE 04/23/2016 0015   LEUKOCYTESUR NEGATIVE 04/23/2016 0015     )No results found for this or any previous visit (from the past 240 hour(s)).    Anti-infectives    Start     Dose/Rate Route Frequency Ordered Stop   04/26/16 1600  rifaximin (XIFAXAN) tablet 400 mg     400 mg Oral 3 times daily 04/26/16 1200     04/26/16 1000  rifaximin (XIFAXAN) tablet 200 mg  Status:  Discontinued     200 mg Oral 3 times daily 04/26/16 2482 04/26/16 1200       Radiology Studies: Dg Knee 1-2 Views Left  Result Date: 04/27/2016 CLINICAL DATA:  Left lower extremity pain.  No reported injury. EXAM: LEFT KNEE - 1-2 VIEW COMPARISON:  None. FINDINGS: No fracture, significant joint effusion, dislocation or suspicious focal osseous lesion. Small marginal osteophyte at the inferior patella. Extensive vascular calcifications in the posterior soft tissues. Surgical clips in the medial soft tissues. IMPRESSION: No fracture, joint effusion or malalignment. Mild degenerative changes at the inferior patella. Electronically Signed   By: Ilona Sorrel  M.D.   On:  04/27/2016 12:46   Dg Lumbar Spine 1 View  Result Date: 04/27/2016 CLINICAL DATA:  Back pain EXAM: LUMBAR SPINE - 1 VIEW COMPARISON:  None FINDINGS: Single AP view of the lumbar spine. No lateral view for correlation. Marked osseous demineralization. Five lumbar vertebra. Facet degenerative changes lower lumbar spine. No gross acute lumbar spine abnormalities identified on limited AP exam. Scattered atherosclerotic calcifications and pelvic phleboliths. Surgical clips RIGHT upper quadrant question cholecystectomy. IMPRESSION: Osseous demineralization with degenerative facet disease changes lower lumbar spine. No gross acute lumbar spine abnormality identified on single AP view. Aortic atherosclerosis. Electronically Signed   By: Lavonia Dana M.D.   On: 04/27/2016 12:47   Dg Hip Unilat With Pelvis 2-3 Views Left  Result Date: 04/27/2016 CLINICAL DATA:  Hip pain EXAM: DG HIP (WITH OR WITHOUT PELVIS) 2-3V LEFT COMPARISON:  None FINDINGS: Osseous demineralization. Hip and SI joint spaces symmetric and preserved. No acute fracture, dislocation, or bone destruction. Extensive atherosclerotic calcification. Calcified pelvic phleboliths. IMPRESSION: No acute osseous abnormalities. Electronically Signed   By: Lavonia Dana M.D.   On: 04/27/2016 12:49        Scheduled Meds: . allopurinol  100 mg Oral Daily  . aspirin EC  81 mg Oral Daily  . atorvastatin  40 mg Oral Daily  . furosemide  240 mg Oral BID  . guaiFENesin  600 mg Oral BID  . insulin aspart  0-5 Units Subcutaneous QHS  . insulin aspart  0-9 Units Subcutaneous TID WC  . insulin glargine  5 Units Subcutaneous Daily  . lactulose  20 g Oral BID  . metoprolol succinate  50 mg Oral Daily  . naltrexone  25 mg Oral Daily  . pantoprazole  40 mg Oral Daily  . potassium chloride  40 mEq Oral BID  . rifaximin  400 mg Oral TID  . sodium chloride flush  3 mL Intravenous Q12H  . Warfarin - Pharmacist Dosing Inpatient   Does not apply q1800    Continuous Infusions:    LOS: 6 days    Time spent: 36 min    Chauncey Bruno, MD PhD [facility] Pager [phone]  If 7PM-7AM, please contact night-coverage [email or web address] Password [facility] 04/28/2016, 8:13 AM

## 2016-04-29 ENCOUNTER — Inpatient Hospital Stay (HOSPITAL_COMMUNITY): Payer: Medicare Other

## 2016-04-29 LAB — BASIC METABOLIC PANEL
Anion gap: 10 (ref 5–15)
BUN: 78 mg/dL — ABNORMAL HIGH (ref 6–20)
CALCIUM: 8.4 mg/dL — AB (ref 8.9–10.3)
CO2: 26 mmol/L (ref 22–32)
CREATININE: 3.58 mg/dL — AB (ref 0.44–1.00)
Chloride: 99 mmol/L — ABNORMAL LOW (ref 101–111)
GFR, EST AFRICAN AMERICAN: 13 mL/min — AB (ref >60)
GFR, EST NON AFRICAN AMERICAN: 12 mL/min — AB (ref >60)
Glucose, Bld: 190 mg/dL — ABNORMAL HIGH (ref 65–99)
Potassium: 4.5 mmol/L (ref 3.5–5.1)
SODIUM: 135 mmol/L (ref 135–145)

## 2016-04-29 LAB — HEPATIC FUNCTION PANEL
ALK PHOS: 227 U/L — AB (ref 38–126)
ALT: 12 U/L — ABNORMAL LOW (ref 14–54)
AST: 19 U/L (ref 15–41)
Albumin: 2.5 g/dL — ABNORMAL LOW (ref 3.5–5.0)
BILIRUBIN DIRECT: 0.3 mg/dL (ref 0.1–0.5)
BILIRUBIN INDIRECT: 0.6 mg/dL (ref 0.3–0.9)
BILIRUBIN TOTAL: 0.9 mg/dL (ref 0.3–1.2)
Total Protein: 6.8 g/dL (ref 6.5–8.1)

## 2016-04-29 LAB — CBC
HEMATOCRIT: 31.4 % — AB (ref 36.0–46.0)
Hemoglobin: 9.7 g/dL — ABNORMAL LOW (ref 12.0–15.0)
MCH: 27.7 pg (ref 26.0–34.0)
MCHC: 30.9 g/dL (ref 30.0–36.0)
MCV: 89.7 fL (ref 78.0–100.0)
PLATELETS: 254 10*3/uL (ref 150–400)
RBC: 3.5 MIL/uL — ABNORMAL LOW (ref 3.87–5.11)
RDW: 18.2 % — AB (ref 11.5–15.5)
WBC: 4.5 10*3/uL (ref 4.0–10.5)

## 2016-04-29 LAB — GLUCOSE, CAPILLARY
GLUCOSE-CAPILLARY: 208 mg/dL — AB (ref 65–99)
GLUCOSE-CAPILLARY: 285 mg/dL — AB (ref 65–99)
Glucose-Capillary: 175 mg/dL — ABNORMAL HIGH (ref 65–99)
Glucose-Capillary: 123 mg/dL — ABNORMAL HIGH (ref 65–99)

## 2016-04-29 LAB — PROTIME-INR
INR: 2.18
PROTHROMBIN TIME: 24.6 s — AB (ref 11.4–15.2)

## 2016-04-29 LAB — AMMONIA: AMMONIA: 72 umol/L — AB (ref 9–35)

## 2016-04-29 LAB — PARATHYROID HORMONE, INTACT (NO CA): PTH: 149 pg/mL — ABNORMAL HIGH (ref 15–65)

## 2016-04-29 MED ORDER — PHYTONADIONE 5 MG PO TABS
2.5000 mg | ORAL_TABLET | Freq: Once | ORAL | Status: AC
  Administered 2016-04-29: 2.5 mg via ORAL
  Filled 2016-04-29: qty 1

## 2016-04-29 NOTE — Progress Notes (Signed)
ANTICOAGULATION CONSULT NOTE - Follow Up Consult  Pharmacy Consult for IV Heparin Indication: atrial fibrillation  Allergies  Allergen Reactions  . Contrast Media  [Iodinated Diagnostic Agents] Hives  . Prednisone Other (See Comments)    Confusion    Patient Measurements: Height: 5\' 2"  (157.5 cm) Weight: 163 lb 1.6 oz (74 kg) IBW/kg (Calculated) : 50.1 Heparin Dosing Weight: 68 kg  Vital Signs: Temp: 97.9 F (36.6 C) (08/14 0450) Temp Source: Oral (08/14 0450) BP: 113/61 (08/14 0450) Pulse Rate: 89 (08/14 0450)  Labs:  Recent Labs  04/27/16 0658 04/28/16 0734 04/29/16 0554  HGB 9.8*  --  9.7*  HCT 31.3*  --  31.4*  PLT 286  --  254  LABPROT 26.9* 26.1* 24.6*  INR 2.44 2.35 2.18  CREATININE 3.82* 3.83* 3.58*    Estimated Creatinine Clearance: 13 mL/min (by C-G formula based on SCr of 3.58 mg/dL).  Assessment: 75 year old female with hx atrial fibrillation on chronic warfarin prior to admission now on hold for possible liver biopsy and pharmacy consult to start IV heparin when INR <2.   INR today remains above 2 at 2.18 but trending down. CBC low stable. No bleeding noted.   Goal of Therapy:  Heparin level 0.3-0.7 units/ml Monitor platelets by anticoagulation protocol: Yes   Plan:  Continue to hold heparin until INR <2. Follow-up plan for liver biopsy.  Follow-up daily INR for now   Stephens November, PharmD Clinical Pharmacist 8:20 AM, 04/29/2016

## 2016-04-29 NOTE — Progress Notes (Signed)
PT Cancellation Note  Patient Details Name: Cassandra Bonilla MRN: [id number] DOB: January 04, 1941   Cancelled Treatment:    Reason Eval/Treat Not Completed: Patient declined, no reason specifiedPt declined this am due to having an busy morning but very alert. Pt declined again this pm and sleeping upon arrival with pt waking up long enough to say no despite max encouragement from therapist and husband. PT will continue to follow acutely.    Salina April, PTA Pager: [phone]   04/29/2016, 2:41 PM

## 2016-04-29 NOTE — Progress Notes (Signed)
Patient ID: Cassandra Bonilla, female   DOB: November 08, 1940, 75 y.o.   MRN: [id number]   Request for liver random core biopsy in IR  Pt seen and consult note in chart  INR 2.13 today Must be 1.5 to safely proceed Can offer as OP if MD feels appropriate  Let us know  Will continue to follow chart as IP  Will move ahead with bx when can safely perform

## 2016-04-29 NOTE — Progress Notes (Signed)
Awaiting liver biopsy via IR, to be done when INR in safe range.

## 2016-04-29 NOTE — Progress Notes (Signed)
[facility] Progress Note  S: Patient feels well this morning. Husband at bedside. UOP 2600 ccs recorded yesterday, 1000 ccs this am   O: Medications: Infusions:   Scheduled Medications: . allopurinol  100 mg Oral Daily  . aspirin EC  81 mg Oral Daily  . atorvastatin  40 mg Oral Daily  . furosemide  160 mg Oral BID  . guaiFENesin  600 mg Oral BID  . insulin aspart  0-5 Units Subcutaneous QHS  . insulin aspart  0-9 Units Subcutaneous TID WC  . insulin glargine  5 Units Subcutaneous Daily  . lactulose  20 g Oral BID  . metoprolol succinate  50 mg Oral Daily  . naltrexone  25 mg Oral Daily  . pantoprazole  40 mg Oral Daily  . phytonadione  2.5 mg Oral Once  . potassium chloride  40 mEq Oral BID  . rifaximin  400 mg Oral TID  . sodium chloride flush  3 mL Intravenous Q12H    Scheduled Meds: . allopurinol  100 mg Oral Daily  . aspirin EC  81 mg Oral Daily  . atorvastatin  40 mg Oral Daily  . furosemide  160 mg Oral BID  . guaiFENesin  600 mg Oral BID  . insulin aspart  0-5 Units Subcutaneous QHS  . insulin aspart  0-9 Units Subcutaneous TID WC  . insulin glargine  5 Units Subcutaneous Daily  . lactulose  20 g Oral BID  . metoprolol succinate  50 mg Oral Daily  . naltrexone  25 mg Oral Daily  . pantoprazole  40 mg Oral Daily  . phytonadione  2.5 mg Oral Once  . potassium chloride  40 mEq Oral BID  . rifaximin  400 mg Oral TID  . sodium chloride flush  3 mL Intravenous Q12H   Continuous Infusions:  PRN Meds:.acetaminophen **OR** acetaminophen, camphor-menthol, diphenhydrAMINE, hydrOXYzine, ipratropium-albuterol  BP 113/61 (BP Location: Right Arm)   Pulse 89   Temp 97.9 F (36.6 C) (Oral)   Resp 18   Ht 5' 2"  (1.575 m)   Wt 163 lb 1.6 oz (74 kg)   SpO2 100%   BMI 29.83 kg/m    Intake/Output Summary (Last 24 hours) at 04/29/16 1216 Last data filed at 04/29/16 3709  Gross per 24 hour  Intake              300 ml  Output             2800 ml  Net             -2500 ml    Weight change: -1 lb 11.2 oz (-0.771 kg)  EXAM: General: resting in bed Cardiac: irr irr, no rubs, murmurs or gallops Pulm: no crackles heard, moving normal volumes of air Abd: soft, non tender, nondistended, BS present GU: foley Ext: trace pitting edema b/l LE with Reser appearance -> improving    Labs: Basic Metabolic Panel:  Recent Labs Lab 04/24/16 0516  04/26/16 0432 04/27/16 0658 04/28/16 0734 04/29/16 0554  NA 135  < > 133* 136 136 135  K 4.8  < > 4.8 4.3 4.4 4.5  CL 96*  < > 94* 97* 97* 99*  CO2 22  < > 25 26 25 26   GLUCOSE 182*  < > 155* 100* 146* 190*  BUN 81*  < > 84* 85* 82* 78*  CREATININE 3.83*  < > 3.94* 3.82* 3.83* 3.58*  CALCIUM 8.4*  < > 8.4* 8.5* 8.5* 8.4*  PHOS 5.3*  --  4.9* 4.9*  --   --   < > = values in this interval not displayed.  Liver Function Tests:  Recent Labs Lab 04/24/16 0516 04/27/16 0658 04/28/16 0734 04/29/16 0554  AST 29  --  22 19  ALT 16  --  13* 12*  ALKPHOS 280*  --  241* 227*  BILITOT 1.2  --  1.2 0.9  PROT 7.4  --  6.9 6.8  ALBUMIN 2.7*  2.7* 2.6* 2.6* 2.5*   No results for input(s): LIPASE, AMYLASE in the last 168 hours.  Recent Labs Lab 04/26/16 0432 04/28/16 0454 04/29/16 0554  AMMONIA 81* 74* 72*    CBC:  Recent Labs Lab 04/23/16 0655 04/24/16 0516 04/25/16 0431 04/27/16 0658 04/29/16 0554  WBC 5.6 5.3 5.0 5.3 4.5  HGB 9.6* 10.2* 9.8* 9.8* 9.7*  HCT 31.0* 32.7* 30.7* 31.3* 31.4*  MCV 90.9 90.1 87.7 86.7 89.7  PLT 226 251 188 286 254    Cardiac Enzymes: No results for input(s): CKTOTAL, CKMB, CKMBINDEX, TROPONINI in the last 168 hours.  CBG:  Recent Labs Lab 04/28/16 1141 04/28/16 1745 04/28/16 2137 04/29/16 0816 04/29/16 1154  GLUCAP 149* 165* 182* 175* 208*    Iron Studies: No results for input(s): IRON, TIBC, TRANSFERRIN, FERRITIN in the last 72 hours.  ABG    Component Value Date/Time   PHART 7.487 (H) 04/22/2016 1218   PCO2ART 36.6 04/22/2016 1218   PO2ART  80.5 04/22/2016 1218   HCO3 27.4 (H) 04/22/2016 1218   TCO2 28.5 04/22/2016 1218   O2SAT 96.1 04/22/2016 1218      Studies/Results: Dg Chest 2 View  Result Date: 04/28/2016 CLINICAL DATA:  Productive cough today. No shortness of breath or chest pain. EXAM: CHEST  2 VIEW COMPARISON:  04/25/2016 FINDINGS: Stable changes from cardiac surgery. Cardiac silhouette is mildly enlarged. No mediastinal or hilar masses. Small pleural effusions. Streaky opacity at the lung bases most likely atelectasis. No convincing pneumonia or pulmonary edema. No pneumothorax. The bony thorax is demineralized but grossly intact. IMPRESSION: 1. No significant change from the prior study. 2. Cardiomegaly with small effusions but no convincing pulmonary edema. 3. Streaky opacity at the lung bases most likely atelectasis. No convincing pneumonia. Electronically Signed   By: Lajean Manes M.D.   On: 04/28/2016 13:22   Dg Knee 1-2 Views Left  Result Date: 04/27/2016 CLINICAL DATA:  Left lower extremity pain.  No reported injury. EXAM: LEFT KNEE - 1-2 VIEW COMPARISON:  None. FINDINGS: No fracture, significant joint effusion, dislocation or suspicious focal osseous lesion. Small marginal osteophyte at the inferior patella. Extensive vascular calcifications in the posterior soft tissues. Surgical clips in the medial soft tissues. IMPRESSION: No fracture, joint effusion or malalignment. Mild degenerative changes at the inferior patella. Electronically Signed   By: Ilona Sorrel M.D.   On: 04/27/2016 12:46   Dg Lumbar Spine 1 View  Result Date: 04/27/2016 CLINICAL DATA:  Back pain EXAM: LUMBAR SPINE - 1 VIEW COMPARISON:  None FINDINGS: Single AP view of the lumbar spine. No lateral view for correlation. Marked osseous demineralization. Five lumbar vertebra. Facet degenerative changes lower lumbar spine. No gross acute lumbar spine abnormalities identified on limited AP exam. Scattered atherosclerotic calcifications and pelvic  phleboliths. Surgical clips RIGHT upper quadrant question cholecystectomy. IMPRESSION: Osseous demineralization with degenerative facet disease changes lower lumbar spine. No gross acute lumbar spine abnormality identified on single AP view. Aortic atherosclerosis. Electronically Signed   By: Lavonia Dana M.D.   On: 04/27/2016 12:47  Dg Hip Unilat With Pelvis 2-3 Views Left  Result Date: 04/27/2016 CLINICAL DATA:  Hip pain EXAM: DG HIP (WITH OR WITHOUT PELVIS) 2-3V LEFT COMPARISON:  None FINDINGS: Osseous demineralization. Hip and SI joint spaces symmetric and preserved. No acute fracture, dislocation, or bone destruction. Extensive atherosclerotic calcification. Calcified pelvic phleboliths. IMPRESSION: No acute osseous abnormalities. Electronically Signed   By: Lavonia Dana M.D.   On: 04/27/2016 12:49   Assessment/Plan: 75 year old female with PMH of CHF (EF 30-35% TTE 04/21/16), CKD Stage IV-V (baseline SCr 2.0-2.5), Atrial Fibrillation, CAD, HTN, IDDM, and Gout who was admitted with acute CHF exacerbation and pruritus  1. AoCKD IV-V - Baseline SCr 2.0-2.7. Up to 3.12 on admission. May ultimately need dialysis for volume control, however no emergent need at this time. She has established with Dr. Hollie Salk as her primary Nephrologist now. Appears near her euvolemic state, have adjusted diuretics with improved volume status and to avoid hypotension in setting of right heart disease.  -SCr 3.94 >> 3.82 >> 3.83 >> 3.58 -Lasix 160 mg po BID -monitor renal function -No ACEI/ARB, avoid nephrotoxins -Renal U/S without hydronephrosis, shows incidental finding of right pleural effusion and ascites -f/u PTH -Vein mapping obtained -f/u with Dr. Hollie Salk outpatient  2. CHF - Has been on Lasix 80 mg po BID and Metolazone 5 mg daily outpatient.  -Lasix 160 mg po BID -Discontinued Metolazone -Continue potassium supplement -Monitor BPs with diuresis -Strict I/Os -Daily standing weights  3.  Encephalopathy/?Uremia - Confusion last few days prior to admission, pruritus, isolated elevated Alkaline phosphatase with normal bilirubin. Abdominal U/S shows hx of cholecystectomy and echogenic portal venous walls. No asterixis on exam. -GI following, plan for liver bx when INR reaches 1.5  4. Pruritus: Alk phos elevated with normal bili. ? Related to biliary disease, s/p cholecystectomy. PBC, PSC workup per primary. AMA negative, ANA 1:80. Possibly medication related?  5. Hypokalemia: Repleted  6. Atrial Fibrillation - Rate controlled. On Warfarin to heparin for anticoagulation.  7. IDDM - Per primary  8. CAD/HTN - On lasix, metoprolol, atorvastatin. Amlodipine d/c'ed to avoid hypotension.  Zada Finders, MD IMTS PGY2  Nephrology will sign off, please call for further assistance.

## 2016-04-29 NOTE — Progress Notes (Signed)
PROGRESS NOTE    Cassandra Bonilla  TTS:[id number] DOB: 1941/09/03 DOA: 04/20/2016 PCP: Marco Collie, MD   Outpatient Specialists: Delsa Grana)- nephrology    Brief Narrative:  Cassandra Bonilla is a 75 y.o. female with a past medical history significant for Afib not on anticoagulation (due to anemia?), chronic systolic CHF EF 09%, anemia, CKD IV-V, and DM who presents with itching but is found to have hypokalemia again. Per history, the patient was ambulatory and able to climb "13 flights of stairs to our apartment" in [place] as recently as a year ago, but in February had a flare of CHF and worsening of her renal function (previously baseline Cr high 1s, subsequently 2-3), and since then has been more debilitated, walking only with a walker.  Two months ago she moved with her husband from [place], and was admitted with hypokalemia here 5 weeks ago.  Husband states this is the 6th hospitalization this calandar year.  Records from previous hospital reviewed and are in [place]. Echocardiogram in May 2017 in [place]: EF 40-45% Severe PH Moderate to severe TR   Assessment & Plan:   Principal Problem:   Hypokalemia Active Problems:   Anemia of chronic disease   Chronic atrial fibrillation (HCC)   Type 2 diabetes mellitus with stage 4 chronic kidney disease, with long-term current use of insulin (HCC)   Acute on chronic systolic (congestive) heart failure (HCC)   CKD (chronic kidney disease), stage IV (HCC)   Fluid overload   Acute encephalopathy   Acute on chronic renal failure (HCC)   Abnormal alkaline phosphatase test    Acute on Chronic systolic and right sided CHF:   From CKD and right sided and systolic CHF EF 23-30% in May at last echocardiogram in [place].  Pulmonary HTN.   Not on lisinopril because of renal insufficiency.  Echo EF 35 % by echo during this admission -strict I/Os, daily weights -discontinue Norvasc to avoid hypotension  -she received three doses of metolazone, and iv lasix  she is transitioned to oral lasix initially 263m ibd, this is changed to 1626mbid on 8/14, cr seems stabilized, legs starts to wrinkle -cardiology /nephrology following   Acute on  chronic renal insufficiency: CKD IV-V:   Per records cr range 2.7---2.9 Appreciate nephrology Dr PoFlorene Glenelp, diuretics per nephrology,  patient might need dialysis.  Bun 78/cr 3.58 on 8/14  Encephalopathy, acute;  ABG no hypercapnia , elvated Ammonia level/uremia,  Nephrology consulted, evaluation for uremia.  Ct head pending   Elevated alk phos, elevated ammonia level; Pruritus:  USKoreaholecystectomy and underline hepatic inflammatory changes.  Ammonia remain elevated despite lactulose, start rifaximin  Alk phosphatase elevated, pruritus. ANA mildly elevated, mitochondrial antibodies negative.   viral hepatitis panel negative Denies abdominal pain.  Gi consulted, plan to biopsy liver, awaiting INT to be less than 2, need to be npo aftermidnight every night incase able to proceed with liver biopsy , s/p vitk 2.73m47mral on 8/14, repeat inr in am  Atrial fibrillation:  CHADS2-VASc 5.  Was on warfarin but this was stopped at some point because of her anemia.  There was never any bleeding event (melena or hematochezia), just that she was anemic.  --Warfarin with lovenox bridging dosed per pharmacy,  heparin drip when inr less than 2 due to planned liver biopsy -Continue metoprolol  Hypokalemia:  replaced -Mg 1.9   IDDM:  - home glargine held initially, restarted on 8/11 5units qham -Sliding scale corrections, renal dose  Coronary artery disease  and HTN: History of CABG, PCI 2, CEA. -Continue asa, amlodipine, furosemide, metolazone, metorpolol, and statin  Left leg pain, patient is not very specific with the location of the pain, report entire leg hurts, will get lumber /left hip/left knee x ray and venous US to left lower extremity, prn pain meds Unfortunately opioid is not an option for pain  control for now due to she is on naltraxone for pruritis. She is not a candidate for NSAIDs due to renal impairment, only option for pain is tylenol for now, may consider iv tylenol if oral tylenol not effective Iv tylenol order is rejected by pharmacy to due " equal efficacy for pain control when compared to oral tylenol"  Will try flexeril for now, will discuss with Gi if naltraxone can be stopped, so opioid can be used for pain control if her pain persist  Addendum: x ray no acute findings, pain has resolved   cough and wheezing: h/o copd, start scheduled nebs, continue mucinex, will not start steroids prior to liver biopsy, cxy two view no acute findings, symptom resolved with nebs  DVT prophylaxis:  Lovenox to coumadin Heparin drip when INR less than 2  Code Status: Full Code   Family Communication: Husband not at bedside  Disposition Plan:  Remain inpatient. Liver biopsy early next week, eventually may need SNF  Consultants:   Cardiology  Nephrology   GI  Procedures:   none  Subjective:  Cough and wheezing developed yesterday on 8/13 has resolved, she is on room air, not in respiratory distress She remain drowsy, drift back to sleep during conversation, she knows she is in the hospital at [facility], she is not oriented to time  Urine output 2.6 liter last 24hrs, cr seems stablized, alk phos slightly trending down? Ammonia remain mildly elevated INR remain above 2 after holding coumadin for two days  Her legs start to wrinkle     Objective: Vitals:   04/28/16 1451 04/28/16 2138 04/28/16 2327 04/29/16 0450  BP: 120/61 (!) 145/59  113/61  Pulse: 78 93  89  Resp: _0 Temp: 98.5 F (36.9 C) 98.6 F (37 C)  97.9 F (36.6 C)  TempSrc: Oral Oral  Oral  SpO2: 100% 97% 97% 100%  Weight:    74 kg (163 lb 1.6 oz)  Height:        Intake/Output Summary (Last 24 hours) at 04/29/16 0827 Last data filed at 04/29/16 0012  Gross per 24 hour  Intake               450 ml  Output             2600 ml  Net            -2150 ml   Filed Weights   04/27/16 0229 04/28/16 0439 04/29/16 0450  Weight: 81.3 kg (179 lb 3.7 oz) 74.8 kg (164 lb 12.8 oz) 74 kg (163 lb 1.6 oz)    Examination:  General exam: sleepy with confusion, follow commands  Respiratory system: Crackles, wheezing, rhonchi previously heard has resolved Cardiovascular system: S1 & S2 heard, RRR. No JVD, murmurs, rubs, gallops or clicks- edema to thighs Gastrointestinal system: Abdomen is nondistended, soft and nontender. No organomegaly or masses felt. Normal bowel sounds heard. Extremities: moves all 4 ext, decreasing edema bilateral lower extremities, legs start to wrinkle     Data Reviewed: I have personally reviewed following labs and imaging studies  CBC:  Recent Labs Lab 04/23/16  2993 04/24/16 0516 04/25/16 0431 04/27/16 0658 04/29/16 0554  WBC 5.6 5.3 5.0 5.3 4.5  HGB 9.6* 10.2* 9.8* 9.8* 9.7*  HCT 31.0* 32.7* 30.7* 31.3* 31.4*  MCV 90.9 90.1 87.7 86.7 89.7  PLT 226 251 188 286 716   Basic Metabolic Panel:  Recent Labs Lab 04/23/16 0655 04/24/16 0516 04/25/16 0741 04/26/16 0432 04/27/16 0658 04/28/16 0734 04/29/16 0554  NA 135 135 135 133* 136 136 135  K 3.9 4.8 4.7 4.8 4.3 4.4 4.5  CL 93* 96* 96* 94* 97* 97* 99*  CO2 _0 GLUCOSE 115* 182* 201* 155* 100* 146* 190*  BUN 76* 81* 81* 84* 85* 82* 78*  CREATININE 3.68* 3.83* 3.86* 3.94* 3.82* 3.83* 3.58*  CALCIUM 8.3* 8.4* 8.1* 8.4* 8.5* 8.5* 8.4*  PHOS 5.0* 5.3*  --  4.9* 4.9*  --   --    GFR: Estimated Creatinine Clearance: 13 mL/min (by C-G formula based on SCr of 3.58 mg/dL). Liver Function Tests:  Recent Labs Lab 04/22/16 1152 04/23/16 0655 04/24/16 0516 04/27/16 0658 04/28/16 0734 04/29/16 0554  AST 32 27 29  --  22 19  ALT _1 --  13* 12*  ALKPHOS 269* 256* 280*  --  241* 227*  BILITOT 1.1 1.3* 1.2  --  1.2 0.9  PROT 7.2 6.7 7.4  --  6.9 6.8  ALBUMIN 2.8* 2.5*  2.7*  2.7* 2.6* 2.6* 2.5*   No results for input(s): LIPASE, AMYLASE in the last 168 hours.  Recent Labs Lab 04/24/16 1642 04/25/16 0741 04/26/16 0432 04/28/16 0454 04/29/16 0554  AMMONIA 47* 48* 81* 74* 72*   Coagulation Profile:  Recent Labs Lab 04/25/16 0431 04/26/16 0432 04/27/16 0658 04/28/16 0734 04/29/16 0554  INR 2.04 2.32 2.44 2.35 2.18   Cardiac Enzymes: No results for input(s): CKTOTAL, CKMB, CKMBINDEX, TROPONINI in the last 168 hours. BNP (last 3 results) No results for input(s): PROBNP in the last 8760 hours. HbA1C: No results for input(s): HGBA1C in the last 72 hours. CBG:  Recent Labs Lab 04/28/16 0742 04/28/16 1141 04/28/16 1745 04/28/16 2137 04/29/16 0816  GLUCAP 150* 149* 165* 182* 175*   Lipid Profile: No results for input(s): CHOL, HDL, LDLCALC, TRIG, CHOLHDL, LDLDIRECT in the last 72 hours. Thyroid Function Tests: No results for input(s): TSH, T4TOTAL, FREET4, T3FREE, THYROIDAB in the last 72 hours. Anemia Panel: No results for input(s): VITAMINB12, FOLATE, FERRITIN, TIBC, IRON, RETICCTPCT in the last 72 hours. Urine analysis:    Component Value Date/Time   COLORURINE YELLOW 04/23/2016 0015   APPEARANCEUR CLOUDY (A) 04/23/2016 0015   LABSPEC 1.009 04/23/2016 0015   PHURINE 7.0 04/23/2016 0015   GLUCOSEU NEGATIVE 04/23/2016 0015   HGBUR NEGATIVE 04/23/2016 0015   BILIRUBINUR NEGATIVE 04/23/2016 0015   KETONESUR NEGATIVE 04/23/2016 0015   PROTEINUR 30 (A) 04/23/2016 0015   NITRITE NEGATIVE 04/23/2016 0015   LEUKOCYTESUR NEGATIVE 04/23/2016 0015     ) Recent Results (from the past 240 hour(s))  MRSA PCR Screening     Status: None   Collection Time: 04/28/16  8:35 PM  Result Value Ref Range Status   MRSA by PCR NEGATIVE NEGATIVE Final    Comment:        The GeneXpert MRSA Assay (FDA approved for NASAL specimens only), is one component of a comprehensive MRSA colonization surveillance program. It is not intended to diagnose  MRSA infection nor to guide or monitor treatment for MRSA infections.  Anti-infectives    Start     Dose/Rate Route Frequency Ordered Stop   04/26/16 1600  rifaximin (XIFAXAN) tablet 400 mg     400 mg Oral 3 times daily 04/26/16 1200     04/26/16 1000  rifaximin (XIFAXAN) tablet 200 mg  Status:  Discontinued     200 mg Oral 3 times daily 04/26/16 6948 04/26/16 1200       Radiology Studies: Dg Chest 2 View  Result Date: 04/28/2016 CLINICAL DATA:  Productive cough today. No shortness of breath or chest pain. EXAM: CHEST  2 VIEW COMPARISON:  04/25/2016 FINDINGS: Stable changes from cardiac surgery. Cardiac silhouette is mildly enlarged. No mediastinal or hilar masses. Small pleural effusions. Streaky opacity at the lung bases most likely atelectasis. No convincing pneumonia or pulmonary edema. No pneumothorax. The bony thorax is demineralized but grossly intact. IMPRESSION: 1. No significant change from the prior study. 2. Cardiomegaly with small effusions but no convincing pulmonary edema. 3. Streaky opacity at the lung bases most likely atelectasis. No convincing pneumonia. Electronically Signed   By: Lajean Manes M.D.   On: 04/28/2016 13:22   Dg Knee 1-2 Views Left  Result Date: 04/27/2016 CLINICAL DATA:  Left lower extremity pain.  No reported injury. EXAM: LEFT KNEE - 1-2 VIEW COMPARISON:  None. FINDINGS: No fracture, significant joint effusion, dislocation or suspicious focal osseous lesion. Small marginal osteophyte at the inferior patella. Extensive vascular calcifications in the posterior soft tissues. Surgical clips in the medial soft tissues. IMPRESSION: No fracture, joint effusion or malalignment. Mild degenerative changes at the inferior patella. Electronically Signed   By: Ilona Sorrel M.D.   On: 04/27/2016 12:46   Dg Lumbar Spine 1 View  Result Date: 04/27/2016 CLINICAL DATA:  Back pain EXAM: LUMBAR SPINE - 1 VIEW COMPARISON:  None FINDINGS: Single AP view of the lumbar  spine. No lateral view for correlation. Marked osseous demineralization. Five lumbar vertebra. Facet degenerative changes lower lumbar spine. No gross acute lumbar spine abnormalities identified on limited AP exam. Scattered atherosclerotic calcifications and pelvic phleboliths. Surgical clips RIGHT upper quadrant question cholecystectomy. IMPRESSION: Osseous demineralization with degenerative facet disease changes lower lumbar spine. No gross acute lumbar spine abnormality identified on single AP view. Aortic atherosclerosis. Electronically Signed   By: Lavonia Dana M.D.   On: 04/27/2016 12:47   Dg Hip Unilat With Pelvis 2-3 Views Left  Result Date: 04/27/2016 CLINICAL DATA:  Hip pain EXAM: DG HIP (WITH OR WITHOUT PELVIS) 2-3V LEFT COMPARISON:  None FINDINGS: Osseous demineralization. Hip and SI joint spaces symmetric and preserved. No acute fracture, dislocation, or bone destruction. Extensive atherosclerotic calcification. Calcified pelvic phleboliths. IMPRESSION: No acute osseous abnormalities. Electronically Signed   By: Lavonia Dana M.D.   On: 04/27/2016 12:49        Scheduled Meds: . allopurinol  100 mg Oral Daily  . aspirin EC  81 mg Oral Daily  . atorvastatin  40 mg Oral Daily  . furosemide  160 mg Oral BID  . guaiFENesin  600 mg Oral BID  . insulin aspart  0-5 Units Subcutaneous QHS  . insulin aspart  0-9 Units Subcutaneous TID WC  . insulin glargine  5 Units Subcutaneous Daily  . lactulose  20 g Oral BID  . metoprolol succinate  50 mg Oral Daily  . naltrexone  25 mg Oral Daily  . pantoprazole  40 mg Oral Daily  . potassium chloride  40 mEq Oral BID  . rifaximin  400 mg Oral TID  .  sodium chloride flush  3 mL Intravenous Q12H   Continuous Infusions:    LOS: 7 days    Time spent: 35 min    Rozelia Catapano, MD PhD [facility] Pager [phone]  If 7PM-7AM, please contact night-coverage [email or web address] Password [facility] 04/29/2016, 8:27 AM

## 2016-04-29 NOTE — Care Management Important Message (Signed)
Important Message  Patient Details  Name: Cassandra Bonilla MRN: [id number] Date of Birth: 1941/01/15   Medicare Important Message Given:  Yes    [name] May 04/29/2016, 10:52 AM

## 2016-04-29 NOTE — NC FL2 (Signed)
[place] LEVEL OF CARE SCREENING TOOL     IDENTIFICATION  Patient Name: Cassandra Bonilla Birthdate: 1941/07/13 Sex: female Admission Date (Current Location): 04/20/2016  [facility] and [place] Number:  [employer] and Address:  [facility] [place]. [facility], [place] [address] [place], [place] [address]      Provider Number: [id number]  Attending Physician Name and Address:  Florencia Reasons, MD  Relative Name and Phone Number:       Current Level of Care: Hospital Recommended Level of Care: [place] Prior Approval Number:    Date Approved/Denied:   PASRR Number: [id number]  Discharge Plan: SNF    Current Diagnoses: Patient Active Problem List   Diagnosis Date Noted  . Abnormal alkaline phosphatase test   . Acute encephalopathy 04/22/2016  . Acute on chronic renal failure ([place]) 04/22/2016  . Fluid overload 04/20/2016  . Hypokalemia 03/22/2016  . Pruritus 03/22/2016  . Anemia of chronic disease 03/22/2016  . Chronic atrial fibrillation ([place]) 03/22/2016  . Type 2 diabetes mellitus with stage 4 chronic kidney disease, with long-term current use of insulin ([place]) 03/22/2016  . Acute on chronic systolic (congestive) heart failure ([place]) 03/22/2016  . CKD (chronic kidney disease), stage IV ([place]) 03/22/2016    Orientation RESPIRATION BLADDER Height & Weight     Self, Place, Situation  Normal Indwelling catheter Weight: 74 kg (163 lb 1.6 oz) Height:  5\' 2"  (157.5 cm)  BEHAVIORAL SYMPTOMS/MOOD NEUROLOGICAL BOWEL NUTRITION STATUS      Continent Diet  AMBULATORY STATUS COMMUNICATION OF NEEDS Skin   Extensive Assist Verbally Normal                       Personal Care Assistance Level of Assistance  Bathing, Dressing Bathing Assistance: Limited assistance   Dressing Assistance: Limited assistance     Functional Limitations Info             SPECIAL CARE FACTORS FREQUENCY  PT (By licensed PT), OT (By licensed OT)     PT Frequency:  5/wk OT Frequency: 5/wk            Contractures      Additional Factors Info  Code Status, Allergies, Insulin Sliding Scale Code Status Info: FULL Allergies Info: Contrast Media  Iodinated Diagnostic Agents, Prednisone   Insulin Sliding Scale Info: 4/day       Current Medications (04/29/2016):  This is the current hospital active medication list Current Facility-Administered Medications  Medication Dose Route Frequency Provider Last Rate Last Dose  . acetaminophen (TYLENOL) tablet 650 mg  650 mg Oral Q6H PRN Edwin Dada, MD   650 mg at 04/28/16 2319   Or  . acetaminophen (TYLENOL) suppository 650 mg  650 mg Rectal Q6H PRN Edwin Dada, MD      . allopurinol (ZYLOPRIM) tablet 100 mg  100 mg Oral Daily Edwin Dada, MD   100 mg at 04/29/16 0949  . aspirin EC tablet 81 mg  81 mg Oral Daily Edwin Dada, MD   81 mg at 04/29/16 0949  . atorvastatin (LIPITOR) tablet 40 mg  40 mg Oral Daily Edwin Dada, MD   40 mg at 04/29/16 0949  . camphor-menthol (SARNA) lotion   Topical PRN Zada Finders, MD      . diphenhydrAMINE (BENADRYL) injection 12.5 mg  12.5 mg Intravenous Q8H PRN Florencia Reasons, MD   12.5 mg at 04/28/16 0302  . furosemide (LASIX) tablet 160  mg  160 mg Oral BID Zada Finders, MD   160 mg at 04/29/16 0949  . guaiFENesin (MUCINEX) 12 hr tablet 600 mg  600 mg Oral BID Florencia Reasons, MD   600 mg at 04/29/16 0949  . hydrOXYzine (ATARAX/VISTARIL) tablet 25-50 mg  25-50 mg Oral Q6H PRN Edwin Dada, MD   50 mg at 04/28/16 2319  . insulin aspart (novoLOG) injection 0-5 Units  0-5 Units Subcutaneous QHS Edwin Dada, MD   2 Units at 04/26/16 2348  . insulin aspart (novoLOG) injection 0-9 Units  0-9 Units Subcutaneous TID WC Edwin Dada, MD   2 Units at 04/29/16 0950  . insulin glargine (LANTUS) injection 5 Units  5 Units Subcutaneous Daily Florencia Reasons, MD   5 Units at 04/29/16 0950  . ipratropium-albuterol (DUONEB) 0.5-2.5 (3)  MG/3ML nebulizer solution 3 mL  3 mL Nebulization Q4H PRN Florencia Reasons, MD   3 mL at 04/28/16 2325  . lactulose (CHRONULAC) 10 GM/15ML solution 20 g  20 g Oral BID Belkys A Regalado, MD   20 g at 04/29/16 0947  . metoprolol succinate (TOPROL-XL) 24 hr tablet 50 mg  50 mg Oral Daily Herminio Commons, MD   50 mg at 04/29/16 0949  . naltrexone (DEPADE) tablet 25 mg  25 mg Oral Daily Gatha Mayer, MD   25 mg at 04/29/16 0949  . pantoprazole (PROTONIX) EC tablet 40 mg  40 mg Oral Daily Vena Rua, PA-C   40 mg at 04/29/16 0949  . phytonadione (VITAMIN K) tablet 2.5 mg  2.5 mg Oral Once Florencia Reasons, MD      . potassium chloride SA (K-DUR,KLOR-CON) CR tablet 40 mEq  40 mEq Oral BID Geradine Girt, DO   40 mEq at 04/29/16 0949  . rifaximin (XIFAXAN) tablet 400 mg  400 mg Oral TID Florencia Reasons, MD   400 mg at 04/29/16 0949  . sodium chloride flush (NS) 0.9 % injection 3 mL  3 mL Intravenous Q12H Edwin Dada, MD   3 mL at 04/28/16 2249     Discharge Medications: Please see discharge summary for a list of discharge medications.  Relevant Imaging Results:  Relevant Lab Results:   Additional Information SS#: [id number]  Jorge Ny, LCSW

## 2016-04-29 NOTE — Clinical Social Work Note (Signed)
Clinical Social Work Assessment  Patient Details  Name: Cassandra Bonilla MRN: [id number] Date of Birth: Feb 04, 1941  Date of referral:  04/29/16               Reason for consult:  Facility Placement                Permission sought to share information with:  Family Supports Permission granted to share information::  Yes, Verbal Permission Granted  Name::     Veterinary surgeon::  SNFs  Relationship::  spouse  Contact Information:     Housing/Transportation Living arrangements for the past 2 months:  Apartment Source of Information:  Patient, Spouse Patient Interpreter Needed:  None Criminal Activity/Legal Involvement Pertinent to Current Situation/Hospitalization:  No - Comment as needed Significant Relationships:  Adult Children, Spouse Lives with:  Adult Children, Spouse Do you feel safe going back to the place where you live?  No Need for family participation in patient care:  No (Coment)  Care giving concerns: pt lives at home with husband and dtr- they are unable to care for patient given current level of impairment   Facilities manager / plan:  CSW spoke with pt and pt husband concerning PT recommendation for SNF stay prior to return home.  Pt and husband acknowledge need for rehab and state pt was recently in SNF after hospital admission.  Employment status:  Retired Nurse, adult PT Recommendations:    Information / Referral to community resources:  [place]  Patient/Family's Response to care:  Agreeable to SNF stay for rehab but want a different facility than last time  Patient/Family's Understanding of and Emotional Response to Diagnosis, Current Treatment, and Prognosis:  No questions or concerns at this time- pt is motivated to get stronger and be able to return home.  Emotional Assessment Appearance:  Appears stated age Attitude/Demeanor/Rapport:  Sedated Affect (typically observed):  Appropriate Orientation:  Oriented  to Self, Oriented to Place, Oriented to Situation Alcohol / Substance use:  Not Applicable Psych involvement (Current and /or in the community):  No (Comment)  Discharge Needs  Concerns to be addressed:  Care Coordination Readmission within the last 30 days:  Yes Current discharge risk:  Physical Impairment Barriers to Discharge:  Continued Medical Work up   Jorge Ny, LCSW 04/29/2016, 1:36 PM

## 2016-04-30 ENCOUNTER — Inpatient Hospital Stay (HOSPITAL_COMMUNITY): Payer: Medicare Other

## 2016-04-30 LAB — GLUCOSE, CAPILLARY
GLUCOSE-CAPILLARY: 113 mg/dL — AB (ref 65–99)
GLUCOSE-CAPILLARY: 161 mg/dL — AB (ref 65–99)
GLUCOSE-CAPILLARY: 154 mg/dL — AB (ref 65–99)
GLUCOSE-CAPILLARY: 153 mg/dL — AB (ref 65–99)

## 2016-04-30 LAB — PROTIME-INR
INR: 1.6
PROTHROMBIN TIME: 19.2 s — AB (ref 11.4–15.2)

## 2016-04-30 LAB — HEPARIN LEVEL (UNFRACTIONATED)
Heparin Unfractionated: 0.35 IU/mL (ref 0.30–0.70)
Heparin Unfractionated: 0.41 IU/mL (ref 0.30–0.70)

## 2016-04-30 LAB — COMPREHENSIVE METABOLIC PANEL
ALBUMIN: 2.7 g/dL — AB (ref 3.5–5.0)
ALK PHOS: 238 U/L — AB (ref 38–126)
ALT: 14 U/L (ref 14–54)
ANION GAP: 12 (ref 5–15)
AST: 23 U/L (ref 15–41)
BUN: 71 mg/dL — ABNORMAL HIGH (ref 6–20)
CO2: 28 mmol/L (ref 22–32)
Calcium: 8.6 mg/dL — ABNORMAL LOW (ref 8.9–10.3)
Chloride: 98 mmol/L — ABNORMAL LOW (ref 101–111)
Creatinine, Ser: 3.42 mg/dL — ABNORMAL HIGH (ref 0.44–1.00)
GFR calc Af Amer: 14 mL/min — ABNORMAL LOW (ref >60)
GFR calc non Af Amer: 12 mL/min — ABNORMAL LOW (ref >60)
GLUCOSE: 99 mg/dL (ref 65–99)
POTASSIUM: 4.2 mmol/L (ref 3.5–5.1)
SODIUM: 138 mmol/L (ref 135–145)
Total Bilirubin: 1.1 mg/dL (ref 0.3–1.2)
Total Protein: 7.2 g/dL (ref 6.5–8.1)

## 2016-04-30 LAB — AMMONIA: Ammonia: 46 umol/L — ABNORMAL HIGH (ref 9–35)

## 2016-04-30 MED ORDER — MORPHINE SULFATE (PF) 2 MG/ML IV SOLN
1.0000 mg | Freq: Once | INTRAVENOUS | Status: AC
  Administered 2016-04-30: 1 mg via INTRAVENOUS
  Filled 2016-04-30: qty 1

## 2016-04-30 MED ORDER — CYCLOBENZAPRINE HCL 5 MG PO TABS
5.0000 mg | ORAL_TABLET | Freq: Three times a day (TID) | ORAL | Status: DC | PRN
  Administered 2016-04-30 – 2016-05-01 (×2): 5 mg via ORAL
  Filled 2016-04-30 (×2): qty 1

## 2016-04-30 MED ORDER — CYCLOBENZAPRINE HCL 5 MG PO TABS
5.0000 mg | ORAL_TABLET | Freq: Once | ORAL | Status: AC
Start: 2016-04-30 — End: 2016-04-30
  Administered 2016-04-30: 5 mg via ORAL
  Filled 2016-04-30: qty 1

## 2016-04-30 MED ORDER — HYDROCODONE-ACETAMINOPHEN 5-325 MG PO TABS: 1.0000 | ORAL_TABLET | Freq: Once | ORAL | Status: DC

## 2016-04-30 MED ORDER — HEPARIN (PORCINE) IN NACL 100-0.45 UNIT/ML-% IJ SOLN
900.0000 [IU]/h | INTRAMUSCULAR | Status: DC
  Administered 2016-04-30 (×2): 900 [IU]/h via INTRAVENOUS
  Filled 2016-04-30: qty 250

## 2016-04-30 MED ORDER — GABAPENTIN 100 MG PO CAPS
100.0000 mg | ORAL_CAPSULE | Freq: Every day | ORAL | Status: DC
  Administered 2016-04-30 – 2016-05-01 (×2): 100 mg via ORAL
  Filled 2016-04-30 (×2): qty 1

## 2016-04-30 NOTE — Progress Notes (Signed)
Physical Therapy Treatment Patient Details Name: Cassandra Bonilla MRN: [id number] DOB: 04-29-1941 Today's Date: 04/30/2016    History of Present Illness Very pleasant 75 yo female with onset of CHF and hypokalemia, on coumadin and home with husband.  Had been to SNF and then home about 2 weeks, now back to hospital.  PMHx:  CHF, CKD, COPD, CAD, DM, gout, a-fib    PT Comments    Patient limited by c/o itching and feeling of biting "everywhere". Current plan remains appropriate.   Follow Up Recommendations  Supervision for mobility/OOB;SNF     Equipment Recommendations  None recommended by PT    Recommendations for Other Services       Precautions / Restrictions Precautions Precautions: Fall Restrictions Weight Bearing Restrictions: No    Mobility  Bed Mobility               General bed mobility comments: On BSC with NT upon arrival  Transfers Overall transfer level: Needs assistance Equipment used: Rolling walker (2 wheeled) Transfers: Sit to/from [facility] Sit to Stand: Min guard Stand pivot transfers: Min assist       General transfer comment: pt required increased time to stand from recliner to begin ambulating due to preoccupation with her ears feeling "goopy"; min guard sit to stand X 2 from [facility] and recliner; min A for stand pivot with assist to manage RW; cues for hand placement and proximity of RW  Ambulation/Gait Ambulation/Gait assistance: Min assist;Max assist Ambulation Distance (Feet):  (6X2) Assistive device: Rolling walker (2 wheeled) Gait Pattern/deviations: Step-through pattern;Decreased stride length;Trunk flexed Gait velocity: very slow   General Gait Details: assist to manage RW and for balance; cues for safety; pt able to ambulate ~16ft and began closing eyes and then c/o itching and biting and reported she would scream if she didn't sit down; pt began leaning heavily posteriorly and then took hands off of RW and leaned onto  therapist; max cues for placing hands on RW and using RW safely; pt able to correct postural changes with cues; encouraged pt to attempt further ambulation and pt declined and ambulated back to recliner where she then leaned onto therapist again and with mostly unintelligible speech continued to c/o feeling of biting "everywhere"; pt positioned in recliner and then fell asleep; husband present for session   Stairs            Wheelchair Mobility    Modified Rankin (Stroke Patients Only)       Balance     Sitting balance-Leahy Scale: Fair     Standing balance support: Bilateral upper extremity supported Standing balance-Leahy Scale: Poor                      Cognition Arousal/Alertness: Awake/alert Behavior During Therapy: WFL for tasks assessed/performed Overall Cognitive Status: Within Functional Limits for tasks assessed                      Exercises      General Comments        Pertinent Vitals/Pain Pain Assessment: Faces Faces Pain Scale: Hurts even more Pain Location: "everywhere" then back and arms Pain Descriptors / Indicators: Other (Comment);Moaning;Grimacing (itching; biting) Pain Intervention(s): Monitored during session    Home Living                      Prior Function            PT Goals (current  goals can now be found in the care plan section) Acute Rehab PT Goals Patient Stated Goal: get better PT Goal Formulation: With patient/family Time For Goal Achievement: 05/06/16 Potential to Achieve Goals: Good Progress towards PT goals: Not progressing toward goals - comment    Frequency  Min 3X/week    PT Plan Current plan remains appropriate    Co-evaluation             End of Session Equipment Utilized During Treatment: Gait belt Activity Tolerance: Patient limited by pain;Other (comment) (itching and biting) Patient left: in chair;with call bell/phone within reach;with family/visitor present      Time: 1053-1130 PT Time Calculation (min) (ACUTE ONLY): 37 min  Charges:  $Gait Training: 8-22 mins $Therapeutic Activity: 8-22 mins                    G Codes:      Salina April, PTA Pager: [phone]   04/30/2016, 11:55 AM

## 2016-04-30 NOTE — Progress Notes (Signed)
ANTICOAGULATION CONSULT NOTE - Follow Up Consult  Pharmacy Consult for IV Heparin Indication: atrial fibrillation  Allergies  Allergen Reactions  . Contrast Media  [Iodinated Diagnostic Agents] Hives  . Prednisone Other (See Comments)    Confusion    Patient Measurements: Height: 5\' 2"  (157.5 cm) Weight: 152 lb 8.9 oz (69.2 kg) IBW/kg (Calculated) : 50.1 Heparin Dosing Weight: 68 kg  Vital Signs: Temp: 98.3 F (36.8 C) (08/15 2145) Temp Source: Oral (08/15 1330) BP: 130/73 (08/15 2145) Pulse Rate: 96 (08/15 2145)  Labs:  Recent Labs  04/28/16 0734 04/29/16 0554 04/30/16 0524 04/30/16 1554 04/30/16 2135  HGB  --  9.7*  --   --   --   HCT  --  31.4*  --   --   --   PLT  --  254  --   --   --   LABPROT 26.1* 24.6* 19.2*  --   --   INR 2.35 2.18 1.60  --   --   HEPARINUNFRC  --   --   --  0.35 0.41  CREATININE 3.83* 3.58* 3.42*  --   --     Estimated Creatinine Clearance: 13.1 mL/min (by C-G formula based on SCr of 3.42 mg/dL).  Assessment: 75 year old female with hx atrial fibrillation on chronic warfarin prior to admission now on hold for planned liver biopsy - heparin bridge started 8/15 AM.    Heparin level therapeutic on 900 units/hr x2- most recent level 0.41 units/mL.  No bleeding noted.  Goal of Therapy:  Heparin level 0.3-0.7 units/ml Monitor platelets by anticoagulation protocol: Yes   Plan:  -Continue heparin at 900 units/hr  -Daily HL,CBC, INR -F/U plans for warfarin restart -Monitor s/sx bleeding  Hurschel Paynter D. Brettney Ficken, PharmD, BCPS Clinical Pharmacist Pager: [phone] 04/30/2016 10:22 PM

## 2016-04-30 NOTE — Progress Notes (Addendum)
PROGRESS NOTE    Cassandra Bonilla  QIH:[id number] DOB: [birth year] DOA: 04/20/2016 PCP: Marco Collie, MD   Outpatient Specialists: Delsa Grana)- nephrology    Brief Narrative:  Cassandra Bonilla is a 75 y.o. female with a past medical history significant for Afib not on anticoagulation (due to anemia?), chronic systolic CHF EF 87%, anemia, CKD IV-V, and DM who presents with itching but is found to have hypokalemia again. Per history, the patient was ambulatory and able to climb "13 flights of stairs to our apartment" in [place] as recently as a year ago, but in February had a flare of CHF and worsening of her renal function (previously baseline Cr high 1s, subsequently 2-3), and since then has been more debilitated, walking only with a walker.  Two months ago she moved with her husband from [place], and was admitted with hypokalemia here 5 weeks ago.  Husband states this is the 6th hospitalization this calandar year.  Records from previous hospital reviewed and are in [place]. Echocardiogram in May 2017 in [place]: EF 40-45% Severe PH Moderate to severe TR  CKD IV and systolic CHF EF 35 %.  Here for confusion, fluid overload, pruritus. Elevated ammonia level with liver inflammatory changes, Cardio/ renal /GI following. Vein mapping for dialysis access, live biopsy on 8/16, currently on heparin drip  Assessment & Plan:   Principal Problem:   Hypokalemia Active Problems:   Anemia of chronic disease   Chronic atrial fibrillation (HCC)   Type 2 diabetes mellitus with stage 4 chronic kidney disease, with long-term current use of insulin (HCC)   Acute on chronic systolic (congestive) heart failure (HCC)   CKD (chronic kidney disease), stage IV (HCC)   Fluid overload   Acute encephalopathy   Acute on chronic renal failure (HCC)   Abnormal alkaline phosphatase test    Acute on Chronic systolic and right sided CHF:   From CKD and right sided and systolic CHF EF 56-43% in May at last echocardiogram in [place].  Pulmonary HTN.   Not on lisinopril because of renal insufficiency.  Echo EF 35 % by echo during this admission -strict I/Os, daily weights -discontinue Norvasc to avoid hypotension  -she received three doses of metolazone, and iv lasix she is transitioned to oral lasix initially '240mg'$  ibd, this is changed to '160mg'$  bid on 8/14, cr seems stabilized, legs starts to wrinkle -cardiology /nephrology following   Acute on  chronic renal insufficiency: CKD IV-V:   Per records cr range 2.7---2.9 Appreciate nephrology Dr Florene Glen help, diuretics per nephrology,  patient might need dialysis.  Bun/cr 78/3.58 on 8/14 Bun/cr 71/3.42 on 8/15 Foley placed for strict intake and output , consider d/c foley soon  Encephalopathy, acute;  ABG no hypercapnia , elvated Ammonia level/uremia,  Nephrology consulted, evaluation for uremia.  Ct head no acute intracranial abnormality, + Partially opacified right-sided mastoid air cells with air-fluid level, potentially representing mastoiditis.Right middle ear effusion. Though patient denies ear pain, no ear drainage, no fever, no leukocytosis, observe for now  Elevated alk phos, elevated ammonia level; Pruritus:  [name] cholecystectomy and underline hepatic inflammatory changes.  Ammonia remain elevated despite lactulose, start rifaximin  Alk phosphatase elevated, pruritus. ANA mildly elevated, mitochondrial antibodies negative.   viral hepatitis panel negative Denies abdominal pain.  Gi consulted, plan to biopsy liver, awaiting INT to be less than 2, need to be npo aftermidnight every night incase able to proceed with liver biopsy , s/p vitk 2.'5mg'$  oral on 8/14,  inr 1.6 on 8/15,  start heparin drip, IR will biopsy on 8/16, npo after midnight   Atrial fibrillation:  CHADS2-VASc 5.  Was on warfarin but this was stopped at some point because of her anemia.  There was never any bleeding event (melena or hematochezia), just that she was anemic.  --Warfarin with  lovenox bridging dosed per pharmacy,  heparin drip when inr less than 2 due to planned liver biopsy -Continue metoprolol  Hypokalemia:  replaced -Mg 1.9   IDDM:  - home glargine held initially, restarted on 8/11 5units qham -Sliding scale corrections, renal dose  Coronary artery disease and HTN: History of CABG, PCI 2, CEA. -Continue asa, amlodipine, furosemide, metolazone, metorpolol, and statin  Left leg pain, left shoulder pain ,  patient is not very specific with the location of the pain, report entire leg hurts, lumber /left hip/left knee x ray/ left shoulder x ray unremarkable, Unfortunately opioid is not an option for pain control while she is on  naltraxone for pruritis. She is not a candidate for NSAIDs due to renal impairment, only option for pain is tylenol for now,   Will try flexeril prn , add neurontin qhs I have discussed with gi  Dr Lorayne Bender who oked to d/c naltraxone, now she can have percocet prn for pain if flexeril/neurontin not effective   cough and wheezing on 8/13 which has resolved: h/o copd, start scheduled nebs, continue mucinex, will not start steroids prior to liver biopsy, cxy two view no acute findings, symptom resolved with nebs  DVT prophylaxis:  Lovenox to coumadin Heparin drip when INR less than 2  Code Status: Full Code   Family Communication: Husband and daughter at bedside  Disposition Plan:  Remain inpatient. Liver biopsy early next week, eventually may need SNF  Consultants:   Cardiology  Nephrology   GI  Procedures:   none  Subjective:  C/o pain all overall, more in her left shoulder, she is crying due to pain, she denies itching currently, she is oriented to person and place, not to time, she is very sleepy,   husband and daughter in room  Urine output 3.2 liter last 24hrs, cr seems stablized, Ammonia remain mildly elevated, INR 1.6 today after low dose vitk on 8/14  Her legs start to wrinkle      Objective: Vitals:   04/29/16 0450 04/29/16 1354 04/29/16 2055 04/30/16 0515  BP: 113/61 119/79 128/66 (!) 149/68  Pulse: 89 96 98 97  Resp: _0 Temp: 97.9 F (36.6 C)  98.2 F (36.8 C) 97.5 F (36.4 C)  TempSrc: Oral  Oral Oral  SpO2: 100% 99% 99% 100%  Weight: 74 kg (163 lb 1.6 oz)   69.2 kg (152 lb 8.9 oz)  Height:        Intake/Output Summary (Last 24 hours) at 04/30/16 0808 Last data filed at 04/30/16 0600  Gross per 24 hour  Intake              780 ml  Output             3200 ml  Net            -2420 ml   Filed Weights   04/28/16 0439 04/29/16 0450 04/30/16 0515  Weight: 74.8 kg (164 lb 12.8 oz) 74 kg (163 lb 1.6 oz) 69.2 kg (152 lb 8.9 oz)    Examination:  General exam: sleepy with confusion, follow commands  Respiratory system: Crackles, wheezing, rhonchi previously heard has resolved Cardiovascular system: S1 &  S2 heard, RRR. No JVD, murmurs, rubs, gallops or clicks- edema to thighs Gastrointestinal system: Abdomen is nondistended, soft and nontender. No organomegaly or masses felt. Normal bowel sounds heard. Extremities: moves all 4 ext, decreasing edema bilateral lower extremities, legs start to wrinkle     Data Reviewed: I have personally reviewed following labs and imaging studies  CBC:  Recent Labs Lab 04/24/16 0516 04/25/16 0431 04/27/16 0658 04/29/16 0554  WBC 5.3 5.0 5.3 4.5  HGB 10.2* 9.8* 9.8* 9.7*  HCT 32.7* 30.7* 31.3* 31.4*  MCV 90.1 87.7 86.7 89.7  PLT 251 188 286 979   Basic Metabolic Panel:  Recent Labs Lab 04/24/16 0516  04/26/16 0432 04/27/16 0658 04/28/16 0734 04/29/16 0554 04/30/16 0524  NA 135  < > 133* 136 136 135 138  K 4.8  < > 4.8 4.3 4.4 4.5 4.2  CL 96*  < > 94* 97* 97* 99* 98*  CO2 22  < > _0 GLUCOSE 182*  < > 155* 100* 146* 190* 99  BUN 81*  < > 84* 85* 82* 78* 71*  CREATININE 3.83*  < > 3.94* 3.82* 3.83* 3.58* 3.42*  CALCIUM 8.4*  < > 8.4* 8.5* 8.5* 8.4* 8.6*  PHOS 5.3*  --   4.9* 4.9*  --   --   --   < > = values in this interval not displayed. GFR: Estimated Creatinine Clearance: 13.1 mL/min (by C-G formula based on SCr of 3.42 mg/dL). Liver Function Tests:  Recent Labs Lab 04/24/16 0516 04/27/16 0658 04/28/16 0734 04/29/16 0554 04/30/16 0524  AST 29  --  _1 ALT 16  --  13* 12* 14  ALKPHOS 280*  --  241* 227* 238*  BILITOT 1.2  --  1.2 0.9 1.1  PROT 7.4  --  6.9 6.8 7.2  ALBUMIN 2.7*  2.7* 2.6* 2.6* 2.5* 2.7*   No results for input(s): LIPASE, AMYLASE in the last 168 hours.  Recent Labs Lab 04/25/16 0741 04/26/16 0432 04/28/16 0454 04/29/16 0554 04/30/16 0524  AMMONIA 48* 81* 74* 72* 46*   Coagulation Profile:  Recent Labs Lab 04/26/16 0432 04/27/16 0658 04/28/16 0734 04/29/16 0554 04/30/16 0524  INR 2.32 2.44 2.35 2.18 1.60   Cardiac Enzymes: No results for input(s): CKTOTAL, CKMB, CKMBINDEX, TROPONINI in the last 168 hours. BNP (last 3 results) No results for input(s): PROBNP in the last 8760 hours. HbA1C: No results for input(s): HGBA1C in the last 72 hours. CBG:  Recent Labs Lab 04/29/16 0816 04/29/16 1154 04/29/16 1628 04/29/16 2056 04/30/16 0754  GLUCAP 175* 208* 123* 285* 113*   Lipid Profile: No results for input(s): CHOL, HDL, LDLCALC, TRIG, CHOLHDL, LDLDIRECT in the last 72 hours. Thyroid Function Tests: No results for input(s): TSH, T4TOTAL, FREET4, T3FREE, THYROIDAB in the last 72 hours. Anemia Panel: No results for input(s): VITAMINB12, FOLATE, FERRITIN, TIBC, IRON, RETICCTPCT in the last 72 hours. Urine analysis:    Component Value Date/Time   COLORURINE YELLOW 04/23/2016 0015   APPEARANCEUR CLOUDY (A) 04/23/2016 0015   LABSPEC 1.009 04/23/2016 0015   PHURINE 7.0 04/23/2016 0015   GLUCOSEU NEGATIVE 04/23/2016 0015   HGBUR NEGATIVE 04/23/2016 0015   BILIRUBINUR NEGATIVE 04/23/2016 0015   KETONESUR NEGATIVE 04/23/2016 0015   PROTEINUR 30 (A) 04/23/2016 0015   NITRITE NEGATIVE 04/23/2016  0015   LEUKOCYTESUR NEGATIVE 04/23/2016 0015     ) Recent Results (from the past 240 hour(s))  MRSA PCR Screening  Status: None   Collection Time: 04/28/16  8:35 PM  Result Value Ref Range Status   MRSA by PCR NEGATIVE NEGATIVE Final    Comment:        The GeneXpert MRSA Assay (FDA approved for NASAL specimens only), is one component of a comprehensive MRSA colonization surveillance program. It is not intended to diagnose MRSA infection nor to guide or monitor treatment for MRSA infections.       Anti-infectives    Start     Dose/Rate Route Frequency Ordered Stop   04/26/16 1600  rifaximin (XIFAXAN) tablet 400 mg     400 mg Oral 3 times daily 04/26/16 1200     04/26/16 1000  rifaximin (XIFAXAN) tablet 200 mg  Status:  Discontinued     200 mg Oral 3 times daily 04/26/16 4166 04/26/16 1200       Radiology Studies: Dg Chest 2 View  Result Date: 04/28/2016 CLINICAL DATA:  Productive cough today. No shortness of breath or chest pain. EXAM: CHEST  2 VIEW COMPARISON:  04/25/2016 FINDINGS: Stable changes from cardiac surgery. Cardiac silhouette is mildly enlarged. No mediastinal or hilar masses. Small pleural effusions. Streaky opacity at the lung bases most likely atelectasis. No convincing pneumonia or pulmonary edema. No pneumothorax. The bony thorax is demineralized but grossly intact. IMPRESSION: 1. No significant change from the prior study. 2. Cardiomegaly with small effusions but no convincing pulmonary edema. 3. Streaky opacity at the lung bases most likely atelectasis. No convincing pneumonia. Electronically Signed   By: Lajean Manes M.D.   On: 04/28/2016 13:22   Ct Head Wo Contrast  Result Date: 04/29/2016 CLINICAL DATA:  75 year old female with a history of confusion EXAM: CT HEAD WITHOUT CONTRAST TECHNIQUE: Contiguous axial images were obtained from the base of the skull through the vertex without intravenous contrast. COMPARISON:  None. FINDINGS: Unremarkable  appearance of the calvarium without acute fracture or aggressive lesion. Unremarkable appearance of the scalp soft tissues. Unremarkable appearance of the bilateral orbits. Air-fluid level of the right mastoid air cells which are partially opacified. Fluid extends into the middle ear. Fluid density involving the features apex, just anterior to the cochlea. No significant paranasal sinus disease No acute intracranial hemorrhage. No midline shift or mass effect. Unremarkable configuration the ventricles. Gray-white differentiation maintained. Calcifications of the intracranial vasculature. IMPRESSION: No CT evidence of acute intracranial abnormality. Partially opacified right-sided mastoid air cells with air-fluid level, potentially representing mastoiditis. Right middle ear effusion. Intracranial atherosclerosis. Signed, Dulcy Fanny. Earleen Newport, DO [facility] Electronically Signed   By: Corrie Mckusick D.O.   On: 04/29/2016 16:04        Scheduled Meds: . allopurinol  100 mg Oral Daily  . aspirin EC  81 mg Oral Daily  . atorvastatin  40 mg Oral Daily  . cyclobenzaprine  5 mg Oral Once  . furosemide  160 mg Oral BID  . guaiFENesin  600 mg Oral BID  . insulin aspart  0-5 Units Subcutaneous QHS  . insulin aspart  0-9 Units Subcutaneous TID WC  . insulin glargine  5 Units Subcutaneous Daily  . lactulose  20 g Oral BID  . metoprolol succinate  50 mg Oral Daily  . pantoprazole  40 mg Oral Daily  . potassium chloride  40 mEq Oral BID  . rifaximin  400 mg Oral TID  . sodium chloride flush  3 mL Intravenous Q12H   Continuous Infusions: . heparin 900 Units/hr (04/30/16 0725)  LOS: 8 days    Time spent: 77 min    Zinia Innocent, MD PhD [facility] Pager [phone]  If 7PM-7AM, please contact night-coverage [email or web address] Password [email or web address] 04/30/2016, 8:08 AM

## 2016-04-30 NOTE — Progress Notes (Signed)
ANTICOAGULATION CONSULT NOTE - Follow Up Consult  Pharmacy Consult for IV Heparin Indication: atrial fibrillation  Allergies  Allergen Reactions  . Contrast Media  [Iodinated Diagnostic Agents] Hives  . Prednisone Other (See Comments)    Confusion    Patient Measurements: Height: 5\' 2"  (157.5 cm) Weight: 152 lb 8.9 oz (69.2 kg) IBW/kg (Calculated) : 50.1 Heparin Dosing Weight: 68 kg  Vital Signs: Temp: 97.5 F (36.4 C) (08/15 0515) Temp Source: Oral (08/15 0515) BP: 149/68 (08/15 0515) Pulse Rate: 97 (08/15 0515)  Labs:  Recent Labs  04/28/16 0734 04/29/16 0554 04/30/16 0524  HGB  --  9.7*  --   HCT  --  31.4*  --   PLT  --  254  --   LABPROT 26.1* 24.6* 19.2*  INR 2.35 2.18 1.60  CREATININE 3.83* 3.58* 3.42*    Estimated Creatinine Clearance: 13.1 mL/min (by C-G formula based on SCr of 3.42 mg/dL).  Assessment: 75 year old female with hx atrial fibrillation on chronic warfarin prior to admission now on hold for planned liver biopsy - to start heparin when INR <2. INR today 1.6 s/p Vit K 2.5mg  PO 8/14. Will initiate heparin without bolus due to poor renal function and INR >1.5. CBC low stable. No bleeding noted.   Goal of Therapy:  Heparin level 0.3-0.7 units/ml Monitor platelets by anticoagulation protocol: Yes   Plan:  -Start heparin at 900 units/hr (no bolus) -8hr HL  -Daily HL,CBC, INR -F/U plans for warfarin restart -Monitor s/sx bleeding   Stephens November, PharmD Clinical Pharmacist 7:06 AM, 04/30/2016

## 2016-04-30 NOTE — Progress Notes (Signed)
CSW provided pt husband with offers- they choose [facility]  CSW will continue to follow and assist as needed  Jorge Ny, [place] Social Worker [phone]

## 2016-04-30 NOTE — Progress Notes (Signed)
ANTICOAGULATION CONSULT NOTE - Follow Up Consult  Pharmacy Consult for IV Heparin Indication: atrial fibrillation  Allergies  Allergen Reactions  . Contrast Media  [Iodinated Diagnostic Agents] Hives  . Prednisone Other (See Comments)    Confusion    Patient Measurements: Height: 5\' 2"  (157.5 cm) Weight: 152 lb 8.9 oz (69.2 kg) IBW/kg (Calculated) : 50.1 Heparin Dosing Weight: 68 kg  Vital Signs: Temp: 97.9 F (36.6 C) (08/15 1330) Temp Source: Oral (08/15 1330) BP: 120/64 (08/15 1330) Pulse Rate: 96 (08/15 1330)  Labs:  Recent Labs  04/28/16 0734 04/29/16 0554 04/30/16 0524 04/30/16 1554  HGB  --  9.7*  --   --   HCT  --  31.4*  --   --   PLT  --  254  --   --   LABPROT 26.1* 24.6* 19.2*  --   INR 2.35 2.18 1.60  --   HEPARINUNFRC  --   --   --  0.35  CREATININE 3.83* 3.58* 3.42*  --     Estimated Creatinine Clearance: 13.1 mL/min (by C-G formula based on SCr of 3.42 mg/dL).  Assessment: 75 year old female with hx atrial fibrillation on chronic warfarin prior to admission now on hold for planned liver biopsy - heparin bridge started 8/15 AM.    Initial heparin level therapeutic on 900 units/hr.  Goal of Therapy:  Heparin level 0.3-0.7 units/ml Monitor platelets by anticoagulation protocol: Yes   Plan:  -Continue heparin at 900 units/hr  -Repeat heparin level in 6 hours for confirmation -Daily HL,CBC, INR -F/U plans for warfarin restart -Monitor s/sx bleeding   [employer], Pharm.D., BCPS Clinical Pharmacist Pager [phone] 04/30/2016 4:35 PM

## 2016-05-01 ENCOUNTER — Inpatient Hospital Stay (HOSPITAL_COMMUNITY): Payer: Medicare Other

## 2016-05-01 DIAGNOSIS — R410 Disorientation, unspecified: Secondary | ICD-10-CM

## 2016-05-01 LAB — CBC
HEMATOCRIT: 32 % — AB (ref 36.0–46.0)
HEMOGLOBIN: 9.8 g/dL — AB (ref 12.0–15.0)
MCH: 27.5 pg (ref 26.0–34.0)
MCHC: 30.6 g/dL (ref 30.0–36.0)
MCV: 89.6 fL (ref 78.0–100.0)
Platelets: 259 10*3/uL (ref 150–400)
RBC: 3.57 MIL/uL — AB (ref 3.87–5.11)
RDW: 18.1 % — ABNORMAL HIGH (ref 11.5–15.5)
WBC: 4.3 10*3/uL (ref 4.0–10.5)

## 2016-05-01 LAB — GLUCOSE, CAPILLARY
GLUCOSE-CAPILLARY: 97 mg/dL (ref 65–99)
GLUCOSE-CAPILLARY: 89 mg/dL (ref 65–99)
Glucose-Capillary: 131 mg/dL — ABNORMAL HIGH (ref 65–99)
Glucose-Capillary: 144 mg/dL — ABNORMAL HIGH (ref 65–99)

## 2016-05-01 LAB — PROTIME-INR
INR: 1.57
Prothrombin Time: 19 seconds — ABNORMAL HIGH (ref 11.4–15.2)

## 2016-05-01 LAB — COMPREHENSIVE METABOLIC PANEL
ALBUMIN: 2.6 g/dL — AB (ref 3.5–5.0)
ALT: 13 U/L — ABNORMAL LOW (ref 14–54)
AST: 21 U/L (ref 15–41)
Alkaline Phosphatase: 225 U/L — ABNORMAL HIGH (ref 38–126)
Anion gap: 8 (ref 5–15)
BILIRUBIN TOTAL: 1 mg/dL (ref 0.3–1.2)
BUN: 69 mg/dL — AB (ref 6–20)
CHLORIDE: 101 mmol/L (ref 101–111)
CO2: 30 mmol/L (ref 22–32)
Calcium: 8.6 mg/dL — ABNORMAL LOW (ref 8.9–10.3)
Creatinine, Ser: 3.26 mg/dL — ABNORMAL HIGH (ref 0.44–1.00)
GFR calc Af Amer: 15 mL/min — ABNORMAL LOW (ref >60)
GFR calc non Af Amer: 13 mL/min — ABNORMAL LOW (ref >60)
Glucose, Bld: 133 mg/dL — ABNORMAL HIGH (ref 65–99)
POTASSIUM: 4.5 mmol/L (ref 3.5–5.1)
Sodium: 139 mmol/L (ref 135–145)
Total Protein: 6.8 g/dL (ref 6.5–8.1)

## 2016-05-01 LAB — HEPARIN LEVEL (UNFRACTIONATED): HEPARIN UNFRACTIONATED: 0.46 [IU]/mL (ref 0.30–0.70)

## 2016-05-01 LAB — AMMONIA: AMMONIA: 39 umol/L — AB (ref 9–35)

## 2016-05-01 MED ORDER — MIDAZOLAM HCL 2 MG/2ML IJ SOLN
INTRAMUSCULAR | Status: AC | PRN
  Administered 2016-05-01: 1 mg via INTRAVENOUS

## 2016-05-01 MED ORDER — FENTANYL CITRATE (PF) 100 MCG/2ML IJ SOLN
INTRAMUSCULAR | Status: AC | PRN
  Administered 2016-05-01: 50 ug via INTRAVENOUS

## 2016-05-01 MED ORDER — HEPARIN (PORCINE) IN NACL 100-0.45 UNIT/ML-% IJ SOLN
1000.0000 [IU]/h | INTRAMUSCULAR | Status: DC
  Administered 2016-05-01: 900 [IU]/h via INTRAVENOUS
  Administered 2016-05-03: 1000 [IU]/h via INTRAVENOUS
  Filled 2016-05-01 (×2): qty 250

## 2016-05-01 MED ORDER — MORPHINE SULFATE (PF) 2 MG/ML IV SOLN
1.0000 mg | Freq: Once | INTRAVENOUS | Status: AC
  Administered 2016-05-01: 1 mg via INTRAVENOUS
  Filled 2016-05-01: qty 1

## 2016-05-01 MED ORDER — MIDAZOLAM HCL 2 MG/2ML IJ SOLN
INTRAMUSCULAR | Status: AC
  Filled 2016-05-01: qty 2

## 2016-05-01 MED ORDER — LIDOCAINE HCL (PF) 1 % IJ SOLN
INTRAMUSCULAR | Status: AC
  Filled 2016-05-01: qty 10

## 2016-05-01 MED ORDER — FENTANYL CITRATE (PF) 100 MCG/2ML IJ SOLN
INTRAMUSCULAR | Status: AC
  Filled 2016-05-01: qty 2

## 2016-05-01 MED ORDER — GELATIN ABSORBABLE 12-7 MM EX MISC
CUTANEOUS | Status: AC
  Filled 2016-05-01: qty 1

## 2016-05-01 NOTE — Progress Notes (Signed)
ANTICOAGULATION CONSULT NOTE - Follow Up Consult  Pharmacy Consult for IV Heparin Indication: atrial fibrillation  Allergies  Allergen Reactions  . Contrast Media  [Iodinated Diagnostic Agents] Hives  . Prednisone Other (See Comments)    Confusion    Patient Measurements: Height: 5\' 2"  (157.5 cm) Weight: 153 lb 3.5 oz (69.5 kg) IBW/kg (Calculated) : 50.1 Heparin Dosing Weight: 68 kg  Vital Signs: Temp: 97.7 F (36.5 C) (08/16 1607) BP: 103/52 (08/16 1607) Pulse Rate: 69 (08/16 1607)  Labs:  Recent Labs  04/29/16 0554 04/30/16 0524 04/30/16 1554 04/30/16 2135 05/01/16 0709  HGB 9.7*  --   --   --  9.8*  HCT 31.4*  --   --   --  32.0*  PLT 254  --   --   --  259  LABPROT 24.6* 19.2*  --   --  19.0*  INR 2.18 1.60  --   --  1.57  HEPARINUNFRC  --   --  0.35 0.41 0.46  CREATININE 3.58* 3.42*  --   --  3.26*    Estimated Creatinine Clearance: 13.8 mL/min (by C-G formula based on SCr of 3.26 mg/dL).  Assessment: 75 year old female with hx atrial fibrillation on chronic warfarin prior to admission now on hold for planned liver biopsy - heparin bridge started 8/15 AM.    Now s/p liver biopsy, heparin to resume 6 hours post procedure at 2200 pm  Goal of Therapy:  Heparin level 0.3-0.7 units/ml Monitor platelets by anticoagulation protocol: Yes   Plan:  -Heparin at 900 units/hr starting at 10 pm -Daily HL,CBC, INR -F/U plans for warfarin restart -Monitor s/sx bleeding  Thank you Anette Guarneri, PharmD [phone] 4:19 PM, 05/01/2016

## 2016-05-01 NOTE — Progress Notes (Signed)
PROGRESS NOTE    Cassandra Bonilla  ASN:[id number] DOB: 10-09-1940 DOA: 04/20/2016 PCP: Marco Collie, MD   Outpatient Specialists: Delsa Grana)- nephrology    Brief Narrative:  Cassandra Bonilla is a 75 y.o. female with a past medical history significant for Afib not on anticoagulation (due to anemia?), chronic systolic CHF EF 19%, anemia, CKD IV-V, and DM who presents with itching but is found to have hypokalemia again. Per history, the patient was ambulatory and able to climb "13 flights of stairs to our apartment" in [place] as recently as a year ago, but in February had a flare of CHF and worsening of her renal function (previously baseline Cr high 1s, subsequently 2-3), and since then has been more debilitated, walking only with a walker.  Two months ago she moved with her husband from [place], and was admitted with hypokalemia here 5 weeks ago.  Husband states this is the 6th hospitalization this calandar year.  Records from previous hospital reviewed and are in [place]. Echocardiogram in May 2017 in [place]: EF 40-45% Severe PH Moderate to severe TR  CKD IV and systolic CHF EF 35 %.  Here for confusion, fluid overload, pruritus. Elevated ammonia level with liver inflammatory changes, Cardio/ renal /GI following. Vein mapping for dialysis access, live biopsy on 8/16, currently on heparin drip  Assessment & Plan:   Principal Problem:   Hypokalemia Active Problems:   Anemia of chronic disease   Chronic atrial fibrillation (HCC)   Type 2 diabetes mellitus with stage 4 chronic kidney disease, with long-term current use of insulin (HCC)   Acute on chronic systolic (congestive) heart failure (HCC)   CKD (chronic kidney disease), stage IV (HCC)   Fluid overload   Acute encephalopathy   Acute on chronic renal failure (HCC)   Abnormal alkaline phosphatase test    Acute on Chronic systolic and right sided CHF:   From CKD and right sided and systolic CHF EF 37-90% in May at last echocardiogram in [place].  Pulmonary HTN.   Not on lisinopril because of renal insufficiency.  Echo EF 35 % by echo during this admission -On 05/01/2016 she has a net negative fluid balance of 11.7 L, output of 2.25 L overnight. -Plan to continue Lasix 160 mg by mouth twice a day, appears euvolemic on exam  Acute on  chronic renal insufficiency: CKD IV-V:  -Lab work showing downward trend in creatinine, today at 3.26, down from 3.9 on 04/28/2016 -Continue Lasix 160 mg by mouth twice a day  Encephalopathy, acute;  -Family members reporting that at baseline she is confused, suspect has dementia. -Worsening mental status changes likely multifactorial, with acute on chronic renal failure, hyperammonemia, hospitalization, all probable contributors  Elevated alk phos, elevated ammonia level; Pruritus:  [name] cholecystectomy and underline hepatic inflammatory changes.  Ammonia remain elevated despite lactulose, start rifaximin  Alk phosphatase elevated, pruritus. ANA mildly elevated, mitochondrial antibodies negative.   viral hepatitis panel negative Denies abdominal pain.  GI consulted, plan for liver biopsy today  Atrial fibrillation:  CHADS2-VASc 5.   -Previously on anticoagulant with warfarin -Currently on IV heparin bridge for planned liver biopsy -Pharmacy consulted for warfarin management  Hypokalemia:  Lab work on 05/01/2016 showed potassium of 4.5  IDDM:  - home glargine held initially, restarted on 8/11 5units qham -Sliding scale corrections, renal dose  Coronary artery disease and HTN: History of CABG, PCI 2, CEA. -Continue asa, amlodipine, furosemide, metolazone, metorpolol, and statin  Left leg pain, left shoulder pain ,  patient is not very specific with the location of the pain, report entire leg hurts, lumber /left hip/left knee x ray/ left shoulder x ray unremarkable, Unfortunately opioid is not an option for pain control while she is on  naltraxone for pruritis. She is not a  candidate for NSAIDs due to renal impairment, only option for pain is tylenol for now,   Will try flexeril prn , add neurontin qhs I have discussed with gi  Dr Lorayne Bender who oked to d/c naltraxone, now she can have percocet prn for pain if flexeril/neurontin not effective   DVT prophylaxis:  Lovenox to coumadin Heparin drip when INR less than 2  Code Status: Full Code  Family Communication: I spoke with her daughter and husband were present at bedside  Disposition Plan:  Plan for liver biopsy, possible d/c to SNF in the next 24-48 hours  Consultants:   Cardiology  Nephrology   GI  Procedures:   none  Subjective: She remains confused, disoriented, though is arousable and can tell me that she is at the hospital.   Objective: Vitals:   04/30/16 0515 04/30/16 1330 04/30/16 2145 05/01/16 0517  BP: (!) 149/68 120/64 130/73 134/72  Pulse: 97 96 96 96  Resp: 17 18 18 18   Temp: 97.5 F (36.4 C) 97.9 F (36.6 C) 98.3 F (36.8 C) 98.6 F (37 C)  TempSrc: Oral Oral    SpO2: 100% 100% 99% 99%  Weight: 69.2 kg (152 lb 8.9 oz)   69.5 kg (153 lb 3.5 oz)  Height:        Intake/Output Summary (Last 24 hours) at 05/01/16 1228 Last data filed at 05/01/16 1116  Gross per 24 hour  Intake              256 ml  Output             2900 ml  Net            -2644 ml   Filed Weights   04/29/16 0450 04/30/16 0515 05/01/16 0517  Weight: 74 kg (163 lb 1.6 oz) 69.2 kg (152 lb 8.9 oz) 69.5 kg (153 lb 3.5 oz)    Examination:  General exam: Arousable, follows simple commands Respiratory system: Normal inspiratory effort, few bibasilar crackles Cardiovascular system: S1 & S2 heard, RRR. No JVD, murmurs, rubs, gallops or clicks- edema to thighs Gastrointestinal system: Abdomen is nondistended, soft and nontender. No organomegaly or masses felt. Normal bowel sounds heard. Extremities: moves all 4 ext, decreasing edema bilateral lower extremities, legs start to wrinkle     Data  Reviewed: I have personally reviewed following labs and imaging studies  CBC:  Recent Labs Lab 04/25/16 0431 04/27/16 0658 04/29/16 0554 05/01/16 0709  WBC 5.0 5.3 4.5 4.3  HGB 9.8* 9.8* 9.7* 9.8*  HCT 30.7* 31.3* 31.4* 32.0*  MCV 87.7 86.7 89.7 89.6  PLT 188 286 254 448   Basic Metabolic Panel:  Recent Labs Lab 04/26/16 0432 04/27/16 0658 04/28/16 0734 04/29/16 0554 04/30/16 0524 05/01/16 0709  NA 133* 136 136 135 138 139  K 4.8 4.3 4.4 4.5 4.2 4.5  CL 94* 97* 97* 99* 98* 101  CO2 25 26 25 26 28 30   GLUCOSE 155* 100* 146* 190* 99 133*  BUN 84* 85* 82* 78* 71* 69*  CREATININE 3.94* 3.82* 3.83* 3.58* 3.42* 3.26*  CALCIUM 8.4* 8.5* 8.5* 8.4* 8.6* 8.6*  PHOS 4.9* 4.9*  --   --   --   --  GFR: Estimated Creatinine Clearance: 13.8 mL/min (by C-G formula based on SCr of 3.26 mg/dL). Liver Function Tests:  Recent Labs Lab 04/27/16 0658 04/28/16 0734 04/29/16 0554 04/30/16 0524 05/01/16 0709  AST  --  22 19 23 21   ALT  --  13* 12* 14 13*  ALKPHOS  --  241* 227* 238* 225*  BILITOT  --  1.2 0.9 1.1 1.0  PROT  --  6.9 6.8 7.2 6.8  ALBUMIN 2.6* 2.6* 2.5* 2.7* 2.6*   No results for input(s): LIPASE, AMYLASE in the last 168 hours.  Recent Labs Lab 04/26/16 0432 04/28/16 0454 04/29/16 0554 04/30/16 0524 05/01/16 0709  AMMONIA 81* 74* 72* 46* 39*   Coagulation Profile:  Recent Labs Lab 04/27/16 0658 04/28/16 0734 04/29/16 0554 04/30/16 0524 05/01/16 0709  INR 2.44 2.35 2.18 1.60 1.57   Cardiac Enzymes: No results for input(s): CKTOTAL, CKMB, CKMBINDEX, TROPONINI in the last 168 hours. BNP (last 3 results) No results for input(s): PROBNP in the last 8760 hours. HbA1C: No results for input(s): HGBA1C in the last 72 hours. CBG:  Recent Labs Lab 04/30/16 1211 04/30/16 1644 04/30/16 2144 05/01/16 0725 05/01/16 1126  GLUCAP 161* 154* 153* 131* 97   Lipid Profile: No results for input(s): CHOL, HDL, LDLCALC, TRIG, CHOLHDL, LDLDIRECT in the last  72 hours. Thyroid Function Tests: No results for input(s): TSH, T4TOTAL, FREET4, T3FREE, THYROIDAB in the last 72 hours. Anemia Panel: No results for input(s): VITAMINB12, FOLATE, FERRITIN, TIBC, IRON, RETICCTPCT in the last 72 hours. Urine analysis:    Component Value Date/Time   COLORURINE YELLOW 04/23/2016 0015   APPEARANCEUR CLOUDY (A) 04/23/2016 0015   LABSPEC 1.009 04/23/2016 0015   PHURINE 7.0 04/23/2016 0015   GLUCOSEU NEGATIVE 04/23/2016 0015   HGBUR NEGATIVE 04/23/2016 0015   BILIRUBINUR NEGATIVE 04/23/2016 0015   KETONESUR NEGATIVE 04/23/2016 0015   PROTEINUR 30 (A) 04/23/2016 0015   NITRITE NEGATIVE 04/23/2016 0015   LEUKOCYTESUR NEGATIVE 04/23/2016 0015     ) Recent Results (from the past 240 hour(s))  MRSA PCR Screening     Status: None   Collection Time: 04/28/16  8:35 PM  Result Value Ref Range Status   MRSA by PCR NEGATIVE NEGATIVE Final    Comment:        The GeneXpert MRSA Assay (FDA approved for NASAL specimens only), is one component of a comprehensive MRSA colonization surveillance program. It is not intended to diagnose MRSA infection nor to guide or monitor treatment for MRSA infections.       Anti-infectives    Start     Dose/Rate Route Frequency Ordered Stop   04/26/16 1600  rifaximin (XIFAXAN) tablet 400 mg     400 mg Oral 3 times daily 04/26/16 1200     04/26/16 1000  rifaximin (XIFAXAN) tablet 200 mg  Status:  Discontinued     200 mg Oral 3 times daily 04/26/16 0867 04/26/16 1200       Radiology Studies: Ct Head Wo Contrast  Result Date: 04/29/2016 CLINICAL DATA:  75 year old female with a history of confusion EXAM: CT HEAD WITHOUT CONTRAST TECHNIQUE: Contiguous axial images were obtained from the base of the skull through the vertex without intravenous contrast. COMPARISON:  None. FINDINGS: Unremarkable appearance of the calvarium without acute fracture or aggressive lesion. Unremarkable appearance of the scalp soft tissues.  Unremarkable appearance of the bilateral orbits. Air-fluid level of the right mastoid air cells which are partially opacified. Fluid extends into the middle ear. Fluid density involving  the features apex, just anterior to the cochlea. No significant paranasal sinus disease No acute intracranial hemorrhage. No midline shift or mass effect. Unremarkable configuration the ventricles. Gray-white differentiation maintained. Calcifications of the intracranial vasculature. IMPRESSION: No CT evidence of acute intracranial abnormality. Partially opacified right-sided mastoid air cells with air-fluid level, potentially representing mastoiditis. Right middle ear effusion. Intracranial atherosclerosis. Signed, Dulcy Fanny. Earleen Newport, DO [facility] Electronically Signed   By: Corrie Mckusick D.O.   On: 04/29/2016 16:04   Dg Shoulder Left  Result Date: 04/30/2016 CLINICAL DATA:  Body aches and left shoulder joint region discomfort. No known injury, history of gout. EXAM: LEFT SHOULDER - 2+ VIEW COMPARISON:  Chest x-ray of April 28, 2016 which included portions of the left shoulder. FINDINGS: The bones are subjectively osteopenic. The glenohumeral joint space is preserved. There is mild inferior articular margin spurring of the glenoid. The [facility] joint exhibits mild narrowing with some acromial articular surface irregularity. The subacromial subdeltoid space is preserved. The observed portions of the left clavicle and upperleft ribs are normal. IMPRESSION: There is no acute bony abnormality of the left shoulder. Mild degenerative change of the inferior rim of the glenoid and of the [facility] joint is present. Electronically Signed   By: David  Martinique M.D.   On: 04/30/2016 08:46        Scheduled Meds: . allopurinol  100 mg Oral Daily  . aspirin EC  81 mg Oral Daily  . atorvastatin  40 mg Oral Daily  . furosemide  160 mg Oral BID  . gabapentin  100 mg Oral QHS  . guaiFENesin   600 mg Oral BID  . insulin aspart  0-5 Units Subcutaneous QHS  . insulin aspart  0-9 Units Subcutaneous TID WC  . insulin glargine  5 Units Subcutaneous Daily  . lactulose  20 g Oral BID  . metoprolol succinate  50 mg Oral Daily  . pantoprazole  40 mg Oral Daily  . potassium chloride  40 mEq Oral BID  . rifaximin  400 mg Oral TID  . sodium chloride flush  3 mL Intravenous Q12H   Continuous Infusions: . heparin 900 Units/hr (04/30/16 0851)     LOS: 9 days    Time spent: 35 min    Kelvin Cellar, MD PhD [facility] Pager [phone]  If 7PM-7AM, please contact night-coverage [email or web address] Password [facility] 05/01/2016, 12:28 PM

## 2016-05-01 NOTE — Procedures (Signed)
Interventional Radiology Procedure Note  Procedure: US guided biopsy of liver, medical renal.   Complications: None Recommendations:  - Ok to shower tomorrow - Do not submerge for 7 days - Routine care care - May restart heparin after 6 hours.    Signed,  Dulcy Fanny. Earleen Newport, DO

## 2016-05-01 NOTE — Progress Notes (Signed)
ANTICOAGULATION CONSULT NOTE - Follow Up Consult  Pharmacy Consult for IV Heparin Indication: atrial fibrillation  Allergies  Allergen Reactions  . Contrast Media  [Iodinated Diagnostic Agents] Hives  . Prednisone Other (See Comments)    Confusion    Patient Measurements: Height: 5\' 2"  (157.5 cm) Weight: 153 lb 3.5 oz (69.5 kg) IBW/kg (Calculated) : 50.1 Heparin Dosing Weight: 68 kg  Vital Signs: Temp: 98.6 F (37 C) (08/16 0517) BP: 134/72 (08/16 0517) Pulse Rate: 96 (08/16 0517)  Labs:  Recent Labs  04/29/16 0554 04/30/16 0524 04/30/16 1554 04/30/16 2135 05/01/16 0709  HGB 9.7*  --   --   --  9.8*  HCT 31.4*  --   --   --  32.0*  PLT 254  --   --   --  259  LABPROT 24.6* 19.2*  --   --  19.0*  INR 2.18 1.60  --   --  1.57  HEPARINUNFRC  --   --  0.35 0.41 0.46  CREATININE 3.58* 3.42*  --   --   --     Estimated Creatinine Clearance: 13.2 mL/min (by C-G formula based on SCr of 3.42 mg/dL).  Assessment: 75 year old female with hx atrial fibrillation on chronic warfarin prior to admission now on hold for planned liver biopsy - heparin bridge started 8/15 AM.    INR remains >1.5 today at 1.57 s/p Vit K 2.5mg  8/14. HL therapeutic at 0.46 No bleeding noted.  Goal of Therapy:  Heparin level 0.3-0.7 units/ml Monitor platelets by anticoagulation protocol: Yes   Plan:  -Continue heparin at 900 units/hr  -Daily HL,CBC, INR -F/U plans for warfarin restart -Monitor s/sx bleeding  Stephens November, PharmD Clinical Pharmacist 8:37 AM, 05/01/2016

## 2016-05-01 NOTE — Progress Notes (Addendum)
[facility] Progress Note  Chief Complaint: Elevated LFTs (ALP)  Subjective  History:  Patient is somnolent and cannot provide much history.  She seems to deny pain. She is chronically dyspneic, and her appetite has been fair according to her husband.  ROS: Denies chest pain  Objective:  Med list reviewed  Vital signs in last 24 hrs: Vitals:   05/01/16 0517 05/01/16 1512  BP: 134/72 128/70  Pulse: 96 82  Resp: 18 15  Temp: 98.6 F (37 C)     Physical Exam   HEENT: sclera anicteric, oral mucosa moist without lesions  Neck: supple, no thyromegaly, she has marked distension of right EJ and IJ  Cardiac: irregular with systolic murmur,  [id number] heard, mild peripheral edema  Pulm: clear to auscultation bilaterally, normal RR and fair effort noted  Abdomen: soft, no tenderness, with active bowel sounds. No guarding or palpable hepatosplenomegaly  Skin; warm and dry, no jaundice or rash  Recent Labs:   Recent Labs Lab 04/27/16 0658 04/29/16 0554 05/01/16 0709  WBC 5.3 4.5 4.3  HGB 9.8* 9.7* 9.8*  HCT 31.3* 31.4* 32.0*  PLT 286 254 259    Recent Labs Lab 05/01/16 0709  NA 139  K 4.5  CL 101  CO2 30  BUN 69*  ALBUMIN 2.6*  ALKPHOS 225*  ALT 13*  AST 21  GLUCOSE 133*  NH3 decreased to 39 AMA Ab normal at 12  Recent Labs Lab 05/01/16 0709  INR 1.57   I reviewed the report of her recent cardiac 2D echo.  Pulm HTN, biatrial enlargement, marked TR, restrictive (diastolic) physiology and low LVEF  @ASSESSMENTPLANBEGIN @ Assessment:  Elevated alkaline phosphatase.  I think this is most likely due to congestive hepatopathy, perhaps even cirrhosis from that.   Altered mental status seems multifactorial, partially from hepatic encephalopathy. On lactulose  Plan:  Await liver Bx results.  It will help to have a more definitive diagnosis, if even only for prognostic purposes in this complicated cardiac patient with apparently poor prognosis.  Nelida Meuse  III Pager [phone] Mon-Fri 8a-5p [phone] after 5p, weekends, holidays  Total time 25 minutes, over half spent reviewing chart and in discussion with patient's husband.

## 2016-05-01 NOTE — Sedation Documentation (Signed)
Patient is resting comfortably. 

## 2016-05-02 LAB — COMPREHENSIVE METABOLIC PANEL
ALBUMIN: 2.5 g/dL — AB (ref 3.5–5.0)
ALK PHOS: 196 U/L — AB (ref 38–126)
ALT: 13 U/L — ABNORMAL LOW (ref 14–54)
ANION GAP: 10 (ref 5–15)
AST: 22 U/L (ref 15–41)
BILIRUBIN TOTAL: 0.7 mg/dL (ref 0.3–1.2)
BUN: 66 mg/dL — AB (ref 6–20)
CALCIUM: 8.2 mg/dL — AB (ref 8.9–10.3)
CO2: 27 mmol/L (ref 22–32)
Chloride: 101 mmol/L (ref 101–111)
Creatinine, Ser: 3.19 mg/dL — ABNORMAL HIGH (ref 0.44–1.00)
GFR calc Af Amer: 15 mL/min — ABNORMAL LOW (ref >60)
GFR calc non Af Amer: 13 mL/min — ABNORMAL LOW (ref >60)
GLUCOSE: 227 mg/dL — AB (ref 65–99)
Potassium: 4.4 mmol/L (ref 3.5–5.1)
SODIUM: 138 mmol/L (ref 135–145)
TOTAL PROTEIN: 6.5 g/dL (ref 6.5–8.1)

## 2016-05-02 LAB — CBC
HCT: 30 % — ABNORMAL LOW (ref 36.0–46.0)
HEMOGLOBIN: 9.1 g/dL — AB (ref 12.0–15.0)
MCH: 27.2 pg (ref 26.0–34.0)
MCHC: 30.3 g/dL (ref 30.0–36.0)
MCV: 89.6 fL (ref 78.0–100.0)
PLATELETS: 284 10*3/uL (ref 150–400)
RBC: 3.35 MIL/uL — AB (ref 3.87–5.11)
RDW: 18.1 % — ABNORMAL HIGH (ref 11.5–15.5)
WBC: 4.8 10*3/uL (ref 4.0–10.5)

## 2016-05-02 LAB — GLUCOSE, CAPILLARY
GLUCOSE-CAPILLARY: 300 mg/dL — AB (ref 65–99)
GLUCOSE-CAPILLARY: 127 mg/dL — AB (ref 65–99)
GLUCOSE-CAPILLARY: 144 mg/dL — AB (ref 65–99)
Glucose-Capillary: 282 mg/dL — ABNORMAL HIGH (ref 65–99)

## 2016-05-02 LAB — AMMONIA: Ammonia: 80 umol/L — ABNORMAL HIGH (ref 9–35)

## 2016-05-02 LAB — HEPARIN LEVEL (UNFRACTIONATED)
Heparin Unfractionated: 0.2 IU/mL — ABNORMAL LOW (ref 0.30–0.70)
Heparin Unfractionated: 0.55 IU/mL (ref 0.30–0.70)

## 2016-05-02 LAB — PROTIME-INR
INR: 1.4
Prothrombin Time: 17.3 seconds — ABNORMAL HIGH (ref 11.4–15.2)

## 2016-05-02 MED ORDER — LACTULOSE 10 GM/15ML PO SOLN
20.0000 g | Freq: Three times a day (TID) | ORAL | Status: DC
  Administered 2016-05-02 – 2016-05-03 (×4): 20 g via ORAL
  Filled 2016-05-02 (×4): qty 30

## 2016-05-02 NOTE — Progress Notes (Signed)
PROGRESS NOTE    Cassandra Bonilla  MEQ:[id number] DOB: November 20, 1940 DOA: 04/20/2016 PCP: Marco Collie, MD   Outpatient Specialists: Delsa Grana)- nephrology    Brief Narrative:  Cassandra Bonilla is a 75 y.o. female with a past medical history significant for Afib not on anticoagulation (due to anemia?), chronic systolic CHF EF 29%, anemia, CKD IV-V, and DM who presents with itching but is found to have hypokalemia again. Per history, the patient was ambulatory and able to climb "13 flights of stairs to our apartment" in [place] as recently as a year ago, but in February had a flare of CHF and worsening of her renal function (previously baseline Cr high 1s, subsequently 2-3), and since then has been more debilitated, walking only with a walker.  Two months ago she moved with her husband from [place], and was admitted with hypokalemia here 5 weeks ago.  Husband states this is the 6th hospitalization this calandar year.  Records from previous hospital reviewed and are in [place]. Echocardiogram in May 2017 in [place]: EF 40-45% Severe PH Moderate to severe TR  CKD IV and systolic CHF EF 35 %.  Here for confusion, fluid overload, pruritus. Elevated ammonia level with liver inflammatory changes, Cardio/ renal /GI following. Vein mapping for dialysis access, live biopsy on 8/16, currently on heparin drip  Assessment & Plan:   Principal Problem:   Hypokalemia Active Problems:   Anemia of chronic disease   Chronic atrial fibrillation (HCC)   Type 2 diabetes mellitus with stage 4 chronic kidney disease, with long-term current use of insulin (HCC)   Acute on chronic systolic (congestive) heart failure (HCC)   CKD (chronic kidney disease), stage IV (HCC)   Fluid overload   Delirium   Acute on chronic renal failure (HCC)   Abnormal alkaline phosphatase test    Acute on Chronic systolic and right sided CHF:   From CKD and right sided and systolic CHF EF 79-89% in May at last echocardiogram in [place].   Pulmonary HTN.   Not on lisinopril because of renal insufficiency.  Echo EF 35 % by echo during this admission -On 05/02/2016 she has a net negative fluid balance of 12.9 L to date -Plan to continue Lasix 160 mg by mouth twice a day, appears euvolemic on exam. Renal function improving  Acute on  chronic renal insufficiency: CKD IV-V:  -Lab work showing downward trend in creatinine, today at 3.19, down from 3.9 on 04/28/2016 -Continue Lasix 160 mg by mouth twice a day  Encephalopathy, acute;  -Family members reporting that at baseline she is confused, suspect has dementia. -Worsening mental status changes likely multifactorial, with acute on chronic renal failure, hyperammonemia, hospitalization, all probable contributors -Ammonia level at 80, will increase lactulose to 3 times a day dosing  Elevated alk phos, elevated ammonia level; Pruritus:  [name] cholecystectomy and underline hepatic inflammatory changes.  Ammonia remain elevated despite lactulose, start rifaximin  Alk phosphatase elevated, pruritus. ANA mildly elevated, mitochondrial antibodies negative.   viral hepatitis panel negative Denies abdominal pain.  GI consulted she underwent liver biopsy on 05/01/2016, awaiting pathology As mentioned above ammonia level elevated at 80, will increase lactulose to 3 times a day dosing  Atrial fibrillation:  [place] 5.   -Pharmacy consulted for warfarin dosing  Hypokalemia:  Lab work on 05/01/2016 showed potassium of 4.5  IDDM:  - home glargine held initially, restarted on 8/11 5units qham -Sliding scale corrections, renal dose  Coronary artery disease and HTN: History of CABG, PCI 2,  CEA. -Continue asa, amlodipine, furosemide, metolazone, metorpolol, and statin  Left leg pain, left shoulder pain ,  patient is not very specific with the location of the pain, report entire leg hurts, lumber /left hip/left knee x ray/ left shoulder x ray unremarkable, Unfortunately opioid is  not an option for pain control while she is on  naltraxone for pruritis. She is not a candidate for NSAIDs due to renal impairment, only option for pain is tylenol for now,   Will try flexeril prn , add neurontin qhs I have discussed with gi  Dr Lorayne Bender who oked to d/c naltraxone, now she can have percocet prn for pain if flexeril/neurontin not effective   DVT prophylaxis:  Lovenox to coumadin Heparin drip when INR less than 2  Code Status: Full Code  Family Communication: I spoke with her daughter and husband were present at bedside  Disposition Plan:  Anticipate discharge to skilled nursing facility in the next 24 hours  Consultants:   Cardiology  Nephrology   GI  Procedures:   none  Subjective: She remains confused, disoriented    Objective: Vitals:   05/01/16 1821 05/01/16 2052 05/02/16 0549 05/02/16 1357  BP: 130/68 132/72 126/77 134/79  Pulse: 82 95 93 95  Resp: 20 16 17 20   Temp: 98.2 F (36.8 C) 99.3 F (37.4 C) 99 F (37.2 C) 98 F (36.7 C)  TempSrc:  Axillary Oral Oral  SpO2: 98% 99% 98% 100%  Weight:   69.5 kg (153 lb 3.2 oz)   Height:        Intake/Output Summary (Last 24 hours) at 05/02/16 1458 Last data filed at 05/02/16 1300  Gross per 24 hour  Intake          1457.25 ml  Output             2650 ml  Net         -1192.75 ml   Filed Weights   04/30/16 0515 05/01/16 0517 05/02/16 0549  Weight: 69.2 kg (152 lb 8.9 oz) 69.5 kg (153 lb 3.5 oz) 69.5 kg (153 lb 3.2 oz)    Examination:  General exam: Arousable, follows simple commands Respiratory system: Normal inspiratory effort, few bibasilar crackles Cardiovascular system: S1 & S2 heard, RRR. No JVD, murmurs, rubs, gallops or clicks- edema to thighs Gastrointestinal system: Abdomen is nondistended, soft and nontender. No organomegaly or masses felt. Normal bowel sounds heard. Extremities: moves all 4 ext, decreasing edema bilateral lower extremities, legs start to wrinkle     Data  Reviewed: I have personally reviewed following labs and imaging studies  CBC:  Recent Labs Lab 04/27/16 0658 04/29/16 0554 05/01/16 0709 05/02/16 0403  WBC 5.3 4.5 4.3 4.8  HGB 9.8* 9.7* 9.8* 9.1*  HCT 31.3* 31.4* 32.0* 30.0*  MCV 86.7 89.7 89.6 89.6  PLT 286 254 259 712   Basic Metabolic Panel:  Recent Labs Lab 04/26/16 0432 04/27/16 0658 04/28/16 0734 04/29/16 0554 04/30/16 0524 05/01/16 0709 05/02/16 0403  NA 133* 136 136 135 138 139 138  K 4.8 4.3 4.4 4.5 4.2 4.5 4.4  CL 94* 97* 97* 99* 98* 101 101  CO2 25 26 25 26 28 30 27   GLUCOSE 155* 100* 146* 190* 99 133* 227*  BUN 84* 85* 82* 78* 71* 69* 66*  CREATININE 3.94* 3.82* 3.83* 3.58* 3.42* 3.26* 3.19*  CALCIUM 8.4* 8.5* 8.5* 8.4* 8.6* 8.6* 8.2*  PHOS 4.9* 4.9*  --   --   --   --   --  GFR: Estimated Creatinine Clearance: 14.1 mL/min (by C-G formula based on SCr of 3.19 mg/dL). Liver Function Tests:  Recent Labs Lab 04/28/16 0734 04/29/16 0554 04/30/16 0524 May 11, 2016 0709 05/02/16 0403  AST 22 19 23 21 22   ALT 13* 12* 14 13* 13*  ALKPHOS 241* 227* 238* 225* 196*  BILITOT 1.2 0.9 1.1 1.0 0.7  PROT 6.9 6.8 7.2 6.8 6.5  ALBUMIN 2.6* 2.5* 2.7* 2.6* 2.5*   No results for input(s): LIPASE, AMYLASE in the last 168 hours.  Recent Labs Lab 04/28/16 0454 04/29/16 0554 04/30/16 0524 05-11-16 0709 05/02/16 0403  AMMONIA 74* 72* 46* 39* 80*   Coagulation Profile:  Recent Labs Lab 04/28/16 0734 04/29/16 0554 04/30/16 0524 May 11, 2016 0709 05/02/16 0403  INR 2.35 2.18 1.60 1.57 1.40   Cardiac Enzymes: No results for input(s): CKTOTAL, CKMB, CKMBINDEX, TROPONINI in the last 168 hours. BNP (last 3 results) No results for input(s): PROBNP in the last 8760 hours. HbA1C: No results for input(s): HGBA1C in the last 72 hours. CBG:  Recent Labs Lab 05-11-2016 1126 11-May-2016 1630 05-11-16 2050 05/02/16 0752 05/02/16 1157  GLUCAP 97 89 144* 300* 282*   Lipid Profile: No results for input(s): CHOL,  HDL, LDLCALC, TRIG, CHOLHDL, LDLDIRECT in the last 72 hours. Thyroid Function Tests: No results for input(s): TSH, T4TOTAL, FREET4, T3FREE, THYROIDAB in the last 72 hours. Anemia Panel: No results for input(s): VITAMINB12, FOLATE, FERRITIN, TIBC, IRON, RETICCTPCT in the last 72 hours. Urine analysis:    Component Value Date/Time   COLORURINE YELLOW 04/23/2016 0015   APPEARANCEUR CLOUDY (A) 04/23/2016 0015   LABSPEC 1.009 04/23/2016 0015   PHURINE 7.0 04/23/2016 0015   GLUCOSEU NEGATIVE 04/23/2016 0015   HGBUR NEGATIVE 04/23/2016 0015   BILIRUBINUR NEGATIVE 04/23/2016 0015   KETONESUR NEGATIVE 04/23/2016 0015   PROTEINUR 30 (A) 04/23/2016 0015   NITRITE NEGATIVE 04/23/2016 0015   LEUKOCYTESUR NEGATIVE 04/23/2016 0015     ) Recent Results (from the past 240 hour(s))  MRSA PCR Screening     Status: None   Collection Time: 04/28/16  8:35 PM  Result Value Ref Range Status   MRSA by PCR NEGATIVE NEGATIVE Final    Comment:        The GeneXpert MRSA Assay (FDA approved for NASAL specimens only), is one component of a comprehensive MRSA colonization surveillance program. It is not intended to diagnose MRSA infection nor to guide or monitor treatment for MRSA infections.       Anti-infectives    Start     Dose/Rate Route Frequency Ordered Stop   04/26/16 1600  rifaximin (XIFAXAN) tablet 400 mg     400 mg Oral 3 times daily 04/26/16 1200     04/26/16 1000  rifaximin (XIFAXAN) tablet 200 mg  Status:  Discontinued     200 mg Oral 3 times daily 04/26/16 2563 04/26/16 1200       Radiology Studies: US Biopsy  Result Date: May 11, 2016 INDICATION: 75 year old female with a history of liver disease. She has been referred for medical liver biopsy EXAM: ULTRASOUND BIOPSY CORE LIVER MEDICATIONS: None. ANESTHESIA/SEDATION: Moderate (conscious) sedation was employed during this procedure. A total of Versed 1.0 mg and Fentanyl 50 mcg was administered intravenously. Moderate Sedation  Time: 15 minutes. The patient's level of consciousness and vital signs were monitored continuously by radiology nursing throughout the procedure under my direct supervision. FLUOROSCOPY TIME:  None COMPLICATIONS: None PROCEDURE: Informed written consent was obtained from the patient after a thorough discussion of the procedural  risks, benefits and alternatives. All questions were addressed. Maximal Sterile Barrier Technique was utilized including caps, mask, sterile gowns, sterile gloves, sterile drape, hand hygiene and skin antiseptic. A timeout was performed prior to the initiation of the procedure. Patient is positioned supine position on the ultrasound table. Ultrasound images were performed of the upper abdomen. Patient is an prepped and draped in the usual sterile fashion. The skin and subcutaneous tissues were generously infiltrated 1% lidocaine for local anesthesia. Small stab incision was made with 11 blade scalpel. Using ultrasound guidance, a 73 gauge guide needle was advanced through the tissues, in a region where the ascites was not separating the body wall from the liver margin into the right liver lobe. Three separate 18 gauge core biopsy achieved. Three Gel-Foam pledgets were then infused. Needle was removed and a final image was stored. Patient tolerated the procedure well and remained hemodynamically stable throughout. No complications were encountered and no significant blood loss. IMPRESSION: Status post ultrasound-guided biopsy of right liver lobe for medical liver purpose. Tissue specimen sent to pathology for complete histopathologic analysis. Signed, Dulcy Fanny. Earleen Newport, DO [facility] Radiology Electronically Signed   By: Corrie Mckusick D.O.   On: 05/01/2016 16:39        Scheduled Meds: . allopurinol  100 mg Oral Daily  . aspirin EC  81 mg Oral Daily  . atorvastatin  40 mg Oral Daily  . furosemide  160 mg Oral BID  . guaiFENesin  600 mg  Oral BID  . insulin aspart  0-5 Units Subcutaneous QHS  . insulin aspart  0-9 Units Subcutaneous TID WC  . insulin glargine  5 Units Subcutaneous Daily  . lactulose  20 g Oral TID  . metoprolol succinate  50 mg Oral Daily  . pantoprazole  40 mg Oral Daily  . potassium chloride  40 mEq Oral BID  . rifaximin  400 mg Oral TID  . sodium chloride flush  3 mL Intravenous Q12H   Continuous Infusions: . heparin 1,000 Units/hr (05/02/16 0643)     LOS: 10 days    Time spent: 25 min    Kelvin Cellar, MD [facility] Pager [phone]  If 7PM-7AM, please contact night-coverage [email or web address] Password [facility] 05/02/2016, 2:58 PM

## 2016-05-02 NOTE — Progress Notes (Signed)
Physical Therapy Treatment Patient Details Name: Cassandra Bonilla MRN: [id number] DOB: 27-Jul-1941 Today's Date: 05/02/2016    History of Present Illness Very pleasant 75 yo female with onset of CHF and hypokalemia, on coumadin and home with husband.  Had been to SNF and then home about 2 weeks, now back to hospital.  PMHx:  CHF, CKD, COPD, CAD, DM, gout, a-fib    PT Comments    Pt performed increased gait with max cues for safety and positioning when transferring and ambulating.  Pt in good spirits during tx.  Husband is very helpful with chair follow.  Pt would continue to benefit from rehab in a post acute setting.    Follow Up Recommendations  Supervision for mobility/OOB;SNF     Equipment Recommendations  None recommended by PT    Recommendations for Other Services Rehab consult     Precautions / Restrictions Precautions Precautions: Fall Restrictions Weight Bearing Restrictions: No    Mobility  Bed Mobility Overal bed mobility: Needs Assistance Bed Mobility: Supine to Sit     Supine to sit: Mod assist     General bed mobility comments: Pt sleepy/lethargic on arrival, after sitting edge of bed able to maintain balance.    Transfers Overall transfer level: Needs assistance Equipment used: Rolling walker (2 wheeled) Transfers: Sit to/from Stand Sit to Stand: Mod assist Stand pivot transfers: Min assist       General transfer comment: Pt performed sit to stand with posterior push/lean.  Pt required cues for hand placement and forward weight shifting to improve ease of transfer.  Pt able to apply changes as tx progressed.    Ambulation/Gait Ambulation/Gait assistance: Mod assist;Min assist (initially mod and progressed to min.  ) Ambulation Distance (Feet): 30 Feet (+ 140 ft.  Pt required 3 standing breaks during 140 ft trial with noticeable DOE HR 103 bpm. ) Assistive device: Rolling walker (2 wheeled) Gait Pattern/deviations: Step-through pattern   Gait velocity  interpretation: Below normal speed for age/gender General Gait Details: Pt required assist for RW positioning/turns.  Pt with posterior lean.  Pt required cues for scapular retraction, forward gaze and increasing stride length.     Stairs            Wheelchair Mobility    Modified Rankin (Stroke Patients Only)       Balance Overall balance assessment: Needs assistance   Sitting balance-Leahy Scale: Fair       Standing balance-Leahy Scale: Poor                      Cognition Arousal/Alertness: Awake/alert Behavior During Therapy: WFL for tasks assessed/performed Overall Cognitive Status: Within Functional Limits for tasks assessed                      Exercises      General Comments        Pertinent Vitals/Pain Pain Assessment: Faces Pain Score: 3  Pain Location: initially when standing and began to urinate.   Pain Descriptors / Indicators: Moaning Pain Intervention(s): Monitored during session    Home Living                      Prior Function            PT Goals (current goals can now be found in the care plan section) Acute Rehab PT Goals Patient Stated Goal: get better Potential to Achieve Goals: Good Progress towards PT goals: Progressing toward  goals    Frequency  Min 3X/week    PT Plan Current plan remains appropriate    Co-evaluation             End of Session Equipment Utilized During Treatment: Gait belt Activity Tolerance: Patient limited by fatigue;Patient tolerated treatment well (fatigue dimished as treatment progressed.  ) Patient left: in chair;with call bell/phone within reach;with family/visitor present     Time: [id number] PT Time Calculation (min) (ACUTE ONLY): 33 min  Charges:  $Gait Training: 8-22 mins $Therapeutic Activity: 8-22 mins                    G Codes:      Cristela Blue 05/17/2016, 4:41 PM  Governor Rooks, PTA pager [phone]

## 2016-05-02 NOTE — Care Management Important Message (Signed)
Important Message  Patient Details  Name: Cassandra Bonilla MRN: [id number] Date of Birth: 02-05-1941   Medicare Important Message Given:  Yes    [name] May 05/02/2016, 11:39 AM

## 2016-05-02 NOTE — Progress Notes (Signed)
ANTICOAGULATION CONSULT NOTE - FOLLOW UP    HL = 0.55 (goal 0.3 - 0.7 units/mL) Heparin dosing weight = 68 kg   Assessment: [name] continues on IV Heparin for history of AFib.  Heparin level is therapeutic; no bleeding reported.   Plan: - Continue heparin gtt at 1000 units/hr - F/U AM labs - Monitor closely for bleeding post biopsy    Cassandra Bonilla, PharmD, BCPS 05/02/2016, 3:42 PM

## 2016-05-02 NOTE — Progress Notes (Signed)
This patient had liver Bx as scheduled yesterday.  Please have her follow up in the clinic with Dr Carlean Purl in 4-6 weeks to re-assess. See me progress note from yesterday.  Signing off - call as need arises.

## 2016-05-02 NOTE — Progress Notes (Signed)
[place] for Heparin Indication: atrial fibrillation  Allergies  Allergen Reactions  . Contrast Media  [Iodinated Diagnostic Agents] Hives  . Prednisone Other (See Comments)    Confusion    Patient Measurements: Height: 5\' 2"  (157.5 cm) Weight: 153 lb 3.2 oz (69.5 kg) IBW/kg (Calculated) : 50.1 Heparin Dosing Weight: 68 kg  Vital Signs: Temp: 99 F (37.2 C) (08/17 0549) Temp Source: Oral (08/17 0549) BP: 126/77 (08/17 0549) Pulse Rate: 93 (08/17 0549)  Labs:  Recent Labs  04/30/16 0524  04/30/16 2135 05/01/16 0709 05/02/16 0403  HGB  --   --   --  9.8* 9.1*  HCT  --   --   --  32.0* 30.0*  PLT  --   --   --  259 284  LABPROT 19.2*  --   --  19.0* 17.3*  INR 1.60  --   --  1.57 1.40  HEPARINUNFRC  --   < > 0.41 0.46 0.20*  CREATININE 3.42*  --   --  3.26*  --   < > = values in this interval not displayed.  Estimated Creatinine Clearance: 13.8 mL/min (by C-G formula based on SCr of 3.26 mg/dL).  Assessment: 75 year old female s/p liver biopsy 8/16, h/o Afib, for heparin  Goal of Therapy:  Heparin level 0.3-0.7 units/ml Monitor platelets by anticoagulation protocol: Yes   Plan:  Increase Heparin 1000 units/hr Check heparin level in 8 hours.   Phillis Knack, PharmD, BCPS  6:41 AM, 05/02/2016

## 2016-05-02 NOTE — Consult Note (Signed)
   [facility] CM Inpatient Consult   05/02/2016  Cassandra Bonilla August 21, 1941 [id number]   Chart review reveals patient is to discharge to a skilled nursing facility at discharge.  Per chart review Cassandra Bonilla a 75 y.o.femalewith a past medical history significant for Afib not on anticoagulation (due to anemia?), chronic systolic CHF EF [id number], anemia, CKD IV-V, and DMwho presents with itching but is found to have hypokalemia again. Patient has [employer] [facility].  Per chart history, the patient was ambulatory and able to climb "13 flights of stairs to our apartment" in [place] as recently as a year ago, but in February had a flare of HF and worsening of her renal function (previously baseline Cr high 1s, subsequently 2-3), and since then has been more debilitated, walking only with a walker. Two months ago she moved with her husband from [place], and was admitted with hypokalemia here 5 weeks ago.   Spoke with patient's husband, "Cassandra Bonilla" at the bedside regarding [place] management.  Per notes this is the 6th hospitalization this calandar year.  Cassandra Bonilla endorses Dr. Marco Collie as the primary care provider.  Encouraged him to call back after the patient's rehab, if he has additional questions or needs. Patient was sound asleep during the visit.  A brochure with contact information was provided.  For questions, please contact:  Natividad Brood, RN BSN [facility] Liaison  [phone] business mobile phone Toll free office [phone]

## 2016-05-02 NOTE — Progress Notes (Signed)
Foley cath removed per verbal order from Goodwin, MD. Pt tolerated well.

## 2016-05-03 DIAGNOSIS — Z7901 Long term (current) use of anticoagulants: Secondary | ICD-10-CM | POA: Diagnosis not present

## 2016-05-03 DIAGNOSIS — N184 Chronic kidney disease, stage 4 (severe): Secondary | ICD-10-CM | POA: Diagnosis not present

## 2016-05-03 DIAGNOSIS — E876 Hypokalemia: Secondary | ICD-10-CM | POA: Diagnosis not present

## 2016-05-03 DIAGNOSIS — R410 Disorientation, unspecified: Secondary | ICD-10-CM | POA: Diagnosis not present

## 2016-05-03 DIAGNOSIS — I120 Hypertensive chronic kidney disease with stage 5 chronic kidney disease or end stage renal disease: Secondary | ICD-10-CM | POA: Diagnosis not present

## 2016-05-03 DIAGNOSIS — Z794 Long term (current) use of insulin: Secondary | ICD-10-CM | POA: Diagnosis not present

## 2016-05-03 DIAGNOSIS — I482 Chronic atrial fibrillation: Secondary | ICD-10-CM | POA: Diagnosis not present

## 2016-05-03 DIAGNOSIS — N179 Acute kidney failure, unspecified: Secondary | ICD-10-CM | POA: Diagnosis not present

## 2016-05-03 DIAGNOSIS — N189 Chronic kidney disease, unspecified: Secondary | ICD-10-CM | POA: Diagnosis not present

## 2016-05-03 DIAGNOSIS — Z741 Need for assistance with personal care: Secondary | ICD-10-CM | POA: Diagnosis not present

## 2016-05-03 DIAGNOSIS — D631 Anemia in chronic kidney disease: Secondary | ICD-10-CM | POA: Diagnosis not present

## 2016-05-03 DIAGNOSIS — E1122 Type 2 diabetes mellitus with diabetic chronic kidney disease: Secondary | ICD-10-CM | POA: Diagnosis not present

## 2016-05-03 DIAGNOSIS — R262 Difficulty in walking, not elsewhere classified: Secondary | ICD-10-CM | POA: Diagnosis not present

## 2016-05-03 DIAGNOSIS — M6281 Muscle weakness (generalized): Secondary | ICD-10-CM | POA: Diagnosis not present

## 2016-05-03 DIAGNOSIS — I129 Hypertensive chronic kidney disease with stage 1 through stage 4 chronic kidney disease, or unspecified chronic kidney disease: Secondary | ICD-10-CM | POA: Diagnosis not present

## 2016-05-03 DIAGNOSIS — I5023 Acute on chronic systolic (congestive) heart failure: Secondary | ICD-10-CM | POA: Diagnosis not present

## 2016-05-03 LAB — BASIC METABOLIC PANEL
ANION GAP: 12 (ref 5–15)
BUN: 65 mg/dL — AB (ref 6–20)
CALCIUM: 8.5 mg/dL — AB (ref 8.9–10.3)
CO2: 25 mmol/L (ref 22–32)
Chloride: 101 mmol/L (ref 101–111)
Creatinine, Ser: 3.1 mg/dL — ABNORMAL HIGH (ref 0.44–1.00)
GFR calc Af Amer: 16 mL/min — ABNORMAL LOW (ref >60)
GFR calc non Af Amer: 14 mL/min — ABNORMAL LOW (ref >60)
GLUCOSE: 199 mg/dL — AB (ref 65–99)
Potassium: 5 mmol/L (ref 3.5–5.1)
Sodium: 138 mmol/L (ref 135–145)

## 2016-05-03 LAB — CBC
HEMATOCRIT: 30.2 % — AB (ref 36.0–46.0)
Hemoglobin: 9.1 g/dL — ABNORMAL LOW (ref 12.0–15.0)
MCH: 26.6 pg (ref 26.0–34.0)
MCHC: 30.1 g/dL (ref 30.0–36.0)
MCV: 88.3 fL (ref 78.0–100.0)
Platelets: 274 10*3/uL (ref 150–400)
RBC: 3.42 MIL/uL — ABNORMAL LOW (ref 3.87–5.11)
RDW: 18 % — AB (ref 11.5–15.5)
WBC: 5.8 10*3/uL (ref 4.0–10.5)

## 2016-05-03 LAB — PROTIME-INR
INR: 1.33
Prothrombin Time: 16.6 seconds — ABNORMAL HIGH (ref 11.4–15.2)

## 2016-05-03 LAB — GLUCOSE, CAPILLARY
Glucose-Capillary: 252 mg/dL — ABNORMAL HIGH (ref 65–99)
Glucose-Capillary: 261 mg/dL — ABNORMAL HIGH (ref 65–99)

## 2016-05-03 LAB — HEPARIN LEVEL (UNFRACTIONATED): Heparin Unfractionated: 0.57 IU/mL (ref 0.30–0.70)

## 2016-05-03 MED ORDER — RIFAXIMIN 200 MG PO TABS: 400.0000 mg | ORAL_TABLET | Freq: Three times a day (TID) | ORAL | 1 refills | Status: AC

## 2016-05-03 MED ORDER — METOPROLOL SUCCINATE ER 50 MG PO TB24: 50.0000 mg | ORAL_TABLET | Freq: Every day | ORAL | 1 refills | Status: DC

## 2016-05-03 MED ORDER — LACTULOSE 10 GM/15ML PO SOLN: 20.0000 g | Freq: Two times a day (BID) | ORAL | 0 refills | Status: DC

## 2016-05-03 MED ORDER — WARFARIN SODIUM 5 MG PO TABS: 5.0000 mg | ORAL_TABLET | Freq: Once | ORAL | Status: DC

## 2016-05-03 MED ORDER — LACTULOSE 10 GM/15ML PO SOLN: 20.0000 g | Freq: Two times a day (BID) | ORAL | Status: DC

## 2016-05-03 MED ORDER — FUROSEMIDE 40 MG PO TABS: 120.0000 mg | ORAL_TABLET | Freq: Two times a day (BID) | ORAL | 1 refills | Status: DC

## 2016-05-03 MED ORDER — WARFARIN SODIUM 4 MG PO TABS: 4.0000 mg | ORAL_TABLET | Freq: Every day | ORAL | 1 refills | Status: DC

## 2016-05-03 MED ORDER — WARFARIN SODIUM 4 MG PO TABS
4.0000 mg | ORAL_TABLET | Freq: Once | ORAL | Status: AC
  Administered 2016-05-03: 4 mg via ORAL
  Filled 2016-05-03 (×2): qty 1

## 2016-05-03 MED ORDER — WARFARIN - PHARMACIST DOSING INPATIENT: Freq: Every day | Status: DC

## 2016-05-03 MED ORDER — ENOXAPARIN SODIUM 80 MG/0.8ML ~~LOC~~ SOLN: 70.0000 mg | SUBCUTANEOUS | 0 refills | Status: DC

## 2016-05-03 MED ORDER — ENOXAPARIN SODIUM 80 MG/0.8ML ~~LOC~~ SOLN
70.0000 mg | SUBCUTANEOUS | Status: DC
  Administered 2016-05-03: 70 mg via SUBCUTANEOUS
  Filled 2016-05-03: qty 0.8

## 2016-05-03 MED ORDER — WARFARIN SODIUM 4 MG PO TABS: 4.0000 mg | ORAL_TABLET | Freq: Every day | ORAL | Status: DC

## 2016-05-03 MED ORDER — POTASSIUM CHLORIDE CRYS ER 20 MEQ PO TBCR: 40.0000 meq | EXTENDED_RELEASE_TABLET | Freq: Every day | ORAL | 1 refills | Status: AC

## 2016-05-03 NOTE — Care Management Note (Signed)
Case Management Note  Patient Details  Name: Cassandra Bonilla MRN: [id number] Date of Birth: 06/01/1941  Subjective/Objective:    Admitted with hypokalemia.              Action/Plan: Plan is to d/c to [facility] today.   Expected Discharge Date:     05/03/2016           Expected Discharge Plan:    In-House Referral:     Discharge planning Services  CM Consult    Status of Service:  Completed, signed off  Additional Comments:  Sharin Mons, RN 05/03/2016, 11:57 AM

## 2016-05-03 NOTE — Progress Notes (Signed)
Patient will DC to: [facility] Anticipated DC date: 05/03/16 Family notified: Spouse at bedside Transport by: Spouse will transport by car   Per MD patient ready for DC to [facility]. RN, patient, patient's family, and facility notified of DC. RN given number for report.  CSW signing off.  Cedric Fishman, [employer] Social Worker [phone]

## 2016-05-03 NOTE — Progress Notes (Addendum)
[place] for Heparin > switch to Lovenox for discharge, resume warfarin Indication: atrial fibrillation  Allergies  Allergen Reactions  . Contrast Media  [Iodinated Diagnostic Agents] Hives  . Prednisone Other (See Comments)    Confusion    Patient Measurements: Height: 5\' 2"  (157.5 cm) Weight: 153 lb 11.2 oz (69.7 kg) IBW/kg (Calculated) : 50.1 Heparin Dosing Weight: 68 kg  Vital Signs: Temp: 98.3 F (36.8 C) (08/18 0705) Temp Source: Oral (08/18 0705) BP: 103/62 (08/18 0705) Pulse Rate: 89 (08/18 0705)  Labs:  Recent Labs  05/01/16 0709 05/02/16 0403 05/02/16 1432 05/03/16 0557  HGB 9.8* 9.1*  --  9.1*  HCT 32.0* 30.0*  --  30.2*  PLT 259 284  --  274  LABPROT 19.0* 17.3*  --  16.6*  INR 1.57 1.40  --  1.33  HEPARINUNFRC 0.46 0.20* 0.55 0.57  CREATININE 3.26* 3.19*  --  3.10*    Estimated Creatinine Clearance: 14.6 mL/min (by C-G formula based on SCr of 3.1 mg/dL).  Assessment: 75 year old female s/p liver biopsy 8/16, h/o Afib, on IV heparin while PTA Coumadin is on hold.  No bleeding or complications noted.  Heparin level at goal this AM.  Spoke to Dr. Coralyn Pear, will switch to full-dose renally adjusted Lovenox today in anticipation of discharge.  Also okay to resume Coumadin.  No recent Coumadin PTA.    Goal of Therapy:  INR 2-3 Monitor platelets by anticoagulation protocol: Yes   Plan:  D/C IV heparin (turned off ~ 1010 AM) Start Lovenox 70 mg sq q 24 hrs (first dose given at 1115 AM) Coumadin 4 mg x 1 prior to discharge; then recommend 4 mg daily at discharge until next INR check.  Uvaldo Rising, BCPS  Clinical Pharmacist Pager [phone]  05/03/2016 11:26 AM

## 2016-05-03 NOTE — Discharge Summary (Signed)
Physician Discharge Summary  Cassandra Bonilla IYM:[id number] DOB: July 24, 1941 DOA: 04/20/2016  PCP: Marco Collie, MD  Admit date: 04/20/2016 Discharge date: 05/03/2016  Time spent: 35 minutes  Recommendations for Outpatient Follow-up:  1. Please follow up on PT/INR in 2 days on 05/05/2016, she is on anticoagulation for Afib. Had INR of 1.33 on day of discharge.  2. Discharging on Lovenox bridging 70 mg [place] daily for 5 days 3. She was discharged on Warfarin 4 mg PO q daily, recommend a pharmacy consult at the facility for warfarin management.  4. BMP in 2-3 days, she was admitted for AKI and hypokalemia, had Cr of 3.1 on day of discharge 5. Please follow up on volume status, she was discharged on lasix 120 mg PO BID 6. Follow up on Liver pathology, s/p liver biopsy during this hospitalization    Discharge Diagnoses:  Principal Problem:   Hypokalemia Active Problems:   Anemia of chronic disease   Chronic atrial fibrillation (HCC)   Type 2 diabetes mellitus with stage 4 chronic kidney disease, with long-term current use of insulin (HCC)   Acute on chronic systolic (congestive) heart failure (HCC)   CKD (chronic kidney disease), stage IV (HCC)   Fluid overload   Delirium   Acute on chronic renal failure (HCC)   Abnormal alkaline phosphatase test   Discharge Condition: Stable  Diet recommendation: Low salt heart healthy diet  Filed Weights   05/02/16 0549 05/02/16 2131 05/03/16 0752  Weight: 69.5 kg (153 lb 3.2 oz) 70.2 kg (154 lb 12.2 oz) 69.7 kg (153 lb 11.2 oz)    History of present illness:  Cassandra Bonilla is a 75 y.o. female with a past medical history significant for Afib not on anticoagulation, chronic systolic CHF EF 76%, anemia, CKD IV-V, and DM who presents with itching but is found to have hypokalemia again.  Today, she presented to the ER actually just with pruritis not responding to her home Atarax.  Knows this is from uremia, but wanted relief.  Had also noted weight gain and  worse leg swelling, but no change in respiratory symptoms, dyspnea, cough, chest discomfort, urine output.  Hospital Course:   Acute on Chronic systolic and right sided CHF: From CKD and right sided and systolic CHF EF 80-88% in May at last echocardiogram in [place]. Pulmonary HTN.  Not on lisinopril because of renal insufficiency.  -Echo performed 04/21/2016 showed  EF 35 %  -On 05/03/2016 she has a net negative fluid balance of 12.4 L to date -She was discharged on Lasix 120 mg PO BID, please follow up on volume status  Acute on  chronic renal insufficiency:CKD IV-V: -Lab work showing downward trend in creatinine, today at 3.19, down from 3.9 on 04/28/2016 -Please follow up on BMP in 2-3 days, she was discharged on Lasix 120 mg PO BID  Encephalopathy, acute;  -Family members reporting that at baseline she is confused, suspect has dementia. -Worsening mental status changes likely multifactorial, with acute on chronic renal failure, hyperammonemia, hospitalization, all probable contributors -Ammonia level at 80 during this hospitalization -By 05/03/2016 mental status changes improved. -She was discharged on Lactulose and Rifaximin   Elevated alk phos, elevated ammonia level; Pruritus:  [name] cholecystectomy and underline hepatic inflammatory changes.  Ammonia remain elevated despite lactulose, start rifaximin  Alk phosphatase elevated, pruritus. ANA mildly elevated, mitochondrial antibodies negative.   viral hepatitis panel negative Denies abdominal pain.  GI consulted she underwent liver biopsy on 05/01/2016, awaiting pathology Has follow up appointment with GI  Atrial fibrillation: Boon consult at facility for warfarin dosing -She was discharged on Lovenox bridge x 5 days, with warfarin dosed at 4 mg PO q daily -Repeat PT/INR in 2-3 day  Hypokalemia: Lab work on 05/01/2016 showed potassium of 4.5 -Discharged on Kdur 40 meq PO q  daily. She is on Lasix.  -Repeat BMP 2-3 days  IDDM: - home glargine held initially, restarted on 8/11 5units qham -Sliding scale corrections, renal dose  Coronary artery disease and HTN: History of CABG, PCI 2, CEA. -Continue asa, amlodipine, furosemide, metolazone, metorpolol, and statin  Left leg pain, left shoulder pain ,  patient is not very specific with the location of the pain, report entire leg hurts, lumber /left hip/left knee x ray/ left shoulder x ray unremarkable, Unfortunately opioid is not an option for pain control while she is on  naltraxone for pruritis. She is not a candidate for NSAIDs due to renal impairment, only option for pain is tylenol for now,   Will try flexeril prn , add neurontin qhs I have discussed with gi  Dr Lorayne Bender who oked to d/c naltraxone, now she can have percocet prn for pain if flexeril/neurontin not effective  Procedures:  Liver Biopsy performed on 05/01/2016  Consultations:  Cardiology  Nephrology   GI  Discharge Exam: Vitals:   05/02/16 2131 05/03/16 0705  BP: 105/72 103/62  Pulse: 87 89  Resp: 17 17  Temp: 98.4 F (36.9 C) 98.3 F (36.8 C)    General exam: Awake and alert Respiratory system: Normal inspiratory effort, few bibasilar crackles Cardiovascular system: S1 & S2 heard, RRR. No JVD, murmurs, rubs, gallops or clicks- edema to thighs Gastrointestinal system: Abdomen is nondistended, soft and nontender. No organomegaly or masses felt. Normal bowel sounds heard. Extremities: moves all 4 ext, decreasing edema bilateral lower extremities, legs start to wrinkle  Discharge Instructions   Discharge Instructions    Call MD for:    Complete by:  As directed   Call MD for:  difficulty breathing, headache or visual disturbances    Complete by:  As directed   Call MD for:  extreme fatigue    Complete by:  As directed   Call MD for:  hives    Complete by:  As directed   Call MD for:  persistant dizziness or  light-headedness    Complete by:  As directed   Call MD for:  persistant nausea and vomiting    Complete by:  As directed   Call MD for:  redness, tenderness, or signs of infection (pain, swelling, redness, odor or green/yellow discharge around incision site)    Complete by:  As directed   Call MD for:  severe uncontrolled pain    Complete by:  As directed   Call MD for:  temperature >100.4    Complete by:  As directed   Diet - low sodium heart healthy    Complete by:  As directed   Increase activity slowly    Complete by:  As directed     Current Discharge Medication List    START taking these medications   Details  enoxaparin (LOVENOX) 80 MG/0.8ML injection Inject 0.7 mLs (70 mg total) into the skin daily. Qty: 5 Syringe, Refills: 0    lactulose (CHRONULAC) 10 GM/15ML solution Take 30 mLs (20 g total) by mouth 2 (two) times daily. Qty: 240 mL, Refills: 0    metoprolol succinate (TOPROL-XL) 50 MG 24 hr tablet Take 1 tablet (50  mg total) by mouth daily. Take with or immediately following a meal. Qty: 30 tablet, Refills: 1    potassium chloride SA (K-DUR,KLOR-CON) 20 MEQ tablet Take 2 tablets (40 mEq total) by mouth daily. Qty: 30 tablet, Refills: 1    rifaximin (XIFAXAN) 200 MG tablet Take 2 tablets (400 mg total) by mouth 3 (three) times daily. Qty: 30 tablet, Refills: 1    warfarin (COUMADIN) 4 MG tablet Take 1 tablet (4 mg total) by mouth daily at 6 PM. Qty: 30 tablet, Refills: 1      CONTINUE these medications which have CHANGED   Details  furosemide (LASIX) 40 MG tablet Take 3 tablets (120 mg total) by mouth 2 (two) times daily. Qty: 30 tablet, Refills: 1      CONTINUE these medications which have NOT CHANGED   Details  acetaminophen (TYLENOL) 650 MG CR tablet Take 1,300 mg by mouth at bedtime as needed for pain.    allopurinol (ZYLOPRIM) 100 MG tablet Take 100 mg by mouth daily.    amLODipine (NORVASC) 10 MG tablet Take 10 mg by mouth daily.    aspirin EC 81 MG  tablet Take 81 mg by mouth daily.    atorvastatin (LIPITOR) 40 MG tablet Take 40 mg by mouth daily.    guaiFENesin-codeine (ROBITUSSIN AC) 100-10 MG/5ML syrup Take 10 mLs by mouth 3 (three) times daily as needed for cough.    hydrOXYzine (ATARAX/VISTARIL) 25 MG tablet Take 1-2 tablets (25-50 mg total) by mouth every 6 (six) hours as needed for itching. Qty: 30 tablet, Refills: 0    insulin glargine (LANTUS) 100 UNIT/ML injection Inject 5 Units into the skin every morning.     insulin lispro (HUMALOG) 100 UNIT/ML injection Inject 8-10 Units into the skin at bedtime. Takes 8 units if CBG <400, takes 10 units if CBG >400     metolazone (ZAROXOLYN) 5 MG tablet Take 5 mg by mouth daily.     pantoprazole (PROTONIX) 40 MG tablet Take 40 mg by mouth daily.       STOP taking these medications     polyethylene glycol (MIRALAX / GLYCOLAX) packet        Allergies  Allergen Reactions  . Contrast Media  [Iodinated Diagnostic Agents] Hives  . Prednisone Other (See Comments)    Confusion   Follow-up Information    Amy Esterwood, PA-C Follow up on 06/11/2016.   Specialty:  Gastroenterology Why:  9AM with Dr Celesta Aver PA for liver follow up. the appointments with Dr Loletha Carrow of 9/6, and colonoscopy of 9/20 have been cancelled.  Contact information: [place] [address] [phone]        HODGES,BETH, MD Follow up in 2 week(s).   Specialty:  Family Medicine Contact information: [place]. [place] [address] [phone]            The results of significant diagnostics from this hospitalization (including imaging, microbiology, ancillary and laboratory) are listed below for reference.    Significant Diagnostic Studies: Dg Chest 2 View  Result Date: 04/28/2016 CLINICAL DATA:  Productive cough today. No shortness of breath or chest pain. EXAM: CHEST  2 VIEW COMPARISON:  04/25/2016 FINDINGS: Stable changes from cardiac surgery. Cardiac silhouette is  mildly enlarged. No mediastinal or hilar masses. Small pleural effusions. Streaky opacity at the lung bases most likely atelectasis. No convincing pneumonia or pulmonary edema. No pneumothorax. The bony thorax is demineralized but grossly intact. IMPRESSION: 1. No significant change from the prior study.  2. Cardiomegaly with small effusions but no convincing pulmonary edema. 3. Streaky opacity at the lung bases most likely atelectasis. No convincing pneumonia. Electronically Signed   By: Lajean Manes M.D.   On: 04/28/2016 13:22   Dg Chest 2 View  Result Date: 04/25/2016 CLINICAL DATA:  Pt states she is here for pneumonia, has a very productive cough, , hx htn, chf, copd, , diabetes EXAM: CHEST  2 VIEW COMPARISON:  04/21/2016 FINDINGS: Changes from previous cardiac surgery stable. Cardiac silhouette is mildly enlarged. No mediastinal or hilar masses or evidence of adenopathy. There is streaky opacity at the lung bases. Lungs otherwise clear. There is a small left pleural effusion. No convincing pulmonary edema. No pneumothorax. Bony thorax is demineralized but grossly intact. IMPRESSION: 1. Streaky lung base opacity best seen on the lateral view, most likely due to atelectasis. Pneumonia is not excluded but felt less likely. 2. Cardiomegaly and small left pleural effusion, but no evidence of pulmonary edema. Electronically Signed   By: Lajean Manes M.D.   On: 04/25/2016 18:38   Dg Chest 2 View  Result Date: 04/21/2016 CLINICAL DATA:  75 year old female with history of acute on chronic systolic heart failure. EXAM: CHEST  2 VIEW COMPARISON:  Chest x-ray 03/22/2016. FINDINGS: Lung volumes are low. There is cephalization of the pulmonary vasculature and slight indistinctness of the interstitial markings suggestive of mild pulmonary edema. Small bilateral pleural effusions. Mild cardiomegaly. Upper mediastinal contours are within normal limits. Aortic atherosclerosis. Status post median sternotomy. Calcified  granuloma projecting over the right upper lobe. IMPRESSION: 1. Persistent evidence of congestive heart failure, as above. 2. Aortic atherosclerosis. Electronically Signed   By: Vinnie Langton M.D.   On: 04/21/2016 15:14   Dg Knee 1-2 Views Left  Result Date: 04/27/2016 CLINICAL DATA:  Left lower extremity pain.  No reported injury. EXAM: LEFT KNEE - 1-2 VIEW COMPARISON:  None. FINDINGS: No fracture, significant joint effusion, dislocation or suspicious focal osseous lesion. Small marginal osteophyte at the inferior patella. Extensive vascular calcifications in the posterior soft tissues. Surgical clips in the medial soft tissues. IMPRESSION: No fracture, joint effusion or malalignment. Mild degenerative changes at the inferior patella. Electronically Signed   By: Ilona Sorrel M.D.   On: 04/27/2016 12:46   Ct Head Wo Contrast  Result Date: 04/29/2016 CLINICAL DATA:  75 year old female with a history of confusion EXAM: CT HEAD WITHOUT CONTRAST TECHNIQUE: Contiguous axial images were obtained from the base of the skull through the vertex without intravenous contrast. COMPARISON:  None. FINDINGS: Unremarkable appearance of the calvarium without acute fracture or aggressive lesion. Unremarkable appearance of the scalp soft tissues. Unremarkable appearance of the bilateral orbits. Air-fluid level of the right mastoid air cells which are partially opacified. Fluid extends into the middle ear. Fluid density involving the features apex, just anterior to the cochlea. No significant paranasal sinus disease No acute intracranial hemorrhage. No midline shift or mass effect. Unremarkable configuration the ventricles. Gray-white differentiation maintained. Calcifications of the intracranial vasculature. IMPRESSION: No CT evidence of acute intracranial abnormality. Partially opacified right-sided mastoid air cells with air-fluid level, potentially representing mastoiditis. Right middle ear effusion. Intracranial  atherosclerosis. Signed, Dulcy Fanny. Earleen Newport, DO [facility] Electronically Signed   By: Corrie Mckusick D.O.   On: 04/29/2016 16:04   Dg Lumbar Spine 1 View  Result Date: 04/27/2016 CLINICAL DATA:  Back pain EXAM: LUMBAR SPINE - 1 VIEW COMPARISON:  None FINDINGS: Single AP view of the lumbar spine.  No lateral view for correlation. Marked osseous demineralization. Five lumbar vertebra. Facet degenerative changes lower lumbar spine. No gross acute lumbar spine abnormalities identified on limited AP exam. Scattered atherosclerotic calcifications and pelvic phleboliths. Surgical clips RIGHT upper quadrant question cholecystectomy. IMPRESSION: Osseous demineralization with degenerative facet disease changes lower lumbar spine. No gross acute lumbar spine abnormality identified on single AP view. Aortic atherosclerosis. Electronically Signed   By: Lavonia Dana M.D.   On: 04/27/2016 12:47   US Renal  Result Date: 04/24/2016 CLINICAL DATA:  Stage 4 chronic kidney disease. EXAM: RENAL / URINARY TRACT ULTRASOUND COMPLETE COMPARISON:  April 22, 2016 complete abdomen ultrasound FINDINGS: Right Kidney: Length: 10.5 cm. There is diffuse increased echotexture of the right kidney. No mass or hydronephrosis visualized. Left Kidney: Length: 10.6 cm. There is diffuse increased echotexture of the left kidney. No mass or hydronephrosis visualized. Bladder: Not well seen with Foley catheter in place. There is incidental finding of right pleural effusion and ascites. IMPRESSION: No hydronephrosis identified bilaterally. Bilateral diffuse increased echotexture of the kidneys consistent with medical renal disease. Right pleural effusion and ascites. Electronically Signed   By: Abelardo Diesel M.D.   On: 04/24/2016 13:07   US Abdomen Limited  Result Date: 04/22/2016 CLINICAL DATA:  Elevated liver enzymes EXAM: US ABDOMEN LIMITED - RIGHT UPPER QUADRANT COMPARISON:  None. FINDINGS:  Gallbladder: Surgically removed Common bile duct: Diameter: 2.3 mm. Liver: No focal mass lesion is noted. Echogenic portal venous walls are seen. This may be related to a component of underlying hepatitis. Bidirectional flow in the portal vein is noted. IMPRESSION: Status post cholecystectomy. Changes suggestive of underlying hepatic inflammatory change. Electronically Signed   By: Inez Catalina M.D.   On: 04/22/2016 16:04   US Biopsy  Result Date: 05/01/2016 INDICATION: 75 year old female with a history of liver disease. She has been referred for medical liver biopsy EXAM: ULTRASOUND BIOPSY CORE LIVER MEDICATIONS: None. ANESTHESIA/SEDATION: Moderate (conscious) sedation was employed during this procedure. A total of Versed 1.0 mg and Fentanyl 50 mcg was administered intravenously. Moderate Sedation Time: 15 minutes. The patient's level of consciousness and vital signs were monitored continuously by radiology nursing throughout the procedure under my direct supervision. FLUOROSCOPY TIME:  None COMPLICATIONS: None PROCEDURE: Informed written consent was obtained from the patient after a thorough discussion of the procedural risks, benefits and alternatives. All questions were addressed. Maximal Sterile Barrier Technique was utilized including caps, mask, sterile gowns, sterile gloves, sterile drape, hand hygiene and skin antiseptic. A timeout was performed prior to the initiation of the procedure. Patient is positioned supine position on the ultrasound table. Ultrasound images were performed of the upper abdomen. Patient is an prepped and draped in the usual sterile fashion. The skin and subcutaneous tissues were generously infiltrated 1% lidocaine for local anesthesia. Small stab incision was made with 11 blade scalpel. Using ultrasound guidance, a 12 gauge guide needle was advanced through the tissues, in a region where the ascites was not separating the body wall from the liver margin into the right liver lobe.  Three separate 18 gauge core biopsy achieved. Three Gel-Foam pledgets were then infused. Needle was removed and a final image was stored. Patient tolerated the procedure well and remained hemodynamically stable throughout. No complications were encountered and no significant blood loss. IMPRESSION: Status post ultrasound-guided biopsy of right liver lobe for medical liver purpose. Tissue specimen sent to pathology for complete histopathologic analysis. Signed, Dulcy Fanny. Earleen Newport, DO [facility] Electronically Signed  By: Corrie Mckusick D.O.   On: 05/01/2016 16:39   Dg Shoulder Left  Result Date: 04/30/2016 CLINICAL DATA:  Body aches and left shoulder joint region discomfort. No known injury, history of gout. EXAM: LEFT SHOULDER - 2+ VIEW COMPARISON:  Chest x-ray of April 28, 2016 which included portions of the left shoulder. FINDINGS: The bones are subjectively osteopenic. The glenohumeral joint space is preserved. There is mild inferior articular margin spurring of the glenoid. The [facility] joint exhibits mild narrowing with some acromial articular surface irregularity. The subacromial subdeltoid space is preserved. The observed portions of the left clavicle and upperleft ribs are normal. IMPRESSION: There is no acute bony abnormality of the left shoulder. Mild degenerative change of the inferior rim of the glenoid and of the [facility] joint is present. Electronically Signed   By: David  Martinique M.D.   On: 04/30/2016 08:46   Dg Hip Unilat With Pelvis 2-3 Views Left  Result Date: 04/27/2016 CLINICAL DATA:  Hip pain EXAM: DG HIP (WITH OR WITHOUT PELVIS) 2-3V LEFT COMPARISON:  None FINDINGS: Osseous demineralization. Hip and SI joint spaces symmetric and preserved. No acute fracture, dislocation, or bone destruction. Extensive atherosclerotic calcification. Calcified pelvic phleboliths. IMPRESSION: No acute osseous abnormalities. Electronically Signed   By: Lavonia Dana M.D.   On: 04/27/2016 12:49    Microbiology: Recent Results (from the past 240 hour(s))  MRSA PCR Screening     Status: None   Collection Time: 04/28/16  8:35 PM  Result Value Ref Range Status   MRSA by PCR NEGATIVE NEGATIVE Final    Comment:        The GeneXpert MRSA Assay (FDA approved for NASAL specimens only), is one component of a comprehensive MRSA colonization surveillance program. It is not intended to diagnose MRSA infection nor to guide or monitor treatment for MRSA infections.      Labs: Basic Metabolic Panel:  Recent Labs Lab 04/27/16 0658  04/29/16 0554 04/30/16 0524 05/01/16 0709 05/02/16 0403 05/03/16 0557  NA 136  < > 135 138 139 138 138  K 4.3  < > 4.5 4.2 4.5 4.4 5.0  CL 97*  < > 99* 98* 101 101 101  CO2 26  < > _0 GLUCOSE 100*  < > 190* 99 133* 227* 199*  BUN 85*  < > 78* 71* 69* 66* 65*  CREATININE 3.82*  < > 3.58* 3.42* 3.26* 3.19* 3.10*  CALCIUM 8.5*  < > 8.4* 8.6* 8.6* 8.2* 8.5*  PHOS 4.9*  --   --   --   --   --   --   < > = values in this interval not displayed. Liver Function Tests:  Recent Labs Lab 04/28/16 0734 04/29/16 0554 04/30/16 0524 05/01/16 0709 05/02/16 0403  AST _1 ALT 13* 12* 14 13* 13*  ALKPHOS 241* 227* 238* 225* 196*  BILITOT 1.2 0.9 1.1 1.0 0.7  PROT 6.9 6.8 7.2 6.8 6.5  ALBUMIN 2.6* 2.5* 2.7* 2.6* 2.5*   No results for input(s): LIPASE, AMYLASE in the last 168 hours.  Recent Labs Lab 04/28/16 0454 04/29/16 0554 04/30/16 0524 05/01/16 0709 05/02/16 0403  AMMONIA 74* 72* 46* 39* 80*   CBC:  Recent Labs Lab 04/27/16 0658 04/29/16 0554 05/01/16 0709 05/02/16 0403 05/03/16 0557  WBC 5.3 4.5 4.3 4.8 5.8  HGB 9.8* 9.7* 9.8* 9.1* 9.1*  HCT 31.3* 31.4* 32.0* 30.0* 30.2*  MCV 86.7 89.7 89.6 89.6 88.3  PLT 286 254 259 284 274   Cardiac Enzymes: No results for input(s): CKTOTAL, CKMB, CKMBINDEX, TROPONINI in the last 168 hours. BNP: BNP (last 3 results)  Recent  Labs  04/20/16 2127 04/24/16 1642 04/25/16 0741  BNP 2,581.1* 3,497.9* 3,143.2*    ProBNP (last 3 results) No results for input(s): PROBNP in the last 8760 hours.  CBG:  Recent Labs Lab 05/02/16 0752 05/02/16 1157 05/02/16 1724 05/02/16 2018 05/03/16 0752  GLUCAP 300* 282* 127* 144* 252*       Signed:  Kelvin Cellar MD.  [facility] 05/03/2016, 11:35 AM

## 2016-05-03 NOTE — Progress Notes (Signed)
Physical Therapy Treatment Patient Details Name: Cassandra Bonilla MRN: [id number] DOB: [birth year] Today's Date: 05/03/2016    History of Present Illness Very pleasant 75 yo female with onset of CHF and hypokalemia, on coumadin and home with husband.  Had been to SNF and then home about 2 weeks, now back to hospital.  PMHx:  CHF, CKD, COPD, CAD, DM, gout, a-fib    PT Comments    Pt performed decreased mobility secondary to lethargy and incontinence in halls.  Pt to d/c to SNF today to improve strength and promote functional independence.    Follow Up Recommendations  Supervision for mobility/OOB;SNF     Equipment Recommendations  None recommended by PT    Recommendations for Other Services Rehab consult     Precautions / Restrictions Precautions Precautions: Fall Restrictions Weight Bearing Restrictions: No    Mobility  Bed Mobility Overal bed mobility: Needs Assistance Bed Mobility: Supine to Sit     Supine to sit: Min guard     General bed mobility comments: Pt sleeping on arrival, required cues for hand placement and LE advancement to edge of bed.    Transfers Overall transfer level: Needs assistance Equipment used: Rolling walker (2 wheeled) Transfers: Sit to/from Stand Sit to Stand: Mod assist Stand pivot transfers: Min assist       General transfer comment: Pt with improved forward weight shifting during sit to stand, required cues for hand placement to push from seated surface.  Pt perseverated on itching and having stuff in her ears.  C/o mild dizziness in standing.    Ambulation/Gait Ambulation/Gait assistance: Min assist;Mod assist Ambulation Distance (Feet): 30 Feet (+ 80 ft, gt terminated due to BM in hall.  ) Assistive device: Rolling walker (2 wheeled)   Gait velocity: very slow   General Gait Details: Pt required assist for RW positioning/turns.  Pt with posterior lean.  Pt required cues for scapular retraction, forward gaze and increasing stride  length.     Stairs            Wheelchair Mobility    Modified Rankin (Stroke Patients Only)       Balance Overall balance assessment: Needs assistance   Sitting balance-Leahy Scale: Fair       Standing balance-Leahy Scale: Poor                      Cognition Arousal/Alertness: Awake/alert Behavior During Therapy: WFL for tasks assessed/performed Overall Cognitive Status: Within Functional Limits for tasks assessed                      Exercises      General Comments        Pertinent Vitals/Pain Pain Assessment: No/denies pain    Home Living                      Prior Function            PT Goals (current goals can now be found in the care plan section) Acute Rehab PT Goals Patient Stated Goal: get better Potential to Achieve Goals: Good Progress towards PT goals: Progressing toward goals    Frequency  Min 3X/week    PT Plan Current plan remains appropriate    Co-evaluation             End of Session Equipment Utilized During Treatment: Gait belt Activity Tolerance: Patient limited by fatigue;Patient tolerated treatment well Patient left: in chair;with call bell/phone  within reach;with family/visitor present     Time: 1442-1510 PT Time Calculation (min) (ACUTE ONLY): 28 min  Charges:  $Gait Training: 8-22 mins $Therapeutic Activity: 8-22 mins                    G Codes:      Cristela Blue 2016-06-01, 3:16 PM  Governor Rooks, PTA pager [phone]

## 2016-05-03 NOTE — Progress Notes (Signed)
Nsg Discharge Note  Admit Date:  04/20/2016 Discharge date: 05/03/2016   Tomoe Engel to be D/C'd Rehab per MD order.  AVS completed.  Copy for chart, and copy for patient signed, and dated. Patient/caregiver able to verbalize understanding.  Discharge Medication:   Medication List    STOP taking these medications   polyethylene glycol packet Commonly known as:  MIRALAX / GLYCOLAX     TAKE these medications   acetaminophen 650 MG CR tablet Commonly known as:  TYLENOL Take 1,300 mg by mouth at bedtime as needed for pain.   allopurinol 100 MG tablet Commonly known as:  ZYLOPRIM Take 100 mg by mouth daily.   amLODipine 10 MG tablet Commonly known as:  NORVASC Take 10 mg by mouth daily.   aspirin EC 81 MG tablet Take 81 mg by mouth daily.   atorvastatin 40 MG tablet Commonly known as:  LIPITOR Take 40 mg by mouth daily.   enoxaparin 80 MG/0.8ML injection Commonly known as:  LOVENOX Inject 0.7 mLs (70 mg total) into the skin daily.   furosemide 40 MG tablet Commonly known as:  LASIX Take 3 tablets (120 mg total) by mouth 2 (two) times daily. What changed:  medication strength  how much to take   guaiFENesin-codeine 100-10 MG/5ML syrup Commonly known as:  ROBITUSSIN AC Take 10 mLs by mouth 3 (three) times daily as needed for cough.   hydrOXYzine 25 MG tablet Commonly known as:  ATARAX/VISTARIL Take 1-2 tablets (25-50 mg total) by mouth every 6 (six) hours as needed for itching. What changed:  when to take this  additional instructions   insulin glargine 100 UNIT/ML injection Commonly known as:  LANTUS Inject 5 Units into the skin every morning.   insulin lispro 100 UNIT/ML injection Commonly known as:  HUMALOG Inject 8-10 Units into the skin at bedtime. Takes 8 units if CBG <400, takes 10 units if CBG >400   lactulose 10 GM/15ML solution Commonly known as:  CHRONULAC Take 30 mLs (20 g total) by mouth 2 (two) times daily.   metolazone 5 MG  tablet Commonly known as:  ZAROXOLYN Take 5 mg by mouth daily.   metoprolol succinate 50 MG 24 hr tablet Commonly known as:  TOPROL-XL Take 1 tablet (50 mg total) by mouth daily. Take with or immediately following a meal.   pantoprazole 40 MG tablet Commonly known as:  PROTONIX Take 40 mg by mouth daily.   potassium chloride SA 20 MEQ tablet Commonly known as:  K-DUR,KLOR-CON Take 2 tablets (40 mEq total) by mouth daily.   rifaximin 200 MG tablet Commonly known as:  XIFAXAN Take 2 tablets (400 mg total) by mouth 3 (three) times daily.   warfarin 4 MG tablet Commonly known as:  COUMADIN Take 1 tablet (4 mg total) by mouth daily at 6 PM. Start taking on:  05/04/2016       Discharge Assessment: Vitals:   05/03/16 0705 05/03/16 1348  BP: 103/62 113/74  Pulse: 89 84  Resp: 17 16  Temp: 98.3 F (36.8 C) 98 F (36.7 C)   Skin clean, dry and intact without evidence of skin break down, no evidence of skin tears noted. IV catheter discontinued intact. Site without signs and symptoms of complications - no redness or edema noted at insertion site, patient denies c/o pain - only slight tenderness at site.  Dressing with slight pressure applied.  D/c Instructions-Education: Discharge instructions given to patient/family with verbalized understanding. D/c education completed with patient/family including follow  up instructions, medication list, d/c activities limitations if indicated, with other d/c instructions as indicated by MD - patient able to verbalize understanding, all questions fully answered. Patient instructed to return to ED, call 911, or call MD for any changes in condition.  Patient escorted via [facility], and D/C to [facility] in [place] via private auto with husband  Salley Slaughter, RN 05/03/2016 3:52 PM

## 2016-05-07 DIAGNOSIS — I120 Hypertensive chronic kidney disease with stage 5 chronic kidney disease or end stage renal disease: Secondary | ICD-10-CM | POA: Diagnosis not present

## 2016-05-07 DIAGNOSIS — Z7901 Long term (current) use of anticoagulants: Secondary | ICD-10-CM | POA: Diagnosis not present

## 2016-05-09 DIAGNOSIS — I5022 Chronic systolic (congestive) heart failure: Secondary | ICD-10-CM | POA: Diagnosis not present

## 2016-05-09 DIAGNOSIS — Z7984 Long term (current) use of oral hypoglycemic drugs: Secondary | ICD-10-CM | POA: Diagnosis not present

## 2016-05-09 DIAGNOSIS — N184 Chronic kidney disease, stage 4 (severe): Secondary | ICD-10-CM | POA: Diagnosis not present

## 2016-05-09 DIAGNOSIS — Z9181 History of falling: Secondary | ICD-10-CM | POA: Diagnosis not present

## 2016-05-09 DIAGNOSIS — Z794 Long term (current) use of insulin: Secondary | ICD-10-CM | POA: Diagnosis not present

## 2016-05-09 DIAGNOSIS — I130 Hypertensive heart and chronic kidney disease with heart failure and stage 1 through stage 4 chronic kidney disease, or unspecified chronic kidney disease: Secondary | ICD-10-CM | POA: Diagnosis not present

## 2016-05-09 DIAGNOSIS — R262 Difficulty in walking, not elsewhere classified: Secondary | ICD-10-CM | POA: Diagnosis not present

## 2016-05-09 DIAGNOSIS — M6281 Muscle weakness (generalized): Secondary | ICD-10-CM | POA: Diagnosis not present

## 2016-05-09 DIAGNOSIS — E1122 Type 2 diabetes mellitus with diabetic chronic kidney disease: Secondary | ICD-10-CM | POA: Diagnosis not present

## 2016-05-10 DIAGNOSIS — M6281 Muscle weakness (generalized): Secondary | ICD-10-CM | POA: Diagnosis not present

## 2016-05-10 DIAGNOSIS — Z7984 Long term (current) use of oral hypoglycemic drugs: Secondary | ICD-10-CM | POA: Diagnosis not present

## 2016-05-10 DIAGNOSIS — R262 Difficulty in walking, not elsewhere classified: Secondary | ICD-10-CM | POA: Diagnosis not present

## 2016-05-10 DIAGNOSIS — N184 Chronic kidney disease, stage 4 (severe): Secondary | ICD-10-CM | POA: Diagnosis not present

## 2016-05-10 DIAGNOSIS — I130 Hypertensive heart and chronic kidney disease with heart failure and stage 1 through stage 4 chronic kidney disease, or unspecified chronic kidney disease: Secondary | ICD-10-CM | POA: Diagnosis not present

## 2016-05-10 DIAGNOSIS — Z9181 History of falling: Secondary | ICD-10-CM | POA: Diagnosis not present

## 2016-05-10 DIAGNOSIS — I5022 Chronic systolic (congestive) heart failure: Secondary | ICD-10-CM | POA: Diagnosis not present

## 2016-05-10 DIAGNOSIS — E1122 Type 2 diabetes mellitus with diabetic chronic kidney disease: Secondary | ICD-10-CM | POA: Diagnosis not present

## 2016-05-10 DIAGNOSIS — Z794 Long term (current) use of insulin: Secondary | ICD-10-CM | POA: Diagnosis not present

## 2016-05-13 ENCOUNTER — Telehealth: Payer: Self-pay | Admitting: Cardiovascular Disease

## 2016-05-13 NOTE — Telephone Encounter (Signed)
Received records from [facility] for appointment on 06/10/16 with Dr Oval Linsey.  Records given to [facility] (medical records) for Dr Blenda Mounts schedule on 9/25/117. lp

## 2016-05-14 DIAGNOSIS — Z7984 Long term (current) use of oral hypoglycemic drugs: Secondary | ICD-10-CM | POA: Diagnosis not present

## 2016-05-14 DIAGNOSIS — I5022 Chronic systolic (congestive) heart failure: Secondary | ICD-10-CM | POA: Diagnosis not present

## 2016-05-14 DIAGNOSIS — Z7901 Long term (current) use of anticoagulants: Secondary | ICD-10-CM | POA: Diagnosis not present

## 2016-05-14 DIAGNOSIS — R262 Difficulty in walking, not elsewhere classified: Secondary | ICD-10-CM | POA: Diagnosis not present

## 2016-05-14 DIAGNOSIS — Z794 Long term (current) use of insulin: Secondary | ICD-10-CM | POA: Diagnosis not present

## 2016-05-14 DIAGNOSIS — Z9181 History of falling: Secondary | ICD-10-CM | POA: Diagnosis not present

## 2016-05-14 DIAGNOSIS — M6281 Muscle weakness (generalized): Secondary | ICD-10-CM | POA: Diagnosis not present

## 2016-05-14 DIAGNOSIS — I130 Hypertensive heart and chronic kidney disease with heart failure and stage 1 through stage 4 chronic kidney disease, or unspecified chronic kidney disease: Secondary | ICD-10-CM | POA: Diagnosis not present

## 2016-05-14 DIAGNOSIS — N184 Chronic kidney disease, stage 4 (severe): Secondary | ICD-10-CM | POA: Diagnosis not present

## 2016-05-14 DIAGNOSIS — E1122 Type 2 diabetes mellitus with diabetic chronic kidney disease: Secondary | ICD-10-CM | POA: Diagnosis not present

## 2016-05-15 DIAGNOSIS — Z9181 History of falling: Secondary | ICD-10-CM | POA: Diagnosis not present

## 2016-05-15 DIAGNOSIS — I5022 Chronic systolic (congestive) heart failure: Secondary | ICD-10-CM | POA: Diagnosis not present

## 2016-05-15 DIAGNOSIS — Z794 Long term (current) use of insulin: Secondary | ICD-10-CM | POA: Diagnosis not present

## 2016-05-15 DIAGNOSIS — M6281 Muscle weakness (generalized): Secondary | ICD-10-CM | POA: Diagnosis not present

## 2016-05-15 DIAGNOSIS — N184 Chronic kidney disease, stage 4 (severe): Secondary | ICD-10-CM | POA: Diagnosis not present

## 2016-05-15 DIAGNOSIS — I130 Hypertensive heart and chronic kidney disease with heart failure and stage 1 through stage 4 chronic kidney disease, or unspecified chronic kidney disease: Secondary | ICD-10-CM | POA: Diagnosis not present

## 2016-05-15 DIAGNOSIS — Z7984 Long term (current) use of oral hypoglycemic drugs: Secondary | ICD-10-CM | POA: Diagnosis not present

## 2016-05-15 DIAGNOSIS — E1122 Type 2 diabetes mellitus with diabetic chronic kidney disease: Secondary | ICD-10-CM | POA: Diagnosis not present

## 2016-05-15 DIAGNOSIS — R262 Difficulty in walking, not elsewhere classified: Secondary | ICD-10-CM | POA: Diagnosis not present

## 2016-05-16 DIAGNOSIS — I509 Heart failure, unspecified: Secondary | ICD-10-CM | POA: Diagnosis not present

## 2016-05-16 DIAGNOSIS — K7581 Nonalcoholic steatohepatitis (NASH): Secondary | ICD-10-CM | POA: Diagnosis not present

## 2016-05-16 DIAGNOSIS — I4891 Unspecified atrial fibrillation: Secondary | ICD-10-CM | POA: Diagnosis not present

## 2016-05-16 DIAGNOSIS — N184 Chronic kidney disease, stage 4 (severe): Secondary | ICD-10-CM | POA: Diagnosis not present

## 2016-05-16 DIAGNOSIS — E1129 Type 2 diabetes mellitus with other diabetic kidney complication: Secondary | ICD-10-CM | POA: Diagnosis not present

## 2016-05-17 DIAGNOSIS — M6281 Muscle weakness (generalized): Secondary | ICD-10-CM | POA: Diagnosis not present

## 2016-05-17 DIAGNOSIS — I130 Hypertensive heart and chronic kidney disease with heart failure and stage 1 through stage 4 chronic kidney disease, or unspecified chronic kidney disease: Secondary | ICD-10-CM | POA: Diagnosis not present

## 2016-05-17 DIAGNOSIS — Z794 Long term (current) use of insulin: Secondary | ICD-10-CM | POA: Diagnosis not present

## 2016-05-17 DIAGNOSIS — R262 Difficulty in walking, not elsewhere classified: Secondary | ICD-10-CM | POA: Diagnosis not present

## 2016-05-17 DIAGNOSIS — Z9181 History of falling: Secondary | ICD-10-CM | POA: Diagnosis not present

## 2016-05-17 DIAGNOSIS — Z7984 Long term (current) use of oral hypoglycemic drugs: Secondary | ICD-10-CM | POA: Diagnosis not present

## 2016-05-17 DIAGNOSIS — N184 Chronic kidney disease, stage 4 (severe): Secondary | ICD-10-CM | POA: Diagnosis not present

## 2016-05-17 DIAGNOSIS — E1122 Type 2 diabetes mellitus with diabetic chronic kidney disease: Secondary | ICD-10-CM | POA: Diagnosis not present

## 2016-05-17 DIAGNOSIS — I5022 Chronic systolic (congestive) heart failure: Secondary | ICD-10-CM | POA: Diagnosis not present

## 2016-05-18 DIAGNOSIS — Z9181 History of falling: Secondary | ICD-10-CM | POA: Diagnosis not present

## 2016-05-18 DIAGNOSIS — Z794 Long term (current) use of insulin: Secondary | ICD-10-CM | POA: Diagnosis not present

## 2016-05-18 DIAGNOSIS — R262 Difficulty in walking, not elsewhere classified: Secondary | ICD-10-CM | POA: Diagnosis not present

## 2016-05-18 DIAGNOSIS — I130 Hypertensive heart and chronic kidney disease with heart failure and stage 1 through stage 4 chronic kidney disease, or unspecified chronic kidney disease: Secondary | ICD-10-CM | POA: Diagnosis not present

## 2016-05-18 DIAGNOSIS — Z7984 Long term (current) use of oral hypoglycemic drugs: Secondary | ICD-10-CM | POA: Diagnosis not present

## 2016-05-18 DIAGNOSIS — E1122 Type 2 diabetes mellitus with diabetic chronic kidney disease: Secondary | ICD-10-CM | POA: Diagnosis not present

## 2016-05-18 DIAGNOSIS — I5022 Chronic systolic (congestive) heart failure: Secondary | ICD-10-CM | POA: Diagnosis not present

## 2016-05-18 DIAGNOSIS — M6281 Muscle weakness (generalized): Secondary | ICD-10-CM | POA: Diagnosis not present

## 2016-05-18 DIAGNOSIS — Z7901 Long term (current) use of anticoagulants: Secondary | ICD-10-CM | POA: Diagnosis not present

## 2016-05-18 DIAGNOSIS — N184 Chronic kidney disease, stage 4 (severe): Secondary | ICD-10-CM | POA: Diagnosis not present

## 2016-05-19 DIAGNOSIS — Z9181 History of falling: Secondary | ICD-10-CM | POA: Diagnosis not present

## 2016-05-19 DIAGNOSIS — I482 Chronic atrial fibrillation: Secondary | ICD-10-CM | POA: Diagnosis not present

## 2016-05-19 DIAGNOSIS — I5022 Chronic systolic (congestive) heart failure: Secondary | ICD-10-CM | POA: Diagnosis not present

## 2016-05-19 DIAGNOSIS — Z7901 Long term (current) use of anticoagulants: Secondary | ICD-10-CM | POA: Diagnosis not present

## 2016-05-19 DIAGNOSIS — N184 Chronic kidney disease, stage 4 (severe): Secondary | ICD-10-CM | POA: Diagnosis not present

## 2016-05-19 DIAGNOSIS — Z794 Long term (current) use of insulin: Secondary | ICD-10-CM | POA: Diagnosis not present

## 2016-05-19 DIAGNOSIS — Z7984 Long term (current) use of oral hypoglycemic drugs: Secondary | ICD-10-CM | POA: Diagnosis not present

## 2016-05-19 DIAGNOSIS — M6281 Muscle weakness (generalized): Secondary | ICD-10-CM | POA: Diagnosis not present

## 2016-05-19 DIAGNOSIS — E1122 Type 2 diabetes mellitus with diabetic chronic kidney disease: Secondary | ICD-10-CM | POA: Diagnosis not present

## 2016-05-19 DIAGNOSIS — I130 Hypertensive heart and chronic kidney disease with heart failure and stage 1 through stage 4 chronic kidney disease, or unspecified chronic kidney disease: Secondary | ICD-10-CM | POA: Diagnosis not present

## 2016-05-19 DIAGNOSIS — R262 Difficulty in walking, not elsewhere classified: Secondary | ICD-10-CM | POA: Diagnosis not present

## 2016-05-21 DIAGNOSIS — I5022 Chronic systolic (congestive) heart failure: Secondary | ICD-10-CM | POA: Diagnosis not present

## 2016-05-21 DIAGNOSIS — Z7984 Long term (current) use of oral hypoglycemic drugs: Secondary | ICD-10-CM | POA: Diagnosis not present

## 2016-05-21 DIAGNOSIS — R262 Difficulty in walking, not elsewhere classified: Secondary | ICD-10-CM | POA: Diagnosis not present

## 2016-05-21 DIAGNOSIS — E1122 Type 2 diabetes mellitus with diabetic chronic kidney disease: Secondary | ICD-10-CM | POA: Diagnosis not present

## 2016-05-21 DIAGNOSIS — Z9181 History of falling: Secondary | ICD-10-CM | POA: Diagnosis not present

## 2016-05-21 DIAGNOSIS — M6281 Muscle weakness (generalized): Secondary | ICD-10-CM | POA: Diagnosis not present

## 2016-05-21 DIAGNOSIS — I130 Hypertensive heart and chronic kidney disease with heart failure and stage 1 through stage 4 chronic kidney disease, or unspecified chronic kidney disease: Secondary | ICD-10-CM | POA: Diagnosis not present

## 2016-05-21 DIAGNOSIS — N184 Chronic kidney disease, stage 4 (severe): Secondary | ICD-10-CM | POA: Diagnosis not present

## 2016-05-21 DIAGNOSIS — Z794 Long term (current) use of insulin: Secondary | ICD-10-CM | POA: Diagnosis not present

## 2016-05-22 DIAGNOSIS — R262 Difficulty in walking, not elsewhere classified: Secondary | ICD-10-CM | POA: Diagnosis not present

## 2016-05-22 DIAGNOSIS — E1122 Type 2 diabetes mellitus with diabetic chronic kidney disease: Secondary | ICD-10-CM | POA: Diagnosis not present

## 2016-05-22 DIAGNOSIS — N184 Chronic kidney disease, stage 4 (severe): Secondary | ICD-10-CM | POA: Diagnosis not present

## 2016-05-22 DIAGNOSIS — M6281 Muscle weakness (generalized): Secondary | ICD-10-CM | POA: Diagnosis not present

## 2016-05-22 DIAGNOSIS — Z794 Long term (current) use of insulin: Secondary | ICD-10-CM | POA: Diagnosis not present

## 2016-05-22 DIAGNOSIS — Z7984 Long term (current) use of oral hypoglycemic drugs: Secondary | ICD-10-CM | POA: Diagnosis not present

## 2016-05-22 DIAGNOSIS — I130 Hypertensive heart and chronic kidney disease with heart failure and stage 1 through stage 4 chronic kidney disease, or unspecified chronic kidney disease: Secondary | ICD-10-CM | POA: Diagnosis not present

## 2016-05-22 DIAGNOSIS — Z9181 History of falling: Secondary | ICD-10-CM | POA: Diagnosis not present

## 2016-05-22 DIAGNOSIS — I5022 Chronic systolic (congestive) heart failure: Secondary | ICD-10-CM | POA: Diagnosis not present

## 2016-05-23 DIAGNOSIS — I129 Hypertensive chronic kidney disease with stage 1 through stage 4 chronic kidney disease, or unspecified chronic kidney disease: Secondary | ICD-10-CM | POA: Diagnosis not present

## 2016-05-23 DIAGNOSIS — D62 Acute posthemorrhagic anemia: Secondary | ICD-10-CM | POA: Diagnosis not present

## 2016-05-23 DIAGNOSIS — R262 Difficulty in walking, not elsewhere classified: Secondary | ICD-10-CM | POA: Diagnosis not present

## 2016-05-23 DIAGNOSIS — K7290 Hepatic failure, unspecified without coma: Secondary | ICD-10-CM | POA: Diagnosis not present

## 2016-05-23 DIAGNOSIS — R55 Syncope and collapse: Secondary | ICD-10-CM | POA: Diagnosis not present

## 2016-05-23 DIAGNOSIS — I130 Hypertensive heart and chronic kidney disease with heart failure and stage 1 through stage 4 chronic kidney disease, or unspecified chronic kidney disease: Secondary | ICD-10-CM | POA: Diagnosis not present

## 2016-05-23 DIAGNOSIS — Z7984 Long term (current) use of oral hypoglycemic drugs: Secondary | ICD-10-CM | POA: Diagnosis not present

## 2016-05-23 DIAGNOSIS — N184 Chronic kidney disease, stage 4 (severe): Secondary | ICD-10-CM | POA: Diagnosis not present

## 2016-05-23 DIAGNOSIS — Z794 Long term (current) use of insulin: Secondary | ICD-10-CM | POA: Diagnosis not present

## 2016-05-23 DIAGNOSIS — E1122 Type 2 diabetes mellitus with diabetic chronic kidney disease: Secondary | ICD-10-CM | POA: Diagnosis not present

## 2016-05-23 DIAGNOSIS — R404 Transient alteration of awareness: Secondary | ICD-10-CM | POA: Diagnosis not present

## 2016-05-23 DIAGNOSIS — E860 Dehydration: Secondary | ICD-10-CM | POA: Diagnosis not present

## 2016-05-23 DIAGNOSIS — R531 Weakness: Secondary | ICD-10-CM | POA: Diagnosis not present

## 2016-05-23 DIAGNOSIS — Z9181 History of falling: Secondary | ICD-10-CM | POA: Diagnosis not present

## 2016-05-23 DIAGNOSIS — K922 Gastrointestinal hemorrhage, unspecified: Secondary | ICD-10-CM | POA: Diagnosis not present

## 2016-05-23 DIAGNOSIS — N179 Acute kidney failure, unspecified: Secondary | ICD-10-CM | POA: Diagnosis not present

## 2016-05-23 DIAGNOSIS — Z7901 Long term (current) use of anticoagulants: Secondary | ICD-10-CM | POA: Diagnosis not present

## 2016-05-23 DIAGNOSIS — T45515A Adverse effect of anticoagulants, initial encounter: Secondary | ICD-10-CM | POA: Diagnosis not present

## 2016-05-23 DIAGNOSIS — M6281 Muscle weakness (generalized): Secondary | ICD-10-CM | POA: Diagnosis not present

## 2016-05-23 DIAGNOSIS — R112 Nausea with vomiting, unspecified: Secondary | ICD-10-CM | POA: Diagnosis not present

## 2016-05-23 DIAGNOSIS — I5022 Chronic systolic (congestive) heart failure: Secondary | ICD-10-CM | POA: Diagnosis not present

## 2016-05-24 ENCOUNTER — Inpatient Hospital Stay (HOSPITAL_COMMUNITY): Payer: Medicare Other

## 2016-05-24 ENCOUNTER — Inpatient Hospital Stay (HOSPITAL_COMMUNITY)
Admission: AD | Admit: 2016-05-24 | Discharge: 2016-06-04 | DRG: 377 | Disposition: A | Discharge status: Home Health | Payer: Medicare Other | Source: Other Acute Inpatient Hospital | Attending: Internal Medicine | Admitting: Internal Medicine

## 2016-05-24 ENCOUNTER — Encounter (HOSPITAL_COMMUNITY): Payer: Self-pay

## 2016-05-24 DIAGNOSIS — N179 Acute kidney failure, unspecified: Secondary | ICD-10-CM | POA: Diagnosis not present

## 2016-05-24 DIAGNOSIS — D62 Acute posthemorrhagic anemia: Secondary | ICD-10-CM | POA: Diagnosis present

## 2016-05-24 DIAGNOSIS — E1122 Type 2 diabetes mellitus with diabetic chronic kidney disease: Secondary | ICD-10-CM | POA: Diagnosis not present

## 2016-05-24 DIAGNOSIS — Z951 Presence of aortocoronary bypass graft: Secondary | ICD-10-CM | POA: Diagnosis not present

## 2016-05-24 DIAGNOSIS — Z4659 Encounter for fitting and adjustment of other gastrointestinal appliance and device: Secondary | ICD-10-CM

## 2016-05-24 DIAGNOSIS — D689 Coagulation defect, unspecified: Secondary | ICD-10-CM

## 2016-05-24 DIAGNOSIS — N184 Chronic kidney disease, stage 4 (severe): Secondary | ICD-10-CM | POA: Diagnosis not present

## 2016-05-24 DIAGNOSIS — M6281 Muscle weakness (generalized): Secondary | ICD-10-CM

## 2016-05-24 DIAGNOSIS — K5521 Angiodysplasia of colon with hemorrhage: Secondary | ICD-10-CM | POA: Diagnosis not present

## 2016-05-24 DIAGNOSIS — K921 Melena: Secondary | ICD-10-CM | POA: Diagnosis not present

## 2016-05-24 DIAGNOSIS — D631 Anemia in chronic kidney disease: Secondary | ICD-10-CM | POA: Diagnosis not present

## 2016-05-24 DIAGNOSIS — E46 Unspecified protein-calorie malnutrition: Secondary | ICD-10-CM | POA: Diagnosis present

## 2016-05-24 DIAGNOSIS — K449 Diaphragmatic hernia without obstruction or gangrene: Secondary | ICD-10-CM | POA: Diagnosis not present

## 2016-05-24 DIAGNOSIS — E1165 Type 2 diabetes mellitus with hyperglycemia: Secondary | ICD-10-CM

## 2016-05-24 DIAGNOSIS — D123 Benign neoplasm of transverse colon: Secondary | ICD-10-CM | POA: Diagnosis not present

## 2016-05-24 DIAGNOSIS — R2689 Other abnormalities of gait and mobility: Secondary | ICD-10-CM

## 2016-05-24 DIAGNOSIS — I272 Other secondary pulmonary hypertension: Secondary | ICD-10-CM | POA: Diagnosis not present

## 2016-05-24 DIAGNOSIS — Z9049 Acquired absence of other specified parts of digestive tract: Secondary | ICD-10-CM

## 2016-05-24 DIAGNOSIS — J449 Chronic obstructive pulmonary disease, unspecified: Secondary | ICD-10-CM | POA: Diagnosis not present

## 2016-05-24 DIAGNOSIS — I130 Hypertensive heart and chronic kidney disease with heart failure and stage 1 through stage 4 chronic kidney disease, or unspecified chronic kidney disease: Secondary | ICD-10-CM | POA: Diagnosis not present

## 2016-05-24 DIAGNOSIS — K7469 Other cirrhosis of liver: Secondary | ICD-10-CM

## 2016-05-24 DIAGNOSIS — E871 Hypo-osmolality and hyponatremia: Secondary | ICD-10-CM | POA: Diagnosis not present

## 2016-05-24 DIAGNOSIS — E785 Hyperlipidemia, unspecified: Secondary | ICD-10-CM | POA: Diagnosis present

## 2016-05-24 DIAGNOSIS — N2581 Secondary hyperparathyroidism of renal origin: Secondary | ICD-10-CM | POA: Diagnosis not present

## 2016-05-24 DIAGNOSIS — K625 Hemorrhage of anus and rectum: Secondary | ICD-10-CM | POA: Diagnosis not present

## 2016-05-24 DIAGNOSIS — I2510 Atherosclerotic heart disease of native coronary artery without angina pectoris: Secondary | ICD-10-CM | POA: Diagnosis present

## 2016-05-24 DIAGNOSIS — Z515 Encounter for palliative care: Secondary | ICD-10-CM

## 2016-05-24 DIAGNOSIS — E876 Hypokalemia: Secondary | ICD-10-CM | POA: Diagnosis present

## 2016-05-24 DIAGNOSIS — R918 Other nonspecific abnormal finding of lung field: Secondary | ICD-10-CM | POA: Diagnosis not present

## 2016-05-24 DIAGNOSIS — Z7189 Other specified counseling: Secondary | ICD-10-CM | POA: Diagnosis not present

## 2016-05-24 DIAGNOSIS — Z87891 Personal history of nicotine dependence: Secondary | ICD-10-CM

## 2016-05-24 DIAGNOSIS — E1029 Type 1 diabetes mellitus with other diabetic kidney complication: Secondary | ICD-10-CM | POA: Diagnosis not present

## 2016-05-24 DIAGNOSIS — I482 Chronic atrial fibrillation, unspecified: Secondary | ICD-10-CM | POA: Diagnosis present

## 2016-05-24 DIAGNOSIS — I5023 Acute on chronic systolic (congestive) heart failure: Secondary | ICD-10-CM | POA: Diagnosis not present

## 2016-05-24 DIAGNOSIS — E11649 Type 2 diabetes mellitus with hypoglycemia without coma: Secondary | ICD-10-CM | POA: Diagnosis not present

## 2016-05-24 DIAGNOSIS — Z794 Long term (current) use of insulin: Secondary | ICD-10-CM | POA: Diagnosis not present

## 2016-05-24 DIAGNOSIS — D122 Benign neoplasm of ascending colon: Secondary | ICD-10-CM | POA: Diagnosis not present

## 2016-05-24 DIAGNOSIS — K7682 Hepatic encephalopathy: Secondary | ICD-10-CM | POA: Diagnosis present

## 2016-05-24 DIAGNOSIS — R791 Abnormal coagulation profile: Secondary | ICD-10-CM | POA: Diagnosis present

## 2016-05-24 DIAGNOSIS — Z452 Encounter for adjustment and management of vascular access device: Secondary | ICD-10-CM | POA: Diagnosis not present

## 2016-05-24 DIAGNOSIS — Z4682 Encounter for fitting and adjustment of non-vascular catheter: Secondary | ICD-10-CM | POA: Diagnosis not present

## 2016-05-24 DIAGNOSIS — G9340 Encephalopathy, unspecified: Secondary | ICD-10-CM | POA: Diagnosis not present

## 2016-05-24 DIAGNOSIS — E860 Dehydration: Secondary | ICD-10-CM | POA: Diagnosis not present

## 2016-05-24 DIAGNOSIS — L299 Pruritus, unspecified: Secondary | ICD-10-CM | POA: Diagnosis not present

## 2016-05-24 DIAGNOSIS — K922 Gastrointestinal hemorrhage, unspecified: Secondary | ICD-10-CM | POA: Diagnosis present

## 2016-05-24 DIAGNOSIS — J90 Pleural effusion, not elsewhere classified: Secondary | ICD-10-CM | POA: Diagnosis not present

## 2016-05-24 DIAGNOSIS — Z7901 Long term (current) use of anticoagulants: Secondary | ICD-10-CM | POA: Diagnosis not present

## 2016-05-24 DIAGNOSIS — D509 Iron deficiency anemia, unspecified: Secondary | ICD-10-CM | POA: Diagnosis not present

## 2016-05-24 DIAGNOSIS — Z9861 Coronary angioplasty status: Secondary | ICD-10-CM

## 2016-05-24 DIAGNOSIS — I120 Hypertensive chronic kidney disease with stage 5 chronic kidney disease or end stage renal disease: Secondary | ICD-10-CM | POA: Diagnosis not present

## 2016-05-24 DIAGNOSIS — K254 Chronic or unspecified gastric ulcer with hemorrhage: Secondary | ICD-10-CM | POA: Diagnosis not present

## 2016-05-24 DIAGNOSIS — M109 Gout, unspecified: Secondary | ICD-10-CM | POA: Diagnosis present

## 2016-05-24 DIAGNOSIS — IMO0002 Reserved for concepts with insufficient information to code with codable children: Secondary | ICD-10-CM

## 2016-05-24 DIAGNOSIS — R778 Other specified abnormalities of plasma proteins: Secondary | ICD-10-CM | POA: Diagnosis present

## 2016-05-24 DIAGNOSIS — R4182 Altered mental status, unspecified: Secondary | ICD-10-CM

## 2016-05-24 DIAGNOSIS — K7290 Hepatic failure, unspecified without coma: Secondary | ICD-10-CM | POA: Diagnosis not present

## 2016-05-24 DIAGNOSIS — T45515A Adverse effect of anticoagulants, initial encounter: Secondary | ICD-10-CM | POA: Diagnosis not present

## 2016-05-24 DIAGNOSIS — Z79899 Other long term (current) drug therapy: Secondary | ICD-10-CM | POA: Diagnosis not present

## 2016-05-24 DIAGNOSIS — Z7982 Long term (current) use of aspirin: Secondary | ICD-10-CM

## 2016-05-24 DIAGNOSIS — D124 Benign neoplasm of descending colon: Secondary | ICD-10-CM | POA: Diagnosis not present

## 2016-05-24 DIAGNOSIS — E8770 Fluid overload, unspecified: Secondary | ICD-10-CM | POA: Diagnosis not present

## 2016-05-24 DIAGNOSIS — E118 Type 2 diabetes mellitus with unspecified complications: Secondary | ICD-10-CM

## 2016-05-24 DIAGNOSIS — I509 Heart failure, unspecified: Secondary | ICD-10-CM | POA: Diagnosis not present

## 2016-05-24 DIAGNOSIS — Z6830 Body mass index (BMI) 30.0-30.9, adult: Secondary | ICD-10-CM

## 2016-05-24 DIAGNOSIS — D649 Anemia, unspecified: Secondary | ICD-10-CM | POA: Diagnosis not present

## 2016-05-24 DIAGNOSIS — R079 Chest pain, unspecified: Secondary | ICD-10-CM | POA: Diagnosis not present

## 2016-05-24 DIAGNOSIS — K5730 Diverticulosis of large intestine without perforation or abscess without bleeding: Secondary | ICD-10-CM | POA: Diagnosis present

## 2016-05-24 DIAGNOSIS — I129 Hypertensive chronic kidney disease with stage 1 through stage 4 chronic kidney disease, or unspecified chronic kidney disease: Secondary | ICD-10-CM | POA: Diagnosis not present

## 2016-05-24 LAB — CBC
HEMATOCRIT: 30 % — AB (ref 36.0–46.0)
Hemoglobin: 9.5 g/dL — ABNORMAL LOW (ref 12.0–15.0)
MCH: 26.3 pg (ref 26.0–34.0)
MCHC: 31.7 g/dL (ref 30.0–36.0)
MCV: 83.1 fL (ref 78.0–100.0)
PLATELETS: 188 10*3/uL (ref 150–400)
RBC: 3.61 MIL/uL — AB (ref 3.87–5.11)
RDW: 16.9 % — AB (ref 11.5–15.5)
WBC: 9.5 10*3/uL (ref 4.0–10.5)

## 2016-05-24 LAB — LIPID PANEL
CHOL/HDL RATIO: 3.2 ratio
CHOLESTEROL: 123 mg/dL (ref 0–200)
HDL: 38 mg/dL — ABNORMAL LOW (ref >40)
LDL Cholesterol: 59 mg/dL (ref 0–99)
TRIGLYCERIDES: 129 mg/dL (ref <150)
VLDL: 26 mg/dL (ref 0–40)

## 2016-05-24 LAB — PREPARE RBC (CROSSMATCH)

## 2016-05-24 LAB — AMMONIA: AMMONIA: 41 umol/L — AB (ref 9–35)

## 2016-05-24 LAB — TROPONIN I
TROPONIN I: 0.11 ng/mL — AB (ref <0.03)
Troponin I: 0.08 ng/mL (ref <0.03)
Troponin I: 0.12 ng/mL (ref <0.03)

## 2016-05-24 LAB — GLUCOSE, CAPILLARY
GLUCOSE-CAPILLARY: 192 mg/dL — AB (ref 65–99)
GLUCOSE-CAPILLARY: 166 mg/dL — AB (ref 65–99)
GLUCOSE-CAPILLARY: 112 mg/dL — AB (ref 65–99)
GLUCOSE-CAPILLARY: 88 mg/dL (ref 65–99)

## 2016-05-24 LAB — COMPREHENSIVE METABOLIC PANEL
ALBUMIN: 2.8 g/dL — AB (ref 3.5–5.0)
ALT: 21 U/L (ref 14–54)
ANION GAP: 15 (ref 5–15)
AST: 35 U/L (ref 15–41)
Alkaline Phosphatase: 200 U/L — ABNORMAL HIGH (ref 38–126)
BILIRUBIN TOTAL: 1.8 mg/dL — AB (ref 0.3–1.2)
BUN: 129 mg/dL — AB (ref 6–20)
CHLORIDE: 96 mmol/L — AB (ref 101–111)
CO2: 25 mmol/L (ref 22–32)
Calcium: 7.8 mg/dL — ABNORMAL LOW (ref 8.9–10.3)
Creatinine, Ser: 3.5 mg/dL — ABNORMAL HIGH (ref 0.44–1.00)
GFR calc Af Amer: 14 mL/min — ABNORMAL LOW (ref >60)
GFR calc non Af Amer: 12 mL/min — ABNORMAL LOW (ref >60)
GLUCOSE: 103 mg/dL — AB (ref 65–99)
POTASSIUM: 3.4 mmol/L — AB (ref 3.5–5.1)
Sodium: 136 mmol/L (ref 135–145)
TOTAL PROTEIN: 7.2 g/dL (ref 6.5–8.1)

## 2016-05-24 LAB — LACTIC ACID, PLASMA
Lactic Acid, Venous: 5 mmol/L (ref 0.5–1.9)
Lactic Acid, Venous: 3.7 mmol/L (ref 0.5–1.9)

## 2016-05-24 LAB — PROTIME-INR
INR: 2.78
Prothrombin Time: 29.9 seconds — ABNORMAL HIGH (ref 11.4–15.2)

## 2016-05-24 LAB — APTT: APTT: 45 s — AB (ref 24–36)

## 2016-05-24 LAB — MRSA PCR SCREENING: MRSA by PCR: NEGATIVE

## 2016-05-24 LAB — MAGNESIUM: MAGNESIUM: 2 mg/dL (ref 1.7–2.4)

## 2016-05-24 LAB — PHOSPHORUS: Phosphorus: 6.5 mg/dL — ABNORMAL HIGH (ref 2.5–4.6)

## 2016-05-24 MED ORDER — INSULIN ASPART 100 UNIT/ML ~~LOC~~ SOLN: 0.0000 [IU] | Freq: Three times a day (TID) | SUBCUTANEOUS | Status: DC

## 2016-05-24 MED ORDER — SODIUM CHLORIDE 0.9 % IV SOLN: Freq: Once | INTRAVENOUS | Status: DC

## 2016-05-24 MED ORDER — SODIUM CHLORIDE 0.9 % IV SOLN
8.0000 mg/h | INTRAVENOUS | Status: DC
  Administered 2016-05-24 – 2016-05-25 (×5): 8 mg/h via INTRAVENOUS
  Filled 2016-05-24 (×15): qty 80

## 2016-05-24 MED ORDER — SODIUM CHLORIDE 0.9 % IV SOLN
Freq: Once | INTRAVENOUS | Status: AC
  Administered 2016-05-24: 09:00:00 via INTRAVENOUS

## 2016-05-24 MED ORDER — RIFAXIMIN 200 MG PO TABS
400.0000 mg | ORAL_TABLET | Freq: Three times a day (TID) | ORAL | Status: DC
  Administered 2016-05-24 – 2016-06-04 (×32): 400 mg via ORAL
  Filled 2016-05-24 (×36): qty 2

## 2016-05-24 MED ORDER — ONDANSETRON HCL 4 MG/2ML IJ SOLN: 4.0000 mg | Freq: Four times a day (QID) | INTRAMUSCULAR | Status: DC | PRN

## 2016-05-24 MED ORDER — OCTREOTIDE LOAD VIA INFUSION
50.0000 ug | Freq: Once | INTRAVENOUS | Status: AC
  Administered 2016-05-24: 50 ug via INTRAVENOUS
  Filled 2016-05-24: qty 25

## 2016-05-24 MED ORDER — ACETAMINOPHEN 325 MG PO TABS
650.0000 mg | ORAL_TABLET | Freq: Four times a day (QID) | ORAL | Status: DC | PRN
  Administered 2016-05-24 – 2016-05-29 (×4): 650 mg via ORAL
  Filled 2016-05-24 (×7): qty 2

## 2016-05-24 MED ORDER — LACTULOSE ENEMA
300.0000 mL | Freq: Two times a day (BID) | ORAL | Status: DC
  Administered 2016-05-24 – 2016-05-26 (×4): 300 mL via RECTAL
  Filled 2016-05-24 (×7): qty 300

## 2016-05-24 MED ORDER — INSULIN ASPART 100 UNIT/ML ~~LOC~~ SOLN
0.0000 [IU] | Freq: Three times a day (TID) | SUBCUTANEOUS | Status: DC
  Administered 2016-05-24: 2 [IU] via SUBCUTANEOUS
  Administered 2016-05-24: 3 [IU] via SUBCUTANEOUS
  Administered 2016-05-25: 1 [IU] via SUBCUTANEOUS
  Administered 2016-05-26: 2 [IU] via SUBCUTANEOUS
  Administered 2016-05-27: 3 [IU] via SUBCUTANEOUS
  Administered 2016-05-27: 7 [IU] via SUBCUTANEOUS

## 2016-05-24 MED ORDER — METOPROLOL TARTRATE 5 MG/5ML IV SOLN
2.5000 mg | Freq: Three times a day (TID) | INTRAVENOUS | Status: DC
  Administered 2016-05-24 – 2016-05-25 (×4): 2.5 mg via INTRAVENOUS
  Filled 2016-05-24 (×4): qty 5

## 2016-05-24 MED ORDER — INSULIN GLARGINE 100 UNIT/ML ~~LOC~~ SOLN
3.0000 [IU] | Freq: Every day | SUBCUTANEOUS | Status: DC
  Administered 2016-05-24: 3 [IU] via SUBCUTANEOUS
  Filled 2016-05-24 (×4): qty 0.03

## 2016-05-24 MED ORDER — ONDANSETRON HCL 4 MG PO TABS: 4.0000 mg | ORAL_TABLET | Freq: Four times a day (QID) | ORAL | Status: DC | PRN

## 2016-05-24 MED ORDER — ACETAMINOPHEN 650 MG RE SUPP: 650.0000 mg | Freq: Four times a day (QID) | RECTAL | Status: DC | PRN

## 2016-05-24 MED ORDER — DEXTROSE 5 % IV SOLN
1.0000 g | INTRAVENOUS | Status: DC
  Administered 2016-05-24 – 2016-05-30 (×6): 1 g via INTRAVENOUS
  Filled 2016-05-24 (×7): qty 10

## 2016-05-24 MED ORDER — LACTULOSE 10 GM/15ML PO SOLN
20.0000 g | Freq: Every day | ORAL | Status: DC
  Administered 2016-05-25: 20 g via ORAL
  Filled 2016-05-24 (×2): qty 30

## 2016-05-24 MED ORDER — HYDROXYZINE HCL 25 MG PO TABS
25.0000 mg | ORAL_TABLET | Freq: Three times a day (TID) | ORAL | Status: DC | PRN
  Administered 2016-05-25: 25 mg via ORAL
  Filled 2016-05-24: qty 1

## 2016-05-24 MED ORDER — ALLOPURINOL 100 MG PO TABS
100.0000 mg | ORAL_TABLET | Freq: Every day | ORAL | Status: DC
  Administered 2016-05-24 – 2016-05-25 (×2): 100 mg via ORAL
  Filled 2016-05-24 (×2): qty 1

## 2016-05-24 MED ORDER — SODIUM CHLORIDE 0.9 % IV SOLN
INTRAVENOUS | Status: DC
  Administered 2016-05-24: 13:00:00 via INTRAVENOUS

## 2016-05-24 MED ORDER — ATORVASTATIN CALCIUM 40 MG PO TABS
40.0000 mg | ORAL_TABLET | Freq: Every day | ORAL | Status: DC
  Administered 2016-05-24 – 2016-05-25 (×2): 40 mg via ORAL
  Filled 2016-05-24 (×2): qty 1

## 2016-05-24 MED ORDER — SODIUM CHLORIDE 0.9 % IV SOLN
25.0000 ug/h | INTRAVENOUS | Status: DC
  Administered 2016-05-24: 25 ug/h via INTRAVENOUS
  Filled 2016-05-24 (×8): qty 1

## 2016-05-24 MED ORDER — HYDROXYZINE HCL 25 MG PO TABS
50.0000 mg | ORAL_TABLET | Freq: Every day | ORAL | Status: DC
  Administered 2016-05-24: 50 mg via ORAL
  Filled 2016-05-24: qty 2

## 2016-05-24 NOTE — Consult Note (Signed)
Consultation  Referring Provider:  Dr. Allyson Sabal    Primary Care Physician:  Marco Collie, MD Primary Gastroenterologist:  Dr. Carlean Purl       Reason for Consultation:   Melena, Anemia           HPI:   Cassandra Bonilla is a 75 y.o. African-American female with a past medical history significant for type 2 diabetes, insulin-requiring, gout, status post cholecystectomy, history of CAD status post 4 vessel CABG and subsequent stents placed, pulmonary hypertension, CHF (last EF 45%), A. fib and new diagnosis of liver disease at time of last admission 04/26/16.  Admission on 04/26/16 we will consult on for abnormal liver imaging and elevated alkaline phosphatase. Liver biopsy was completed on 05/01/16 showing hepatic sinusoidal dilatation with vague focal noncaseating granulomatous inflammation suspected due to CHF vs renal cause. That time she was discharged on the 05/03/16 to a rehabilitation facility on warfarin 4 mg by mouth daily and Lasix 120 mg by mouth twice a day. Her ammonia had remained elevated despite lactulose and rifaximin was started, alkaline phosphatase was elevated with pruritus, ANA mildly elevated, mitochondrial antibodies negative, viral hepatitis panel negative.  Today, the patient is found somnolent in bed, she is very hard to arouse and much of the history is garnered from her family member at her bedside. She was sent to [facility] this morning from [facility] chief complaint of melena and anemia. It is noted that she presented to [facility] with a hemoglobin of 6.2. She was also noted to have the supra therapeutic INR of 3.7. Her husband tells me that she developed melena about a week ago, describing this as "brownish stool with maroonish blood". She started to get weaker and weaker and was "not eating as much", became nauseous and had a few episodes of vomiting and yesterday night around 7 PM slid down against the wall to the floor and became unresponsive. He brought her to the [facility]. She was given vitamin K and 2 units of fresh frozen plasma and 1 unit of blood and transferred to [facility] for further evaluation. She was also found to have an ammonia level of 107 was given 300 mg per rectum lactulose enema. Lactic acid was 8.1 troponin 0.10.  Recent GI/Medical history: (Patient with recent admission, much of the history garnered from previous consult note) Anemia and FOBT + stool in 01/2016.  At [facility] [place] transfused 2 PRBCs and had unremarkable EGD and aborted colonoscopy due to poor prep.  No previous endo/colons before 01/2016.  BID Protonix initiated then, though has no significant reflux, pyrosis, excessive belching, dysphagia.   Hx abnormal LFTs but no definitive diagnosis of cirrhosis.  Per Care Everywhere from [facility] system 10/2015: Ultrasound 10/2015: liver is slightly heterogeneous and echogenic, which limits evaluation of small masses. No focal masses are seen. There is no evidence of intrahepatic biliary ductal dilation. There is mild perihepatic ascites.  0.3 mm CBD.  Mild perihepatic ascites.   MRI/MRCP 10/2015 for pruritus, abnormal LFTs; concern for cholestasis, PBC or PSC:   Markedly limited study, due to excessive motion and premature termination of the study (MRCP sequences were not obtained).Within this limitation there is no evidence of intra or extra hepatic biliary ductal dilatation or definite evidence of cirrhosis Labs: 10/2015 elevated smooth muscle Ab IgA and IgG.   Elevated C4 complement.   Normal Mitochondrial Ab and Hepatitis serologies in 10/2015 and again in 04/2016.    Normal immunofixation electrophoresis.  She drank wine rarely in past. No tylenol.  CKD followed for a few years by renal specialist.  Has had pruritus since at least 02/2016 attributed to uremia, improved marginally with Zyrtec and a topical cream.  Several admissions over last year for: CHF, hypoglycemia, ? TIA (ruled out for CVA), hypokalemia.    Admission 7/7 - 03/24/16: with hypokalemia and pruritus. ALk phos was 220, transaminases and t bili normal.   Discharged on Atarax, Ferrous Sulfate, BID Protonix.  At discharge she was given phone # for [facility] to call and arrange outpt colonoscopy but no hemoccult testing performed.    Admission 8/5 with pruritus, increased edema and weight gain, recurrent hypokalemia, somnolence and mild to moderate confusion.  Admission not says she was taking Atarax but husbands med list shows Cetirizine (zyrtec).  Alk phos 280, T bili to 1.4normal transaminases.  Ammonia 51, started on Lactulose and Ammonia drifted to 40s and now to 81.  ANA AB positive, homogeneous patttern of 1:80.  Mitochondrial Ab 12.2 (rr is 0 - 20) Abdominal Ultrasound shows "echogenic portal venous walls... This may be related to a component of underlying hepatitis", s/p cholecystectomy. 2.3 mm CBD. Renal ultrasound shows ascites.  Coumadin restarted for A fib.  Renal ordered vein mapping but no emergent need for dialysis.   Past Medical History:  Diagnosis Date  . CHF (congestive heart failure) ([place])   . CKD (chronic kidney disease) stage 3, GFR 30-59 ml/min   . COPD (chronic obstructive pulmonary disease) ([place])   . Coronary artery disease   . Diabetes mellitus without complication ([place])   . Gout   . Hypertension   . Hypokalemia 03/21/2016    Past Surgical History:  Procedure Laterality Date  . CAROTID ENDARTERECTOMY    . CESAREAN SECTION    . CHOLECYSTECTOMY    . CORONARY ANGIOPLASTY    . CORONARY ARTERY BYPASS GRAFT      Family History  Problem Relation Age of Onset  . Breast cancer Mother     Social History  Substance Use Topics  . Smoking status: Former Research scientist (life sciences)  . Smokeless tobacco: Never Used     Comment: quit smoking  in 1993  . Alcohol use No    Prior to Admission medications   Medication Sig Start Date End Date Taking? Authorizing Provider  acetaminophen (TYLENOL) 650 MG CR tablet Take 1,300 mg by  mouth at bedtime as needed for pain.    Historical Provider, MD  allopurinol (ZYLOPRIM) 100 MG tablet Take 100 mg by mouth daily.    Historical Provider, MD  amLODipine (NORVASC) 10 MG tablet Take 10 mg by mouth daily.    Historical Provider, MD  aspirin EC 81 MG tablet Take 81 mg by mouth daily.    Historical Provider, MD  atorvastatin (LIPITOR) 40 MG tablet Take 40 mg by mouth daily.    Historical Provider, MD  enoxaparin (LOVENOX) 80 MG/0.8ML injection Inject 0.7 mLs (70 mg total) into the skin daily. 05/03/16   Kelvin Cellar, MD  furosemide (LASIX) 40 MG tablet Take 3 tablets (120 mg total) by mouth 2 (two) times daily. 05/03/16   Kelvin Cellar, MD  guaiFENesin-codeine [facility]) 100-10 MG/5ML syrup Take 10 mLs by mouth 3 (three) times daily as needed for cough.    Historical Provider, MD  hydrOXYzine (ATARAX/VISTARIL) 25 MG tablet Take 1-2 tablets (25-50 mg total) by mouth every 6 (six) hours as needed for itching. Patient taking differently: Take 25-50 mg by mouth See admin instructions.  Take 2 tablets (50 mg) by mouth daily at bedtime, may also take 1 tablet (25 mg) 3 times during the day as needed for itching 03/24/16   Hosie Poisson, MD  insulin glargine (LANTUS) 100 UNIT/ML injection Inject 5 Units into the skin every morning.     Historical Provider, MD  insulin lispro (HUMALOG) 100 UNIT/ML injection Inject 8-10 Units into the skin at bedtime. Takes 8 units if CBG <400, takes 10 units if CBG >400     Historical Provider, MD  lactulose (CHRONULAC) 10 GM/15ML solution Take 30 mLs (20 g total) by mouth 2 (two) times daily. 05/03/16   Kelvin Cellar, MD  metolazone (ZAROXOLYN) 5 MG tablet Take 5 mg by mouth daily.     Historical Provider, MD  metoprolol succinate (TOPROL-XL) 50 MG 24 hr tablet Take 1 tablet (50 mg total) by mouth daily. Take with or immediately following a meal. 05/03/16   Kelvin Cellar, MD  pantoprazole (PROTONIX) 40 MG tablet Take 40 mg by mouth daily.     Historical  Provider, MD  potassium chloride SA (K-DUR,KLOR-CON) 20 MEQ tablet Take 2 tablets (40 mEq total) by mouth daily. 05/03/16   Kelvin Cellar, MD  rifaximin (XIFAXAN) 200 MG tablet Take 2 tablets (400 mg total) by mouth 3 (three) times daily. 05/03/16   Kelvin Cellar, MD  warfarin (COUMADIN) 4 MG tablet Take 1 tablet (4 mg total) by mouth daily at 6 PM. 05/04/16   Kelvin Cellar, MD    Current Facility-Administered Medications  Medication Dose Route Frequency Provider Last Rate Last Dose  . 0.9 %  sodium chloride infusion   Intravenous Once Ivor Costa, MD      . 0.9 %  sodium chloride infusion   Intravenous Once Reyne Dumas, MD      . 0.9 %  sodium chloride infusion   Intravenous Continuous Reyne Dumas, MD      . acetaminophen (TYLENOL) tablet 650 mg  650 mg Oral Q6H PRN Reyne Dumas, MD       Or  . acetaminophen (TYLENOL) suppository 650 mg  650 mg Rectal Q6H PRN Reyne Dumas, MD      . cefTRIAXone (ROCEPHIN) 1 g in dextrose 5 % 50 mL IVPB  1 g Intravenous Q24H Ivor Costa, MD   1 g at 05/24/16 0616  . insulin aspart (novoLOG) injection 0-9 Units  0-9 Units Subcutaneous TID WC Reyne Dumas, MD   2 Units at 05/24/16 0853  . insulin glargine (LANTUS) injection 3 Units  3 Units Subcutaneous Daily Ivor Costa, MD      . lactulose (CHRONULAC) 10 GM/15ML solution 20 g  20 g Oral Daily Reyne Dumas, MD      . lactulose (CHRONULAC) enema 200 gm  300 mL Rectal BID Ivor Costa, MD      . metoprolol (LOPRESSOR) injection 2.5 mg  2.5 mg Intravenous Q8H Ivor Costa, MD   2.5 mg at 05/24/16 0616  . octreotide (SANDOSTATIN) 2 mcg/mL load via infusion 50 mcg  50 mcg Intravenous Once Ivor Costa, MD       And  . octreotide (SANDOSTATIN) 500 mcg in sodium chloride 0.9 % 250 mL (2 mcg/mL) infusion  25-50 mcg/hr Intravenous Continuous Ivor Costa, MD      . ondansetron (ZOFRAN) tablet 4 mg  4 mg Oral Q6H PRN Reyne Dumas, MD       Or  . ondansetron (ZOFRAN) injection 4 mg  4 mg Intravenous Q6H PRN Reyne Dumas, MD      .  pantoprazole (PROTONIX) 80 mg in sodium chloride 0.9 % 250 mL (0.32 mg/mL) infusion  8 mg/hr Intravenous Continuous Ivor Costa, MD 25 mL/hr at 05/24/16 0948 8 mg/hr at 05/24/16 0948    Allergies as of 05/23/2016 - Review Complete 04/28/2016  Allergen Reaction Noted  . Contrast media  [iodinated diagnostic agents] Hives 01/20/2013  . Prednisone Other (See Comments) 01/20/2013     Review of Systems:   ( received from husband) Constitutional: Positive for weakness and fatigue No weight loss, fever or chills HEENT: Eyes: No change in vision               Ears, Nose, Throat:  No change in hearing or congestion Skin: No rash or itching Cardiovascular: No chest pain, chest pressure or palpitations Respiratory: No SOB or cough Gastrointestinal: See HPI and otherwise negative Genitourinary: No dysuria or change in urinary frequency Neurological: Positive for suspected syncope No headache or dizziness Musculoskeletal: Positive for leg swelling No new muscle or joint pain Hematologic: See history of present illness  Psychiatric: No history of depression or anxiety   Physical Exam:  Vital signs in last 24 hours: Temp:  [96 F (35.6 C)-98.7 F (37.1 C)] 98.7 F (37.1 C) (09/08 0925) Pulse Rate:  [74-77] 77 (09/08 0925) Resp:  [12-17] 17 (09/08 0925) BP: (107-124)/(56-66) 120/59 (09/08 0925) SpO2:  [98 %-99 %] 98 % (09/08 0925) Weight:  [164 lb 10.9 oz (74.7 kg)] 164 lb 10.9 oz (74.7 kg) (09/08 0329)   General:  Chronically ill-appearing, elderly African-American female lethargic and unable to stay awake for questioning Head:  Normocephalic and atraumatic. Eyes:   PEERL, EOMI. No icterus. Conjunctiva pink. Ears:  Normal auditory acuity. Neck:  Supple Throat: Oral cavity and pharynx without inflammation, swelling or lesion.  Lungs: Respirations even and unlabored. Lungs clear to auscultation bilaterally.   No wheezes, crackles, or rhonchi.  Heart: Normal S1, S2. No MRG. Regular rate and  rhythm. No peripheral edema, cyanosis or pallor.  Abdomen:  Soft, nondistended, nontender. No rebound or guarding. Normal bowel sounds. No appreciable masses or hepatomegaly. Rectal:  Not performed.  Msk:  Symmetrical without gross deformities. Peripheral pulses intact.  Extremities:  Without edema, no deformity or joint abnormality. Normal ROM, normal sensation. Neurologic:  Lethargic unable to fully assess Skin:   Dry and intact without significant lesions or rashes. Psychiatric: Lethargic, unable to establish orientation   LAB RESULTS: Most recent labs completed at [facility]- repeat labs pending  Recent Labs  05/24/16 0711  LABPROT 29.9*  INR 2.78    STUDIES: Portable Chest 1 View  Result Date: 05/24/2016 CLINICAL DATA:  Altered mental status, bloody stool. Former smoker. History of CHF, hypertension, coronary artery disease and diabetes. EXAM: PORTABLE CHEST 1 VIEW COMPARISON:  05/23/2016, 04/28/2016 FINDINGS: Semi-erect AP portable view chest. Patient is slightly rotated. There are low lung volumes. There is evidence of previous sternotomy with sternal wires and surgical clips. Surgical clips at the left upper mediastinum are also noted. There is continued enlargement of the cardiomediastinal silhouette with dense atherosclerosis of the aorta. There are small bilateral effusions. Slight interval increase in patchy perihilar opacities perforating no pneumothorax. A linear opacity at the left base which could represent pleural calcification is again noted. IMPRESSION: 1. Stable enlargement of the cardiomediastinal silhouette with mild central vascular congestion present. 2. Low lung volumes with development of tiny bilateral effusions. Slight interval increase in perihilar patchy airspace opacities. Electronically Signed   By: Donavan Foil M.D.   On:  05/24/2016 09:39     PREVIOUS ENDOSCOPIES:            See history of present illness, [place] of state. No recent   Impression  / Plan:   Impression: 1. Melena: Likely due to supratherapeutic INR, currently 2.78 after vitamin K and 2 units of fresh frozen plasma while [facility], patient's husband reports bloody bowel movements 1 week, hemoglobin at time of presentation 6.2 2. Acute on chronic systolic and right-sided CHF: Last EF 35% on 04/21/16 pulmonary hypertension, Lasix being held in setting of GI bleed 3. Acute on chronic renal insufficiency; CKD IV-V: Baseline creatinine around 3.12 4. Elevated alkaline phosphatase, elevated ammonia level: This is been previously evaluated including liver biopsy, thought to be related to cirrhosis and heart failure 5. Encephalopathy, acute: Ammonia level elevated at 107, patient lethargic unable to assess for asterixis, likely secondary to liver disease 6. A. fib 7. Hypokalemia 8. Insulin-dependent diabetes 9. Coronary artery disease with hypertension: History of CABG, status post stent placement   Plan: 1. Agree with Protonix drip as well as octreotide drip. 2. Continue to monitor hemoglobin and transfusion as needed 3. Agree with holding Coumadin 4. Will discuss need of lactulose and/or Xifaxan with Dr. Henrene Pastor 5. Likely patient would benefit from an EGD in the near future, though would need to wait until INR is appropriate. Will tentatively scheduled EGD with Dr. Collene Mares tomorrow at 9 am with stat INR at 6 am. 6. Will discuss above with Dr. Henrene Pastor, please await any further recommendations  Thank you for your kind consultation, we will continue to follow.  Lavone Nian Lemmon  05/24/2016, 10:00 AM Pager #: [phone]  GI ATTENDING  History, laboratories, x-rays, recent hospitalization reviewed. Patient personally seen and examined. Agree with comprehensive consultation note as outlined above. Patient with multiple significant cardiac problems who presents with hemodynamically stable upper GI bleed in the face of chronic anticoagulation. The patient has received blood  transfusions. No CBC since admission here. INR still elevated. Agree with IV PPI and holding Coumadin. Anticipate upper endoscopy tomorrow with Dr. Collene Mares if INR acceptable. Patient is high-risk given her age and comorbidities.The nature of the procedure, as well as the risks, benefits, and alternatives were carefully and thoroughly reviewed with the patient. Ample time for discussion and questions allowed. The patient understood, was satisfied, and agreed to proceed.  Docia Chuck. Geri Seminole., M.D. [facility] Division of Gastroenterology

## 2016-05-24 NOTE — Progress Notes (Signed)
This is a no charge note  Pending admission:  75 year old lady with a past medical history of afib on Coumadin, hypertension, hyperlipidemia, diabetes mellitus, liver cirrhosis, congestive heart failure, CAD, CAD, s/p of CABG, CKD-III, who is transferred from [facility] due to GI bleeding and possible hepatic encephalopathy with ammonia 107.  She is anticoagulated with warfarin with INR 3.7 tonight; vitamin K 5 mg and 2U of FFP were given. 1U of blood was transferred. She was started with IV Rocephin. 2L of NS was given due to elevated lactic acid 8.1. She has also received lactulose 300 mg per rectum. Currently, blood pressure 120/CT 6, heart rates and 72, DISSECTION 99, temperature 96.   I put in some temporary order, including IV octreotide drip, Protonix drip, CBC every 6 hours. Her troponin is elevated at 0.10-->check trop x 3 and 2d echo. Hold home oral meds. Started IV metoprolol with holding parameters. Continue lactulose per rectal. Check INR/PTT and type screen, lactic acid.  Will order another unit of blood now.   Ivor Costa, MD  [facility] Pager [phone]  If 7PM-7AM, please contact night-coverage [email or web address] Password [email or web address] 05/24/2016, 4:58 AM

## 2016-05-24 NOTE — Progress Notes (Signed)
CRITICAL VALUE ALERT  Critical value received:  Lactic Acid 3.7  Date of notification:  05/24/16  Time of notification:  0813  Critical value read back:No.  Nurse who received alert:  Rich Reining  MD notified (1st page):  Abrol  Time of first page:  0819  MD notified (2nd page): Abrol  Time of second page: [id number]  Responding MD:  Allyson Sabal  Time MD responded:  [phone]

## 2016-05-24 NOTE — Care Management Note (Addendum)
Case Management Note  Patient Details  Name: Cassandra Bonilla MRN: [id number] Date of Birth: 02-12-1941  Subjective/Objective:  Presents with GIB, AMS, lethargic, Patient was previously in [place] [facility] from last admission for 8-10 days then went home with spouse,  she has a rolling walker, a cane, and w/chair and a bsc.  She states she uses the w/chair mostly. Most likely will need pt eval.                     Action/Plan:   Expected Discharge Date:                  Expected Discharge Plan:  [place]  In-House Referral:     Discharge planning Services  CM Consult  Post Acute Care Choice:    Choice offered to:     DME Arranged:    DME Agency:     HH Arranged:    HH Agency:     Status of Service:     If discussed at [employer], dates discussed:    Additional Comments:  Zenon Mayo, RN 05/24/2016, 4:17 PM

## 2016-05-24 NOTE — Progress Notes (Signed)
Attempted to get patient consent for EGD but patient's family report that they don't know anything about the procedure and that the MD has not explained anything to them. They are requesting to speak to MD. Paged GI service pager without a response. Unable to obtain consent at this time.

## 2016-05-24 NOTE — H&P (Addendum)
[facility] History and Physical  Breckin Zafar ELF:[id number] DOB: [birth year] DOA: 05/24/2016  Referring physician:   PCP: Marco Collie, MD   Chief Complaint: Melena   HPI:  75 year old African-American female with past medical history of chronic atrial fibrillation on Coumadin, chronic systolic heart failure with EF 45%, chronic anemia, CKD IV-V, HTN, HLD, DM, carotid artery stenosis s/p CEA and CAD s/p 4v CABG 1993 presented with melena and a hemoglobin of 6.2. Patient was recently hospitalized in [facility] between 8/5 and 8/18 for hypokalemia, acute on chronic systolic and right-sided heart failure, and discharged on Lasix 120 mg twice a day. Patient also had acute kidney injury, that slowly improved. Patient was also treated for hepatic encephalopathy and discharged on lactulose and rifaximin. 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 She underwent liver biopsy on 8/16, awaiting pathology and was supposed to follow-up with [facility]. Patient also has atrial fibrillation and was discharged on Lovenox and Coumadin.    Patient was discharged to SNF on 8/18 were she stayed about 8-10 days prior to being discharged home with husband. Patient supposedly had a supratherapeutic INR few days back. Husband noted that the patient developed melena about a week ago. Patient continued to get weaker, decreased by mouth intake, nauseous and more lethargic. Brought into ER at [facility] and she was found to have INR of 3.7. She was given vitamin K and 2 units of fresh frozen plasma and 1 unit of blood and transferred to [facility] cone for further evaluation. Hemoglobin upon presentation was 6.2. Patient was also found to have ammonia level of 107, and was given 300 mg per rectum of lactulose enema. Lactic acid was 8.1, troponin 0.10.  Patient transferred to [facility] cone for further evaluation       Review of Systems: negative for the following  Constitutional: Denies fever, chills, diaphoresis,  appetite change and fatigue.  HEENT: Denies photophobia, eye pain, redness, hearing loss, ear pain, congestion, sore throat, rhinorrhea, sneezing, mouth sores, trouble swallowing, neck pain, neck stiffness and tinnitus.  Respiratory: Denies SOB, DOE, cough, chest tightness, and wheezing.  Cardiovascular: Denies chest pain, palpitations and leg swelling.  Gastrointestinal: Positive for nausea, vomiting, abdominal pain, diarrhea, constipation, blood in stool and abdominal distention.  Genitourinary: Denies dysuria, urgency, frequency, hematuria, flank pain and difficulty urinating.  Musculoskeletal: Denies myalgias, back pain, joint swelling, arthralgias and gait problem. Positive for leg swelling Skin: Denies pallor, rash and wound.  Neurological: Denies dizziness, seizures, syncope, weakness, light-headedness, numbness and headaches.  Hematological: Denies adenopathy. Easy bruising, personal or family bleeding history  Psychiatric/Behavioral: Denies suicidal ideation, mood changes, confusion, nervousness, sleep disturbance and agitation       Past Medical History:  Diagnosis Date  . CHF (congestive heart failure) ([place])   . CKD (chronic kidney disease) stage 3, GFR 30-59 ml/min   . COPD (chronic obstructive pulmonary disease) ([place])   . Coronary artery disease   . Diabetes mellitus without complication ([place])   . Gout   . Hypertension   . Hypokalemia 03/21/2016     Past Surgical History:  Procedure Laterality Date  . CAROTID ENDARTERECTOMY    . CESAREAN SECTION    . CHOLECYSTECTOMY    . CORONARY ANGIOPLASTY    . CORONARY ARTERY BYPASS GRAFT        Social History:  reports that she has quit smoking. She has never used smokeless tobacco. She reports that she does not drink alcohol or use drugs.    Allergies  Allergen  Reactions  . Contrast Media  [Iodinated Diagnostic Agents] Hives  . Prednisone Other (See Comments)    Confusion    Family History  Problem Relation Age of  Onset  . Breast cancer Mother          Prior to Admission medications   Medication Sig Start Date End Date Taking? Authorizing Provider  acetaminophen (TYLENOL) 650 MG CR tablet Take 1,300 mg by mouth at bedtime as needed for pain.    Historical Provider, MD  allopurinol (ZYLOPRIM) 100 MG tablet Take 100 mg by mouth daily.    Historical Provider, MD  amLODipine (NORVASC) 10 MG tablet Take 10 mg by mouth daily.    Historical Provider, MD  aspirin EC 81 MG tablet Take 81 mg by mouth daily.    Historical Provider, MD  atorvastatin (LIPITOR) 40 MG tablet Take 40 mg by mouth daily.    Historical Provider, MD  enoxaparin (LOVENOX) 80 MG/0.8ML injection Inject 0.7 mLs (70 mg total) into the skin daily. 05/03/16   Kelvin Cellar, MD  furosemide (LASIX) 40 MG tablet Take 3 tablets (120 mg total) by mouth 2 (two) times daily. 05/03/16   Kelvin Cellar, MD  guaiFENesin-codeine [facility]) 100-10 MG/5ML syrup Take 10 mLs by mouth 3 (three) times daily as needed for cough.    Historical Provider, MD  hydrOXYzine (ATARAX/VISTARIL) 25 MG tablet Take 1-2 tablets (25-50 mg total) by mouth every 6 (six) hours as needed for itching. Patient taking differently: Take 25-50 mg by mouth See admin instructions. Take 2 tablets (50 mg) by mouth daily at bedtime, may also take 1 tablet (25 mg) 3 times during the day as needed for itching 03/24/16   Hosie Poisson, MD  insulin glargine (LANTUS) 100 UNIT/ML injection Inject 5 Units into the skin every morning.     Historical Provider, MD  insulin lispro (HUMALOG) 100 UNIT/ML injection Inject 8-10 Units into the skin at bedtime. Takes 8 units if CBG <400, takes 10 units if CBG >400     Historical Provider, MD  lactulose (CHRONULAC) 10 GM/15ML solution Take 30 mLs (20 g total) by mouth 2 (two) times daily. 05/03/16   Kelvin Cellar, MD  metolazone (ZAROXOLYN) 5 MG tablet Take 5 mg by mouth daily.     Historical Provider, MD  metoprolol succinate (TOPROL-XL) 50 MG 24 hr  tablet Take 1 tablet (50 mg total) by mouth daily. Take with or immediately following a meal. 05/03/16   Kelvin Cellar, MD  pantoprazole (PROTONIX) 40 MG tablet Take 40 mg by mouth daily.     Historical Provider, MD  potassium chloride SA (K-DUR,KLOR-CON) 20 MEQ tablet Take 2 tablets (40 mEq total) by mouth daily. 05/03/16   Kelvin Cellar, MD  rifaximin (XIFAXAN) 200 MG tablet Take 2 tablets (400 mg total) by mouth 3 (three) times daily. 05/03/16   Kelvin Cellar, MD  warfarin (COUMADIN) 4 MG tablet Take 1 tablet (4 mg total) by mouth daily at 6 PM. 05/04/16   Kelvin Cellar, MD     Physical Exam: Vitals:   05/24/16 0329 05/24/16 0650 05/24/16 0700  BP: 120/66 107/65 (!) 124/56  Pulse:  74 75  Resp:  15 12  Temp: (!) 96 F (35.6 C) 97.3 F (36.3 C) 97.5 F (36.4 C)  TempSrc: Axillary Axillary Axillary  SpO2:  98% 99%  Weight: 74.7 kg (164 lb 10.9 oz)    Height: 5' 2"  (1.575 m)        Constitutional: NAD, calm, comfortable Vitals:  05/24/16 0329 05/24/16 0650 05/24/16 0700  BP: 120/66 107/65 (!) 124/56  Pulse:  74 75  Resp:  15 12  Temp: (!) 96 F (35.6 C) 97.3 F (36.3 C) 97.5 F (36.4 C)  TempSrc: Axillary Axillary Axillary  SpO2:  98% 99%  Weight: 74.7 kg (164 lb 10.9 oz)    Height: 5' 2"  (1.575 m)     General appearance:  Chronically ill-appearing, elderly adult female, alert and in no acute distress, appears chronically debilitated.  Eyes: Anicteric, conjunctiva pink, lids and lashes normal.    ENT: No nasal deformity, discharge, or epistaxis.  OP moist without lesions.  Skin: Warm and dry.  No suspicious rashes or lesions. Wine bottle deformity of both lower extremities from venous insuff.   Cardiac: Tachcyardic, nl D5-W8, systolic murmurs appreciated. Capillary refill is brisk.  JVP distended.  Pitting thigh edema bilaterally.  Radial and DP pulses 2+ and symmetric. Respiratory: Normal respiratory rate and rhythm.  No wheezes or rales.. Abdomen: Abdomen  soft without rigidity.  No TTP.No ascites, distension.   MSK: No deformities or effusions. Neuro: Cranial nerves intact. Sensorium intact and responding to questions, attention normal. Speech is fluent.  Moves all extremities equally and with normal coordination.    Psych: Behavior appropriate.  Affect noraml.  No evidence of aural or visual hallucinations or delusions     Labs on Admission: I have personally reviewed following labs and imaging studies  CBC: No results for input(s): WBC, NEUTROABS, HGB, HCT, MCV, PLT in the last 168 hours.  Basic Metabolic Panel: No results for input(s): NA, K, CL, CO2, GLUCOSE, BUN, CREATININE, CALCIUM, MG, PHOS in the last 168 hours.  GFR: CrCl cannot be calculated (Patient's most recent lab result is older than the maximum 21 days allowed.).  Liver Function Tests: No results for input(s): AST, ALT, ALKPHOS, BILITOT, PROT, ALBUMIN in the last 168 hours. No results for input(s): LIPASE, AMYLASE in the last 168 hours. No results for input(s): AMMONIA in the last 168 hours.  Coagulation Profile:  Recent Labs Lab 05/24/16 0711  INR 2.78   No results for input(s): DDIMER in the last 72 hours.  Cardiac Enzymes:  Recent Labs Lab 05/24/16 0523  TROPONINI 0.08*    BNP (last 3 results) No results for input(s): PROBNP in the last 8760 hours.  HbA1C: No results for input(s): HGBA1C in the last 72 hours. Lab Results  Component Value Date   HGBA1C 7.7 (H) 04/20/2016     CBG: No results for input(s): GLUCAP in the last 168 hours.  Lipid Profile:  Recent Labs  05/24/16 0523  CHOL 123  HDL 38*  LDLCALC 59  TRIG 129  CHOLHDL 3.2    Thyroid Function Tests: No results for input(s): TSH, T4TOTAL, FREET4, T3FREE, THYROIDAB in the last 72 hours.  Anemia Panel: No results for input(s): VITAMINB12, FOLATE, FERRITIN, TIBC, IRON, RETICCTPCT in the last 72 hours.  Urine analysis:    Component Value Date/Time   COLORURINE YELLOW  04/23/2016 0015   APPEARANCEUR CLOUDY (A) 04/23/2016 0015   LABSPEC 1.009 04/23/2016 0015   PHURINE 7.0 04/23/2016 0015   GLUCOSEU NEGATIVE 04/23/2016 0015   HGBUR NEGATIVE 04/23/2016 0015   BILIRUBINUR NEGATIVE 04/23/2016 0015   KETONESUR NEGATIVE 04/23/2016 0015   PROTEINUR 30 (A) 04/23/2016 0015   NITRITE NEGATIVE 04/23/2016 0015   LEUKOCYTESUR NEGATIVE 04/23/2016 0015    Sepsis Labs: @LABRCNTIP (procalcitonin:4,lacticidven:4) )No results found for this or any previous visit (from the past 240 hour(s)).  Radiological Exams on Admission: No results found. Dg Chest 2 View  Result Date: 04/28/2016 CLINICAL DATA:  Productive cough today. No shortness of breath or chest pain. EXAM: CHEST  2 VIEW COMPARISON:  04/25/2016 FINDINGS: Stable changes from cardiac surgery. Cardiac silhouette is mildly enlarged. No mediastinal or hilar masses. Small pleural effusions. Streaky opacity at the lung bases most likely atelectasis. No convincing pneumonia or pulmonary edema. No pneumothorax. The bony thorax is demineralized but grossly intact. IMPRESSION: 1. No significant change from the prior study. 2. Cardiomegaly with small effusions but no convincing pulmonary edema. 3. Streaky opacity at the lung bases most likely atelectasis. No convincing pneumonia. Electronically Signed   By: Lajean Manes M.D.   On: 04/28/2016 13:22   Dg Chest 2 View  Result Date: 04/25/2016 CLINICAL DATA:  Pt states she is here for pneumonia, has a very productive cough, , hx htn, chf, copd, , diabetes EXAM: CHEST  2 VIEW COMPARISON:  04/21/2016 FINDINGS: Changes from previous cardiac surgery stable. Cardiac silhouette is mildly enlarged. No mediastinal or hilar masses or evidence of adenopathy. There is streaky opacity at the lung bases. Lungs otherwise clear. There is a small left pleural effusion. No convincing pulmonary edema. No pneumothorax. Bony thorax is demineralized but grossly intact. IMPRESSION: 1. Streaky lung  base opacity best seen on the lateral view, most likely due to atelectasis. Pneumonia is not excluded but felt less likely. 2. Cardiomegaly and small left pleural effusion, but no evidence of pulmonary edema. Electronically Signed   By: Lajean Manes M.D.   On: 04/25/2016 18:38   Dg Knee 1-2 Views Left  Result Date: 04/27/2016 CLINICAL DATA:  Left lower extremity pain.  No reported injury. EXAM: LEFT KNEE - 1-2 VIEW COMPARISON:  None. FINDINGS: No fracture, significant joint effusion, dislocation or suspicious focal osseous lesion. Small marginal osteophyte at the inferior patella. Extensive vascular calcifications in the posterior soft tissues. Surgical clips in the medial soft tissues. IMPRESSION: No fracture, joint effusion or malalignment. Mild degenerative changes at the inferior patella. Electronically Signed   By: Ilona Sorrel M.D.   On: 04/27/2016 12:46   Ct Head Wo Contrast  Result Date: 04/29/2016 CLINICAL DATA:  75 year old female with a history of confusion EXAM: CT HEAD WITHOUT CONTRAST TECHNIQUE: Contiguous axial images were obtained from the base of the skull through the vertex without intravenous contrast. COMPARISON:  None. FINDINGS: Unremarkable appearance of the calvarium without acute fracture or aggressive lesion. Unremarkable appearance of the scalp soft tissues. Unremarkable appearance of the bilateral orbits. Air-fluid level of the right mastoid air cells which are partially opacified. Fluid extends into the middle ear. Fluid density involving the features apex, just anterior to the cochlea. No significant paranasal sinus disease No acute intracranial hemorrhage. No midline shift or mass effect. Unremarkable configuration the ventricles. Gray-white differentiation maintained. Calcifications of the intracranial vasculature. IMPRESSION: No CT evidence of acute intracranial abnormality. Partially opacified right-sided mastoid air cells with air-fluid level, potentially representing  mastoiditis. Right middle ear effusion. Intracranial atherosclerosis. Signed, Dulcy Fanny. Earleen Newport, DO [facility] Electronically Signed   By: Corrie Mckusick D.O.   On: 04/29/2016 16:04   Dg Lumbar Spine 1 View  Result Date: 04/27/2016 CLINICAL DATA:  Back pain EXAM: LUMBAR SPINE - 1 VIEW COMPARISON:  None FINDINGS: Single AP view of the lumbar spine. No lateral view for correlation. Marked osseous demineralization. Five lumbar vertebra. Facet degenerative changes lower lumbar spine. No gross acute lumbar spine abnormalities  identified on limited AP exam. Scattered atherosclerotic calcifications and pelvic phleboliths. Surgical clips RIGHT upper quadrant question cholecystectomy. IMPRESSION: Osseous demineralization with degenerative facet disease changes lower lumbar spine. No gross acute lumbar spine abnormality identified on single AP view. Aortic atherosclerosis. Electronically Signed   By: Lavonia Dana M.D.   On: 04/27/2016 12:47   US Renal  Result Date: 04/24/2016 CLINICAL DATA:  Stage 4 chronic kidney disease. EXAM: RENAL / URINARY TRACT ULTRASOUND COMPLETE COMPARISON:  April 22, 2016 complete abdomen ultrasound FINDINGS: Right Kidney: Length: 10.5 cm. There is diffuse increased echotexture of the right kidney. No mass or hydronephrosis visualized. Left Kidney: Length: 10.6 cm. There is diffuse increased echotexture of the left kidney. No mass or hydronephrosis visualized. Bladder: Not well seen with Foley catheter in place. There is incidental finding of right pleural effusion and ascites. IMPRESSION: No hydronephrosis identified bilaterally. Bilateral diffuse increased echotexture of the kidneys consistent with medical renal disease. Right pleural effusion and ascites. Electronically Signed   By: Abelardo Diesel M.D.   On: 04/24/2016 13:07   US Biopsy  Result Date: 05/01/2016 INDICATION: 75 year old female with a history of liver disease. She has  been referred for medical liver biopsy EXAM: ULTRASOUND BIOPSY CORE LIVER MEDICATIONS: None. ANESTHESIA/SEDATION: Moderate (conscious) sedation was employed during this procedure. A total of Versed 1.0 mg and Fentanyl 50 mcg was administered intravenously. Moderate Sedation Time: 15 minutes. The patient's level of consciousness and vital signs were monitored continuously by radiology nursing throughout the procedure under my direct supervision. FLUOROSCOPY TIME:  None COMPLICATIONS: None PROCEDURE: Informed written consent was obtained from the patient after a thorough discussion of the procedural risks, benefits and alternatives. All questions were addressed. Maximal Sterile Barrier Technique was utilized including caps, mask, sterile gowns, sterile gloves, sterile drape, hand hygiene and skin antiseptic. A timeout was performed prior to the initiation of the procedure. Patient is positioned supine position on the ultrasound table. Ultrasound images were performed of the upper abdomen. Patient is an prepped and draped in the usual sterile fashion. The skin and subcutaneous tissues were generously infiltrated 1% lidocaine for local anesthesia. Small stab incision was made with 11 blade scalpel. Using ultrasound guidance, a 38 gauge guide needle was advanced through the tissues, in a region where the ascites was not separating the body wall from the liver margin into the right liver lobe. Three separate 18 gauge core biopsy achieved. Three Gel-Foam pledgets were then infused. Needle was removed and a final image was stored. Patient tolerated the procedure well and remained hemodynamically stable throughout. No complications were encountered and no significant blood loss. IMPRESSION: Status post ultrasound-guided biopsy of right liver lobe for medical liver purpose. Tissue specimen sent to pathology for complete histopathologic analysis. Signed, Dulcy Fanny. Earleen Newport, DO [facility]  [facility] Electronically Signed   By: Corrie Mckusick D.O.   On: 05/01/2016 16:39   Dg Shoulder Left  Result Date: 04/30/2016 CLINICAL DATA:  Body aches and left shoulder joint region discomfort. No known injury, history of gout. EXAM: LEFT SHOULDER - 2+ VIEW COMPARISON:  Chest x-ray of April 28, 2016 which included portions of the left shoulder. FINDINGS: The bones are subjectively osteopenic. The glenohumeral joint space is preserved. There is mild inferior articular margin spurring of the glenoid. The [facility] joint exhibits mild narrowing with some acromial articular surface irregularity. The subacromial subdeltoid space is preserved. The observed portions of the left clavicle and upperleft ribs are normal. IMPRESSION: There is no  acute bony abnormality of the left shoulder. Mild degenerative change of the inferior rim of the glenoid and of the [facility] joint is present. Electronically Signed   By: David  Martinique M.D.   On: 04/30/2016 08:46   Dg Hip Unilat With Pelvis 2-3 Views Left  Result Date: 04/27/2016 CLINICAL DATA:  Hip pain EXAM: DG HIP (WITH OR WITHOUT PELVIS) 2-3V LEFT COMPARISON:  None FINDINGS: Osseous demineralization. Hip and SI joint spaces symmetric and preserved. No acute fracture, dislocation, or bone destruction. Extensive atherosclerotic calcification. Calcified pelvic phleboliths. IMPRESSION: No acute osseous abnormalities. Electronically Signed   By: Lavonia Dana M.D.   On: 04/27/2016 12:49      EKG: Independently reviewed.    Assessment/Plan Melena Likely the setting of supratherapeutic INR, cardiac cirrhosis Last seen by Dr. Carlean Purl on 04/28/16 Patient is on Protonix twice a day, currently on a Protonix drip, octreotide drip May need EGD , status post 2 units of FFP and 2 units of packed red blood cells Patient will be transfused second unit of packed red blood cells  Continue to hold Coumadin pending GI clearance, requested [facility] to consult   Acute on Chronic  systolic and right sided CHF: From CKD and right sided and systolic CHF EF 55-73% in May at last echocardiogram in [place]. Pulmonary HTN.  Not on lisinopril because of renal insufficiency.  -Echo performed 04/21/2016 showed  EF 35 %  She was discharged on Lasix 120 mg PO BID, hold Lasix in the setting of GI bleeding Abnormal troponin likely secondary to CHF,0.10>0.08  Acute on chronic renal insufficiency:CKD IV-V: Baseline around 3.12 3.2 Patient had a creatinine of 3.6 at [facility], sodium 128, potassium 3.6, repeat labs pending -Please follow up on BMP in 2-3 days, she was discharged on Lasix 120 mg PO BID  Encephalopathy, acute;  Likely secondary to elevated ammonia level, continue lactulose and rifaximin -Worsening mental status changes likely multifactorial, with acute on chronic renal failure, hyperammonemia, hospitalization, all probable contributors Check ABG    Elevated alk phos, elevated ammonia level; Pruritus:  [name] cholecystectomy and underline hepatic inflammatory changes.  Ammonia remain elevated despite lactulose, continue rifaximin  Alk phosphatase elevated, pruritus. ANA mildly elevated, mitochondrial antibodies negative.  viral hepatitis panel negative Denies abdominal pain.  GI consulted she underwent liver biopsy on 05/01/2016, awaiting pathology GI will be consulted   Atrial fibrillation: [place] consult at facility for warfarin dosing -She was discharged on Lovenox bridge x 5 days, with warfarin dosed at 4 mg PO q daily -Repeat PT/INR in 2-3 day  Hypokalemia: -Discharged on Kdur 40 meq PO q daily. She is on Lasix. Currently 3.6 -Repeat BMP 2-3 days  IDDM: -home glargine held initially, restarted on 8/11 5units qham -Sliding scale corrections, renal dose  Coronary artery disease and HTN: History of CABG, PCI 2, CEA. -Continue asa, amlodipine, furosemide, metolazone, metorpolol, and statin    DVT  prophylaxis:  Coumadin     Code Status Orders Full code        Family Communication: discussed with patient'sHusband By the bedside   Disposition Plan:  Anticipate discharge in 2-3 days pending further workup and clinical progress  Consults called: GI  Admission status: Inpatient, patient extremely ill and expected to decompensate, if not given appropriate length of stay  Total time spent 55 minutes.Greater than 50% of this time was spent in counseling, explanation of diagnosis, planning of further management, and coordination of care  [facility] MD [facility] Pager  [phone]  If 7PM-7AM, please contact night-coverage [email or web address] Password [facility]  05/24/2016, 8:26 AM

## 2016-05-25 ENCOUNTER — Inpatient Hospital Stay (HOSPITAL_COMMUNITY): Payer: Medicare Other

## 2016-05-25 DIAGNOSIS — K254 Chronic or unspecified gastric ulcer with hemorrhage: Secondary | ICD-10-CM

## 2016-05-25 LAB — RENAL FUNCTION PANEL
ALBUMIN: 2.7 g/dL — AB (ref 3.5–5.0)
ANION GAP: 14 (ref 5–15)
BUN: 124 mg/dL — ABNORMAL HIGH (ref 6–20)
CALCIUM: 7.6 mg/dL — AB (ref 8.9–10.3)
CO2: 25 mmol/L (ref 22–32)
CREATININE: 3.55 mg/dL — AB (ref 0.44–1.00)
Chloride: 96 mmol/L — ABNORMAL LOW (ref 101–111)
GFR, EST AFRICAN AMERICAN: 13 mL/min — AB (ref >60)
GFR, EST NON AFRICAN AMERICAN: 12 mL/min — AB (ref >60)
Glucose, Bld: 127 mg/dL — ABNORMAL HIGH (ref 65–99)
PHOSPHORUS: 5.9 mg/dL — AB (ref 2.5–4.6)
Potassium: 2.8 mmol/L — ABNORMAL LOW (ref 3.5–5.1)
SODIUM: 135 mmol/L (ref 135–145)

## 2016-05-25 LAB — URINALYSIS, ROUTINE W REFLEX MICROSCOPIC
BILIRUBIN URINE: NEGATIVE
Glucose, UA: NEGATIVE mg/dL
HGB URINE DIPSTICK: NEGATIVE
KETONES UR: NEGATIVE mg/dL
Leukocytes, UA: NEGATIVE
NITRITE: NEGATIVE
PROTEIN: NEGATIVE mg/dL
SPECIFIC GRAVITY, URINE: 1.014 (ref 1.005–1.030)
pH: 6 (ref 5.0–8.0)

## 2016-05-25 LAB — CBC
HEMATOCRIT: 27.6 % — AB (ref 36.0–46.0)
Hemoglobin: 8.9 g/dL — ABNORMAL LOW (ref 12.0–15.0)
MCH: 26.6 pg (ref 26.0–34.0)
MCHC: 32.2 g/dL (ref 30.0–36.0)
MCV: 82.4 fL (ref 78.0–100.0)
PLATELETS: 182 10*3/uL (ref 150–400)
RBC: 3.35 MIL/uL — ABNORMAL LOW (ref 3.87–5.11)
RDW: 17.2 % — AB (ref 11.5–15.5)
WBC: 8.5 10*3/uL (ref 4.0–10.5)

## 2016-05-25 LAB — CBC WITH DIFFERENTIAL/PLATELET
BASOS ABS: 0 10*3/uL (ref 0.0–0.1)
BASOS PCT: 1 %
EOS ABS: 0.1 10*3/uL (ref 0.0–0.7)
Eosinophils Relative: 1 %
HCT: 26.6 % — ABNORMAL LOW (ref 36.0–46.0)
HEMOGLOBIN: 8.5 g/dL — AB (ref 12.0–15.0)
Lymphocytes Relative: 10 %
Lymphs Abs: 0.8 10*3/uL (ref 0.7–4.0)
MCH: 26.5 pg (ref 26.0–34.0)
MCHC: 32 g/dL (ref 30.0–36.0)
MCV: 82.9 fL (ref 78.0–100.0)
MONOS PCT: 11 %
Monocytes Absolute: 0.8 10*3/uL (ref 0.1–1.0)
NEUTROS ABS: 5.9 10*3/uL (ref 1.7–7.7)
NEUTROS PCT: 77 %
Platelets: 191 10*3/uL (ref 150–400)
RBC: 3.21 MIL/uL — ABNORMAL LOW (ref 3.87–5.11)
RDW: 17.4 % — AB (ref 11.5–15.5)
WBC: 7.6 10*3/uL (ref 4.0–10.5)

## 2016-05-25 LAB — GLUCOSE, CAPILLARY
GLUCOSE-CAPILLARY: 71 mg/dL (ref 65–99)
GLUCOSE-CAPILLARY: 66 mg/dL (ref 65–99)
GLUCOSE-CAPILLARY: 148 mg/dL — AB (ref 65–99)
GLUCOSE-CAPILLARY: 112 mg/dL — AB (ref 65–99)
GLUCOSE-CAPILLARY: 126 mg/dL — AB (ref 65–99)
Glucose-Capillary: 121 mg/dL — ABNORMAL HIGH (ref 65–99)
Glucose-Capillary: 129 mg/dL — ABNORMAL HIGH (ref 65–99)
Glucose-Capillary: 129 mg/dL — ABNORMAL HIGH (ref 65–99)
Glucose-Capillary: 136 mg/dL — ABNORMAL HIGH (ref 65–99)
Glucose-Capillary: 51 mg/dL — ABNORMAL LOW (ref 65–99)
Glucose-Capillary: 74 mg/dL (ref 65–99)

## 2016-05-25 LAB — COMPREHENSIVE METABOLIC PANEL
ALBUMIN: 2.5 g/dL — AB (ref 3.5–5.0)
ALT: 21 U/L (ref 14–54)
AST: 34 U/L (ref 15–41)
Alkaline Phosphatase: 193 U/L — ABNORMAL HIGH (ref 38–126)
Anion gap: 14 (ref 5–15)
BILIRUBIN TOTAL: 1.6 mg/dL — AB (ref 0.3–1.2)
BUN: 133 mg/dL — AB (ref 6–20)
CO2: 26 mmol/L (ref 22–32)
CREATININE: 3.52 mg/dL — AB (ref 0.44–1.00)
Calcium: 7.7 mg/dL — ABNORMAL LOW (ref 8.9–10.3)
Chloride: 96 mmol/L — ABNORMAL LOW (ref 101–111)
GFR calc Af Amer: 14 mL/min — ABNORMAL LOW (ref >60)
GFR, EST NON AFRICAN AMERICAN: 12 mL/min — AB (ref >60)
GLUCOSE: 63 mg/dL — AB (ref 65–99)
POTASSIUM: 3.1 mmol/L — AB (ref 3.5–5.1)
Sodium: 136 mmol/L (ref 135–145)
TOTAL PROTEIN: 6.5 g/dL (ref 6.5–8.1)

## 2016-05-25 LAB — CREATININE, URINE, RANDOM: CREATININE, URINE: 28.19 mg/dL

## 2016-05-25 LAB — TROPONIN I: Troponin I: 0.12 ng/mL (ref <0.03)

## 2016-05-25 LAB — PROTIME-INR
INR: 2.26
Prothrombin Time: 25.4 seconds — ABNORMAL HIGH (ref 11.4–15.2)

## 2016-05-25 LAB — SODIUM, URINE, RANDOM: Sodium, Ur: 69 mmol/L

## 2016-05-25 LAB — HEMOGLOBIN A1C
Hgb A1c MFr Bld: 7.4 % — ABNORMAL HIGH (ref 4.8–5.6)
MEAN PLASMA GLUCOSE: 166 mg/dL

## 2016-05-25 MED ORDER — VITAMIN K1 10 MG/ML IJ SOLN
5.0000 mg | Freq: Once | INTRAVENOUS | Status: AC
  Administered 2016-05-25: 5 mg via INTRAVENOUS
  Filled 2016-05-25: qty 0.5

## 2016-05-25 MED ORDER — DEXTROSE 50 % IV SOLN
INTRAVENOUS | Status: AC
  Administered 2016-05-25: 50 mL
  Filled 2016-05-25: qty 50

## 2016-05-25 MED ORDER — CYPROHEPTADINE HCL 4 MG PO TABS
4.0000 mg | ORAL_TABLET | Freq: Once | ORAL | Status: DC
  Filled 2016-05-25: qty 1

## 2016-05-25 MED ORDER — METHOCARBAMOL 1000 MG/10ML IJ SOLN
500.0000 mg | Freq: Three times a day (TID) | INTRAVENOUS | Status: DC | PRN
  Administered 2016-05-27: 500 mg via INTRAVENOUS
  Filled 2016-05-25 (×4): qty 5

## 2016-05-25 MED ORDER — FUROSEMIDE 10 MG/ML IJ SOLN
80.0000 mg | Freq: Three times a day (TID) | INTRAMUSCULAR | Status: AC
  Administered 2016-05-25 (×2): 80 mg via INTRAVENOUS
  Filled 2016-05-25 (×2): qty 8

## 2016-05-25 MED ORDER — DEXTROSE 5 % IV SOLN
INTRAVENOUS | Status: AC
  Administered 2016-05-25: 16:00:00 via INTRAVENOUS

## 2016-05-25 MED ORDER — SODIUM CHLORIDE 0.9 % IV SOLN
Freq: Once | INTRAVENOUS | Status: AC
  Administered 2016-05-25: 10:00:00 via INTRAVENOUS

## 2016-05-25 MED ORDER — DEXTROSE 50 % IV SOLN
50.0000 mL | Freq: Once | INTRAVENOUS | Status: AC
  Administered 2016-05-25: 50 mL via INTRAVENOUS

## 2016-05-25 NOTE — Progress Notes (Signed)
SLP Cancellation Note  Patient Details Name: Teressa Baes MRN: [id number] DOB: 1941/03/07   Cancelled treatment:       Reason Eval/Treat Not Completed: Patient not medically ready. RN suggested waiting until after EGD for GI bleed. Will continue to follow for readiness.   Kern Reap, MA, CCC-SLP 05/25/2016, 4:13 PM

## 2016-05-25 NOTE — Clinical Social Work Note (Signed)
CSW spoke with pt spouse via phone regarding discharge planning. Pt husband indicated that the pt has used up number of days insurance will allot  for SNF rehab. Pt husband agreeable to [facility], and PT at home. CSW referred pt to case management for assistance with arranging home health at discharge.

## 2016-05-25 NOTE — Progress Notes (Signed)
[facility] Progress Note  Patient: Cassandra Bonilla [id number]   PCP: Marco Collie, MD DOB: [birth year]   DOA: 05/24/2016   DOS: 05/25/2016   Date of Service: the patient was seen and examined on 05/25/2016  Subjective: The patient remains confused and lethargic. Sleeps during conversation. Denies any acute complaint. No significant bleeding here in the hospital. Nutrition: Remains nothing by mouth  Brief hospital course: Pt. with PMH of A. fib on anticoagulation, type II DM, CKD, liver cirrhosis cryptogenic; admitted on 05/24/2016, with complaint of confusion and fatigue, was found to have hepatic encephalopathy as well as GI bleed. Also has significant uremia Currently further plan is continue supportive management and further workup for encephalopathy.  Assessment and Plan: 1. GIB (gastrointestinal bleeding) INR remains elevated. We will give her 5 mg of vitamin K once more time as well as to FFP. Patient scheduled for EGD sometime today or tomorrow. H&H remains relatively stable. Continue PPI, continue octreotide. Appreciate GI input.  2. Liver cirrhosis. Hepatic encephalopathy. Appreciate GI input. Patient remains nothing by mouth at present. Continue use of lactulose enema. May require NG tube for further aggressive enema dosing although ammonia level is 41 as compared to 101 yesterday. Continue IV ceftriaxone for SBP prophylaxis.  3. Acute encephalopathy. Etiology is currently unclear since the patient has significant improvement in her ammonia level but no improvement in her mental status. This can also be secondary to uremia as well as possible intracranial abnormality. We will get MRI of her brain. Appreciate nephrology input and monitoring.  4. Poor IV access. Appreciate critical care assistance and central line. We'll continue to monitor.  5. Hypokalemia. We will continue to monitor.  6. Elevated troponin. This is likely in the setting of chronic kidney  disease. We'll continue to monitor. Follow echocardiogram. Anticoagulation as well as antiplatelet as are contraindicated with ongoing GI bleed.  7. Hypoglycemia. Likely from poor oral intake. Patient on long-acting insulin at home and currently has had a due to worsening of her renal function. At present for short-term we will start the patient on D5 infusion.  8. Acute on chronic diastolic dysfunction. Patient appears to be clinically volume overloaded. Appreciate nephrology input. We will initiate the patient on high-dose Lasix. Monitor renal function.  9. Nutritional intake. Patient will currently remain nothing by mouth. We'll get speech therapy consult. The patient remains nothing by mouth beyond 05/26/2016 patient will require nasogastric feeding tube.  Pain management: When necessary Tylenol, judicious use Activity: Consulted physical therapy Bowel regimen: last BM 05/24/2016 Diet: Nothing by mouth at present DVT Prophylaxis: mechanical compression device.  Advance goals of care discussion: Full code  Family Communication: family was present at bedside, at the time of interview. The pt provided permission to discuss medical plan with the family. Opportunity was given to ask question and all questions were answered satisfactorily.   Disposition:  Discharge to home. Expected discharge date: 05/29/2016  Consultants: Gastroenterology Procedures: FFP transfusion  Antibiotics: Anti-infectives    Start     Dose/Rate Route Frequency Ordered Stop   05/24/16 1800  rifaximin (XIFAXAN) tablet 400 mg     400 mg Oral 3 times daily 05/24/16 1710     05/24/16 0600  cefTRIAXone (ROCEPHIN) 1 g in dextrose 5 % 50 mL IVPB     1 g 100 mL/hr over 30 Minutes Intravenous Every 24 hours 05/24/16 0443          Intake/Output Summary (Last 24 hours) at 05/25/16 1524 Last data filed at 05/25/16 [id number]  Gross per 24 hour  Intake          1604.79 ml  Output              775 ml  Net            829.79 ml   Filed Weights   05/24/16 0329  Weight: 74.7 kg (164 lb 10.9 oz)    Objective: Physical Exam: Vitals:   05/25/16 0404 05/25/16 0708 05/25/16 1132 05/25/16 1430  BP: 127/63 122/64 114/68 129/76  Pulse: 93 (!) 57 87 92  Resp: 12 16 15 10   Temp: 97.5 F (36.4 C) 97.2 F (36.2 C) 97.8 F (36.6 C) 97.5 F (36.4 C)  TempSrc: Oral Oral Axillary Oral  SpO2: 98% 99% 97% 96%  Weight:      Height:        General: Drowsy but easily Awake and not Oriented to Time, Place and Person. Appear in moderate distress Eyes: PERRL, Conjunctiva normal ENT: Oral Mucosa clear moist. Neck: positive JVD, no Abnormal Mass Or lumps Cardiovascular: S1 and S2 Present, no Murmur, Respiratory: Bilateral Air entry equal and Decreased, Clear to Auscultation, no Crackles, no wheezes Abdomen: Bowel Sound present, Soft and no tenderness Skin: no redness, no Rash  Extremities: bilateral Pedal edema, no calf tenderness Neurologic: Grossly no focal neuro deficit. Bilaterally Equal motor strength Mild Asterixis present.  Data Reviewed: CBC:  Recent Labs Lab 05/24/16 1853 05/25/16 0214  WBC 9.5 8.5  HGB 9.5* 8.9*  HCT 30.0* 27.6*  MCV 83.1 82.4  PLT 188 [id number]   Basic Metabolic Panel:  Recent Labs Lab 05/24/16 1853 05/25/16 0214  NA 136 136  K 3.4* 3.1*  CL 96* 96*  CO2 25 26  GLUCOSE 103* 63*  BUN 129* 133*  CREATININE 3.50* 3.52*  CALCIUM 7.8* 7.7*  MG 2.0  --   PHOS 6.5*  --     Liver Function Tests:  Recent Labs Lab 05/24/16 1853 05/25/16 0214  AST 35 34  ALT 21 21  ALKPHOS 200* 193*  BILITOT 1.8* 1.6*  PROT 7.2 6.5  ALBUMIN 2.8* 2.5*   No results for input(s): LIPASE, AMYLASE in the last 168 hours.  Recent Labs Lab 05/24/16 1853  AMMONIA 41*   Coagulation Profile:  Recent Labs Lab 05/24/16 0711 05/25/16 0214  INR 2.78 2.26   Cardiac Enzymes:  Recent Labs Lab 05/24/16 0523 05/24/16 1853 05/24/16 2210 05/25/16 0214  TROPONINI 0.08* 0.11*  0.12* 0.12*   BNP (last 3 results) No results for input(s): PROBNP in the last 8760 hours.  CBG:  Recent Labs Lab 05/25/16 0820 05/25/16 1129 05/25/16 1229 05/25/16 1322 05/25/16 1402  GLUCAP 136* 74 71 66 148*    Studies: No results found.   Scheduled Meds: . sodium chloride   Intravenous Once  . cefTRIAXone (ROCEPHIN)  IV  1 g Intravenous Q24H  . furosemide  80 mg Intravenous Q8H  . insulin aspart  0-9 Units Subcutaneous TID WC  . lactulose  300 mL Rectal BID  . phytonadione (VITAMIN K) IV  5 mg Intravenous Once  . rifaximin  400 mg Oral TID   Continuous Infusions: . dextrose    . octreotide  (SANDOSTATIN)    IV infusion 25 mcg/hr (05/25/16 0843)  . pantoprozole (PROTONIX) infusion 8 mg/hr (05/25/16 0909)   PRN Meds: acetaminophen **OR** acetaminophen, ondansetron **OR** ondansetron (ZOFRAN) IV  Time spent: 30 minutes  Author: Berle Mull, MD [facility] Pager: [phone] 05/25/2016 3:25 PM  If 7PM-7AM, please  contact night-coverage at [email or web address], password [facility]

## 2016-05-25 NOTE — Progress Notes (Signed)
CROSS COVER [facility] Subjective: Patient was supposed to have an EGD today that was cancelled as her INR was still not below 2. She denies having nay further problems with GI bleeding. She denies having any nausea, vomiting or abdominal pain.   Objective: Vital signs in last 24 hours: Temp:  [97.2 F (36.2 C)-98.1 F (36.7 C)] 97.5 F (36.4 C) (09/09 1430) Pulse Rate:  [57-93] 92 (09/09 1430) Resp:  [10-22] 10 (09/09 1430) BP: (114-129)/(62-76) 129/76 (09/09 1430) SpO2:  [96 %-100 %] 96 % (09/09 1430)    Intake/Output from previous day: 09/08 0701 - 09/09 0700 In: 1632.5 [I.V.:962.5; Blood:670] Out: 1175 [Urine:1175] Intake/Output this shift: Total I/O In: 307.3 [I.V.:257.3; IV Piggyback:50] Out: -   General appearance: appears stated age, fatigued, no distress and moderately obese, some what confused but pleasant in her manner  Resp: clear to auscultation bilaterally Cardio: regular rate and rhythm, S1, S2 normal, no murmur, click, rub or gallop GI: soft, non-tender; bowel sounds normal; no masses,  no organomegaly  Lab Results:  Recent Labs  05/24/16 1853 05/25/16 0214  WBC 9.5 8.5  HGB 9.5* 8.9*  HCT 30.0* 27.6*  PLT 188 182   BMET  Recent Labs  05/24/16 1853 05/25/16 0214  NA 136 136  K 3.4* 3.1*  CL 96* 96*  CO2 25 26  GLUCOSE 103* 63*  BUN 129* 133*  CREATININE 3.50* 3.52*  CALCIUM 7.8* 7.7*   LFT  Recent Labs  05/25/16 0214  PROT 6.5  ALBUMIN 2.5*  AST 34  ALT 21  ALKPHOS 193*  BILITOT 1.6*   PT/INR  Recent Labs  05/24/16 0711 05/25/16 0214  LABPROT 29.9* 25.4*  INR 2.78 2.26   Studies/Results: Portable Chest 1 View  Result Date: 05/24/2016 CLINICAL DATA:  Altered mental status, bloody stool. Former smoker. History of CHF, hypertension, coronary artery disease and diabetes. EXAM: PORTABLE CHEST 1 VIEW COMPARISON:  05/23/2016, 04/28/2016 FINDINGS: Semi-erect AP portable view chest. Patient is slightly rotated. There are low lung  volumes. There is evidence of previous sternotomy with sternal wires and surgical clips. Surgical clips at the left upper mediastinum are also noted. There is continued enlargement of the cardiomediastinal silhouette with dense atherosclerosis of the aorta. There are small bilateral effusions. Slight interval increase in patchy perihilar opacities perforating no pneumothorax. A linear opacity at the left base which could represent pleural calcification is again noted. IMPRESSION: 1. Stable enlargement of the cardiomediastinal silhouette with mild central vascular congestion present. 2. Low lung volumes with development of tiny bilateral effusions. Slight interval increase in perihilar patchy airspace opacities. Electronically Signed   By: Donavan Foil M.D.   On: 05/24/2016 09:39   Medications: I have reviewed the patient's current medications.  Assessment/Plan: 1) Melena with anemia: will give patient more Vitamin K and FFP and plan an EGD tomorrow. 2) Cirrhosis of the liver-etiology unclear-patient appears to be encephalopathic; agree with treatment with Lactulose for now . Liver biopsy results from 05/01/16 indicated ?CHF vs reno-occlusive disease vs drugs.  3) Uremia may be confusing the picture; BUN/Creatinine 133/3.52.  4) Acute on chronic diastolic dysfunction.  5) History of atrial fibrillation-was on Coumadin prior to admission.  6) IDDM.  7) History of gout.   LOS: 1 day   Zayyan Mullen 05/25/2016, 3:32 PM

## 2016-05-25 NOTE — Progress Notes (Signed)
RN unable to get consent for EGD from patient or patients family about procedure that is supposed to be happening this morning due to MD not discussing EGD with patient or family yet.

## 2016-05-25 NOTE — Consult Note (Signed)
Renal Service Consult Note [facility]  Cassandra Bonilla 05/25/2016 Roney Jaffe D Requesting Physician:  Dr. Posey Pronto  Reason for Consult:  CKD pt with AMS and ^NH3/ BUN HPI: The patient is a 75 y.o. year-old with history of DM, systolic CHF, CKD stage 3/4, hx CABG/ MI, chron afib.  She was admitted in July w pruritis of unknown etiology, then again in August with pruritis and AMS and found to have [facility] of 80.  She was seen by GI, rx with lactulose and Rifaximin and dc'd to SNF to f/u w GI after liver bx was done.  Now returns with melena, anemia and AMS.  Recent INR ^ at 3.7 last week.  Nauseous, not eating well and had LOC episode.  Sent to [facility] ED and given FFP/ vit K there and 1 unit blood, then sent to [facility] for admission.  NH3 on admit was 107, rec'd lactulose enema.  Na 136  K 3.1  BUN 133  Creat 3.53, Ca 7.7 Alb 2.5  Tbili 1.6  AST/ALT wnl.  Asked to see for renal failure.   Patient gives some history but is disoriented.  Grew up in [place], [place], her father was a Therapist, nutritional and travelled all over the country.  Her mother was a Pharmacist, hospital, no siblings.  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  Married in [place], had six children, lives with one of them and her husband now in [place].  No sig etoh/ tobacco.  Has a walker at home but doesn't use very often.   Denies any active N/V/D, abd pain, SOB or CP, no fevers, sweats or chills.         ROS  denies CP  no joint pain   no HA  no blurry vision  no rash  no diarrhea  no nausea/ vomiting  no dysuria  no difficulty voiding  no change in urine color    Past Medical History  Past Medical History:  Diagnosis Date  . CHF (congestive heart failure) ([place])   . CKD (chronic kidney disease) stage 3, GFR 30-59 ml/min   . COPD (chronic obstructive pulmonary disease) ([place])   . Coronary artery disease   . Diabetes mellitus without complication ([place])   . Gout   . Hypertension   . Hypokalemia 03/21/2016   Past  Surgical History  Past Surgical History:  Procedure Laterality Date  . CAROTID ENDARTERECTOMY    . CESAREAN SECTION    . CHOLECYSTECTOMY    . CORONARY ANGIOPLASTY    . CORONARY ARTERY BYPASS GRAFT     Family History  Family History  Problem Relation Age of Onset  . Breast cancer Mother    Social History  reports that she has quit smoking. She has never used smokeless tobacco. She reports that she does not drink alcohol or use drugs. Allergies  Allergies  Allergen Reactions  . Contrast Media [Iodinated Diagnostic Agents] Hives  . Prednisone Other (See Comments)    Confusion  . Lactose Intolerance (Gi) Diarrhea    There are times when/if the patient consumes milk products, it results in diarrhea   Home medications Prior to Admission medications   Medication Sig Start Date End Date Taking? Authorizing Provider  acetaminophen (TYLENOL) 650 MG CR tablet Take 975 mg by mouth at bedtime as needed for pain.    Yes Historical Provider, MD  allopurinol (ZYLOPRIM) 100 MG tablet Take 100 mg by mouth daily.   Yes Historical Provider, MD  amLODipine (NORVASC) 10 MG tablet Take 10 mg by mouth daily.   Yes Historical Provider, MD  aspirin EC 81 MG tablet Take 81 mg by mouth daily.   Yes Historical Provider, MD  atorvastatin (LIPITOR) 40 MG tablet Take 40 mg by mouth daily.   Yes Historical Provider, MD  furosemide (LASIX) 40 MG tablet Take 3 tablets (120 mg total) by mouth 2 (two) times daily. 05/03/16  Yes Kelvin Cellar, MD  guaiFENesin-codeine [facility]) 100-10 MG/5ML syrup Take 10 mLs by mouth 3 (three) times daily as needed for cough.   Yes Historical Provider, MD  hydrOXYzine (ATARAX/VISTARIL) 25 MG tablet Take 1-2 tablets (25-50 mg total) by mouth every 6 (six) hours as needed for itching. Patient taking differently: Take 25-50 mg by mouth See admin instructions. Take 2 tablets (50 mg) by mouth daily at bedtime, may also take 1 tablet (25 mg) 3 times during the day as needed for  itching 03/24/16  Yes Hosie Poisson, MD  insulin glargine (LANTUS) 100 UNIT/ML injection Inject 10 Units into the skin every morning.    Yes Historical Provider, MD  insulin lispro (HUMALOG) 100 UNIT/ML injection Inject 2-15 Units into the skin 3 (three) times daily after meals. 2-8 units if CBG <400, takes 15 units if CBG >400 (per sliding scale)   Yes Historical Provider, MD  lactulose (CHRONULAC) 10 GM/15ML solution Take 30 mLs (20 g total) by mouth 2 (two) times daily. Patient taking differently: Take 20 g by mouth 2 (two) times daily as needed for mild constipation.  05/03/16  Yes Kelvin Cellar, MD  metolazone (ZAROXOLYN) 5 MG tablet Take 5 mg by mouth daily.    Yes Historical Provider, MD  metoprolol succinate (TOPROL-XL) 50 MG 24 hr tablet Take 1 tablet (50 mg total) by mouth daily. Take with or immediately following a meal. 05/03/16  Yes Kelvin Cellar, MD  pantoprazole (PROTONIX) 40 MG tablet Take 40 mg by mouth daily.    Yes Historical Provider, MD  potassium chloride SA (K-DUR,KLOR-CON) 20 MEQ tablet Take 2 tablets (40 mEq total) by mouth daily. 05/03/16  Yes Kelvin Cellar, MD  rifaximin (XIFAXAN) 200 MG tablet Take 2 tablets (400 mg total) by mouth 3 (three) times daily. Patient taking differently: Take 400 mg by mouth 2 (two) times daily.  05/03/16  Yes Kelvin Cellar, MD  warfarin (COUMADIN) 2 MG tablet Take 3 mg by mouth every evening.   Yes Historical Provider, MD   Liver Function Tests  Recent Labs Lab 05/24/16 1853 05/25/16 0214  AST 35 34  ALT 21 21  ALKPHOS 200* 193*  BILITOT 1.8* 1.6*  PROT 7.2 6.5  ALBUMIN 2.8* 2.5*   No results for input(s): LIPASE, AMYLASE in the last 168 hours. CBC  Recent Labs Lab 05/24/16 1853 05/25/16 0214  WBC 9.5 8.5  HGB 9.5* 8.9*  HCT 30.0* 27.6*  MCV 83.1 82.4  PLT 188 542   Basic Metabolic Panel  Recent Labs Lab 05/24/16 1853 05/25/16 0214  NA 136 136  K 3.4* 3.1*  CL 96* 96*  CO2 25 26  GLUCOSE 103* 63*  BUN 129*  133*  CREATININE 3.50* 3.52*  CALCIUM 7.8* 7.7*  PHOS 6.5*  --    Iron/TIBC/Ferritin/ %Sat    Component Value Date/Time   IRON 36 03/22/2016 0855   TIBC 440 03/22/2016 0855   IRONPCTSAT 8 (L) 03/22/2016 0855    Vitals:   05/24/16 2356 05/25/16 0404 05/25/16 0708 05/25/16 1132  BP: 125/73 127/63 122/64 114/68  Pulse: 89 93 (!) 57 87  Resp: (!) 22 12 16 15   Temp: 97.7 F (36.5 C) 97.5 F (36.4 C) 97.2 F (36.2 C) 97.8 F (36.6 C)  TempSrc: Oral Oral Oral Axillary  SpO2: 100% 98% 99% 97%  Weight:      Height:       Exam HR 88  R 17   BP 122/64   RA 97%  afib  T 97.8 Gen confused but responsive and pleasant No rash, cyanosis or gangrene Sclera anicteric, throat clear  +JVD to angle of jaw, no bruits Chest clear bilat no rales or wheezing RRR w soft SEM no RG Abd soft ntnd no mass or ascites +bs, no HSM GU defer MS no joint effusions or deformity, well-healed scar L medial leg from prior bypass Ext 1+ bilat hip edema / no wounds or ulcers Neuro is alert, Ox 1, nonfocal, mild asterixis  Na 136  K 3.1  CO2 26  BUN 133  Creat 3.52   Ca 7.7   eGFR 14   Alb 2.5  Tbili 1.6   AST 34  ALT 21   Trop 0.12   WBC 8k  Hb 8.9  plt 182    INR 2.2   Hb A1c 7.4   UA 8/8 >>>  0-5 rbc/ wbc, neg otherwise Renal US 04/24/16 >> 10.5/ 10.6 cm kidneys, ^'d echo, no hydro, R pleural effusion and ascites Home meds >> allopurinol, norvasc, lipitor, lasix, Lantus, Humalog, lactulose, Zaroxolyn, Toprol-XL, PPI, KCl, Xifaxan  Assessment: 1.  CKD stage IV/ V - creat is no different than baseline.  BUN up from UGIB.  Would not recommend HD at this time.  Treat hepatic encephalopathy and MS not better in 3-4 days can consider trial of HD.  2.  Liver disease - ^INR/ NH3, unclear cause, GI evaluating, getting lactulose enemas and Xifaxan 3.  GI bleed - per primary / GI 4.  Vol excess - +jvd/ leg edema, not severe 5.  HTN - not taking po yet, getting sched IV MTP 2.5 mg tid w good bp control 6.  Gout on  allopruinol 7.  CAD hx CABG/ ICM EF 30-40%   Plan - give IV lasix 1-2 doses, UA, UNa, will follow  Kelly Splinter MD [place] pager [phone]    cell [phone] 05/25/2016, 1:42 PM

## 2016-05-25 NOTE — Procedures (Signed)
Central Venous Catheter Insertion Procedure Note Cassandra Bonilla [id number] 1941/06/01  Procedure: Insertion of Central Venous Catheter Indications: Assessment of intravascular volume, Drug and/or fluid administration and Frequent blood sampling  Procedure Details Consent: Risks of procedure as well as the alternatives and risks of each were explained to the (patient/caregiver).  Consent for procedure obtained. Time Out: Verified patient identification, verified procedure, site/side was marked, verified correct patient position, special equipment/implants available, medications/allergies/relevent history reviewed, required imaging and test results available.  Performed  Maximum sterile technique was used including antiseptics, cap, gloves, gown, hand hygiene, mask and sheet. Skin prep: Chlorhexidine; local anesthetic administered A antimicrobial bonded/coated triple lumen catheter was placed in the right internal jugular vein using the Seldinger technique. Ultrasound guidance used.Yes.   Catheter placed to 17 cm. Blood aspirated via all 3 ports and then flushed x 3. Line sutured x 2 and dressing applied.  Evaluation Blood flow good Complications: No apparent complications Patient did tolerate procedure well. Chest X-ray ordered to verify placement.  CXR: pending.  Richardson Landry Anahid Eskelson ACNP [facility] Pager [phone] till 3 pm If no answer page [phone] 05/25/2016, 3:19 PM

## 2016-05-26 ENCOUNTER — Inpatient Hospital Stay (HOSPITAL_COMMUNITY): Payer: Medicare Other

## 2016-05-26 ENCOUNTER — Encounter (HOSPITAL_COMMUNITY)
Admission: AD | Disposition: A | Discharge status: Home Health | Payer: Self-pay | Source: Other Acute Inpatient Hospital | Attending: Internal Medicine

## 2016-05-26 ENCOUNTER — Encounter (HOSPITAL_COMMUNITY): Payer: Self-pay

## 2016-05-26 DIAGNOSIS — R079 Chest pain, unspecified: Secondary | ICD-10-CM

## 2016-05-26 HISTORY — PX: ESOPHAGOGASTRODUODENOSCOPY: SHX5428

## 2016-05-26 LAB — COMPREHENSIVE METABOLIC PANEL
ALBUMIN: 2.5 g/dL — AB (ref 3.5–5.0)
ALK PHOS: 169 U/L — AB (ref 38–126)
ALT: 30 U/L (ref 14–54)
AST: 56 U/L — AB (ref 15–41)
Anion gap: 14 (ref 5–15)
BILIRUBIN TOTAL: 1.2 mg/dL (ref 0.3–1.2)
BUN: 125 mg/dL — AB (ref 6–20)
CALCIUM: 7.5 mg/dL — AB (ref 8.9–10.3)
CO2: 27 mmol/L (ref 22–32)
Chloride: 95 mmol/L — ABNORMAL LOW (ref 101–111)
Creatinine, Ser: 3.48 mg/dL — ABNORMAL HIGH (ref 0.44–1.00)
GFR calc Af Amer: 14 mL/min — ABNORMAL LOW (ref >60)
GFR calc non Af Amer: 12 mL/min — ABNORMAL LOW (ref >60)
GLUCOSE: 136 mg/dL — AB (ref 65–99)
Potassium: 2.8 mmol/L — ABNORMAL LOW (ref 3.5–5.1)
SODIUM: 136 mmol/L (ref 135–145)
TOTAL PROTEIN: 6.3 g/dL — AB (ref 6.5–8.1)

## 2016-05-26 LAB — PREPARE FRESH FROZEN PLASMA
UNIT DIVISION: 0
UNIT DIVISION: 0

## 2016-05-26 LAB — CBC WITH DIFFERENTIAL/PLATELET
BASOS ABS: 0 10*3/uL (ref 0.0–0.1)
BASOS PCT: 0 %
EOS ABS: 0.1 10*3/uL (ref 0.0–0.7)
Eosinophils Relative: 1 %
HEMATOCRIT: 25.8 % — AB (ref 36.0–46.0)
HEMOGLOBIN: 8.2 g/dL — AB (ref 12.0–15.0)
Lymphocytes Relative: 12 %
Lymphs Abs: 0.9 10*3/uL (ref 0.7–4.0)
MCH: 26.7 pg (ref 26.0–34.0)
MCHC: 31.8 g/dL (ref 30.0–36.0)
MCV: 84 fL (ref 78.0–100.0)
MONOS PCT: 10 %
Monocytes Absolute: 0.8 10*3/uL (ref 0.1–1.0)
NEUTROS ABS: 5.9 10*3/uL (ref 1.7–7.7)
NEUTROS PCT: 77 %
Platelets: 169 10*3/uL (ref 150–400)
RBC: 3.07 MIL/uL — AB (ref 3.87–5.11)
RDW: 17.8 % — ABNORMAL HIGH (ref 11.5–15.5)
WBC: 7.8 10*3/uL (ref 4.0–10.5)

## 2016-05-26 LAB — PROTIME-INR
INR: 1.53
Prothrombin Time: 18.6 seconds — ABNORMAL HIGH (ref 11.4–15.2)

## 2016-05-26 LAB — AMMONIA: AMMONIA: 41 umol/L — AB (ref 9–35)

## 2016-05-26 LAB — GLUCOSE, CAPILLARY
GLUCOSE-CAPILLARY: 141 mg/dL — AB (ref 65–99)
GLUCOSE-CAPILLARY: 264 mg/dL — AB (ref 65–99)
Glucose-Capillary: 133 mg/dL — ABNORMAL HIGH (ref 65–99)
Glucose-Capillary: 160 mg/dL — ABNORMAL HIGH (ref 65–99)

## 2016-05-26 LAB — BASIC METABOLIC PANEL
Anion gap: 15 (ref 5–15)
BUN: 117 mg/dL — AB (ref 6–20)
CHLORIDE: 93 mmol/L — AB (ref 101–111)
CO2: 24 mmol/L (ref 22–32)
Calcium: 7.6 mg/dL — ABNORMAL LOW (ref 8.9–10.3)
Creatinine, Ser: 3.55 mg/dL — ABNORMAL HIGH (ref 0.44–1.00)
GFR calc Af Amer: 13 mL/min — ABNORMAL LOW (ref >60)
GFR calc non Af Amer: 12 mL/min — ABNORMAL LOW (ref >60)
Glucose, Bld: 290 mg/dL — ABNORMAL HIGH (ref 65–99)
POTASSIUM: 3.6 mmol/L (ref 3.5–5.1)
SODIUM: 132 mmol/L — AB (ref 135–145)

## 2016-05-26 LAB — ECHOCARDIOGRAM COMPLETE
HEIGHTINCHES: 62 in
WEIGHTICAEL: 2634.94 [oz_av]

## 2016-05-26 LAB — MAGNESIUM: Magnesium: 1.8 mg/dL (ref 1.7–2.4)

## 2016-05-26 LAB — PHOSPHORUS: PHOSPHORUS: 5.9 mg/dL — AB (ref 2.5–4.6)

## 2016-05-26 LAB — UREA NITROGEN, URINE: UREA NITROGEN UR: 350 mg/dL

## 2016-05-26 SURGERY — EGD (ESOPHAGOGASTRODUODENOSCOPY)
Anesthesia: Moderate Sedation

## 2016-05-26 MED ORDER — FENTANYL CITRATE (PF) 100 MCG/2ML IJ SOLN
INTRAMUSCULAR | Status: AC
  Filled 2016-05-26: qty 2

## 2016-05-26 MED ORDER — MIDAZOLAM HCL 5 MG/ML IJ SOLN
INTRAMUSCULAR | Status: AC
  Filled 2016-05-26: qty 1

## 2016-05-26 MED ORDER — FENTANYL CITRATE (PF) 100 MCG/2ML IJ SOLN
INTRAMUSCULAR | Status: DC | PRN
  Administered 2016-05-26 (×2): 25 ug via INTRAVENOUS

## 2016-05-26 MED ORDER — LACTULOSE 10 GM/15ML PO SOLN
20.0000 g | Freq: Two times a day (BID) | ORAL | Status: DC
  Administered 2016-05-26 – 2016-05-29 (×5): 20 g via ORAL
  Filled 2016-05-26 (×6): qty 30

## 2016-05-26 MED ORDER — POTASSIUM CHLORIDE 10 MEQ/50ML IV SOLN
10.0000 meq | INTRAVENOUS | Status: AC
  Administered 2016-05-26 (×4): 10 meq via INTRAVENOUS
  Filled 2016-05-26 (×6): qty 50

## 2016-05-26 MED ORDER — DARBEPOETIN ALFA 60 MCG/0.3ML IJ SOSY
60.0000 ug | PREFILLED_SYRINGE | INTRAMUSCULAR | Status: DC
  Administered 2016-05-26 – 2016-06-02 (×2): 60 ug via SUBCUTANEOUS
  Filled 2016-05-26 (×2): qty 0.3

## 2016-05-26 MED ORDER — MIDAZOLAM HCL 5 MG/5ML IJ SOLN
INTRAMUSCULAR | Status: DC | PRN
  Administered 2016-05-26 (×2): 1 mg via INTRAVENOUS
  Administered 2016-05-26: 2 mg via INTRAVENOUS

## 2016-05-26 MED ORDER — SODIUM CHLORIDE 0.9 % IV SOLN: INTRAVENOUS | Status: DC

## 2016-05-26 MED ORDER — FUROSEMIDE 10 MG/ML IJ SOLN
120.0000 mg | Freq: Two times a day (BID) | INTRAVENOUS | Status: DC
  Administered 2016-05-26 – 2016-05-27 (×3): 120 mg via INTRAVENOUS
  Filled 2016-05-26 (×4): qty 12

## 2016-05-26 NOTE — Progress Notes (Signed)
[facility] Progress Note   Subjective: feels ok, no SOB or cough, 2 L UOP yest w IV lasix. 96%RA,  B/Cr 125 and 3.48, no chg.  UA neg, K low 2.8, UNa 69.  9/9 CXR +CHF.  BP 130 /80. Hungry , wants to eat  Vitals:   05/25/16 2125 05/25/16 2245 05/26/16 0445 05/26/16 0700  BP: (!) 135/56 140/70 (!) 118/52   Pulse: 86 (!) 110 84   Resp: 10 15 13    Temp: 98 F (36.7 C) 98.2 F (36.8 C) 98.3 F (36.8 C) 98 F (36.7 C)  TempSrc: Oral Oral Oral Oral  SpO2: 97% 96% 94%   Weight:      Height:        Inpatient medications: . cefTRIAXone (ROCEPHIN)  IV  1 g Intravenous Q24H  . insulin aspart  0-9 Units Subcutaneous TID WC  . lactulose  300 mL Rectal BID  . potassium chloride  10 mEq Intravenous Q1 Hr x 5  . rifaximin  400 mg Oral TID   . octreotide  (SANDOSTATIN)    IV infusion 25 mcg/hr (05/26/16 0600)  . pantoprozole (PROTONIX) infusion 8 mg/hr (05/26/16 0600)   acetaminophen **OR** acetaminophen, methocarbamol (ROBAXIN)  IV, ondansetron **OR** ondansetron (ZOFRAN) IV  Exam: Gen awake and alert, "hospital", doesn't know place or year, answers some questions accurately Sclera anicteric, throat clear  +JVD to angle of jaw, no bruits Chest clear bilat no rales or wheezing RRR w soft SEM no RG Abd soft ntnd no mass or ascites +bs, no HSM MS well-healed scar L medial leg from prior bypass Ext 1-2+ LE/UE edema bilat Neuro is alert, Ox 1, asterixis better   UA 8/8 >>>  0-5 rbc/ wbc, neg otherwise Renal US 04/24/16 >> 10.5/ 10.6 cm kidneys, ^'d echo, no hydro, R pleural effusion and ascites Home meds >> allopurinol, norvasc, lipitor, lasix, Lantus, Humalog, lactulose, Zaroxolyn, Toprol-XL, PPI, KCl, Xifaxan  Assessment: 1.  CKD stage IV/ V - f/b Dr Hollie Salk w [facility].  Creat is no different than baseline.  BUN up from UGIB. Cont to treat for hepatic encephalopathy, seems to be improving some. Vol excess, will cont IV diuretics.  Cont to hold off on HD for now.  2.  Liver  disease - ^INR/ NH3, unclear cause, GI evaluating, getting lactulose enemas and Xifaxan 3.  GI bleed - per primary / GI 4.  Vol excess - +jvd/ leg edema, +edema last CXR 5.  HTN - not taking po yet, getting sched IV MTP 2.5 mg tid w good bp control 6.  Gout on allopruinol 7.  CAD hx CABG/ ICM EF 30-40% 8.  Anemia of CKD/ GIB - Hb low 8's, will start darbe 60/wk and get Fe/TIBC 9.  Hypokalemia - replace   Plan - as above   Kelly Splinter MD [facility] pager [phone]    cell [phone] 05/26/2016, 9:56 AM    Recent Labs Lab 05/24/16 1853 05/25/16 0214 05/25/16 1659 05/26/16 0320  NA 136 136 135 136  K 3.4* 3.1* 2.8* 2.8*  CL 96* 96* 96* 95*  CO2 25 26 25 27   GLUCOSE 103* 63* 127* 136*  BUN 129* 133* 124* 125*  CREATININE 3.50* 3.52* 3.55* 3.48*  CALCIUM 7.8* 7.7* 7.6* 7.5*  PHOS 6.5*  --  5.9* 5.9*    Recent Labs Lab 05/24/16 1853 05/25/16 0214 05/25/16 1659 05/26/16 0320  AST 35 34  --  56*  ALT 21 21  --  30  ALKPHOS 200* 193*  --  169*  BILITOT 1.8* 1.6*  --  1.2  PROT 7.2 6.5  --  6.3*  ALBUMIN 2.8* 2.5* 2.7* 2.5*    Recent Labs Lab 05/25/16 0214 05/25/16 1700 05/26/16 0320  WBC 8.5 7.6 7.8  NEUTROABS  --  5.9 5.9  HGB 8.9* 8.5* 8.2*  HCT 27.6* 26.6* 25.8*  MCV 82.4 82.9 84.0  PLT 182 191 169   Iron/TIBC/Ferritin/ %Sat    Component Value Date/Time   IRON 36 03/22/2016 0855   TIBC 440 03/22/2016 0855   IRONPCTSAT 8 (L) 03/22/2016 [id number]

## 2016-05-26 NOTE — Op Note (Signed)
[facility] Patient Name: Cassandra Bonilla Procedure Date : 05/26/2016 MRN: [id number] Attending MD: Juanita Craver , MD Date of Birth: [birth year] CSN: [id number] Age: 75 Admit Type: Inpatient Procedure:                Diagnostic EGD. Indications:              Iron deficiency anemia secondary to chronic blood                            loss, Hematochezia Providers:                Juanita Craver, MD, Carolynn Comment, RN, William Dalton, Technician. Referring MD:             Gatha Mayer, MD Medicines:                Fentanyl 50 micrograms & Midazolam 4 mg IV. Complications:            No immediate complications. Estimated Blood Loss:     Estimated blood loss: none. Procedure:                Pre-anesthesia assessment: - Prior to the                            procedure, a history and physical was performed,                            and patient medications and allergies were                            reviewed. The patient's tolerance of previous                            anesthesia was also reviewed. The risks and                            benefits of the procedure and the sedation options                            and risks were discussed with the patient. All                            questions were answered, and informed consent was                            obtained. Prior anticoagulants: The patient has                            taken Coumadin (Warfarin), last dose was 3 days                            prior to procedure. ASA Grade assessment: III - A  patient with severe systemic disease. After                            reviewing the risks and benefits, the patient was                            deemed in satisfactory condition to undergo the                            procedure. After obtaining informed consent, the                            endoscope was passed under direct vision.   Throughout the procedure, the patient's blood                            pressure, pulse, and oxygen saturations were                            monitored continuously. The [id number])                            scope was introduced through the mouth, and                            advanced to the second part of duodenum. The EGD                            was accomplished without difficulty. The patient                            tolerated the procedure well. Scope In: Scope Out: Findings:      The examined esophagus and GEJ were widely patent and appeared normal.      A small hiatal hernia was noted on retroflexion; otherwise normal       appearing stomach.      The examined duodenum was normal. Impression:               - Normal appearing, widely patent esophagus and GEJ.                           - Small hiatal hernia; otherwise normal appearing                            stomach; .                           - Normal examined duodenum.                           - No specimens collected. Moderate Sedation:      Moderate (conscious) sedation was administered by the endoscopy nurse       and supervised by the endoscopist. The following parameters were       monitored: oxygen saturation, heart rate, blood pressure, respiratory       rate, EKG,  adequacy of pulmonary ventilation, and response to care.       Total physician intraservice time was 10 minutes. Recommendation:           - Full liquid diet today.                           - Continue present medications except for                            Octerotide.                           - Patient was not able to tolerate the prep for a                            colonaocopy in May 2017. This will have to be                            discussed with her. Procedure Code(s):        --- Professional ---                           [id number], Esophagogastroduodenoscopy, flexible,                            transoral; diagnostic, including  collection of                            specimen(s) by brushing or washing, when performed                            (separate procedure) Diagnosis Code(s):        --- Professional ---                           K92.1, Melena (includes Hematochezia)                           D50.0, Iron deficiency anemia secondary to blood                            loss (chronic)                           K44.9, Diaphragmatic hernia without obstruction or                            gangrene CPT copyright 2016 American [employer] Association. All rights reserved. The codes documented in this report are preliminary and upon coder review may  be revised to meet current compliance requirements. Juanita Craver, MD Juanita Craver, MD 05/26/2016 1:16:52 PM This report has been signed electronically. Number of Addenda: 0

## 2016-05-26 NOTE — Progress Notes (Signed)
PT Cancellation Note  Patient Details Name: Cassandra Bonilla MRN: [id number] DOB: 1941/06/25   Cancelled Treatment:    Reason Eval/Treat Not Completed: Patient at procedure or test/unavailable   Will follow up later today as time allows;  Otherwise, will follow up for PT tomorrow;   Thank you,  Roney Marion, [place] Pager [phone] Office [phone]     Roney Marion [facility] 05/26/2016, 12:58 PM

## 2016-05-26 NOTE — Progress Notes (Signed)
[facility] Progress Note  Patient: Cassandra Bonilla [id number]   PCP: Marco Collie, MD DOB: 05/28/1941   DOA: 05/24/2016   DOS: 05/26/2016   Date of Service: the patient was seen and examined on 05/26/2016  Subjective: Patient shows significant improvement in her mentation this morning. Denies any acute complaint. States she is feeling better. No nausea no vomiting. Was somewhat lethargic initially after the EGD. Nutrition: Tolerating liquids  Brief hospital course: Pt. with PMH of A. fib on anticoagulation, type II DM, CKD, liver cirrhosis cryptogenic; admitted on 05/24/2016, with complaint of confusion and fatigue, was found to have hepatic encephalopathy as well as GI bleed. Also has significant uremia Currently further plan is continue supportive management and further workup for encephalopathy.  Assessment and Plan: 1. GIB (gastrointestinal bleeding) INR elevated because of anticoagulation with warfarin, Reversed with 5 mg of IV vitamin K as well as FFP 2. EGD performed by Dr. Collene Mares suggest no acute bleeding. Colonoscopy was not performed due to inability to tolerate the prep in the past. H&H remains relatively stable. Continue PPI, continue octreotide. Appreciate GI input.  2. Liver cirrhosis. Hepatic encephalopathy. Appreciate GI input. He initially was given lactulose enema, currently we will transition to oral lactulose. Continue IV ceftriaxone for SBP prophylaxis.  3. Acute encephalopathy. Etiology is currently unclear since the patient has significant improvement in her ammonia level but no improvement in her mental status. Negative MRI brain for any acute stroke. Ammonia level improving. Clinically asterixis resolving.  Appreciate nephrology input and monitoring.  4. Poor IV access. Appreciate critical care assistance and central line. We'll continue to monitor.  5. Hypokalemia. We will continue to monitor. Recheck BMP.  6. Elevated troponin. This is likely in  the setting of chronic kidney disease. Echocardiogram shows improvement in ejection fraction from 35% to 45%. No significant wall motion abnormality and continues to have signs suggestive of possible restrictive disease as well as severe TR with right-sided heart failure.  Anticoagulation as well as antiplatelet as are contraindicated with ongoing GI bleed.  7. Hypoglycemia. Likely from poor oral intake. Patient on long-acting insulin at home and currently has had a due to worsening of her renal function. If patient will be eating her diet we will stop the D5 infusion.  8. Acute on chronic diastolic dysfunction. Patient appears to be clinically volume overloaded. Appreciate nephrology input.  patient on high-dose Lasix. Monitor renal function.  9. Nutritional intake. Sensation shows meaningful improvement in her mentation to protect her airway. Resume full liquid diet.  Pain management: When necessary Tylenol, judicious use Activity: Consulted physical therapy Bowel regimen: last BM 05/26/2016 Diet: Full liquid diet DVT Prophylaxis: mechanical compression device.  Advance goals of care discussion: Full code  Family Communication: family was present at bedside, at the time of interview. The pt provided permission to discuss medical plan with the family. Opportunity was given to ask question and all questions were answered satisfactorily.   Disposition:  Discharge to home. Expected discharge date: 05/29/2016  Consultants: Gastroenterology Procedures: FFP transfusion, EGD  Antibiotics: Anti-infectives    Start     Dose/Rate Route Frequency Ordered Stop   05/24/16 1800  rifaximin (XIFAXAN) tablet 400 mg     400 mg Oral 3 times daily 05/24/16 1710     05/24/16 0600  cefTRIAXone (ROCEPHIN) 1 g in dextrose 5 % 50 mL IVPB     1 g 100 mL/hr over 30 Minutes Intravenous Every 24 hours 05/24/16 0443  Intake/Output Summary (Last 24 hours) at 05/26/16 1850 Last data filed  at 05/26/16 1500  Gross per 24 hour  Intake          1446.79 ml  Output             2050 ml  Net          -603.21 ml   Filed Weights   05/24/16 0329  Weight: 74.7 kg (164 lb 10.9 oz)    Objective: Physical Exam: Vitals:   05/26/16 1320 05/26/16 1330 05/26/16 1348 05/26/16 1500  BP: (!) 118/40 (!) 126/49 (!) 116/57 122/60  Pulse: 78 90 73 64  Resp: 12 12 11 12   Temp:   98 F (36.7 C) 97 F (36.1 C)  TempSrc:   Oral Oral  SpO2: 96% 92% 98% 98%  Weight:      Height:        General: Drowsy but easily Awake and not Oriented to Time, Place and Person. Appear in moderate distress Eyes: PERRL, Conjunctiva normal ENT: Oral Mucosa clear moist. Neck: positive JVD, no Abnormal Mass Or lumps Cardiovascular: S1 and S2 Present, no Murmur, Respiratory: Bilateral Air entry equal and Decreased, Clear to Auscultation, no Crackles, no wheezes Abdomen: Bowel Sound present, Soft and no tenderness Skin: no redness, no Rash  Extremities: bilateral Pedal edema, no calf tenderness Neurologic: Grossly no focal neuro deficit. Bilaterally Equal motor strength No Asterixis present.  Data Reviewed: CBC:  Recent Labs Lab 05/24/16 1853 05/25/16 0214 05/25/16 1700 05/26/16 0320  WBC 9.5 8.5 7.6 7.8  NEUTROABS  --   --  5.9 5.9  HGB 9.5* 8.9* 8.5* 8.2*  HCT 30.0* 27.6* 26.6* 25.8*  MCV 83.1 82.4 82.9 84.0  PLT 188 182 191 [id number]   Basic Metabolic Panel:  Recent Labs Lab 05/24/16 1853 05/25/16 0214 05/25/16 1659 05/26/16 0320  NA 136 136 135 136  K 3.4* 3.1* 2.8* 2.8*  CL 96* 96* 96* 95*  CO2 25 26 25 27   GLUCOSE 103* 63* 127* 136*  BUN 129* 133* 124* 125*  CREATININE 3.50* 3.52* 3.55* 3.48*  CALCIUM 7.8* 7.7* 7.6* 7.5*  MG 2.0  --   --  1.8  PHOS 6.5*  --  5.9* 5.9*    Liver Function Tests:  Recent Labs Lab 05/24/16 1853 05/25/16 0214 05/25/16 1659 05/26/16 0320  AST 35 34  --  56*  ALT 21 21  --  30  ALKPHOS 200* 193*  --  169*  BILITOT 1.8* 1.6*  --  1.2  PROT 7.2  6.5  --  6.3*  ALBUMIN 2.8* 2.5* 2.7* 2.5*   No results for input(s): LIPASE, AMYLASE in the last 168 hours.  Recent Labs Lab 05/24/16 1853  AMMONIA 41*   Coagulation Profile:  Recent Labs Lab 05/24/16 0711 05/25/16 0214 05/26/16 0320  INR 2.78 2.26 1.53   Cardiac Enzymes:  Recent Labs Lab 05/24/16 0523 05/24/16 1853 05/24/16 2210 05/25/16 0214  TROPONINI 0.08* 0.11* 0.12* 0.12*   BNP (last 3 results) No results for input(s): PROBNP in the last 8760 hours.  CBG:  Recent Labs Lab 05/25/16 2222 05/25/16 2353 05/26/16 0445 05/26/16 0807 05/26/16 1516  GLUCAP 121* 129* 133* 141* 160*    Studies: No results found.   Scheduled Meds: . cefTRIAXone (ROCEPHIN)  IV  1 g Intravenous Q24H  . darbepoetin (ARANESP) injection - NON-DIALYSIS  60 mcg Subcutaneous Q Sun-1800  . furosemide  120 mg Intravenous Q12H  . insulin aspart  0-9 Units Subcutaneous  TID WC  . lactulose  300 mL Rectal BID  . rifaximin  400 mg Oral TID   Continuous Infusions: . sodium chloride    . pantoprozole (PROTONIX) infusion 8 mg/hr (05/26/16 0600)   PRN Meds: acetaminophen **OR** acetaminophen, methocarbamol (ROBAXIN)  IV, ondansetron **OR** ondansetron (ZOFRAN) IV  Time spent: 30 minutes  Author: Berle Mull, MD [facility] Pager: [phone] 05/26/2016 6:50 PM  If 7PM-7AM, please contact night-coverage at [email or web address], password [facility]

## 2016-05-26 NOTE — Progress Notes (Signed)
  Echocardiogram 2D Echocardiogram has been performed.  Cassandra Bonilla 05/26/2016, 11:05 AM

## 2016-05-27 ENCOUNTER — Encounter (HOSPITAL_COMMUNITY): Payer: Self-pay | Admitting: Gastroenterology

## 2016-05-27 ENCOUNTER — Inpatient Hospital Stay (HOSPITAL_COMMUNITY): Payer: Medicare Other

## 2016-05-27 DIAGNOSIS — E1122 Type 2 diabetes mellitus with diabetic chronic kidney disease: Secondary | ICD-10-CM

## 2016-05-27 DIAGNOSIS — K921 Melena: Secondary | ICD-10-CM

## 2016-05-27 DIAGNOSIS — Z794 Long term (current) use of insulin: Secondary | ICD-10-CM

## 2016-05-27 LAB — GLUCOSE, CAPILLARY
GLUCOSE-CAPILLARY: 181 mg/dL — AB (ref 65–99)
GLUCOSE-CAPILLARY: 309 mg/dL — AB (ref 65–99)
GLUCOSE-CAPILLARY: 215 mg/dL — AB (ref 65–99)
Glucose-Capillary: 245 mg/dL — ABNORMAL HIGH (ref 65–99)
Glucose-Capillary: 106 mg/dL — ABNORMAL HIGH (ref 65–99)
Glucose-Capillary: 56 mg/dL — ABNORMAL LOW (ref 65–99)

## 2016-05-27 LAB — CBC
HCT: 27.5 % — ABNORMAL LOW (ref 36.0–46.0)
Hemoglobin: 8.5 g/dL — ABNORMAL LOW (ref 12.0–15.0)
MCH: 26.6 pg (ref 26.0–34.0)
MCHC: 30.9 g/dL (ref 30.0–36.0)
MCV: 85.9 fL (ref 78.0–100.0)
PLATELETS: 201 10*3/uL (ref 150–400)
RBC: 3.2 MIL/uL — AB (ref 3.87–5.11)
RDW: 17.8 % — ABNORMAL HIGH (ref 11.5–15.5)
WBC: 7 10*3/uL (ref 4.0–10.5)

## 2016-05-27 LAB — RENAL FUNCTION PANEL
ANION GAP: 14 (ref 5–15)
Albumin: 2.7 g/dL — ABNORMAL LOW (ref 3.5–5.0)
BUN: 113 mg/dL — ABNORMAL HIGH (ref 6–20)
CALCIUM: 7.7 mg/dL — AB (ref 8.9–10.3)
CHLORIDE: 93 mmol/L — AB (ref 101–111)
CO2: 26 mmol/L (ref 22–32)
CREATININE: 3.42 mg/dL — AB (ref 0.44–1.00)
GFR calc Af Amer: 14 mL/min — ABNORMAL LOW (ref >60)
GFR, EST NON AFRICAN AMERICAN: 12 mL/min — AB (ref >60)
Glucose, Bld: 316 mg/dL — ABNORMAL HIGH (ref 65–99)
Phosphorus: 5 mg/dL — ABNORMAL HIGH (ref 2.5–4.6)
Potassium: 3.4 mmol/L — ABNORMAL LOW (ref 3.5–5.1)
Sodium: 133 mmol/L — ABNORMAL LOW (ref 135–145)

## 2016-05-27 LAB — HEMOGLOBIN A1C
Hgb A1c MFr Bld: 8.5 % — ABNORMAL HIGH (ref 4.8–5.6)
Mean Plasma Glucose: 197 mg/dL

## 2016-05-27 LAB — IRON AND TIBC
IRON: 21 ug/dL — AB (ref 28–170)
SATURATION RATIOS: 5 % — AB (ref 10.4–31.8)
TIBC: 424 ug/dL (ref 250–450)
UIBC: 403 ug/dL

## 2016-05-27 MED ORDER — SODIUM CHLORIDE 0.9 % IV SOLN: INTRAVENOUS | Status: DC

## 2016-05-27 MED ORDER — METOCLOPRAMIDE HCL 5 MG/ML IJ SOLN
10.0000 mg | Freq: Once | INTRAMUSCULAR | Status: AC
  Administered 2016-05-28: 10 mg via INTRAVENOUS
  Filled 2016-05-27: qty 2

## 2016-05-27 MED ORDER — PEG 3350-KCL-NA BICARB-NACL 420 G PO SOLR
2000.0000 mL | Freq: Once | ORAL | Status: AC
  Administered 2016-05-27: 2000 mL via ORAL
  Filled 2016-05-27: qty 4000

## 2016-05-27 MED ORDER — DEXTROSE 50 % IV SOLN
INTRAVENOUS | Status: AC
Start: 2016-05-27 — End: 2016-05-27
  Administered 2016-05-27: 50 mL
  Filled 2016-05-27: qty 50

## 2016-05-27 MED ORDER — BISACODYL 5 MG PO TBEC
10.0000 mg | DELAYED_RELEASE_TABLET | Freq: Once | ORAL | Status: AC
  Administered 2016-05-27: 10 mg via ORAL
  Filled 2016-05-27: qty 2

## 2016-05-27 MED ORDER — POTASSIUM CHLORIDE 20 MEQ PO PACK
20.0000 meq | PACK | Freq: Two times a day (BID) | ORAL | Status: AC
  Administered 2016-05-27 (×2): 20 meq via ORAL
  Filled 2016-05-27 (×2): qty 1

## 2016-05-27 MED ORDER — METOCLOPRAMIDE HCL 5 MG/ML IJ SOLN
10.0000 mg | Freq: Once | INTRAMUSCULAR | Status: AC
  Administered 2016-05-27: 10 mg via INTRAVENOUS
  Filled 2016-05-27: qty 2

## 2016-05-27 MED ORDER — FUROSEMIDE 10 MG/ML IJ SOLN
80.0000 mg | Freq: Two times a day (BID) | INTRAMUSCULAR | Status: DC
  Administered 2016-05-27 – 2016-05-28 (×2): 80 mg via INTRAVENOUS
  Filled 2016-05-27 (×2): qty 8

## 2016-05-27 MED ORDER — INSULIN ASPART 100 UNIT/ML ~~LOC~~ SOLN
0.0000 [IU] | SUBCUTANEOUS | Status: DC
  Administered 2016-05-27: 5 [IU] via SUBCUTANEOUS
  Administered 2016-05-28 (×2): 3 [IU] via SUBCUTANEOUS

## 2016-05-27 MED ORDER — PANTOPRAZOLE SODIUM 40 MG PO TBEC
40.0000 mg | DELAYED_RELEASE_TABLET | Freq: Every day | ORAL | Status: DC
  Administered 2016-05-27 – 2016-06-04 (×9): 40 mg via ORAL
  Filled 2016-05-27 (×10): qty 1

## 2016-05-27 NOTE — Care Management Note (Signed)
Case Management Note  Patient Details  Name: Cassandra Bonilla MRN: [id number] Date of Birth: 05/21/1941  Subjective/Objective:     Presents with GIB, AMS, lethargic, Patient was previously in [place] snf from last admission for 8-10 days then went home with spouse,  she has a rolling walker, a cane, and w/chair and a bsc.  She states she uses the w/chair mostly. Most likely will need pt eval.    9/11- per spouse, patient is active with [facility],- checking pt/inr  And meds and HHPT, will like to add a HHaide.  She has a pcp, she has insurance to cover medications, and she will be transported by car at dc.  Spouse will like to continue with [facility]. Her EGD (-) 9/10, protonix iv change to po today per RN. On lactulose for liver failure.                Action/Plan:   Expected Discharge Date:                  Expected Discharge Plan:  [place]  In-House Referral:     Discharge planning Services  CM Consult  Post Acute Care Choice:  Resumption of Svcs/PTA Provider Choice offered to:  Spouse  DME Arranged:    DME Agency:     HH Arranged:  RN, PT, Nurse's Aide [place] Agency:  [facility]  Status of Service:     If discussed at [place] of Stay Meetings, dates discussed:    Additional Comments:  Zenon Mayo, RN 05/27/2016, 5:29 PM

## 2016-05-27 NOTE — Progress Notes (Signed)
[facility] Progress Note   Assessment/ Plan:   1. CKD stage IV/ V - f/b Dr Signe Colt w [facility].  She continues to have excellent urine output with unchanged/slightly improved creatinine with slowly downward trending BUN (primarily driven by her GI bleed/protein load in gut). No acute dialysis needs noted at this time. 2.  Liver disease - unclear etiology of liver disease but manifest with coagulopathy and encephalopathy with elevated ammonia levels-ongoing treatment for hepatic encephalopathy with improvement of mental status. 3. GI bleed/acute blood loss anemia - EGD done yesterday showed small lateral hernia but no other obvious source of acute GI bleed and she currently reports small amount of hematochezia while tolerating a full liquid diet. Hemoglobin appears to be relatively stable and she does not have indications appear to have indications for PRBC transfusion. 4. Vol excess - started on furosemide yesterday after found to have elevated JVP and features of volume excess including findings on chest x-ray. We will be cautious with furosemide dose particularly with elevated BUN to avoid tipping her over into uremia. Volume status today appears to be much better with trace lower extremity edema and flat jugular veins. We will decrease furosemide to 80 mg IV twice a day.  5. HTN - not taking po yet, getting sched IV MTP 2.5 mg tid w good bp control 6. Gout on allopruinol 7. 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  Hypokalemia - replace to avoid worsening ammoniagenesis 9. Hyponatremia: Previously on metolazone but is currently on hold. Suspect free water excretion defect and contribution from some element of liver disease. Monitor would look diuretic therapy  Subjective:   Reports to be feeling better-denies any chest pain or shortness of breath. Family member at bedside reports mental status still not back to baseline.     Objective:   BP 132/69 (BP Location: Right Arm)   Pulse 92   Temp 97.9 F (36.6 C) (Oral)   Resp 17   Ht  (1.575 m)   Wt 74.7 kg (164 lb 10.9 oz)   SpO2 98%   BMI 30.12 kg/m   Intake/Output Summary (Last 24 hours) at 05/27/16 1207 Last data filed at 05/27/16 1146  Gross per 24 hour  Intake             1240 ml  Output             2500 ml  Net            -1260 ml   Weight change:   Physical Exam: [id number] resting in bed, appears chronically ill CVS: Pulse regular rhythm, S1 and S2 with 3/6 ESM Resp: Clear to auscultation, no rales Abd: Soft, flat, nontender [id number]+ bilateral lower extremity edema with minimal upper extremity edema noted   Imaging: Mr Brain Wo Contrast  Result Date: 05/25/2016 CLINICAL DATA:  Acute encephalopathy. EXAM: MRI HEAD WITHOUT CONTRAST TECHNIQUE: Multiplanar, multiecho pulse sequences of the brain and surrounding structures were obtained without intravenous contrast. COMPARISON:  Head CT from 2 days ago FINDINGS: Brain: No acute infarction, hemorrhage, hydrocephalus, extra-axial collection or mass lesion. Generalized cortical atrophy. 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 Mild confluent periventricular small vessel ischemic gliosis. Vascular: No evidence of major vessel occlusion Skull and upper cervical spine: Hypo intense appearance of the calvarium which is sclerotic on previous head CT, potentially related to patient's history of chronic kidney disease. No  focal masslike marrow lesion. Sinuses/Orbits: Right mastoid effusion with fluid levels, also seen 04/29/2016 by head CT. No obstructive process seen at the nasopharynx. Left cataract resection. Other: None. IMPRESSION: 1. No acute finding. 2. Chronic microvascular disease and mild cortical atrophy. 3. Chronic right mastoid effusion. Electronically Signed   By: Monte Fantasia M.D.   On: 05/25/2016 18:49   Dg Chest Port 1  View  Result Date: 05/25/2016 CLINICAL DATA:  Central line placement EXAM: PORTABLE CHEST 1 VIEW COMPARISON:  05/24/2016 FINDINGS: Cardiomegaly again noted. Status post median sternotomy. Again noted central mild vascular congestion and mild perihilar interstitial prominence suspicious for mild pulmonary edema. Probable small left pleural effusion with left basilar atelectasis or infiltrate. Right IJ central line with tip in SVC. No pneumothorax. IMPRESSION: Right IJ line with tip in SVC. No pneumothorax. Cardiomegaly. Status post CABG. Central vascular congestion and mild perihilar interstitial prominence suspicious for mild interstitial edema. Electronically Signed   By: Lahoma Crocker M.D.   On: 05/25/2016 15:59    Labs: BMET  Recent Labs Lab 05/24/16 1853 05/25/16 0214 05/25/16 1659 05/26/16 0320 05/26/16 1830 05/27/16 0700  NA 136 136 135 136 132* 133*  K 3.4* 3.1* 2.8* 2.8* 3.6 3.4*  CL 96* 96* 96* 95* 93* 93*  CO2 25 26 25 27 24 26   GLUCOSE 103* 63* 127* 136* 290* 316*  BUN 129* 133* 124* 125* 117* 113*  CREATININE 3.50* 3.52* 3.55* 3.48* 3.55* 3.42*  CALCIUM 7.8* 7.7* 7.6* 7.5* 7.6* 7.7*  PHOS 6.5*  --  5.9* 5.9*  --  5.0*   CBC  Recent Labs Lab 05/25/16 0214 05/25/16 1700 05/26/16 0320 05/27/16 0700  WBC 8.5 7.6 7.8 7.0  NEUTROABS  --  5.9 5.9  --   HGB 8.9* 8.5* 8.2* 8.5*  HCT 27.6* 26.6* 25.8* 27.5*  MCV 82.4 82.9 84.0 85.9  PLT 182 191 169 201    Medications:    . cefTRIAXone (ROCEPHIN)  IV  1 g Intravenous Q24H  . darbepoetin (ARANESP) injection - NON-DIALYSIS  60 mcg Subcutaneous Q Sun-1800  . furosemide  120 mg Intravenous Q12H  . insulin aspart  0-9 Units Subcutaneous TID WC  . lactulose  20 g Oral BID  . pantoprazole  40 mg Oral Q0600  . rifaximin  400 mg Oral TID   Elmarie Shiley, MD 05/27/2016, 12:07 PM

## 2016-05-27 NOTE — Evaluation (Signed)
Physical Therapy Evaluation Patient Details Name: Cassandra Bonilla MRN: [id number] DOB: [birth year] Today's Date: 05/27/2016   History of Present Illness  75 y.o. female admitted to [facility] on 05/24/16 for nausea, decreased po intake and lethargy.  Found to have supratherapeutic INR and was given K+ and 2 units of plasma, 2 units of blood.  Pt underwent EGD with no significant results and GI and Internal medicine following.  Pt dx with acute encepholopathy.  Pt with significant PMHx of recent admission 8/5-8/18/17 for hypokalemia and acute on chronic heart failure.  She d/c to SNF for rehab and then to home.  Pt with other significant PMHx of HTN, gout, CAD, COPD, CKD III, and CABG.    Clinical Impression  Pt was able to stand EOB and take some side steps with one person assist and RW.  She did not feel strong enough (after a bought of peri care and rolling in the bed) to do much walking.  We will attempt gait out of the room tomorrow with a second person for safety.  I am hopeful that we can keep her strong enough to go home with her family's assist and [facility] services at discharge.   PT to follow acutely for deficits listed below.          Follow Up Recommendations Home health PT;Supervision/Assistance - 24 hour    Equipment Recommendations  None recommended by PT    Recommendations for Other Services   NA    Precautions / Restrictions Precautions Precautions: Fall      Mobility  Bed Mobility Overal bed mobility: Needs Assistance Bed Mobility: Supine to Sit;Sit to Supine;Rolling Rolling: Mod assist;+2 for physical assistance   Supine to sit: Min assist;HOB elevated Sit to supine: Mod assist   General bed mobility comments: Min assist to get to sitting from elevated HOB, manual assist to find bed rails, assist needed to help lift bil legs back into bed from sitting.   Transfers Overall transfer level: Needs assistance Equipment used: Rolling walker (2 wheeled) Transfers: Sit to/from Stand Sit  to Stand: Mod assist         General transfer comment: Mod assist to support trunk during transitions to get to standing.   Ambulation/Gait Ambulation/Gait assistance: Mod assist Ambulation Distance (Feet): 5 Feet Assistive device: Rolling walker (2 wheeled) Gait Pattern/deviations: Step-to pattern     General Gait Details: Pt side stepped at the side of the bed and used RW. She was leaning bil lower legs against the bed and using it for stability when moving, at times she would take hand off of RW cues to keep hands on RW in standing.  Pt did not feel up for attempting to walk due to fatigue.           Balance Overall balance assessment: Needs assistance Sitting-balance support: Feet supported;Bilateral upper extremity supported Sitting balance-Leahy Scale: Fair     Standing balance support: Bilateral upper extremity supported;Single extremity supported Standing balance-Leahy Scale: Poor                               Pertinent Vitals/Pain Pain Assessment: No/denies pain    Home Living Family/patient expects to be discharged to:: Private residence Living Arrangements: Spouse/significant other;Children Available Help at Discharge: Family;Available 24 hours/day Type of Home: House Home Access: Stairs to enter Entrance Stairs-Rails: Psychiatric nurse of Steps: 6 Home Layout: One level Home Equipment: Walker - 2 wheels;Tub bench Additional Comments:  Active with HHPT and RN    Prior Function Level of Independence: Needs assistance   Gait / Transfers Assistance Needed: Pt was walking with supervision and RW.               Extremity/Trunk Assessment   Upper Extremity Assessment: Generalized weakness           Lower Extremity Assessment: Generalized weakness      Cervical / Trunk Assessment: Kyphotic  Communication      Cognition Arousal/Alertness: Awake/alert Behavior During Therapy: WFL for tasks  assessed/performed Overall Cognitive Status: History of cognitive impairments - at baseline (per son, this is her baseline)                               Assessment/Plan    PT Assessment Patient needs continued PT services  PT Diagnosis Difficulty walking;Abnormality of gait;Generalized weakness   PT Problem List Decreased strength;Decreased activity tolerance;Decreased mobility;Decreased balance;Decreased knowledge of use of DME  PT Treatment Interventions DME instruction;Gait training;Stair training;Functional mobility training;Therapeutic activities;Therapeutic exercise;Balance training;Patient/family education   PT Goals (Current goals can be found in the Care Plan section) Acute Rehab PT Goals Patient Stated Goal: to keep her strength while here in the hospital PT Goal Formulation: With patient/family Time For Goal Achievement: 06/10/16 Potential to Achieve Goals: Good    Frequency Min 3X/week           End of Session Equipment Utilized During Treatment: Gait belt Activity Tolerance: Patient limited by fatigue Patient left: in bed;with call bell/phone within reach;with bed alarm set;with family/visitor present Nurse Communication: Mobility status         Time: 1430-1507 PT Time Calculation (min) (ACUTE ONLY): 37 min   Charges:   PT Evaluation $PT Eval Moderate Complexity: 1 Procedure PT Treatments $Therapeutic Activity: 8-22 mins        Parsa Rickett B. Samaiya Awadallah, PT, DPT [phone]   05/27/2016, 3:28 PM

## 2016-05-27 NOTE — Progress Notes (Signed)
Patient's BG was 56 at 2230. Will administer 50 mL of 50% Dextrose and recheck BG in 15 mins.

## 2016-05-27 NOTE — Progress Notes (Signed)
Daily Rounding Note  05/27/2016, 8:41 AM  LOS: 3 days   SUBJECTIVE:   Chief complaint: dark, bloody stool.     Small amount of blood seen in a small stool this AM.  Pt denies abd pain, nausea.  She is hungry,  Current diet is full liquid.     OBJECTIVE:         Vital signs in last 24 hours:    Temp:  [97 F (36.1 C)-98.4 F (36.9 C)] 98.2 F (36.8 C) (09/11 0742) Pulse Rate:  [64-90] 66 (09/11 0800) Resp:  [11-24] 24 (09/11 0800) BP: (116-146)/(40-97) 143/59 (09/11 0800) SpO2:  [92 %-100 %] 97 % (09/11 0800) Last BM Date: 05/25/16 Filed Weights   05/24/16 0329  Weight: 74.7 kg (164 lb 10.9 oz)   General: pleasant, alert.  Looks chronically ill.    Heart: RRR.  + 3/6 murmer.   Chest: clear bil though BS on right diminished Abdomen: soft, NT, ND.  Active BS.    Extremities: Bil LE edema Neuro/Psych:  Alert, appropriated. Not oriented to year.  Knows "hospital" and self.  No asterixis.   Intake/Output from previous day: 09/10 0701 - 09/11 0700 In: 710 [P.O.:360; I.V.:300; IV Piggyback:50] Out: [id number] [Urine:1650]  Intake/Output this shift: Total I/O In: 50 [I.V.:50] Out: 500 [Urine:500]  Lab Results:  Recent Labs  05/25/16 1700 05/26/16 0320 05/27/16 0700  WBC 7.6 7.8 7.0  HGB 8.5* 8.2* 8.5*  HCT 26.6* 25.8* 27.5*  PLT 191 169 201   BMET  Recent Labs  05/26/16 0320 05/26/16 1830 05/27/16 0700  NA 136 132* 133*  K 2.8* 3.6 3.4*  CL 95* 93* 93*  CO2 27 24 26   GLUCOSE 136* 290* 316*  BUN 125* 117* 113*  CREATININE 3.48* 3.55* 3.42*  CALCIUM 7.5* 7.6* 7.7*   LFT  Recent Labs  05/24/16 1853 05/25/16 0214 05/25/16 1659 05/26/16 0320 05/27/16 0700  PROT 7.2 6.5  --  6.3*  --   ALBUMIN 2.8* 2.5* 2.7* 2.5* 2.7*  AST 35 34  --  56*  --   ALT 21 21  --  30  --   ALKPHOS 200* 193*  --  169*  --   BILITOT 1.8* 1.6*  --  1.2  --    PT/INR  Recent Labs  05/25/16 0214 05/26/16 0320    LABPROT 25.4* 18.6*  INR 2.26 1.53   Hepatitis Panel No results for input(s): HEPBSAG, HCVAB, HEPAIGM, HEPBIGM in the last 72 hours.  Studies/Results: Mr Brain Wo Contrast  Result Date: 05/25/2016 CLINICAL DATA:  Acute encephalopathy. EXAM: MRI HEAD WITHOUT CONTRAST TECHNIQUE: Multiplanar, multiecho pulse sequences of the brain and surrounding structures were obtained without intravenous contrast. COMPARISON:  Head CT from 2 days ago FINDINGS: Brain: No acute infarction, hemorrhage, hydrocephalus, extra-axial collection or mass lesion. Generalized cortical atrophy. 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 Mild confluent periventricular small vessel ischemic gliosis. Vascular: No evidence of major vessel occlusion Skull and upper cervical spine: Hypo intense appearance of the calvarium which is sclerotic on previous head CT, potentially related to patient's history of chronic kidney disease. No focal masslike marrow lesion. Sinuses/Orbits: Right mastoid effusion with fluid levels, also seen 04/29/2016 by head CT. No obstructive process seen at the nasopharynx. Left cataract resection. Other: None. IMPRESSION: 1. No acute finding. 2. Chronic microvascular disease and mild cortical atrophy. 3. Chronic right mastoid effusion. Electronically Signed  By: Monte Fantasia M.D.   On: 05/25/2016 18:49   Dg Chest Port 1 View  Result Date: 05/25/2016 CLINICAL DATA:  Central line placement EXAM: PORTABLE CHEST 1 VIEW COMPARISON:  05/24/2016 FINDINGS: Cardiomegaly again noted. Status post median sternotomy. Again noted central mild vascular congestion and mild perihilar interstitial prominence suspicious for mild pulmonary edema. Probable small left pleural effusion with left basilar atelectasis or infiltrate. Right IJ central line with tip in SVC. No pneumothorax. IMPRESSION: Right IJ line with tip in SVC. No pneumothorax. Cardiomegaly. Status post CABG.  Central vascular congestion and mild perihilar interstitial prominence suspicious for mild interstitial edema. Electronically Signed   By: Lahoma Crocker M.D.   On: 05/25/2016 15:59   Scheduled Meds: . cefTRIAXone (ROCEPHIN)  IV  1 g Intravenous Q24H  . darbepoetin (ARANESP) injection - NON-DIALYSIS  60 mcg Subcutaneous Q Sun-1800  . furosemide  120 mg Intravenous Q12H  . insulin aspart  0-9 Units Subcutaneous TID WC  . lactulose  20 g Oral BID  . rifaximin  400 mg Oral TID   Continuous Infusions: . sodium chloride    . pantoprozole (PROTONIX) infusion 8 mg/hr (05/26/16 2000)   PRN Meds:.acetaminophen **OR** acetaminophen, methocarbamol (ROBAXIN)  IV, ondansetron **OR** ondansetron (ZOFRAN) IV   ASSESMENT:   *  IDA.  Chronic blood loss. Longstanding.  Acute hematochezia, melena in setting of elevated INR (3.7 at [facility] PT transfer).   9/10 EGD: small HH.  Attempted colonoscopy 01/2016 aborted due to inability to prep.  S/p PRBC x 2.  Hgb stable.  Now started on Aranesp.    * Liver dz of unclear cause.  04/2016 liver biopsy: Hepatic sinusoidal dilatation with vague, focal noncaseating granulomatous inflammation.  Differential causes: CHF vs renal cause vs drugs.  *  Encephalopathy, acute on chronic. Hx elevated ammonia level.  On Lactulose, Rifaxamin at home.   *  Acute on chronic right heart failure.   *  A fib.  Coumadin on hold. S/p FFP x 2.   Coumadin previously discontinued 01/2016 due to GIB/anemia, restarted during 04/20/2016 admission.   *  CKD stage 4-5, AKI.    *  [place] (protein cal malnutrition).     PLAN   *  Stop PPI drip.  Switch to Protonix po once daily   *  Advance to solid, carb mod diet?    *  ? Pursue colonoscopy with bowel prep via NGT if necessary.   *  Wonder about benefit vs risk re Coumadin.    Cassandra Bonilla  05/27/2016, 8:41 AM Pager: [phone]

## 2016-05-27 NOTE — Progress Notes (Addendum)
[place] TEAM 1 - Stepdown/ICU TEAM  Louie Boston  ID:[id number] DOB: Dec 12, 1940 DOA: 05/24/2016 PCP: Marco Collie, MD    Brief Narrative:  75 yo w/ hx of A. fib on anticoagulation, type II DM, CKD, and cryptogenic liver cirrhosis who was admitted on 9/8/ with confusion and fatigue.  She was found to have hepatic encephalopathy as well as a GI bleed.  Subjective: The patient is awake and conversant but not quite yet back to her baseline per her husband at the bedside.  She denies chest pain shortness of breath fevers chills nausea or vomiting.  She is a little slow to respond to questions.  Assessment & Plan:  GIB - Acute hematochezia, melena  anticoagulation reversed with vitamin & FFP - EGD suggested no acute bleeding - Colonoscopy not performed due to inability to tolerate prep in the past - no evidence of further bleeding at this time   Coagulopathy  INR 3.7 at presentation - reversed with vitamin & FFP - in the setting of liver disease and spontaneous GI bleeding and does not appear that ongoing use of anticoagulation is safe in this patient  Cryptogenic Liver cirrhosis w/ Hepatic encephalopathy ceftriaxone for SBP prophylaxis - encephalopathy worsened by upper GI bleed contributing to severe uremia  Acute encephalopathy Due to hepatic encephalopathy plus uremia - Negative MRI brain - Ammonia level corrected - BUN slowly drifting downward  CKD Stage 4/5 Nephrology following   Chronic Afib Rate controlled - not safe for anticoag at this time   Acute on chronic systolic CHF  EF [id number] via TTE 9/10, improved from 35% August 2017 - follow daily weights - Lasix dosing per Nephrology - no ACEi/ARB for obvious reasons  Filed Weights   05/24/16 0329  Weight: 74.7 kg (164 lb 10.9 oz)    Hypokalemia Due to diuresis and poor intake plus GI losses - replace and follow  Elevated troponin TTE noted improvement in ejection fraction from 35% > 45% w/ no significant wall motion  abnormality  DM2 CBG poorly controlled - adjust tx and follow trend   Gout Quiescent   DVT prophylaxis: SCDs Code Status: FULL CODE Family Communication: no family present at time of exam  Disposition Plan:   Consultants:  GI Nephrology   Antimicrobials:  Rocephin 9/7 >  Objective: Blood pressure 132/69, pulse 92, temperature 97.9 F (36.6 C), temperature source Oral, resp. rate 17, height 5\' 2"  (1.575 m), weight 74.7 kg (164 lb 10.9 oz), SpO2 98 %.  Intake/Output Summary (Last 24 hours) at 05/27/16 1158 Last data filed at 05/27/16 1146  Gross per 24 hour  Intake             1240 ml  Output             2500 ml  Net            -1260 ml   Filed Weights   05/24/16 0329  Weight: 74.7 kg (164 lb 10.9 oz)    Examination: General: No acute respiratory distress - alert but slow to answer questions  Lungs: Clear to auscultation bilaterally without wheezes or crackles Cardiovascular: Regular rate without murmur gallop or rub normal S1 and S2 Abdomen: Nontender, nondistended, soft, bowel sounds positive, no rebound, no ascites, no appreciable mass Extremities: No significant cyanosis, clubbing - 1+ edema bilateral lower extremities  CBC:  Recent Labs Lab 05/24/16 1853 05/25/16 0214 05/25/16 1700 05/26/16 0320 05/27/16 0700  WBC 9.5 8.5 7.6 7.8 7.0  NEUTROABS  --   --  5.9 5.9  --   HGB 9.5* 8.9* 8.5* 8.2* 8.5*  HCT 30.0* 27.6* 26.6* 25.8* 27.5*  MCV 83.1 82.4 82.9 84.0 85.9  PLT 188 182 191 169 [id number]   Basic Metabolic Panel:  Recent Labs Lab 05/24/16 1853 05/25/16 0214 05/25/16 1659 05/26/16 0320 05/26/16 1830 05/27/16 0700  NA 136 136 135 136 132* 133*  K 3.4* 3.1* 2.8* 2.8* 3.6 3.4*  CL 96* 96* 96* 95* 93* 93*  CO2 25 26 25 27 24 26   GLUCOSE 103* 63* 127* 136* 290* 316*  BUN 129* 133* 124* 125* 117* 113*  CREATININE 3.50* 3.52* 3.55* 3.48* 3.55* 3.42*  CALCIUM 7.8* 7.7* 7.6* 7.5* 7.6* 7.7*  MG 2.0  --   --  1.8  --   --   PHOS 6.5*  --  5.9* 5.9*  --   5.0*   GFR: Estimated Creatinine Clearance: 13.4 mL/min (by C-G formula based on SCr of 3.42 mg/dL).  Liver Function Tests:  Recent Labs Lab 05/24/16 1853 05/25/16 0214 05/25/16 1659 05/26/16 0320 05/27/16 0700  AST 35 34  --  56*  --   ALT 21 21  --  30  --   ALKPHOS 200* 193*  --  169*  --   BILITOT 1.8* 1.6*  --  1.2  --   PROT 7.2 6.5  --  6.3*  --   ALBUMIN 2.8* 2.5* 2.7* 2.5* 2.7*    Recent Labs Lab 05/24/16 1853 05/26/16 2100  AMMONIA 41* 41*    Coagulation Profile:  Recent Labs Lab 05/24/16 0711 05/25/16 0214 05/26/16 0320  INR 2.78 2.26 1.53    Cardiac Enzymes:  Recent Labs Lab 05/24/16 0523 05/24/16 1853 05/24/16 2210 05/25/16 0214  TROPONINI 0.08* 0.11* 0.12* 0.12*    HbA1C: Hgb A1c MFr Bld  Date/Time Value Ref Range Status  05/24/2016 06:53 PM 7.4 (H) 4.8 - 5.6 % Final    Comment:    (NOTE)         Pre-diabetes: 5.7 - 6.4         Diabetes: >6.4         Glycemic control for adults with diabetes: <7.0   05/24/2016 05:23 AM 8.5 (H) 4.8 - 5.6 % Final    Comment:    (NOTE)         Pre-diabetes: 5.7 - 6.4         Diabetes: >6.4         Glycemic control for adults with diabetes: <7.0     CBG:  Recent Labs Lab 05/25/16 2353 05/26/16 0445 05/26/16 0807 05/26/16 1516 05/26/16 2150  GLUCAP 129* 133* 141* 160* 264*    Recent Results (from the past 240 hour(s))  MRSA PCR Screening     Status: None   Collection Time: 05/24/16  6:56 AM  Result Value Ref Range Status   MRSA by PCR NEGATIVE NEGATIVE Final    Comment:        The GeneXpert MRSA Assay (FDA approved for NASAL specimens only), is one component of a comprehensive MRSA colonization surveillance program. It is not intended to diagnose MRSA infection nor to guide or monitor treatment for MRSA infections.      Scheduled Meds: . cefTRIAXone (ROCEPHIN)  IV  1 g Intravenous Q24H  . darbepoetin (ARANESP) injection - NON-DIALYSIS  60 mcg Subcutaneous Q Sun-1800  .  furosemide  120 mg Intravenous Q12H  . insulin aspart  0-9 Units Subcutaneous TID WC  . lactulose  20 g Oral  BID  . pantoprazole  40 mg Oral Q0600  . rifaximin  400 mg Oral TID   Continuous Infusions: . sodium chloride       LOS: 3 days   Cherene Altes, MD [facility] Office  [phone] Pager - Text Page per Shea Evans as per below:  On-Call/Text Page:      [email or web address]      password [email or web address]  If 7PM-7AM, please contact night-coverage [email or web address] Password [email or web address] 05/27/2016, 11:58 AM

## 2016-05-28 ENCOUNTER — Encounter (HOSPITAL_COMMUNITY)
Admission: AD | Disposition: A | Discharge status: Home Health | Payer: Self-pay | Source: Other Acute Inpatient Hospital | Attending: Internal Medicine

## 2016-05-28 ENCOUNTER — Inpatient Hospital Stay (HOSPITAL_COMMUNITY): Payer: Medicare Other | Admitting: Anesthesiology

## 2016-05-28 ENCOUNTER — Encounter (HOSPITAL_COMMUNITY): Payer: Self-pay

## 2016-05-28 DIAGNOSIS — K7469 Other cirrhosis of liver: Secondary | ICD-10-CM

## 2016-05-28 DIAGNOSIS — D122 Benign neoplasm of ascending colon: Secondary | ICD-10-CM

## 2016-05-28 DIAGNOSIS — D124 Benign neoplasm of descending colon: Secondary | ICD-10-CM

## 2016-05-28 DIAGNOSIS — E118 Type 2 diabetes mellitus with unspecified complications: Secondary | ICD-10-CM

## 2016-05-28 DIAGNOSIS — I272 Other secondary pulmonary hypertension: Secondary | ICD-10-CM

## 2016-05-28 DIAGNOSIS — IMO0002 Reserved for concepts with insufficient information to code with codable children: Secondary | ICD-10-CM

## 2016-05-28 DIAGNOSIS — D509 Iron deficiency anemia, unspecified: Secondary | ICD-10-CM

## 2016-05-28 DIAGNOSIS — K635 Polyp of colon: Secondary | ICD-10-CM

## 2016-05-28 DIAGNOSIS — D123 Benign neoplasm of transverse colon: Secondary | ICD-10-CM

## 2016-05-28 DIAGNOSIS — E1165 Type 2 diabetes mellitus with hyperglycemia: Secondary | ICD-10-CM

## 2016-05-28 HISTORY — DX: Polyp of colon: K63.5

## 2016-05-28 HISTORY — PX: GIVENS CAPSULE STUDY: SHX5432

## 2016-05-28 HISTORY — PX: COLONOSCOPY: SHX5424

## 2016-05-28 LAB — TYPE AND SCREEN
ABO/RH(D): O POS
Antibody Screen: NEGATIVE
UNIT DIVISION: 0
UNIT DIVISION: 0
Unit division: 0

## 2016-05-28 LAB — GLUCOSE, CAPILLARY
GLUCOSE-CAPILLARY: 87 mg/dL (ref 65–99)
GLUCOSE-CAPILLARY: 79 mg/dL (ref 65–99)
GLUCOSE-CAPILLARY: 129 mg/dL — AB (ref 65–99)
GLUCOSE-CAPILLARY: 163 mg/dL — AB (ref 65–99)
Glucose-Capillary: 124 mg/dL — ABNORMAL HIGH (ref 65–99)
Glucose-Capillary: 124 mg/dL — ABNORMAL HIGH (ref 65–99)
Glucose-Capillary: 129 mg/dL — ABNORMAL HIGH (ref 65–99)
Glucose-Capillary: 161 mg/dL — ABNORMAL HIGH (ref 65–99)

## 2016-05-28 LAB — RENAL FUNCTION PANEL
Albumin: 2.7 g/dL — ABNORMAL LOW (ref 3.5–5.0)
Anion gap: 12 (ref 5–15)
BUN: 98 mg/dL — AB (ref 6–20)
CALCIUM: 8 mg/dL — AB (ref 8.9–10.3)
CO2: 27 mmol/L (ref 22–32)
CREATININE: 3.11 mg/dL — AB (ref 0.44–1.00)
Chloride: 94 mmol/L — ABNORMAL LOW (ref 101–111)
GFR, EST AFRICAN AMERICAN: 16 mL/min — AB (ref >60)
GFR, EST NON AFRICAN AMERICAN: 14 mL/min — AB (ref >60)
Glucose, Bld: 79 mg/dL (ref 65–99)
Phosphorus: 4.2 mg/dL (ref 2.5–4.6)
Potassium: 3.1 mmol/L — ABNORMAL LOW (ref 3.5–5.1)
SODIUM: 133 mmol/L — AB (ref 135–145)

## 2016-05-28 LAB — CBC
HCT: 28.2 % — ABNORMAL LOW (ref 36.0–46.0)
Hemoglobin: 8.6 g/dL — ABNORMAL LOW (ref 12.0–15.0)
MCH: 25.7 pg — ABNORMAL LOW (ref 26.0–34.0)
MCHC: 30.5 g/dL (ref 30.0–36.0)
MCV: 84.4 fL (ref 78.0–100.0)
PLATELETS: 204 10*3/uL (ref 150–400)
RBC: 3.34 MIL/uL — AB (ref 3.87–5.11)
RDW: 17.6 % — AB (ref 11.5–15.5)
WBC: 6.8 10*3/uL (ref 4.0–10.5)

## 2016-05-28 LAB — MAGNESIUM: Magnesium: 1.5 mg/dL — ABNORMAL LOW (ref 1.7–2.4)

## 2016-05-28 SURGERY — IMAGING PROCEDURE, GI TRACT, INTRALUMINAL, VIA CAPSULE
Anesthesia: LOCAL

## 2016-05-28 SURGERY — COLONOSCOPY
Anesthesia: Monitor Anesthesia Care

## 2016-05-28 MED ORDER — HYDROXYZINE HCL 25 MG PO TABS
25.0000 mg | ORAL_TABLET | Freq: Three times a day (TID) | ORAL | Status: DC | PRN
Start: 2016-05-28 — End: 2016-06-04
  Administered 2016-05-28 – 2016-06-04 (×12): 25 mg via ORAL
  Filled 2016-05-28 (×14): qty 1

## 2016-05-28 MED ORDER — INSULIN ASPART 100 UNIT/ML ~~LOC~~ SOLN
0.0000 [IU] | Freq: Three times a day (TID) | SUBCUTANEOUS | Status: DC
  Administered 2016-05-29: 2 [IU] via SUBCUTANEOUS
  Administered 2016-05-29: 5 [IU] via SUBCUTANEOUS
  Administered 2016-05-29 – 2016-05-30 (×2): 3 [IU] via SUBCUTANEOUS
  Administered 2016-05-30: 2 [IU] via SUBCUTANEOUS
  Administered 2016-05-30 – 2016-05-31 (×4): 5 [IU] via SUBCUTANEOUS
  Administered 2016-06-01 (×3): 3 [IU] via SUBCUTANEOUS
  Administered 2016-06-02: 2 [IU] via SUBCUTANEOUS
  Administered 2016-06-02 – 2016-06-03 (×2): 3 [IU] via SUBCUTANEOUS
  Administered 2016-06-03: 5 [IU] via SUBCUTANEOUS

## 2016-05-28 MED ORDER — POTASSIUM CHLORIDE 10 MEQ/100ML IV SOLN
10.0000 meq | INTRAVENOUS | Status: AC
  Administered 2016-05-28 – 2016-05-29 (×5): 10 meq via INTRAVENOUS
  Filled 2016-05-28 (×5): qty 100

## 2016-05-28 MED ORDER — MAGNESIUM SULFATE 2 GM/50ML IV SOLN
2.0000 g | Freq: Once | INTRAVENOUS | Status: AC
  Administered 2016-05-28: 2 g via INTRAVENOUS
  Filled 2016-05-28: qty 50

## 2016-05-28 MED ORDER — POTASSIUM CHLORIDE 20 MEQ PO PACK: 20.0000 meq | PACK | Freq: Three times a day (TID) | ORAL | Status: DC

## 2016-05-28 MED ORDER — PROPOFOL 500 MG/50ML IV EMUL
INTRAVENOUS | Status: DC | PRN
  Administered 2016-05-28: 25 ug/kg/min via INTRAVENOUS

## 2016-05-28 MED ORDER — SODIUM CHLORIDE 0.9 % IV SOLN
INTRAVENOUS | Status: DC | PRN
  Administered 2016-05-28: 12:00:00 via INTRAVENOUS

## 2016-05-28 MED ORDER — FUROSEMIDE 10 MG/ML IJ SOLN
80.0000 mg | Freq: Two times a day (BID) | INTRAMUSCULAR | Status: DC
  Administered 2016-05-28 – 2016-05-30 (×4): 80 mg via INTRAVENOUS
  Filled 2016-05-28 (×4): qty 8

## 2016-05-28 MED ORDER — METOPROLOL TARTRATE 5 MG/5ML IV SOLN
2.5000 mg | Freq: Three times a day (TID) | INTRAVENOUS | Status: DC
  Administered 2016-05-28 – 2016-05-30 (×5): 2.5 mg via INTRAVENOUS
  Filled 2016-05-28 (×5): qty 5

## 2016-05-28 MED ORDER — FUROSEMIDE 80 MG PO TABS
80.0000 mg | ORAL_TABLET | Freq: Two times a day (BID) | ORAL | Status: DC
  Filled 2016-05-28: qty 1

## 2016-05-28 MED ORDER — POTASSIUM CHLORIDE CRYS ER 20 MEQ PO TBCR
20.0000 meq | EXTENDED_RELEASE_TABLET | Freq: Three times a day (TID) | ORAL | Status: DC
  Filled 2016-05-28: qty 1

## 2016-05-28 SURGICAL SUPPLY — 1 items: TOWEL COTTON PACK 4EA (MISCELLANEOUS) ×4 IMPLANT

## 2016-05-28 NOTE — Anesthesia Postprocedure Evaluation (Signed)
Anesthesia Post Note  Patient: Cassandra Bonilla  Procedure(s) Performed: Procedure(s) (LRB): COLONOSCOPY (N/A)  Patient location during evaluation: PACU Anesthesia Type: MAC Level of consciousness: awake and alert Pain management: pain level controlled Vital Signs Assessment: post-procedure vital signs reviewed and stable Respiratory status: spontaneous breathing, nonlabored ventilation, respiratory function stable and patient connected to nasal cannula oxygen Cardiovascular status: stable and blood pressure returned to baseline Anesthetic complications: no    Last Vitals:  Vitals:   05/28/16 1250 05/28/16 1418  BP:  132/76  Pulse: 96 98  Resp: 17 19  Temp:  36.9 C    Last Pain:  Vitals:   05/28/16 1418  TempSrc: Oral  PainSc:                  Jayceion Lisenby DAVID

## 2016-05-28 NOTE — Progress Notes (Signed)
Givens capsule swallowed at 28, pt's family verbalized understanding, report given to [facility] [place], Therapist, sports. Brt, rn

## 2016-05-28 NOTE — Anesthesia Procedure Notes (Signed)
Procedure Name: MAC Date/Time: 05/28/2016 11:50 AM Performed by: Myna Bright Pre-anesthesia Checklist: Patient identified, Emergency Drugs available, Suction available, Patient being monitored and Timeout performed Patient Re-evaluated:Patient Re-evaluated prior to inductionOxygen Delivery Method: Simple face mask Intubation Type: IV induction Dental Injury: Teeth and Oropharynx as per pre-operative assessment

## 2016-05-28 NOTE — Anesthesia Preprocedure Evaluation (Addendum)
Anesthesia Evaluation  Patient identified by MRN, date of birth, ID band Patient awake    Reviewed: Allergy & Precautions, H&P , NPO status , Patient's Chart, lab work & pertinent test results  Airway Mallampati: II  TM Distance: >3 FB Neck ROM: Full    Dental no notable dental hx. (+) Edentulous Upper, Edentulous Lower, Dental Advisory Given   Pulmonary COPD, former smoker,    Pulmonary exam normal breath sounds clear to auscultation       Cardiovascular hypertension, + CAD, + CABG and +CHF  + dysrhythmias Atrial Fibrillation  Rhythm:Irregular Rate:Normal     Neuro/Psych negative neurological ROS  negative psych ROS   GI/Hepatic negative GI ROS, (+) Cirrhosis       , Encephalopathy   Endo/Other  diabetes  Renal/GU Renal InsufficiencyRenal disease  negative genitourinary   Musculoskeletal   Abdominal   Peds  Hematology negative hematology ROS (+) anemia ,   Anesthesia Other Findings   Reproductive/Obstetrics negative OB ROS                            Anesthesia Physical Anesthesia Plan  ASA: IV  Anesthesia Plan: MAC   Post-op Pain Management:    Induction: Intravenous  Airway Management Planned: Simple Face Mask  Additional Equipment:   Intra-op Plan:   Post-operative Plan:   Informed Consent: I have reviewed the patients History and Physical, chart, labs and discussed the procedure including the risks, benefits and alternatives for the proposed anesthesia with the patient or authorized representative who has indicated his/her understanding and acceptance.   Dental advisory given  Plan Discussed with: CRNA  Anesthesia Plan Comments:         Anesthesia Quick Evaluation

## 2016-05-28 NOTE — Op Note (Signed)
[facility] Patient Name: Cassandra Bonilla Procedure Date : 05/28/2016 MRN: [id number] Attending MD: Carlota Raspberry. Havery Moros , MD Date of Birth: 1941-07-03 CSN: [id number] Age: 75 Admit Type: Inpatient Procedure:                Colonoscopy Indications:              Unexplained iron deficiency anemia, history of GI                            bleeding of unclear etiology, EGD negative, history                            of coumadin use for atrial fibrillation Providers:                Carlota Raspberry. Tymeshia Awan, MDAutumn Sheppard Plumber, RN, ,                            Elspeth Cho Tech., Technician Referring MD:              Medicines:                Monitored Anesthesia Care Complications:            No immediate complications. Estimated blood loss:                            Minimal. Estimated Blood Loss:     Estimated blood loss was minimal. Procedure:                Pre-Anesthesia Assessment:                           - Prior to the procedure, a History and Physical                            was performed, and patient medications and                            allergies were reviewed. The patient's tolerance of                            previous anesthesia was also reviewed. The risks                            and benefits of the procedure and the sedation                            options and risks were discussed with the patient.                            All questions were answered, and informed consent                            was obtained. Prior Anticoagulants: The patient has  taken Coumadin (warfarin), last dose was 5 days                            prior to procedure. ASA Grade Assessment: III - A                            patient with severe systemic disease. After                            reviewing the risks and benefits, the patient was                            deemed in satisfactory condition to undergo the   procedure.                           After obtaining informed consent, the colonoscope                            was passed under direct vision. Throughout the                            procedure, the patient's blood pressure, pulse, and                            oxygen saturations were monitored continuously. The                            EC-[id number] scope was introduced through                            the anus and advanced to the the cecum, identified                            by appendiceal orifice and ileocecal valve. The                            colonoscopy was technically difficult and complex                            due to poor air retension. The patient tolerated                            the procedure well. The quality of the bowel                            preparation was adequate. The ileocecal valve,                            appendiceal orifice, and rectum were photographed. Scope In: 11:51:27 AM Scope Out: 12:28:25 PM Scope Withdrawal Time: 0 hours 24 minutes 34 seconds  Total Procedure Duration: 0 hours 36 minutes 58 seconds  Findings:      The perianal and digital rectal examinations were normal.  Multiple medium-mouthed diverticula were found in the sigmoid colon.      Three sessile polyps were found in the ascending colon. The polyps were       4 to 5 mm in size. These polyps were removed with a cold snare.       Resection and retrieval were complete.      Two sessile polyps were found in the transverse colon. The polyps were 5       to 6 mm in size. These polyps were removed with a cold snare. Resection       and retrieval were complete.      A 10 mm polyp was found in the transverse colon. The polyp was sessile.       Attempt was made to remove it via cold snare, but it would not cut       through, thus the polyp was removed with a hot snare. Resection and       retrieval were complete.      A 4 mm polyp was found in the descending colon.  The polyp was sessile.       The polyp was removed with a cold snare. Resection and retrieval were       complete.      The patient did not retain air well and coughed throughout the       procedure, prolonging the case. Multiple attempts at ileal intubation       were made but not possible due to these factors. The exam was otherwise       without abnormality. Impression:               - Diverticulosis in the sigmoid colon.                           - Three 4 to 5 mm polyps in the ascending colon,                            removed with a cold snare. Resected and retrieved.                           - Two 5 to 6 mm polyps in the transverse colon,                            removed with a cold snare. Resected and retrieved.                           - One 10 mm polyp in the transverse colon, removed                            with a hot snare. Resected and retrieved.                           - One 4 mm polyp in the descending colon, removed                            with a cold snare. Resected and retrieved.                           -  Poor air retension and patient coughing with                            anesthesia, which prolonged this procedure                           - The examination was otherwise normal.                           - No clear etiology for iron deficiency and GI                            bleeding on this exam, which I suspect is small                            bowel in etiology. Moderate Sedation:      No moderate sedation, case performed with MAC Recommendation:           - Return patient to hospital ward for ongoing care.                           - NPO for possible capsule endoscopy today (if no                            contraindications)                           - Continue present medications.                           - Await pathology results.                           - Repeat colonoscopy date to be determined after                            pending  pathology results are reviewed for                            surveillance. Procedure Code(s):        --- Professional ---                           [id number], Colonoscopy, flexible; with removal of                            tumor(s), polyp(s), or other lesion(s) by snare                            technique Diagnosis Code(s):        --- Professional ---                           D12.2, Benign neoplasm of ascending colon  D12.3, Benign neoplasm of transverse colon (hepatic                            flexure or splenic flexure)                           D12.4, Benign neoplasm of descending colon                           D50.9, Iron deficiency anemia, unspecified                           K57.30, Diverticulosis of large intestine without                            perforation or abscess without bleeding CPT copyright 2016 American Medical Association. All rights reserved. The codes documented in this report are preliminary and upon coder review may  be revised to meet current compliance requirements. Remo Lipps P. Hendrixx Severin, MD 05/28/2016 12:42:53 PM This report has been signed electronically. Number of Addenda: 0

## 2016-05-28 NOTE — Care Management Important Message (Signed)
Important Message  Patient Details  Name: Cassandra Bonilla MRN: [id number] Date of Birth: [birth year]   Medicare Important Message Given:  Yes    Dinesha Twiggs Montine Circle 05/28/2016, 11:42 AM

## 2016-05-28 NOTE — Evaluation (Signed)
Occupational Therapy Evaluation Patient Details Name: Cassandra Bonilla MRN: [id number] DOB: [birth year] Today's Date: 05/28/2016    History of Present Illness 75 y.o. female admitted to [facility] on 05/24/16 for nausea, decreased po intake and lethargy.  Found to have supratherapeutic INR and was given K+ and 2 units of plasma, 2 units of blood.  Pt underwent EGD with no significant results and GI and Internal medicine following.  Pt dx with acute encepholopathy.  Pt with significant PMHx of recent admission 8/5-8/18/17 for hypokalemia and acute on chronic heart failure.  She d/c to SNF for rehab and then to home.  Pt with other significant PMHx of HTN, gout, CAD, COPD, CKD III, and CABG.     Clinical Impression   Pt admitted with GI bleed. Pt currently with functional limitations due to the deficits listed below (see OT Problem List).  Pt will benefit from skilled OT to increase their safety and independence with ADL and functional mobility for ADL to facilitate discharge to venue listed below.      Follow Up Recommendations  SNF    Equipment Recommendations  None recommended by OT       Precautions / Restrictions Precautions Precautions: Fall Restrictions Weight Bearing Restrictions: No      Mobility Bed Mobility Overal bed mobility: Needs Assistance Bed Mobility: Supine to Sit;Rolling Rolling: Mod assist   Supine to sit: Min assist;HOB elevated Sit to supine: Mod assist      Transfers Overall transfer level: Needs assistance Equipment used: Rolling walker (2 wheeled) Transfers: Sit to/from [facility] Sit to Stand: Mod assist Stand pivot transfers: Mod assist       General transfer comment: Mod assist to support trunk during transitions to get to standing.     Balance                                            ADL Overall ADL's : Needs assistance/impaired     Grooming: Minimal assistance;Sitting   Upper Body Bathing: Minimal  assitance;Sitting   Lower Body Bathing: Maximal assistance;Sit to/from stand   Upper Body Dressing : Minimal assistance;Sitting   Lower Body Dressing: Maximal assistance;Sit to/from stand   Toilet Transfer: Moderate assistance;RW   Toileting- Clothing Manipulation and Hygiene: Maximal assistance;Sit to/from stand                         Pertinent Vitals/Pain Pain Assessment: No/denies pain     Hand Dominance Right   Extremity/Trunk Assessment             Communication Communication Communication: No difficulties   Cognition Arousal/Alertness: Awake/alert Behavior During Therapy: WFL for tasks assessed/performed Overall Cognitive Status: History of cognitive impairments - at baseline (per son, this is her baseline)                                [place] expects to be discharged to:: Private residence Living Arrangements: Spouse/significant other;Children Available Help at Discharge: Family;Available 24 hours/day Type of Home: House Home Access: Stairs to enter [employer] of Steps: 6 Entrance Stairs-Rails: Right;Left Home Layout: One level     Bathroom Shower/Tub: Teacher, early years/pre: Standard Bathroom Accessibility: Yes   Home Equipment: Environmental consultant - 2 wheels;Tub bench   Additional Comments: Active with  HHPT and RN      Prior Functioning/Environment Level of Independence: Needs assistance  Gait / Transfers Assistance Needed: Pt was walking with supervision and RW.           OT Diagnosis: Generalized weakness   OT Problem List: Decreased strength;Impaired balance (sitting and/or standing);Decreased activity tolerance;Decreased safety awareness;Decreased knowledge of precautions   OT Treatment/Interventions: Self-care/ADL training;Patient/family education;DME and/or AE instruction    OT Goals(Current goals can be found in the care plan section) Acute Rehab OT Goals Patient Stated Goal: to  keep her strength while here in the hospital  OT Frequency: Min 2X/week   Barriers to D/C:               End of Session Equipment Utilized During Treatment: Rolling walker;Gait belt Nurse Communication: Mobility status;Other (comment) (pt itching)  Activity Tolerance: Patient tolerated treatment well Patient left: in chair;with call bell/phone within reach   Time: [id number] OT Time Calculation (min): 19 min Charges:  OT General Charges $OT Visit: 1 Procedure OT Evaluation $OT Eval Moderate Complexity: 1 Procedure G-Codes:    Payton Mccallum D 06-10-16, 10:37 AM

## 2016-05-28 NOTE — Progress Notes (Signed)
Patient's BG is now 129 after administering 50 mL of 50% Dextrose. Will continue to monitor.

## 2016-05-28 NOTE — Interval H&P Note (Signed)
History and Physical Interval Note:  05/28/2016 11:02 AM  Cassandra Bonilla  has presented today for surgery, with the diagnosis of gi bleed, anemia.  The various methods of treatment have been discussed with the patient and family. After consideration of risks, benefits and other options for treatment, the patient has consented to  Procedure(s): COLONOSCOPY (N/A) as a surgical intervention .  The patient's history has been reviewed, patient examined, no change in status, stable for surgery.  I have reviewed the patient's chart and labs.  Questions were answered to the patient's satisfaction.     Renelda Loma Nithila Sumners

## 2016-05-28 NOTE — Progress Notes (Signed)
Physical Therapy Treatment Patient Details Name: Cassandra Bonilla MRN: [id number] DOB: 02/24/1941 Today's Date: 05/28/2016    History of Present Illness 75 y.o. female admitted to [facility] on 05/24/16 for nausea, decreased po intake and lethargy.  Found to have supratherapeutic INR and was given K+ and 2 units of plasma, 2 units of blood.  Pt underwent EGD with no significant results and GI and Internal medicine following.  Pt dx with acute encepholopathy.  Pt with significant PMHx of recent admission 8/5-8/18/17 for hypokalemia and acute on chronic heart failure.  She d/c to SNF for rehab and then to home.  Pt with other significant PMHx of HTN, gout, CAD, COPD, CKD III, and CABG.      PT Comments    Patient seen for mobility progression (dove tailed session with OT as patient to go OTF later this am. Patient tolerated in hall ambulation with min assist and 2 standing rest breaks. Cues for posture and positioning with use of RW.  OF NOTE: Patient reports excessive itching, nsg notified.  Follow Up Recommendations  Home health PT;Supervision/Assistance - 24 hour     Equipment Recommendations  None recommended by PT    Recommendations for Other Services Rehab consult     Precautions / Restrictions Precautions Precautions: Fall Restrictions Weight Bearing Restrictions: No    Mobility  Bed Mobility Overal bed mobility: Needs Assistance Bed Mobility: Supine to Sit;Rolling Rolling: Mod assist   Supine to sit: Min assist;HOB elevated Sit to supine: Mod assist      Transfers Overall transfer level: Needs assistance Equipment used: Rolling walker (2 wheeled) Transfers: Sit to/from [facility] Sit to Stand: Mod assist Stand pivot transfers: Mod assist       General transfer comment: Mod assist to support trunk during transitions to get to standing.   Ambulation/Gait Ambulation/Gait assistance: Min assist Ambulation Distance (Feet): 60 Feet Assistive device: Rolling  walker (2 wheeled) Gait Pattern/deviations: Step-through pattern;Decreased stride length;Shuffle;Drifts right/left;Trunk flexed Gait velocity: very slow Gait velocity interpretation: Below normal speed for age/gender General Gait Details: Pt side stepped at the side of the bed and used RW. She was leaning bil lower legs against the bed and using it for stability when moving, at times she would take hand off of RW cues to keep hands on RW in standing.  Pt did not feel up for attempting to walk due to fatigue.     Stairs            Wheelchair Mobility    Modified Rankin (Stroke Patients Only)       Balance   Sitting-balance support: Feet supported Sitting balance-Leahy Scale: Fair Sitting balance - Comments: it took some time at EOB until pt able to maintain balance without assist of the therapist   Standing balance support: Bilateral upper extremity supported Standing balance-Leahy Scale: Poor Standing balance comment: flexed posture with reliance on UE support                    Cognition Arousal/Alertness: Awake/alert Behavior During Therapy: WFL for tasks assessed/performed Overall Cognitive Status: History of cognitive impairments - at baseline (per son, this is her baseline)                      Exercises      General Comments        Pertinent Vitals/Pain Pain Assessment: No/denies pain    Home Living Family/patient expects to be discharged to:: Private residence Living Arrangements: Spouse/significant  other;Children Available Help at Discharge: Family;Available 24 hours/day Type of Home: House Home Access: Stairs to enter Entrance Stairs-Rails: Right;Left Home Layout: One level Home Equipment: Environmental consultant - 2 wheels;Tub bench Additional Comments: Active with HHPT and RN    Prior Function Level of Independence: Needs assistance  Gait / Transfers Assistance Needed: Pt was walking with supervision and RW.        PT Goals (current goals can  now be found in the care plan section) Acute Rehab PT Goals Patient Stated Goal: to keep her strength while here in the hospital PT Goal Formulation: With patient/family Time For Goal Achievement: 06/10/16 Potential to Achieve Goals: Good Progress towards PT goals: Progressing toward goals    Frequency  Min 3X/week    PT Plan Current plan remains appropriate    Co-evaluation             End of Session Equipment Utilized During Treatment: Gait belt Activity Tolerance: Patient limited by fatigue Patient left: in chair;with call bell/phone within reach     Time: 0940-0956 PT Time Calculation (min) (ACUTE ONLY): 16 min  Charges:  $Gait Training: 8-22 mins                    G CodesDuncan Dull 06-15-2016, 11:45 AM Alben Deeds, [place] DPT  [phone]

## 2016-05-28 NOTE — Transfer of Care (Signed)
Immediate Anesthesia Transfer of Care Note  Patient: Cassandra Bonilla  Procedure(s) Performed: Procedure(s): COLONOSCOPY (N/A)  Patient Location: Endoscopy Unit  Anesthesia Type:MAC  Level of Consciousness: sedated and responds to stimulation  Airway & Oxygen Therapy: Patient Spontanous Breathing and Patient connected to face mask oxygen  Post-op Assessment: Report given to RN, Post -op Vital signs reviewed and stable and Patient moving all extremities  Post vital signs: Reviewed and stable  Last Vitals:  Vitals:   05/28/16 0755 05/28/16 1049  BP: 140/69 (!) 146/38  Pulse: 87 84  Resp: 13 13  Temp: 37.1 C     Last Pain:  Vitals:   05/28/16 1049  TempSrc: Oral  PainSc:       Patients Stated Pain Goal: 0 ([id number])  Complications: No apparent anesthesia complications

## 2016-05-28 NOTE — Progress Notes (Signed)
[facility] Progress Note   Assessment/ Plan:   1. CKD stage IV/ V - f/b Dr Hollie Salk w [facility].  She continues to have a good urine output with downtrending BUN/creatinine noted on her most recent labs. She does not have any acute indications for intervention at this point and appears to be on her way towards recovery. Encouraged follow-up with Dr. Hollie Salk. 2.  Liver disease - unclear etiology of liver disease but manifest with coagulopathy and encephalopathy with elevated ammonia levels-ongoing treatment for hepatic encephalopathy with improvement of mental status. 3. GI bleed/acute blood loss anemia - EGD negative for any source of ongoing hemorrhage and colonoscopy done earlier today shows diverticulosis but otherwise no acute source of bleed. Suspected etiology of bleed thought to lay in the small bowel---now awaiting capsule endoscopy to evaluate her source of GI bleed. 4. Vol excess - restarted on furosemide after initially holding it for volume overload-will restart oral furosemide usual outpatient dose today from her previous intravenous dose.  5. HTN - blood pressures appear to be within acceptable limits, continue to monitor  6. Gout on allopruinol 7. 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  Hypokalemia - continue replacement fear oral/IV route to avoid aggravating hepatic encephalopathy/ammonia genesis  9. Hyponatremia: Continues to remain off metolazone-adjust ongoing diuretic therapy   Subjective:   Reports to be hungry at this time-had a colonoscopy done earlier today and awaiting her lunch.    Objective:   BP (!) 128/54   Pulse 96   Temp 98.8 F (37.1 C) (Oral)   Resp 17   Ht 5\' 2"  (1.575 m)   Wt 72.6 kg (160 lb 0.9 oz)   SpO2 96%   BMI 29.27 kg/m   Intake/Output Summary (Last 24 hours) at 05/28/16 1418 Last data filed at 05/28/16 1231  Gross per 24 hour  Intake              950 ml  Output             1263  ml  Net             -313 ml   Weight change:   Physical Exam: [id number] resting in bed, appears Somnolent CVS: Pulse regular rhythm, S1 and S2 with 3/6 ESM Resp: Clear to auscultation, no rales Abd: Soft, flat, nontender [id number] bilateral lower extremity edema with minimal upper extremity edema noted   Imaging: Dg Abd Portable 1v  Result Date: 05/27/2016 CLINICAL DATA:  Nasogastric tube placement. EXAM: PORTABLE ABDOMEN - 1 VIEW COMPARISON:  Lumbar spine dated 04/27/2016. FINDINGS: Nasogastric tube extending into the distal stomach and folded back against itself at the side hole with the tip in the mid stomach. Cholecystectomy clips. Normal bowel gas pattern. Arterial calcifications. Lower thoracic spine degenerative changes. IMPRESSION: Nasogastric tube folded back against itself in the distal stomach with its tip in the mid stomach. Electronically Signed   By: Claudie Revering M.D.   On: 05/27/2016 17:17    Labs: BMET  Recent Labs Lab 05/24/16 1853 05/25/16 0214 05/25/16 1659 05/26/16 0320 05/26/16 1830 05/27/16 0700 05/28/16 0313  NA 136 136 135 136 132* 133* 133*  K 3.4* 3.1* 2.8* 2.8* 3.6 3.4* 3.1*  CL 96* 96* 96* 95* 93* 93* 94*  CO2 25 26 25 27 24 26 27   GLUCOSE 103* 63* 127* 136* 290* 316* 79  BUN 129* 133* 124* 125* 117* 113* 98*  CREATININE 3.50* 3.52* 3.55*  3.48* 3.55* 3.42* 3.11*  CALCIUM 7.8* 7.7* 7.6* 7.5* 7.6* 7.7* 8.0*  PHOS 6.5*  --  5.9* 5.9*  --  5.0* 4.2   CBC  Recent Labs Lab 05/25/16 1700 05/26/16 0320 05/27/16 0700 05/28/16 0313  WBC 7.6 7.8 7.0 6.8  NEUTROABS 5.9 5.9  --   --   HGB 8.5* 8.2* 8.5* 8.6*  HCT 26.6* 25.8* 27.5* 28.2*  MCV 82.9 84.0 85.9 84.4  PLT 191 169 201 204    Medications:    . cefTRIAXone (ROCEPHIN)  IV  1 g Intravenous Q24H  . darbepoetin (ARANESP) injection - NON-DIALYSIS  60 mcg Subcutaneous Q Sun-1800  . furosemide  80 mg Intravenous Q12H  . insulin aspart  0-15 Units Subcutaneous Q4H  . lactulose  20  g Oral BID  . pantoprazole  40 mg Oral Q0600  . rifaximin  400 mg Oral TID   Elmarie Shiley, MD 05/28/2016, 2:18 PM

## 2016-05-28 NOTE — Progress Notes (Signed)
PROGRESS NOTE    Cassandra Bonilla  [id number] DOB: 20-Sep-1940 DOA: 05/24/2016 PCP: Marco Collie, MD   Brief Narrative:  75 year old BF PMHx Chronic Atrial fibrillation on Coumadin, Chronic Systolic CHF  with EF 0000000, Chronic anemia, CKD IV-V, HLD, DM Type 2, CAD native artery S/P CEA and CAD s/p 4v CABG 1993   Presented with melena and a hemoglobin of 6.2. Patient was recently hospitalized in [facility] between 8/5 and 8/18 for hypokalemia, acute on chronic systolic and right-sided heart failure, and discharged on Lasix 120 mg twice a day. Patient also had acute kidney injury, that slowly improved. Patient was also treated for hepatic encephalopathy and discharged on lactulose and rifaximin. 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 She underwent liver biopsy on 8/16, awaiting pathology and was supposed to follow-up with [facility]. Patient also has atrial fibrillation and was discharged on Lovenox and Coumadin.    Patient was discharged to SNF on 8/18 were she stayed about 8-10 days prior to being discharged home with husband. Patient supposedly had a supratherapeutic INR few days back. Husband noted that the patient developed melena about a week ago. Patient continued to get weaker, decreased by mouth intake, nauseous and more lethargic. Brought into ER at [facility] and she was found to have INR of 3.7. She was given vitamin K and 2 units of fresh frozen plasma and 1 unit of blood and transferred to [facility] for further evaluation. Hemoglobin upon presentation was 6.2. Patient was also found to have ammonia level of 107, and was given 300 mg per rectum of lactulose enema. Lactic acid was 8.1, troponin 0.10.  Patient transferred to [facility] for further evaluation   Subjective:    Assessment & Plan:   Principal Problem:   GIB (gastrointestinal bleeding) Active Problems:   Chronic atrial fibrillation (HCC)   Type 2 diabetes mellitus with stage 4 chronic kidney disease, with  long-term current use of insulin (HCC)   CKD (chronic kidney disease), stage IV (HCC)   Encephalopathy, hepatic (HCC)   GI bleed   Blood in stool   GIB - Acute hematochezia, melena  -anticoagulation reversed with vitamin & FF -9/12 Colonoscopy show multiple polyps -Capsule study in progress  Multiple colon polyps -All polyps removed sent for pathology.  Coagulopathy  -INR 3.7 at presentation - reversed with vitamin & FFP - in the setting of liver disease and spontaneous GI bleeding and does not appear that ongoing use of anticoagulation is safe in this patient  Cryptogenic Liver cirrhosis w/ Hepatic encephalopathy -Ceftriaxone for SBP prophylaxis - encephalopathy worsened by upper GI bleed contributing to severe uremia -Patient still extremely sleepy: Most likely combination of liver disease with anesthetic for colonoscopy. -Lactulose 20 g BID -Rifaximin 400 mg TID -Limited all sedating medication, if patient's mentation does not improve by 9/13 recommend placing NG tube.  Acute encephalopathy -Due to hepatic encephalopathy plus uremia - Negative MRI brain - Ammonia level corrected - BUN slowly drifting downward  CKD Stage 4/5 -Nephrology following   Chronic Afib -Rate controlled  - not safe for anticoag at this time   Acute on chronic systolic CHF  -EF [id number] via TTE 9/10, improved from 35% August 2017 -Strict in and out  - follow daily weights Filed Weights   05/24/16 0329 05/28/16 0500  Weight: 74.7 kg (164 lb 10.9 oz) 72.6 kg (160 lb 0.9 oz)   - Lasix dosing per Nephrology  - no ACEi/ARB for obvious reasons  -Start Metoprolol IV 2.5  mg TID -Lasix IV 80 mg BID  Severe Pulmonary Hypertension -See CHF  Hypokalemia -Potassium goal > 4 -Potassium IV 50 mEq   Hypomagnesemia -Magnesium goal > 2 -Magnesium IV Due to diuresis and poor intake plus GI losses - replace and follow  Elevated troponin -TTE noted improvement in ejection fraction from 35% > 45%  w/ no significant wall motion abnormality  DM Type 2 uncontrolled with complication -9/8 hemoglobin A1c= 7.4 -Moderate SSI  Gout Quiescent    DVT prophylaxis: SCD Code Status: Full  Family Communication: None Disposition Plan: Per GI   Consultants:  Dr Renelda Loma Armbruster. GI Nephrology    Procedures/Significant Events:  9/10 Echocardiogram:Left ventricle: mild LVH. -LVEF=  40% to 45%. -  Mitral valve:mild to moderate regurgitation. - Left atrium: moderately dilated. - Right atrium: mildly dilated. - Tricuspid valve: severe regurgitation. - Pulmonary arteries: PA peak pressure: 75 mm Hg (S). 9/12 Colonoscopy: -Diverticulosis in the sigmoid colon.  - Three 4 to 5 mm polyps in the ascending colo, -- Two 5 to 6 mm polyps in the transverse colon,  -  One 10 mm polyp in the transverse colon, - One 4 mm polyp in the descending colon, -- No clear etiology for iron deficiency and GI Bleeding on this exam, which I suspect is small bowel in etiology.    Cultures 9/12 all polyps removed biopsy results pending    Antimicrobials: Rocephin 9/7 >   Devices    LINES / TUBES:      Continuous Infusions:    Objective: Vitals:   05/28/16 1240 05/28/16 1245 05/28/16 1250 05/28/16 1418  BP: (!) 135/45 (!) 128/54  132/76  Pulse: 81 96 96 98  Resp: 20 (!) 21 17 19   Temp:    98.4 F (36.9 C)  TempSrc:    Oral  SpO2: 100% 98% 96% 98%  Weight:      Height:        Intake/Output Summary (Last 24 hours) at 05/28/16 1801 Last data filed at 05/28/16 1420  Gross per 24 hour  Intake              745 ml  Output             2313 ml  Net            -1568 ml   Filed Weights   05/24/16 0329 05/28/16 0500  Weight: 74.7 kg (164 lb 10.9 oz) 72.6 kg (160 lb 0.9 oz)    Examination:  General: Somnolent, difficult to arouse, falls immediately back to sleep, A/O 2 (does not know where, why) does know she is in the hospital, No acute respiratory distress Eyes: negative scleral  hemorrhage, negative anisocoria, negative icterus ENT: Negative Runny nose, negative gingival bleeding, Neck:  Negative scars, masses, torticollis, lymphadenopathy, JVD Lungs: Clear to auscultation bilaterally without wheezes or crackles Cardiovascular: Regular rate and rhythm without murmur gallop or rub normal S1 and S2 Abdomen: Morbidly obese, negative abdominal pain, nondistended, positive soft, bowel sounds, no rebound, no ascites, no appreciable mass Extremities: No significant cyanosis, clubbing, or edema bilateral lower extremities Skin: Negative rashes, lesions, ulcers Psychiatric:  Somnolent unable to complete exam  Central nervous system:  Somnolent unable to complete exam  .     Data Reviewed: Care during the described time interval was provided by me .  I have reviewed this patient's available data, including medical history, events of note, physical examination, and all test results as part of my evaluation. I have  personally reviewed and interpreted all radiology studies.  CBC:  Recent Labs Lab 05/25/16 0214 05/25/16 1700 05/26/16 0320 05/27/16 0700 05/28/16 0313  WBC 8.5 7.6 7.8 7.0 6.8  NEUTROABS  --  5.9 5.9  --   --   HGB 8.9* 8.5* 8.2* 8.5* 8.6*  HCT 27.6* 26.6* 25.8* 27.5* 28.2*  MCV 82.4 82.9 84.0 85.9 84.4  PLT 182 191 169 201 0000000   Basic Metabolic Panel:  Recent Labs Lab 05/24/16 1853  05/25/16 1659 05/26/16 0320 05/26/16 1830 05/27/16 0700 05/28/16 0313  NA 136  < > 135 136 132* 133* 133*  K 3.4*  < > 2.8* 2.8* 3.6 3.4* 3.1*  CL 96*  < > 96* 95* 93* 93* 94*  CO2 25  < > 25 27 24 26 27   GLUCOSE 103*  < > 127* 136* 290* 316* 79  BUN 129*  < > 124* 125* 117* 113* 98*  CREATININE 3.50*  < > 3.55* 3.48* 3.55* 3.42* 3.11*  CALCIUM 7.8*  < > 7.6* 7.5* 7.6* 7.7* 8.0*  MG 2.0  --   --  1.8  --   --  1.5*  PHOS 6.5*  --  5.9* 5.9*  --  5.0* 4.2  < > = values in this interval not displayed. GFR: Estimated Creatinine Clearance: 14.6 mL/min (by C-G  formula based on SCr of 3.11 mg/dL). Liver Function Tests:  Recent Labs Lab 05/24/16 1853 05/25/16 0214 05/25/16 1659 05/26/16 0320 05/27/16 0700 05/28/16 0313  AST 35 34  --  56*  --   --   ALT 21 21  --  30  --   --   ALKPHOS 200* 193*  --  169*  --   --   BILITOT 1.8* 1.6*  --  1.2  --   --   PROT 7.2 6.5  --  6.3*  --   --   ALBUMIN 2.8* 2.5* 2.7* 2.5* 2.7* 2.7*   No results for input(s): LIPASE, AMYLASE in the last 168 hours.  Recent Labs Lab 05/24/16 1853 05/26/16 2100  AMMONIA 41* 41*   Coagulation Profile:  Recent Labs Lab 05/24/16 0711 05/25/16 0214 05/26/16 0320  INR 2.78 2.26 1.53   Cardiac Enzymes:  Recent Labs Lab 05/24/16 0523 05/24/16 1853 05/24/16 2210 05/25/16 0214  TROPONINI 0.08* 0.11* 0.12* 0.12*   BNP (last 3 results) No results for input(s): PROBNP in the last 8760 hours. HbA1C: No results for input(s): HGBA1C in the last 72 hours. CBG:  Recent Labs Lab 05/28/16 0218 05/28/16 0349 05/28/16 0545 05/28/16 0754 05/28/16 1416  GLUCAP 87 79 129* 124* 161*   Lipid Profile: No results for input(s): CHOL, HDL, LDLCALC, TRIG, CHOLHDL, LDLDIRECT in the last 72 hours. Thyroid Function Tests: No results for input(s): TSH, T4TOTAL, FREET4, T3FREE, THYROIDAB in the last 72 hours. Anemia Panel:  Recent Labs  05/27/16 0700  TIBC 424  IRON 21*   Urine analysis:    Component Value Date/Time   COLORURINE YELLOW 05/25/2016 [place] 05/25/2016 1725   LABSPEC 1.014 05/25/2016 1725   PHURINE 6.0 05/25/2016 1725   GLUCOSEU NEGATIVE 05/25/2016 1725   HGBUR NEGATIVE 05/25/2016 1725   BILIRUBINUR NEGATIVE 05/25/2016 1725   KETONESUR NEGATIVE 05/25/2016 1725   PROTEINUR NEGATIVE 05/25/2016 1725   NITRITE NEGATIVE 05/25/2016 1725   LEUKOCYTESUR NEGATIVE 05/25/2016 1725   Sepsis Labs: @LABRCNTIP (procalcitonin:4,lacticidven:4)  ) Recent Results (from the past 240 hour(s))  MRSA PCR Screening     Status: None  Collection Time: 05/24/16  6:56 AM  Result Value Ref Range Status   MRSA by PCR NEGATIVE NEGATIVE Final    Comment:        The GeneXpert MRSA Assay (FDA approved for NASAL specimens only), is one component of a comprehensive MRSA colonization surveillance program. It is not intended to diagnose MRSA infection nor to guide or monitor treatment for MRSA infections.          Radiology Studies: Dg Abd Portable 1v  Result Date: 05/27/2016 CLINICAL DATA:  Nasogastric tube placement. EXAM: PORTABLE ABDOMEN - 1 VIEW COMPARISON:  Lumbar spine dated 04/27/2016. FINDINGS: Nasogastric tube extending into the distal stomach and folded back against itself at the side hole with the tip in the mid stomach. Cholecystectomy clips. Normal bowel gas pattern. Arterial calcifications. Lower thoracic spine degenerative changes. IMPRESSION: Nasogastric tube folded back against itself in the distal stomach with its tip in the mid stomach. Electronically Signed   By: Claudie Revering M.D.   On: 05/27/2016 17:17        Scheduled Meds: . cefTRIAXone (ROCEPHIN)  IV  1 g Intravenous Q24H  . darbepoetin (ARANESP) injection - NON-DIALYSIS  60 mcg Subcutaneous Q Sun-1800  . furosemide  80 mg Oral BID  . insulin aspart  0-15 Units Subcutaneous Q4H  . lactulose  20 g Oral BID  . pantoprazole  40 mg Oral Q0600  . potassium chloride  20 mEq Oral TID  . rifaximin  400 mg Oral TID   Continuous Infusions:    LOS: 4 days    Time spent: 40 minutes    Caitlyn Buchanan, Geraldo Docker, MD [facility] Pager [phone]   If 7PM-7AM, please contact night-coverage [email or web address] Password [email or web address] 05/28/2016, 6:01 PM

## 2016-05-28 NOTE — H&P (View-Only) (Signed)
Daily Rounding Note  05/27/2016, 8:41 AM  LOS: 3 days   SUBJECTIVE:   Chief complaint: dark, bloody stool.     Small amount of blood seen in a small stool this AM.  Pt denies abd pain, nausea.  She is hungry,  Current diet is full liquid.     OBJECTIVE:         Vital signs in last 24 hours:    Temp:  [97 F (36.1 C)-98.4 F (36.9 C)] 98.2 F (36.8 C) (09/11 0742) Pulse Rate:  [64-90] 66 (09/11 0800) Resp:  [11-24] 24 (09/11 0800) BP: (116-146)/(40-97) 143/59 (09/11 0800) SpO2:  [92 %-100 %] 97 % (09/11 0800) Last BM Date: 05/25/16 Filed Weights   05/24/16 0329  Weight: 74.7 kg (164 lb 10.9 oz)   General: pleasant, alert.  Looks chronically ill.    Heart: RRR.  + 3/6 murmer.   Chest: clear bil though BS on right diminished Abdomen: soft, NT, ND.  Active BS.    Extremities: Bil LE edema Neuro/Psych:  Alert, appropriated. Not oriented to year.  Knows "hospital" and self.  No asterixis.   Intake/Output from previous day: 09/10 0701 - 09/11 0700 In: 710 [P.O.:360; I.V.:300; IV Piggyback:50] Out: [id number] [Urine:1650]  Intake/Output this shift: Total I/O In: 50 [I.V.:50] Out: 500 [Urine:500]  Lab Results:  Recent Labs  05/25/16 1700 05/26/16 0320 05/27/16 0700  WBC 7.6 7.8 7.0  HGB 8.5* 8.2* 8.5*  HCT 26.6* 25.8* 27.5*  PLT 191 169 201   BMET  Recent Labs  05/26/16 0320 05/26/16 1830 05/27/16 0700  NA 136 132* 133*  K 2.8* 3.6 3.4*  CL 95* 93* 93*  CO2 27 24 26   GLUCOSE 136* 290* 316*  BUN 125* 117* 113*  CREATININE 3.48* 3.55* 3.42*  CALCIUM 7.5* 7.6* 7.7*   LFT  Recent Labs  05/24/16 1853 05/25/16 0214 05/25/16 1659 05/26/16 0320 05/27/16 0700  PROT 7.2 6.5  --  6.3*  --   ALBUMIN 2.8* 2.5* 2.7* 2.5* 2.7*  AST 35 34  --  56*  --   ALT 21 21  --  30  --   ALKPHOS 200* 193*  --  169*  --   BILITOT 1.8* 1.6*  --  1.2  --    PT/INR  Recent Labs  05/25/16 0214 05/26/16 0320    LABPROT 25.4* 18.6*  INR 2.26 1.53   Hepatitis Panel No results for input(s): HEPBSAG, HCVAB, HEPAIGM, HEPBIGM in the last 72 hours.  Studies/Results: Mr Brain Wo Contrast  Result Date: 05/25/2016 CLINICAL DATA:  Acute encephalopathy. EXAM: MRI HEAD WITHOUT CONTRAST TECHNIQUE: Multiplanar, multiecho pulse sequences of the brain and surrounding structures were obtained without intravenous contrast. COMPARISON:  Head CT from 2 days ago FINDINGS: Brain: No acute infarction, hemorrhage, hydrocephalus, extra-axial collection or mass lesion. Generalized cortical atrophy. 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 Mild confluent periventricular small vessel ischemic gliosis. Vascular: No evidence of major vessel occlusion Skull and upper cervical spine: Hypo intense appearance of the calvarium which is sclerotic on previous head CT, potentially related to patient's history of chronic kidney disease. No focal masslike marrow lesion. Sinuses/Orbits: Right mastoid effusion with fluid levels, also seen 04/29/2016 by head CT. No obstructive process seen at the nasopharynx. Left cataract resection. Other: None. IMPRESSION: 1. No acute finding. 2. Chronic microvascular disease and mild cortical atrophy. 3. Chronic right mastoid effusion. Electronically Signed  By: Monte Fantasia M.D.   On: 05/25/2016 18:49   Dg Chest Port 1 View  Result Date: 05/25/2016 CLINICAL DATA:  Central line placement EXAM: PORTABLE CHEST 1 VIEW COMPARISON:  05/24/2016 FINDINGS: Cardiomegaly again noted. Status post median sternotomy. Again noted central mild vascular congestion and mild perihilar interstitial prominence suspicious for mild pulmonary edema. Probable small left pleural effusion with left basilar atelectasis or infiltrate. Right IJ central line with tip in SVC. No pneumothorax. IMPRESSION: Right IJ line with tip in SVC. No pneumothorax. Cardiomegaly. Status post CABG.  Central vascular congestion and mild perihilar interstitial prominence suspicious for mild interstitial edema. Electronically Signed   By: Lahoma Crocker M.D.   On: 05/25/2016 15:59   Scheduled Meds: . cefTRIAXone (ROCEPHIN)  IV  1 g Intravenous Q24H  . darbepoetin (ARANESP) injection - NON-DIALYSIS  60 mcg Subcutaneous Q Sun-1800  . furosemide  120 mg Intravenous Q12H  . insulin aspart  0-9 Units Subcutaneous TID WC  . lactulose  20 g Oral BID  . rifaximin  400 mg Oral TID   Continuous Infusions: . sodium chloride    . pantoprozole (PROTONIX) infusion 8 mg/hr (05/26/16 2000)   PRN Meds:.acetaminophen **OR** acetaminophen, methocarbamol (ROBAXIN)  IV, ondansetron **OR** ondansetron (ZOFRAN) IV   ASSESMENT:   *  IDA.  Chronic blood loss. Longstanding.  Acute hematochezia, melena in setting of elevated INR (3.7 at [facility] PT transfer).   9/10 EGD: small HH.  Attempted colonoscopy 01/2016 aborted due to inability to prep.  S/p PRBC x 2.  Hgb stable.  Now started on Aranesp.    * Liver dz of unclear cause.  04/2016 liver biopsy: Hepatic sinusoidal dilatation with vague, focal noncaseating granulomatous inflammation.  Differential causes: CHF vs renal cause vs drugs.  *  Encephalopathy, acute on chronic. Hx elevated ammonia level.  On Lactulose, Rifaxamin at home.   *  Acute on chronic right heart failure.   *  A fib.  Coumadin on hold. S/p FFP x 2.   Coumadin previously discontinued 01/2016 due to GIB/anemia, restarted during 04/20/2016 admission.   *  CKD stage 4-5, AKI.    *  Florin (protein cal malnutrition).     PLAN   *  Stop PPI drip.  Switch to Protonix po once daily   *  Advance to solid, carb mod diet?    *  ? Pursue colonoscopy with bowel prep via NGT if necessary.   *  Wonder about benefit vs risk re Coumadin.    Azucena Freed  05/27/2016, 8:41 AM Pager: [phone]

## 2016-05-29 DIAGNOSIS — K7469 Other cirrhosis of liver: Secondary | ICD-10-CM

## 2016-05-29 DIAGNOSIS — G9340 Encephalopathy, unspecified: Secondary | ICD-10-CM

## 2016-05-29 DIAGNOSIS — I482 Chronic atrial fibrillation: Secondary | ICD-10-CM

## 2016-05-29 LAB — GLUCOSE, CAPILLARY
GLUCOSE-CAPILLARY: 125 mg/dL — AB (ref 65–99)
GLUCOSE-CAPILLARY: 184 mg/dL — AB (ref 65–99)
Glucose-Capillary: 232 mg/dL — ABNORMAL HIGH (ref 65–99)
Glucose-Capillary: 161 mg/dL — ABNORMAL HIGH (ref 65–99)

## 2016-05-29 LAB — RENAL FUNCTION PANEL
Albumin: 2.6 g/dL — ABNORMAL LOW (ref 3.5–5.0)
Anion gap: 12 (ref 5–15)
BUN: 90 mg/dL — ABNORMAL HIGH (ref 6–20)
CALCIUM: 8 mg/dL — AB (ref 8.9–10.3)
CHLORIDE: 94 mmol/L — AB (ref 101–111)
CO2: 27 mmol/L (ref 22–32)
CREATININE: 2.9 mg/dL — AB (ref 0.44–1.00)
GFR, EST AFRICAN AMERICAN: 17 mL/min — AB (ref >60)
GFR, EST NON AFRICAN AMERICAN: 15 mL/min — AB (ref >60)
Glucose, Bld: 116 mg/dL — ABNORMAL HIGH (ref 65–99)
Phosphorus: 3.8 mg/dL (ref 2.5–4.6)
Potassium: 3.3 mmol/L — ABNORMAL LOW (ref 3.5–5.1)
SODIUM: 133 mmol/L — AB (ref 135–145)

## 2016-05-29 LAB — HEMOGLOBIN AND HEMATOCRIT, BLOOD
HEMATOCRIT: 29.6 % — AB (ref 36.0–46.0)
Hemoglobin: 9.1 g/dL — ABNORMAL LOW (ref 12.0–15.0)

## 2016-05-29 LAB — PROTIME-INR
INR: 1.43
PROTHROMBIN TIME: 17.5 s — AB (ref 11.4–15.2)

## 2016-05-29 LAB — MAGNESIUM: MAGNESIUM: 1.9 mg/dL (ref 1.7–2.4)

## 2016-05-29 LAB — AMMONIA: AMMONIA: 23 umol/L (ref 9–35)

## 2016-05-29 MED ORDER — HYDROCODONE-ACETAMINOPHEN 5-325 MG PO TABS
1.0000 | ORAL_TABLET | Freq: Four times a day (QID) | ORAL | Status: DC | PRN
  Administered 2016-06-02 – 2016-06-03 (×4): 1 via ORAL
  Filled 2016-05-29 (×4): qty 1

## 2016-05-29 MED ORDER — ZOLPIDEM TARTRATE 5 MG PO TABS
5.0000 mg | ORAL_TABLET | Freq: Once | ORAL | Status: AC
  Administered 2016-05-29: 5 mg via ORAL
  Filled 2016-05-29: qty 1

## 2016-05-29 MED ORDER — LACTULOSE 10 GM/15ML PO SOLN
20.0000 g | Freq: Every day | ORAL | Status: DC
  Administered 2016-05-30: 20 g via ORAL
  Filled 2016-05-29: qty 30

## 2016-05-29 MED ORDER — DIPHENOXYLATE-ATROPINE 2.5-0.025 MG PO TABS
2.0000 | ORAL_TABLET | Freq: Once | ORAL | Status: AC
  Administered 2016-05-29: 2 via ORAL
  Filled 2016-05-29: qty 2

## 2016-05-29 MED ORDER — HYDROCODONE-ACETAMINOPHEN 5-325 MG PO TABS
1.0000 | ORAL_TABLET | Freq: Four times a day (QID) | ORAL | Status: DC | PRN
  Administered 2016-05-29: 1 via ORAL
  Filled 2016-05-29: qty 1

## 2016-05-29 MED ORDER — POTASSIUM CHLORIDE CRYS ER 20 MEQ PO TBCR
40.0000 meq | EXTENDED_RELEASE_TABLET | Freq: Once | ORAL | Status: AC
  Administered 2016-05-29: 40 meq via ORAL
  Filled 2016-05-29: qty 2

## 2016-05-29 MED ORDER — ACETAMINOPHEN 325 MG RE SUPP
325.0000 mg | Freq: Four times a day (QID) | RECTAL | Status: DC | PRN
Start: 2016-05-29 — End: 2016-06-04

## 2016-05-29 MED ORDER — ACETAMINOPHEN 500 MG PO TABS
500.0000 mg | ORAL_TABLET | Freq: Four times a day (QID) | ORAL | Status: DC | PRN
Start: 2016-05-29 — End: 2016-06-04
  Administered 2016-05-29 (×2): 500 mg via ORAL
  Filled 2016-05-29: qty 1

## 2016-05-29 NOTE — Progress Notes (Signed)
[facility] TEAM 1 - Stepdown/ICU TEAM  Cassandra Bonilla  [id number] DOB: [birth year] DOA: 05/24/2016 PCP: Marco Collie, MD    Brief Narrative:  75 yo w/ hx of A. fib on anticoagulation, type II DM, CKD, and cryptogenic liver cirrhosis who was admitted on 9/8 with confusion and fatigue.  She was found to have hepatic encephalopathy as well as a GI bleed.  Subjective: The patient is sitting up in bed eating.  She is much more alert and conversant than the last time that I saw her.  She denies chest pain shortness breath fevers chills nausea or vomiting.  She and her husband at bedside both agree that she is markedly improved.  She has not yet been up/attempting to ambulate.  Assessment & Plan:  GIB - Acute hematochezia, melena  anticoagulation reversed with vitamin & FFP - EGD suggested no acute bleeding - Colonoscopy noted diverticulosis but otherwise no acute source of bleed - awaiting results from capsule endoscopy - no evidence of further bleeding at this time   Coagulopathy  INR 3.7 at presentation - reversed with vitamin & FFP - in the setting of liver disease and spontaneous GI bleeding it does not appear that ongoing use of anticoagulation is safe in this patient  Cryptogenic Liver cirrhosis w/ Hepatic encephalopathy ceftriaxone for SBP prophylaxis - encephalopathy worsened by upper GI bleed contributing to severe uremia  Acute encephalopathy Due to hepatic encephalopathy plus uremia - Negative MRI brain - Ammonia level corrected - BUN slowly drifting downward with slowly improving mental status  Recent Labs Lab 05/26/16 0320 05/26/16 1830 05/27/16 0700 05/28/16 0313 05/29/16 0415  BUN 125* 117* 113* 98* 90*     CKD Stage 4/5 Nephrology Has evaluated - creatinine is slowly improving  Recent Labs Lab 05/26/16 0320 05/26/16 1830 05/27/16 0700 05/28/16 0313 05/29/16 0415  CREATININE 3.48* 3.55* 3.42* 3.11* 2.90*    Chronic Afib Rate controlled - not safe for anticoag  at this time   Acute on chronic systolic CHF  EF [id number] via TTE 9/10, improved from 35% August 2017 - follow daily weights - no ACEi/ARB for obvious reasons  Filed Weights   05/24/16 0329 05/28/16 0500 05/29/16 0500  Weight: 74.7 kg (164 lb 10.9 oz) 72.6 kg (160 lb 0.9 oz) 73.1 kg (161 lb 2.5 oz)    Hypokalemia Due to diuresis and poor intake plus GI losses - cont to replace and follow  Elevated troponin TTE noted improvement in ejection fraction from 35% > 45% w/ no significant wall motion abnormality  DM2 CBG reasonably controlled - follow without change  Gout Quiescent   DVT prophylaxis: SCDs Code Status: FULL CODE Family Communication: Spoke with husband at bedside Disposition Plan: Transfer to tele bed - follow renal function - PT/OT evaluation - await results of capsule endoscopy  Consultants:  GI Nephrology   Antimicrobials:  Rocephin 9/7 >  Objective: Blood pressure 121/83, pulse 95, temperature 98 F (36.7 C), temperature source Oral, resp. rate 13, height 5\' 2"  (1.575 m), weight 73.1 kg (161 lb 2.5 oz), SpO2 98 %.  Intake/Output Summary (Last 24 hours) at 05/29/16 1332 Last data filed at 05/29/16 1126  Gross per 24 hour  Intake              500 ml  Output             2950 ml  Net            -2450 ml   Filed Weights   05/24/16  0329 05/28/16 0500 05/29/16 0500  Weight: 74.7 kg (164 lb 10.9 oz) 72.6 kg (160 lb 0.9 oz) 73.1 kg (161 lb 2.5 oz)    Examination: General: No acute respiratory distress - More alert today Lungs: Clear to auscultation bilaterally - no wheezing Cardiovascular: Regular rate without gallop or rub Abdomen: Nontender, nondistended, soft, bowel sounds positive, no rebound, no ascites, no appreciable mass Extremities: No significant cyanosis, clubbing - trace edema bilateral lower extremities  CBC:  Recent Labs Lab 05/25/16 0214 05/25/16 1700 05/26/16 0320 05/27/16 0700 05/28/16 0313 05/29/16 0415  WBC 8.5 7.6 7.8 7.0 6.8  --    NEUTROABS  --  5.9 5.9  --   --   --   HGB 8.9* 8.5* 8.2* 8.5* 8.6* 9.1*  HCT 27.6* 26.6* 25.8* 27.5* 28.2* 29.6*  MCV 82.4 82.9 84.0 85.9 84.4  --   PLT 182 191 169 201 204  --    Basic Metabolic Panel:  Recent Labs Lab 05/24/16 1853  05/25/16 1659 05/26/16 0320 05/26/16 1830 05/27/16 0700 05/28/16 0313 05/29/16 0415  NA 136  < > 135 136 132* 133* 133* 133*  K 3.4*  < > 2.8* 2.8* 3.6 3.4* 3.1* 3.3*  CL 96*  < > 96* 95* 93* 93* 94* 94*  CO2 25  < > 25 27 24 26 27 27   GLUCOSE 103*  < > 127* 136* 290* 316* 79 116*  BUN 129*  < > 124* 125* 117* 113* 98* 90*  CREATININE 3.50*  < > 3.55* 3.48* 3.55* 3.42* 3.11* 2.90*  CALCIUM 7.8*  < > 7.6* 7.5* 7.6* 7.7* 8.0* 8.0*  MG 2.0  --   --  1.8  --   --  1.5* 1.9  PHOS 6.5*  --  5.9* 5.9*  --  5.0* 4.2 3.8  < > = values in this interval not displayed. GFR: Estimated Creatinine Clearance: 15.7 mL/min (by C-G formula based on SCr of 2.9 mg/dL (H)).  Liver Function Tests:  Recent Labs Lab 05/24/16 1853 05/25/16 0214 05/25/16 1659 05/26/16 0320 05/27/16 0700 05/28/16 0313 05/29/16 0415  AST 35 34  --  56*  --   --   --   ALT 21 21  --  30  --   --   --   ALKPHOS 200* 193*  --  169*  --   --   --   BILITOT 1.8* 1.6*  --  1.2  --   --   --   PROT 7.2 6.5  --  6.3*  --   --   --   ALBUMIN 2.8* 2.5* 2.7* 2.5* 2.7* 2.7* 2.6*    Recent Labs Lab 05/24/16 1853 05/26/16 2100 05/29/16 0415  AMMONIA 41* 41* 23    Coagulation Profile:  Recent Labs Lab 05/24/16 0711 05/25/16 0214 05/26/16 0320 05/29/16 0415  INR 2.78 2.26 1.53 1.43    Cardiac Enzymes:  Recent Labs Lab 05/24/16 0523 05/24/16 1853 05/24/16 2210 05/25/16 0214  TROPONINI 0.08* 0.11* 0.12* 0.12*    HbA1C: Hgb A1c MFr Bld  Date/Time Value Ref Range Status  05/24/2016 06:53 PM 7.4 (H) 4.8 - 5.6 % Final    Comment:    (NOTE)         Pre-diabetes: 5.7 - 6.4         Diabetes: >6.4         Glycemic control for adults with diabetes: <7.0   05/24/2016  05:23 AM 8.5 (H) 4.8 - 5.6 % Final  Comment:    (NOTE)         Pre-diabetes: 5.7 - 6.4         Diabetes: >6.4         Glycemic control for adults with diabetes: <7.0     CBG:  Recent Labs Lab 05/28/16 1416 05/28/16 1911 05/28/16 2315 05/29/16 0736 05/29/16 1117  GLUCAP 161* 163* 124* 125* 184*    Recent Results (from the past 240 hour(s))  MRSA PCR Screening     Status: None   Collection Time: 05/24/16  6:56 AM  Result Value Ref Range Status   MRSA by PCR NEGATIVE NEGATIVE Final    Comment:        The GeneXpert MRSA Assay (FDA approved for NASAL specimens only), is one component of a comprehensive MRSA colonization surveillance program. It is not intended to diagnose MRSA infection nor to guide or monitor treatment for MRSA infections.      Scheduled Meds: . cefTRIAXone (ROCEPHIN)  IV  1 g Intravenous Q24H  . darbepoetin (ARANESP) injection - NON-DIALYSIS  60 mcg Subcutaneous Q Sun-1800  . furosemide  80 mg Intravenous BID  . insulin aspart  0-15 Units Subcutaneous TID WC  . lactulose  20 g Oral BID  . metoprolol  2.5 mg Intravenous Q8H  . pantoprazole  40 mg Oral Q0600  . rifaximin  400 mg Oral TID     LOS: 5 days   Cherene Altes, MD [facility] Office  [phone] Pager - Text Page per Amion as per below:  On-Call/Text Page:      [email or web address]      password [email or web address]  If 7PM-7AM, please contact night-coverage [email or web address] Password [email or web address] 05/29/2016, 1:32 PM

## 2016-05-29 NOTE — Progress Notes (Signed)
Daily Rounding Note  05/29/2016, 8:23 AM  LOS: 5 days   SUBJECTIVE:   Chief complaint: bloody, dark stool    No bloody stool 9/12.  Feels well.  Capsule receiver in room, not yet downloaded.    OBJECTIVE:         Vital signs in last 24 hours:    Temp:  [98.2 F (36.8 C)-98.7 F (37.1 C)] 98.7 F (37.1 C) (09/13 0737) Pulse Rate:  [69-98] 84 (09/13 0737) Resp:  [12-21] 12 (09/13 0737) BP: (106-146)/(38-85) 126/67 (09/13 0737) SpO2:  [94 %-100 %] 94 % (09/13 0737) Weight:  [73.1 kg (161 lb 2.5 oz)] 73.1 kg (161 lb 2.5 oz) (09/13 0500) Last BM Date: 05/28/16 Filed Weights   05/24/16 0329 05/28/16 0500 05/29/16 0500  Weight: 74.7 kg (164 lb 10.9 oz) 72.6 kg (160 lb 0.9 oz) 73.1 kg (161 lb 2.5 oz)   General: pleasant, alert, frail, chronically ill looking   Heart: RRR.   Chest: clear bil.  No labored breathing Abdomen: soft, NT, ND.  Active BS  Extremities: bil LE edema Neuro/Psych:  Oriented to place, self, year.  No tremor or asterixis.  Moves all 4 limbs.   Intake/Output from previous day: 09/12 0701 - 09/13 0700 In: 800 [I.V.:300; IV Piggyback:500] Out: 2402 [Urine:2400; Blood:2]  Intake/Output this shift: Total I/O In: -  Out: 300 [Urine:300]  Lab Results:  Recent Labs  05/27/16 0700 05/28/16 0313 05/29/16 0415  WBC 7.0 6.8  --   HGB 8.5* 8.6* 9.1*  HCT 27.5* 28.2* 29.6*  PLT 201 204  --    BMET  Recent Labs  05/27/16 0700 05/28/16 0313 05/29/16 0415  NA 133* 133* 133*  K 3.4* 3.1* 3.3*  CL 93* 94* 94*  CO2 26 27 27   GLUCOSE 316* 79 116*  BUN 113* 98* 90*  CREATININE 3.42* 3.11* 2.90*  CALCIUM 7.7* 8.0* 8.0*   LFT  Recent Labs  05/27/16 0700 05/28/16 0313 05/29/16 0415  ALBUMIN 2.7* 2.7* 2.6*   PT/INR  Recent Labs  05/29/16 0415  LABPROT 17.5*  INR 1.43   Hepatitis Panel No results for input(s): HEPBSAG, HCVAB, HEPAIGM, HEPBIGM in the last 72  hours.  Studies/Results: Dg Abd Portable 1v  Result Date: 05/27/2016 CLINICAL DATA:  Nasogastric tube placement. EXAM: PORTABLE ABDOMEN - 1 VIEW COMPARISON:  Lumbar spine dated 04/27/2016. FINDINGS: Nasogastric tube extending into the distal stomach and folded back against itself at the side hole with the tip in the mid stomach. Cholecystectomy clips. Normal bowel gas pattern. Arterial calcifications. Lower thoracic spine degenerative changes. IMPRESSION: Nasogastric tube folded back against itself in the distal stomach with its tip in the mid stomach. Electronically Signed   By: Claudie Revering M.D.   On: 05/27/2016 17:17   Scheduled Meds: . cefTRIAXone (ROCEPHIN)  IV  1 g Intravenous Q24H  . darbepoetin (ARANESP) injection - NON-DIALYSIS  60 mcg Subcutaneous Q Sun-1800  . furosemide  80 mg Intravenous BID  . insulin aspart  0-15 Units Subcutaneous TID WC  . lactulose  20 g Oral BID  . metoprolol  2.5 mg Intravenous Q8H  . pantoprazole  40 mg Oral Q0600  . rifaximin  400 mg Oral TID   Continuous Infusions:  PRN Meds:.acetaminophen **OR** acetaminophen, HYDROcodone-acetaminophen, hydrOXYzine, ondansetron **OR** ondansetron (ZOFRAN) IV   ASSESMENT:   *  IDA.  Chronic blood loss. Longstanding.  Acute hematochezia, melena in setting of elevated INR (3.7 at [facility]  transfer).   9/10 EGD: small HH.  Attempted colonoscopy 01/2016 aborted due to inability to prep.  9/12 Colonoscopy: sigmoid tics, several polyps at transverse/descending removed.  Pathology pending.  9/12-13 Capsule endoscopy:  S/p PRBC x 2.  Hgb improved.  Now started on Aranesp.    * Liver dz of unclear cause.  04/2016 liver biopsy: Hepatic sinusoidal dilatation with vague, focal noncaseating granulomatous inflammation.  Differential causes: CHF vsrenal cause vs meds.  *  Encephalopathy, acute on chronic. Hx elevated ammonia level.  On Lactulose, Rifaxamin.  *  Acute on chronic right heart failure.   *  A fib.   Coumadin on hold. S/p FFP x 2.   Coumadin previously discontinued 01/2016 due to GIB/anemia, restarted 04/20/2016.  *  CKD stage 4-5, AKI.    *  [place] (protein cal malnutrition).      PLAN   *  Await reading of capsule study, should happen within 24 hours.  Wait polyp pathology.      Cassandra Bonilla  05/29/2016, 8:23 AM Pager: [phone]

## 2016-05-29 NOTE — Progress Notes (Signed)
Pt transferred to 2W11 via wheelchair, on monitor. axox4, son with pt, breathing even and unlabored, no s/s of distress noted.

## 2016-05-29 NOTE — Care Management Note (Addendum)
Case Management Note  Patient Details  Name: Cassandra Bonilla MRN: [id number] Date of Birth: 1941/08/24  Subjective/Objective:    Presents with GIB, AMS, lethargic, Patient was previously in [place] [facility] from last admission for 8-10 days then went home with spouse, she has a rolling walker, a cane, and w/chair and a bsc. She states she uses the w/chair mostly. Most likely will need pt eval.   9/11- per spouse, patient is active with [facility] for [facility],- checking pt/inr  And meds and HHPT, will like to add a HHaide.  She has a pcp, she has insurance to cover medications, and she will be transported by car at dc.  Spouse will like to continue with [facility]. Her EGD (-) 9/10, protonix iv change to po today per RN. On lactulose for liver failure. Spouse is very supportive of patient at home (he states he does a lot for her in regards to assistance).       9/13- NCM spoke with Butch Penny at [facility], confirms patient is active with them for [facility] and [facility] [place], she states the RN will have to come out to do assessment to see if they will be able to do an aide for them.  Also NCM will need to fax discharge summary to  [facility] at [phone], and they will need to be notified when patient is being discharged.                  Action/Plan:   Expected Discharge Date:                  Expected Discharge Plan:  [place]  In-House Referral:     Discharge planning Services  CM Consult  Post Acute Care Choice:  Resumption of Svcs/PTA Provider Choice offered to:  Spouse  DME Arranged:    DME Agency:     HH Arranged:  RN, PT, Nurse's Aide [place] Agency:  [facility]  Status of Service:     If discussed at [place] of Stay Meetings, dates discussed:    Additional Comments:  Zenon Mayo, RN 05/29/2016, 11:53 AM

## 2016-05-29 NOTE — Progress Notes (Signed)
[facility] Progress Note   Assessment/ Plan:   1. CKD stage IV - f/b Dr Hollie Salk @ [place].  Continues to maintain good urine output with improving creatinine off fluids. No uremic signs or symptoms to prompt intervention, volume status hypervolemic but on furosemide. Encouraged follow-up with Dr. Hollie Salk. 2.  Liver disease - unclear etiology of liver disease with labs showing coagulopathy and encephalopathy with elevated ammonia levels- treated successfully for hepatic encephalopathy with improvement of mental status. 3. GI bleed/acute blood loss anemia - EGD negative for any source of GI bleed and colonoscopy yesterday showed diverticulosis but otherwise no acute source of bleed. Awaiting results of capsule endoscopy to assess for etiology of bleeding in the small bowel. 4. Vol excess - restarted on oral furosemide yesterday after 2 days of IV lasix.  5. HTN - blood pressures appear to be within acceptable limits, continue to monitor  6. Gout on allopruinol 7. 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  Hypokalemia -  From ongoing diuretic/GI losses, replace via oral route 9.  Hyponatremia: Stable/unchanged, maintain treatment with furosemide and hold metolazone at this time.   We'll sign off at this time, please call with questions/concerns.  Subjective:   Reports to be feeling better this morning-had some blood in her stool overnight.    Objective:   BP 126/67 (BP Location: Right Arm)   Pulse 84   Temp 98.7 F (37.1 C) (Oral)   Resp 12   Ht 5\' 2"  (1.575 m)   Wt 73.1 kg (161 lb 2.5 oz)   SpO2 94%   BMI 29.48 kg/m   Intake/Output Summary (Last 24 hours) at 05/29/16 1119 Last data filed at 05/29/16 [id number]  Gross per 24 hour  Intake              800 ml  Output             2702 ml  Net            -1902 ml   Weight change: 0.5 kg (1 lb 1.6 oz)  Physical Exam: [id number] resting in recliner, watching  television CVS: Pulse regular rhythm, S1 and S2 with 3/6 ESM Resp: Clear to auscultation, no rales Abd: Soft, flat, nontender [id number] bilateral lower extremity edema with minimal upper extremity edema noted   Imaging: Dg Abd Portable 1v  Result Date: 05/27/2016 CLINICAL DATA:  Nasogastric tube placement. EXAM: PORTABLE ABDOMEN - 1 VIEW COMPARISON:  Lumbar spine dated 04/27/2016. FINDINGS: Nasogastric tube extending into the distal stomach and folded back against itself at the side hole with the tip in the mid stomach. Cholecystectomy clips. Normal bowel gas pattern. Arterial calcifications. Lower thoracic spine degenerative changes. IMPRESSION: Nasogastric tube folded back against itself in the distal stomach with its tip in the mid stomach. Electronically Signed   By: Claudie Revering M.D.   On: 05/27/2016 17:17    Labs: BMET  Recent Labs Lab 05/24/16 1853 05/25/16 0214 05/25/16 1659 05/26/16 0320 05/26/16 1830 05/27/16 0700 05/28/16 0313 05/29/16 0415  NA 136 136 135 136 132* 133* 133* 133*  K 3.4* 3.1* 2.8* 2.8* 3.6 3.4* 3.1* 3.3*  CL 96* 96* 96* 95* 93* 93* 94* 94*  CO2 25 26 25 27 24 26 27 27   GLUCOSE 103* 63* 127* 136* 290* 316* 79 116*  BUN 129* 133* 124* 125* 117* 113* 98* 90*  CREATININE 3.50* 3.52* 3.55* 3.48* 3.55* 3.42* 3.11* 2.90*  CALCIUM 7.8*  7.7* 7.6* 7.5* 7.6* 7.7* 8.0* 8.0*  PHOS 6.5*  --  5.9* 5.9*  --  5.0* 4.2 3.8   CBC  Recent Labs Lab 05/25/16 1700 05/26/16 0320 05/27/16 0700 05/28/16 0313 05/29/16 0415  WBC 7.6 7.8 7.0 6.8  --   NEUTROABS 5.9 5.9  --   --   --   HGB 8.5* 8.2* 8.5* 8.6* 9.1*  HCT 26.6* 25.8* 27.5* 28.2* 29.6*  MCV 82.9 84.0 85.9 84.4  --   PLT 191 169 201 204  --     Medications:    . cefTRIAXone (ROCEPHIN)  IV  1 g Intravenous Q24H  . darbepoetin (ARANESP) injection - NON-DIALYSIS  60 mcg Subcutaneous Q Sun-1800  . furosemide  80 mg Intravenous BID  . insulin aspart  0-15 Units Subcutaneous TID WC  . lactulose  20 g Oral  BID  . metoprolol  2.5 mg Intravenous Q8H  . pantoprazole  40 mg Oral Q0600  . rifaximin  400 mg Oral TID   Elmarie Shiley, MD 05/29/2016, 11:19 AM

## 2016-05-30 DIAGNOSIS — Z515 Encounter for palliative care: Secondary | ICD-10-CM

## 2016-05-30 DIAGNOSIS — Z7189 Other specified counseling: Secondary | ICD-10-CM

## 2016-05-30 DIAGNOSIS — L299 Pruritus, unspecified: Secondary | ICD-10-CM

## 2016-05-30 LAB — CBC
HEMATOCRIT: 31.4 % — AB (ref 36.0–46.0)
Hemoglobin: 9.6 g/dL — ABNORMAL LOW (ref 12.0–15.0)
MCH: 26.7 pg (ref 26.0–34.0)
MCHC: 30.6 g/dL (ref 30.0–36.0)
MCV: 87.2 fL (ref 78.0–100.0)
Platelets: 215 10*3/uL (ref 150–400)
RBC: 3.6 MIL/uL — AB (ref 3.87–5.11)
RDW: 18 % — AB (ref 11.5–15.5)
WBC: 6.2 10*3/uL (ref 4.0–10.5)

## 2016-05-30 LAB — COMPREHENSIVE METABOLIC PANEL
ALT: 20 U/L (ref 14–54)
AST: 34 U/L (ref 15–41)
Albumin: 2.7 g/dL — ABNORMAL LOW (ref 3.5–5.0)
Alkaline Phosphatase: 160 U/L — ABNORMAL HIGH (ref 38–126)
Anion gap: 12 (ref 5–15)
BILIRUBIN TOTAL: 0.7 mg/dL (ref 0.3–1.2)
BUN: 91 mg/dL — AB (ref 6–20)
CO2: 27 mmol/L (ref 22–32)
CREATININE: 2.86 mg/dL — AB (ref 0.44–1.00)
Calcium: 8.2 mg/dL — ABNORMAL LOW (ref 8.9–10.3)
Chloride: 97 mmol/L — ABNORMAL LOW (ref 101–111)
GFR calc Af Amer: 17 mL/min — ABNORMAL LOW (ref >60)
GFR, EST NON AFRICAN AMERICAN: 15 mL/min — AB (ref >60)
Glucose, Bld: 149 mg/dL — ABNORMAL HIGH (ref 65–99)
Potassium: 3.3 mmol/L — ABNORMAL LOW (ref 3.5–5.1)
Sodium: 136 mmol/L (ref 135–145)
TOTAL PROTEIN: 6.5 g/dL (ref 6.5–8.1)

## 2016-05-30 LAB — GLUCOSE, CAPILLARY
Glucose-Capillary: 166 mg/dL — ABNORMAL HIGH (ref 65–99)
Glucose-Capillary: 125 mg/dL — ABNORMAL HIGH (ref 65–99)
Glucose-Capillary: 244 mg/dL — ABNORMAL HIGH (ref 65–99)
Glucose-Capillary: 207 mg/dL — ABNORMAL HIGH (ref 65–99)

## 2016-05-30 LAB — MAGNESIUM: Magnesium: 1.7 mg/dL (ref 1.7–2.4)

## 2016-05-30 LAB — AMMONIA: Ammonia: 56 umol/L — ABNORMAL HIGH (ref 9–35)

## 2016-05-30 LAB — PROTIME-INR
INR: 1.3
PROTHROMBIN TIME: 16.3 s — AB (ref 11.4–15.2)

## 2016-05-30 MED ORDER — METOPROLOL SUCCINATE ER 25 MG PO TB24
25.0000 mg | ORAL_TABLET | Freq: Every day | ORAL | Status: DC
  Administered 2016-05-31 – 2016-06-04 (×5): 25 mg via ORAL
  Filled 2016-05-30 (×5): qty 1

## 2016-05-30 MED ORDER — LACTULOSE 10 GM/15ML PO SOLN
20.0000 g | Freq: Two times a day (BID) | ORAL | Status: DC
  Administered 2016-05-31 – 2016-06-01 (×3): 20 g via ORAL
  Filled 2016-05-30 (×4): qty 30

## 2016-05-30 MED ORDER — CHOLESTYRAMINE 4 G PO PACK
4.0000 g | PACK | Freq: Two times a day (BID) | ORAL | Status: DC
  Administered 2016-05-30 – 2016-05-31 (×3): 4 g via ORAL
  Filled 2016-05-30 (×3): qty 1

## 2016-05-30 MED ORDER — CAMPHOR-MENTHOL 0.5-0.5 % EX LOTN
TOPICAL_LOTION | Freq: Two times a day (BID) | CUTANEOUS | Status: DC
  Administered 2016-05-30 – 2016-05-31 (×3): via TOPICAL
  Administered 2016-06-01 (×2): 1 via TOPICAL
  Administered 2016-06-02 – 2016-06-04 (×5): via TOPICAL
  Filled 2016-05-30: qty 222

## 2016-05-30 MED ORDER — FUROSEMIDE 80 MG PO TABS
80.0000 mg | ORAL_TABLET | Freq: Two times a day (BID) | ORAL | Status: DC
  Administered 2016-05-30 – 2016-06-01 (×4): 80 mg via ORAL
  Filled 2016-05-30 (×4): qty 1

## 2016-05-30 NOTE — Consult Note (Signed)
Consultation Note Date: 05/30/2016   Patient Name: Cassandra Bonilla  DOB: 01-19-1941  MRN: [id number]  Age / Sex: 75 y.o., female  PCP: Marco Collie, MD Referring Physician: Allie Bossier, MD  Reason for Consultation: Establishing goals of care and Hospice Evaluation  HPI/Patient Profile: 75 y.o. female  with past medical history of CAD, COPD with pulmonary hypertension, CKD, systolic CHF, Cirrhosis admitted on 05/24/2016 with melena, acute encephalopathy, and acute on chronic renal insufficiency. Recent hospitalization for hypokalemia and acute on chronic systolic and right-sided heart failure. Discharged to rehab and then home with husband after completion of rehab. On admission, patient given vitamin K, 2 units FFP, and 1 unit PRBC. GI consulted. Patient had EGD and colonoscopy-multiple polyps sent for pathology. Capsule study normal. Recommending holding coumadin and outpatient f/u in 2-3 weeks. Chronic kidney disease-nephrology has signed off and also recommending outpatient f/u in 1-2 weeks. Also diagnosed with liver cirrhosis with hepatic encephalopathy. Negative MRI. Receiving lactulose and rifaximin. Acute on chronic CHF with EF 40-45%. Palliative medicine consultation for goals of care-patient with multisystem organ failure.   Clinical Assessment and Goals of Care: Cassandra Bonilla, [place] and I met with Cassandra Bonilla and her husband of 41 years, [name]. They are both very spiritual individuals and [name] is a recently retired Theme park manager. He was a Veterinary surgeon in [place] for 17 years. Cassandra Bonilla tells [name] she is a miracle because her mother had a hysterectomy when young and should have never been able to have children, but became pregnant with her. Cassandra Bonilla believes it is a miracle she is still alive because she felt like she was dying when she came back to the hospital this time.   Asked the patient and husband if they  have discussed her wishes regarding resuscitation and life support. She state they talk about everything but did not emphasize where she stands with these decisions. Rae Mar an advanced directive packet as well as copy of Hard Choices. He speaks of the importance of talking about these decisions with her now so he and the family understand her wishes at the end of life.    [name] seems to understand the worsening condition of her heart, kidneys, and liver. He states he has "dealt with this a lot but hard when you are on the other side." [name] tells [name] that she will not be able to go back to Rehab because of insurance. Discussed possibility of home hospice services and the support they could offer to Cassandra Bonilla. [name] would like more time to think and discuss advanced directives and hospice with patient and family.   NEXT OF KIN-husband, Charles    SUMMARY OF RECOMMENDATIONS    Continue discussions with patient and family regarding code status and goals of care/hospice. Gave advanced directive packet and Hard Choices copy to husband. Patient is still a FULL code.   C/o generalized pruritus--see symptom management.  Disposition-patient would qualify for home with hospice services.   PMT will continue to follow patient during hospitalization and meet  with husband, [name] again on 9/15.   Code Status/Advance Care Planning:  Full code   Symptom Management:   Generalized pruritus most likely due to liver cirrhosis. Ordered Sarna topical BID and Questran packet 4g PO BID.   Palliative Prophylaxis:   Aspiration and Delirium Protocol  Additional Recommendations (Limitations, Scope, Preferences):  Full Scope Treatment  Psycho-social/Spiritual:   Desire for further Chaplaincy support:no  Additional Recommendations: Caregiving  Support/Resources and Education on Hospice  Prognosis:   Unable to determine-acute on chronic CHF, CKD, and liver cirrhosis with recurrent hepatic  encephalopathy.   Discharge Planning: To Be Determined -home health vs. Home with hospice services     Primary Diagnoses: Present on Admission: . Chronic atrial fibrillation ([place]) . CKD (chronic kidney disease), stage IV ([place]) . GIB (gastrointestinal bleeding) . Encephalopathy, hepatic ([place]) . GI bleed   I have reviewed the medical record, interviewed the patient and family, and examined the patient. The following aspects are pertinent.  Past Medical History:  Diagnosis Date  . CHF (congestive heart failure) ([place])   . CKD (chronic kidney disease) stage 3, GFR 30-59 ml/min   . COPD (chronic obstructive pulmonary disease) ([place])   . Coronary artery disease   . Diabetes mellitus without complication ([place])   . Gout   . Hypertension   . Hypokalemia 03/21/2016   Social History   Social History  . Marital status: Married    Spouse name: N/A  . Number of children: N/A  . Years of education: N/A   Social History Main Topics  . Smoking status: Former Research scientist (life sciences)  . Smokeless tobacco: Never Used     Comment: quit smoking  in 1993  . Alcohol use No  . Drug use: No  . Sexual activity: Not Asked   Other Topics Concern  . None   Social History Narrative  . None   Family History  Problem Relation Age of Onset  . Breast cancer Mother    Scheduled Meds: . camphor-menthol   Topical BID  . cholestyramine  4 g Oral BID  . darbepoetin (ARANESP) injection - NON-DIALYSIS  60 mcg Subcutaneous Q Sun-1800  . furosemide  80 mg Oral BID  . insulin aspart  0-15 Units Subcutaneous TID WC  . lactulose  20 g Oral BID  . metoprolol succinate  25 mg Oral Daily  . pantoprazole  40 mg Oral Q0600  . rifaximin  400 mg Oral TID   Continuous Infusions:  PRN Meds:.acetaminophen **OR** acetaminophen, HYDROcodone-acetaminophen, hydrOXYzine, ondansetron **OR** ondansetron (ZOFRAN) IV Medications Prior to Admission:  Prior to Admission medications   Medication Sig Start Date End Date Taking?  Authorizing Provider  acetaminophen (TYLENOL) 650 MG CR tablet Take 975 mg by mouth at bedtime as needed for pain.    Yes Historical Provider, MD  allopurinol (ZYLOPRIM) 100 MG tablet Take 100 mg by mouth daily.   Yes Historical Provider, MD  amLODipine (NORVASC) 10 MG tablet Take 10 mg by mouth daily.   Yes Historical Provider, MD  aspirin EC 81 MG tablet Take 81 mg by mouth daily.   Yes Historical Provider, MD  atorvastatin (LIPITOR) 40 MG tablet Take 40 mg by mouth daily.   Yes Historical Provider, MD  furosemide (LASIX) 40 MG tablet Take 3 tablets (120 mg total) by mouth 2 (two) times daily. 05/03/16  Yes Kelvin Cellar, MD  guaiFENesin-codeine [facility]) 100-10 MG/5ML syrup Take 10 mLs by mouth 3 (three) times daily as needed for cough.   Yes Historical Provider,  MD  hydrOXYzine (ATARAX/VISTARIL) 25 MG tablet Take 1-2 tablets (25-50 mg total) by mouth every 6 (six) hours as needed for itching. Patient taking differently: Take 25-50 mg by mouth See admin instructions. Take 2 tablets (50 mg) by mouth daily at bedtime, may also take 1 tablet (25 mg) 3 times during the day as needed for itching 03/24/16  Yes Hosie Poisson, MD  insulin glargine (LANTUS) 100 UNIT/ML injection Inject 10 Units into the skin every morning.    Yes Historical Provider, MD  insulin lispro (HUMALOG) 100 UNIT/ML injection Inject 2-15 Units into the skin 3 (three) times daily after meals. 2-8 units if CBG <400, takes 15 units if CBG >400 (per sliding scale)   Yes Historical Provider, MD  lactulose (CHRONULAC) 10 GM/15ML solution Take 30 mLs (20 g total) by mouth 2 (two) times daily. Patient taking differently: Take 20 g by mouth 2 (two) times daily as needed for mild constipation.  05/03/16  Yes Kelvin Cellar, MD  metolazone (ZAROXOLYN) 5 MG tablet Take 5 mg by mouth daily.    Yes Historical Provider, MD  metoprolol succinate (TOPROL-XL) 50 MG 24 hr tablet Take 1 tablet (50 mg total) by mouth daily. Take with or immediately  following a meal. 05/03/16  Yes Kelvin Cellar, MD  pantoprazole (PROTONIX) 40 MG tablet Take 40 mg by mouth daily.    Yes Historical Provider, MD  potassium chloride SA (K-DUR,KLOR-CON) 20 MEQ tablet Take 2 tablets (40 mEq total) by mouth daily. 05/03/16  Yes Kelvin Cellar, MD  rifaximin (XIFAXAN) 200 MG tablet Take 2 tablets (400 mg total) by mouth 3 (three) times daily. Patient taking differently: Take 400 mg by mouth 2 (two) times daily.  05/03/16  Yes Kelvin Cellar, MD  warfarin (COUMADIN) 2 MG tablet Take 3 mg by mouth every evening.   Yes Historical Provider, MD   Allergies  Allergen Reactions  . Contrast Media [Iodinated Diagnostic Agents] Hives  . Prednisone Other (See Comments)    Confusion  . Lactose Intolerance (Gi) Diarrhea    There are times when/if the patient consumes milk products, it results in diarrhea   Review of Systems  Constitutional: Positive for activity change and fatigue.  Skin:       C/o itching  All other systems reviewed and are negative.   Physical Exam  Constitutional: She appears well-developed. She is cooperative.  Cardiovascular: An irregularly irregular rhythm present.  afib-rate controlled  Pulmonary/Chest: Effort normal. She has decreased breath sounds.  Abdominal: Soft. Bowel sounds are normal. She exhibits no distension. There is no tenderness.  Neurological: She is alert.  Oriented to person and place. Forgetful.  Skin: Skin is dry.  Psychiatric: She has a normal mood and affect. Her speech is normal and behavior is normal.  Nursing note and vitals reviewed.   Vital Signs: BP 136/90 (BP Location: Right Arm)   Pulse 92   Temp 98.1 F (36.7 C) (Oral)   Resp 18   Ht _0  (1.575 m)   Wt 69.9 kg (154 lb)   SpO2 100%   BMI 28.17 kg/m  Pain Assessment: No/denies pain POSS *See Group Information*: 1-Acceptable,Awake and alert Pain Score: 0-No pain   SpO2: SpO2: 100 % O2 Device:SpO2: 100 % O2 Flow Rate: .O2 Flow Rate (L/min): 2  L/min  IO: Intake/output summary:  Intake/Output Summary (Last 24 hours) at 05/30/16 1618 Last data filed at 05/30/16 1500  Gross per 24 hour  Intake  0 ml  Output             1400 ml  Net            -1400 ml    LBM: Last BM Date: 05/29/16 Baseline Weight: Weight: 74.7 kg (164 lb 10.9 oz) Most recent weight: Weight: 69.9 kg (154 lb)     Palliative Assessment/Data:   Flowsheet Rows   Flowsheet Row Most Recent Value  Intake Tab  Referral Department  Hospitalist  Unit at Time of Referral  Med/Surg Unit  Palliative Care Primary Diagnosis  Trauma  Date Notified  05/30/16  Palliative Care Type  New Palliative care  Reason for referral  Clarify Goals of Care  Date of Admission  05/24/16  Date first seen by Palliative Care  05/30/16  # of days Palliative referral response time  0 Day(s)  # of days IP prior to Palliative referral  6  Clinical Assessment  Palliative Performance Scale Score  40%  Psychosocial & Spiritual Assessment  Palliative Care Outcomes  Patient/Family meeting held?  Yes  Who was at the meeting?  patient and husband  Palliative Care Outcomes  Counseled regarding hospice, Clarified goals of care, Improved non-pain symptom therapy      Time In: 1530 Time Out: 1650 Time Total: 19mn Greater than 50%  of this time was spent counseling and coordinating care related to the above assessment and plan.  Signed by:  MImogene Burn PA-C MIhor Dow FNP-C Palliative Medicine Team  Phone: [phone]: [phone]  Please contact Palliative Medicine Team phone at [phone] questions and concerns.  For individual provider: See AShea Evans

## 2016-05-30 NOTE — Progress Notes (Addendum)
      Progress Note   Subjective  Stable Hgb overnight, patient without further symptoms of bleeding. Capsule study performed.   Objective   Vital signs in last 24 hours: Temp:  [97.5 F (36.4 C)-98 F (36.7 C)] 97.7 F (36.5 C) (09/14 0600) Pulse Rate:  [75-98] 98 (09/14 0600) Resp:  [13-16] 16 (09/13 1449) BP: (107-144)/(51-83) 144/71 (09/14 0600) SpO2:  [97 %-100 %] 97 % (09/14 0600) Weight:  [154 lb (69.9 kg)] 154 lb (69.9 kg) (09/14 0521) Last BM Date: 05/28/16 General:    AA female in NAD Heart:  Regular rate and rhythm;  Lungs: Respirations even and unlabored, anteriorly Abdomen:  Soft, nontender and nondistended.  Extremities:  Without edema. Psych:  Cooperative, sleeping  Intake/Output from previous day: 09/13 0701 - 09/14 0700 In: -  Out: 1450 [Urine:1450] Intake/Output this shift: No intake/output data recorded.  Lab Results:  Recent Labs  05/28/16 0313 05/29/16 0415  WBC 6.8  --   HGB 8.6* 9.1*  HCT 28.2* 29.6*  PLT 204  --    BMET  Recent Labs  05/28/16 0313 05/29/16 0415  NA 133* 133*  K 3.1* 3.3*  CL 94* 94*  CO2 27 27  GLUCOSE 79 116*  BUN 98* 90*  CREATININE 3.11* 2.90*  CALCIUM 8.0* 8.0*   LFT  Recent Labs  05/29/16 0415  ALBUMIN 2.6*   PT/INR  Recent Labs  05/29/16 0415  LABPROT 17.5*  INR 1.43    Studies/Results: No results found.     Assessment / Plan:   75 y/o female with multiple medical problems including liver disease, atrial fibrillation on coumadin, who presented with recurrent GI bleeding and anemia. EGD with small HH, no pathology noted. Colonoscopy with multiple adenomas, diverticulosis, but no cause for anemia. Capsule study done yesterday, initial report shows this is a normal study without obvious pathology in the small bowel noted to cause her anemia. Her Hgb has been stable without recurrence of bleeding since stopping coumadin.  Overall, unclear etiology given workup to date, but suspect small  bowel in etiology (AVMs perhaps not seen on capsule?). Given she has had recurrent bleeding with 2 hospitalizations in recent months while on coumadin, would recommend holding off on coumadin for now. I would consider aspirin therapy moving forward, she can discuss this further with her Cardiologist, but would ensure Hgb is stable for the next week or so prior to considering this.   From a GI standpoint she is stable for discharge. She can follow up in our office for reassessment in the upcoming weeks for this issue and for follow up for her liver disease. I believe she would be following with Dr. Carlean Purl who performed her initial consulation. Of note, if she presents in the future with overt bleeding, a tagged RBC scan would be useful initially to help localize the source.  Of note, pathology results showed multiple adenomas which were removed during colonsocopy. Given her comorbidities and age she likely would not benefit from further surveillance colonoscopy, however can reassess this issue in 3 years.    Please call with questions, will sign off for now.   Brookings Cellar, MD [facility] Pager [phone]

## 2016-05-30 NOTE — Progress Notes (Signed)
Physical Therapy Treatment Patient Details Name: Cassandra Bonilla MRN: [id number] DOB: [birth year] Today's Date: 05/30/2016    History of Present Illness 75 y.o. female admitted to [facility] on 05/24/16 for nausea, decreased po intake and lethargy.  Found to have supratherapeutic INR and was given K+ and 2 units of plasma, 2 units of blood.  Pt underwent EGD with no significant results and GI and Internal medicine following.  Pt dx with acute encepholopathy.  Pt with significant PMHx of recent admission 8/5-8/18/17 for hypokalemia and acute on chronic heart failure.  She d/c to SNF for rehab and then to home.  Pt with other significant PMHx of HTN, gout, CAD, COPD, CKD III, and CABG.      PT Comments    Pt very confused and weak. Unable to amb today. Feel pt will need ST-SNF prior to return home.  Follow Up Recommendations  SNF     Equipment Recommendations  None recommended by PT    Recommendations for Other Services       Precautions / Restrictions Precautions Precautions: Fall Restrictions Weight Bearing Restrictions: No    Mobility  Bed Mobility Overal bed mobility: Needs Assistance Bed Mobility: Supine to Sit;Rolling;Sit to Supine Rolling: Min assist   Supine to sit: HOB elevated;Max assist Sit to supine: Max assist   General bed mobility comments: Pt with great difficulty staying on task. Required constant verbal/tactile cues.  Transfers Overall transfer level: Needs assistance Equipment used: Rolling walker (2 wheeled) Transfers: Sit to/from Stand Sit to Stand: Mod assist;Max assist         General transfer comment: Assist to bring hips and trunk up. Pt with heavy posterior lean against bed. 1st attempt required mod A and second attempt required max assist.  Ambulation/Gait             General Gait Details: Unable to attempt due to posterior lean and pt feeling light headed   [employer] (Stroke Patients  Only)       Balance Overall balance assessment: Needs assistance Sitting-balance support: Feet supported;Bilateral upper extremity supported Sitting balance-Leahy Scale: Poor Sitting balance - Comments: UE assist   Standing balance support: Bilateral upper extremity supported Standing balance-Leahy Scale: Poor Standing balance comment: Mod Assist and walker for static standing                    Cognition Arousal/Alertness: Awake/alert Behavior During Therapy: WFL for tasks assessed/performed Overall Cognitive Status: Impaired/Different from baseline Area of Impairment: Memory;Attention;Following commands;Safety/judgement;Problem solving   Current Attention Level: Sustained Memory: Decreased recall of precautions;Decreased short-term memory Following Commands: Follows one step commands inconsistently;Follows one step commands with increased time Safety/Judgement: Decreased awareness of safety;Decreased awareness of deficits   Problem Solving: Slow processing;Decreased initiation;Requires verbal cues;Requires tactile cues;Difficulty sequencing      Exercises      General Comments        Pertinent Vitals/Pain Pain Assessment: No/denies pain    Home Living                      Prior Function            PT Goals (current goals can now be found in the care plan section) Progress towards PT goals: Not progressing toward goals - comment    Frequency  Min 3X/week    PT Plan Discharge plan needs to be updated  Co-evaluation             End of Session Equipment Utilized During Treatment: Gait belt Activity Tolerance: Patient limited by fatigue Patient left: with call bell/phone within reach;in bed;with bed alarm set;with family/visitor present     Time: 1130-1205 PT Time Calculation (min) (ACUTE ONLY): 35 min  Charges:  $Therapeutic Activity: 23-37 mins                    G Codes:      Rakisha Pincock 2016-06-06, 12:46 PM [employer]  PT [phone]

## 2016-05-30 NOTE — Consult Note (Signed)
   [facility] CM Inpatient Consult   05/30/2016  Elliona Duffer January 02, 1941 [id number]    Patient has been screened for [facility] services.  Patient has [facility].  Chart review reveals the patient is a 75 y.o. female admitted to [facility] on 05/24/16 for nausea, decreased po intake and lethargy.  Found to have supratherapeutic INR and was given K+ and 2 units of plasma, 2 units of blood.  Pt underwent EGD with no significant results and GI and Internal medicine following.  Pt dx with acute encepholopathy.  Pt with significant PMHx of recent admission 8/5-8/18/17 for hypokalemia and acute on chronic heart failure.  She d/c to SNF for rehab and then to home.  Pt with other significant PMHx of HTN, gout, CAD, COPD, CKD III, and CABG.   This is the patient's 3 hospital stay in the past 6 months. Went by to see the patient who did not wake up and no family was in the room. Will continue to follow for progress and needs.   For questions, please contact:  Natividad Brood, RN BSN [facility] Liaison  [phone] business mobile phone Toll free office [phone]

## 2016-05-30 NOTE — Progress Notes (Signed)
Bed alarm went off and pt was found on her knees at the foot of the bed trying to get to the bathroom. Pt put on the bed side commode vital signs were taken and stable asked if the patient was hurt pt said no. Pt cleaned and place back in bed bed alarm on and all bed rails up. NP and family notified no further complaints. Pt is resting will continue to monitor.

## 2016-05-30 NOTE — Care Management Important Message (Signed)
Important Message  Patient Details  Name: Cassandra Bonilla MRN: [id number] Date of Birth: [birth year]   Medicare Important Message Given:  Yes    [name] May 05/30/2016, 10:47 AM

## 2016-05-30 NOTE — Progress Notes (Signed)
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 Patient was recently hospitalized in [facility] between 8/5 and 8/18 for hypokalemia, acute on chronic systolic and right-sided heart failure, and discharged on Lasix 120 mg twice a day. Patient also had acute kidney injury, that slowly improved. Patient was also treated for hepatic encephalopathy and discharged on lactulose and rifaximin. 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 She underwent liver biopsy on 8/16, awaiting pathology and was supposed to follow-up with [facility]. Patient also has atrial fibrillation and was discharged on Lovenox and Coumadin.    Patient was discharged to SNF on 8/18 were she stayed about 8-10 days prior to being discharged home with husband. Patient supposedly had a supratherapeutic INR few days back. Husband noted that the patient developed melena about a week ago. Patient continued to get weaker, decreased by mouth intake, nauseous and more lethargic. Brought into ER at [facility] and she was found to have INR of 3.7. She was given vitamin K and 2 units of fresh frozen plasma and 1 unit of blood and transferred to [facility] for further evaluation. Hemoglobin upon presentation was 6.2. Patient was also found to have ammonia level of 107, and was given 300 mg per rectum of lactulose enema. Lactic acid was 8.1, troponin 0.10.  Patient transferred to [facility] for further evaluation   Subjective: 9/14  A/O 1 (does not aware, when, why). Patient fell overnight without injury C RN Lequita Halt, note on 9/14 . Follows all commands    Assessment & Plan:   Principal Problem:   GIB  (gastrointestinal bleeding) Active Problems:   Chronic atrial fibrillation (HCC)   Type 2 diabetes mellitus with stage 4 chronic kidney disease, with long-term current use of insulin (HCC)   CKD (chronic kidney disease), stage IV (HCC)   Encephalopathy, hepatic (HCC)   GI bleed   Blood in stool   Cryptogenic cirrhosis of liver (HCC)   Pulmonary hypertension ([place])   Uncontrolled type 2 diabetes mellitus with complication (HCC)   GIB - Acute hematochezia, melena  -anticoagulation reversed with vitamin & FF -9/12 Colonoscopy show multiple polyps -Capsule study: Normal study -Per GI note believe secondary to AVMs not seen on capsule study, recommend holding Coumadin. -Schedule Follow up with Dr. Gatha Mayer GI in 2-3 weeks for GI bleed  Multiple colon polyps -All polyps removed sent for pathology.  Coagulopathy  -INR 3.7 at presentation. Reversed with vitamin & FFP, in the setting of liver disease and spontaneous GI bleeding and does not appear that ongoing use of anticoagulation is safe in this patient. -Monitor INR  Recent Labs Lab 05/24/16 0711 05/25/16 0214 05/26/16 0320 05/29/16 0415 05/30/16 0859  INR 2.78 2.26 1.53 1.43 1.30    Cryptogenic Liver cirrhosis w/ Hepatic encephalopathy -Ceftriaxone for SBP prophylaxis discontinued. Patient was not on medication prior to admission no indication to continue. - encephalopathy worsened by upper GI bleed contributing to severe uremia -Lactulose 20 g BID -Rifaximin 400 mg TID -Continue to Limit all sedating medication. Patient cooperative but only A/O 1 (does not know where, when, why). New baseline? -Palliative care consult pending. Short-term vs long-term  goals of care. Changing of CODE STATUS. Hospice?  Acute encephalopathy -Due to hepatic encephalopathy plus uremia  - Negative MRI brain  - Ammonia level slightly elevated patient still extremely confused but cooperative  - BUN slowly drifting downward. If does not  continue to improve may need to reconsult nephrology for CRRT?  CKD Stage 4/5 -Nephrology following  Lab Results  Component Value Date   CREATININE 2.86 (H) 05/30/2016   CREATININE 2.90 (H) 05/29/2016   CREATININE 3.11 (H) 05/28/2016  -Nephrology has signed off. Schedule follow-up for 1-2 weeks with Dr Madelon Lips [facility] CKD stage 4/5  Chronic Afib -Rate controlled  - not safe for anticoag at this time secondary to multiple GI bleed. -9/14 currently in NSR -Upon discharge weight 1 week and then start  full dose aspirin.  Acute on chronic systolic CHF -EF [id number] via TTE 9/10, improved from 35% August 2017 -Strict in and out since admission -4.8 L  - follow daily weights Filed Weights   05/28/16 0500 05/29/16 0500 05/30/16 0521  Weight: 72.6 kg (160 lb 0.9 oz) 73.1 kg (161 lb 2.5 oz) 69.9 kg (154 lb)   - Lasix dosing per Nephrology  - no ACEi/ARB for obvious reasons  -Start Toprol 25 mg daily (home dose 50 mg)  -Lasix IV 80 mg BID -Schedule follow-up appointment for 1-2 weeks with [facility] acute on chronic systolic CHF with chronic A. fib, unable to be anticoagulated secondary to multiple GI bleed  Severe Pulmonary Hypertension -See CHF  Hypokalemia -Potassium goal > 4  Hypomagnesemia -Magnesium goal > 2  Elevated troponin -TTE noted improvement in ejection fraction from 35% > 45% w/ no significant wall motion abnormality  DM Type 2 uncontrolled with complication -9/8 hemoglobin A1c= 7.4 -Moderate SSI  Gout Quiescent    DVT prophylaxis: SCD Code Status: Full  Family Communication: None Disposition Plan: SNF   Consultants:  Dr Renelda Loma Armbruster. GI [facility]    Procedures/Significant Events:  9/10 Echocardiogram:Left ventricle: mild LVH. -LVEF=  40% to 45%. -  Mitral valve:mild to moderate regurgitation. - Left atrium: moderately dilated. - Right atrium: mildly dilated. -  Tricuspid valve: severe regurgitation. - Pulmonary arteries: PA peak pressure: 75 mm Hg (S). 9/12 Colonoscopy: -Diverticulosis in the sigmoid colon.  - Three 4 to 5 mm polyps in the ascending colo, -- Two 5 to 6 mm polyps in the transverse colon,  -  One 10 mm polyp in the transverse colon, - One 4 mm polyp in the descending colon, -- No clear etiology for iron deficiency and GI Bleeding on this exam, which I suspect is small bowel in etiology.    Cultures 9/12 all polyps removed: TUBULAR ADENOMA(S).- HIGH GRADE DYSPLASIA IS NOT IDENTIFIED.    Antimicrobials: Rocephin 9/7 > 9/14   Devices    LINES / TUBES:      Continuous Infusions:    Objective: Vitals:   05/29/16 2109 05/30/16 0239 05/30/16 0521 05/30/16 0600  BP:  (!) 115/51  (!) 144/71  Pulse:  75  98  Resp:      Temp: 97.6 F (36.4 C)   97.7 F (36.5 C)  TempSrc: Oral   Oral  SpO2:    97%  Weight:   69.9 kg (154 lb)   Height:        Intake/Output Summary (Last 24 hours) at 05/30/16 1216 Last data filed at 05/29/16 1900  Gross per 24 hour  Intake  0 ml  Output              900 ml  Net             -900 ml   Filed Weights   05/28/16 0500 05/29/16 0500 05/30/16 0521  Weight: 72.6 kg (160 lb 0.9 oz) 73.1 kg (161 lb 2.5 oz) 69.9 kg (154 lb)    Examination:  General: A/O 1 (does not know where, when, why), No acute respiratory distress Eyes: negative scleral hemorrhage, negative anisocoria, negative icterus ENT: Negative Runny nose, negative gingival bleeding, Neck:  Negative scars, masses, torticollis, lymphadenopathy, JVD Lungs: Clear to auscultation bilaterally without wheezes or crackles Cardiovascular: Regular rate and rhythm without murmur gallop or rub normal S1 and S2 Abdomen: Morbidly obese, negative abdominal pain, nondistended, positive soft, bowel sounds, no rebound, no ascites, no appreciable mass Extremities: No significant cyanosis, clubbing, or edema bilateral lower  extremities Skin: Negative rashes, lesions, ulcers Psychiatric:  Confabulation Central nervous system:  A/O 1, follows all commands but extremely confused. .     Data Reviewed: Care during the described time interval was provided by me .  I have reviewed this patient's available data, including medical history, events of note, physical examination, and all test results as part of my evaluation. I have personally reviewed and interpreted all radiology studies.  CBC:  Recent Labs Lab 05/25/16 1700 05/26/16 0320 05/27/16 0700 05/28/16 0313 05/29/16 0415 05/30/16 0859  WBC 7.6 7.8 7.0 6.8  --  6.2  NEUTROABS 5.9 5.9  --   --   --   --   HGB 8.5* 8.2* 8.5* 8.6* 9.1* 9.6*  HCT 26.6* 25.8* 27.5* 28.2* 29.6* 31.4*  MCV 82.9 84.0 85.9 84.4  --  87.2  PLT 191 169 201 204  --  [id number]   Basic Metabolic Panel:  Recent Labs Lab 05/24/16 1853  05/25/16 1659 05/26/16 0320 05/26/16 1830 05/27/16 0700 05/28/16 0313 05/29/16 0415 05/30/16 0859  NA 136  < > 135 136 132* 133* 133* 133* 136  K 3.4*  < > 2.8* 2.8* 3.6 3.4* 3.1* 3.3* 3.3*  CL 96*  < > 96* 95* 93* 93* 94* 94* 97*  CO2 25  < > 25 27 24 26 27 27 27   GLUCOSE 103*  < > 127* 136* 290* 316* 79 116* 149*  BUN 129*  < > 124* 125* 117* 113* 98* 90* 91*  CREATININE 3.50*  < > 3.55* 3.48* 3.55* 3.42* 3.11* 2.90* 2.86*  CALCIUM 7.8*  < > 7.6* 7.5* 7.6* 7.7* 8.0* 8.0* 8.2*  MG 2.0  --   --  1.8  --   --  1.5* 1.9 1.7  PHOS 6.5*  --  5.9* 5.9*  --  5.0* 4.2 3.8  --   < > = values in this interval not displayed. GFR: Estimated Creatinine Clearance: 15.6 mL/min (by C-G formula based on SCr of 2.86 mg/dL (H)). Liver Function Tests:  Recent Labs Lab 05/24/16 1853 05/25/16 0214  05/26/16 0320 05/27/16 0700 05/28/16 0313 05/29/16 0415 05/30/16 0859  AST 35 34  --  56*  --   --   --  34  ALT 21 21  --  30  --   --   --  20  ALKPHOS 200* 193*  --  169*  --   --   --  160*  BILITOT 1.8* 1.6*  --  1.2  --   --   --  0.7  PROT 7.2 6.5   --  6.3*  --   --   --  6.5  ALBUMIN 2.8* 2.5*  < > 2.5* 2.7* 2.7* 2.6* 2.7*  < > = values in this interval not displayed. No results for input(s): LIPASE, AMYLASE in the last 168 hours.  Recent Labs Lab 05/24/16 1853 05/26/16 2100 05/29/16 0415 05/30/16 0859  AMMONIA 41* 41* 23 56*   Coagulation Profile:  Recent Labs Lab 05/24/16 0711 05/25/16 0214 05/26/16 0320 05/29/16 0415 05/30/16 0859  INR 2.78 2.26 1.53 1.43 1.30   Cardiac Enzymes:  Recent Labs Lab 05/24/16 0523 05/24/16 1853 05/24/16 2210 05/25/16 0214  TROPONINI 0.08* 0.11* 0.12* 0.12*   BNP (last 3 results) No results for input(s): PROBNP in the last 8760 hours. HbA1C: No results for input(s): HGBA1C in the last 72 hours. CBG:  Recent Labs Lab 05/29/16 1117 05/29/16 1640 05/29/16 2107 05/30/16 0610 05/30/16 1104  GLUCAP 184* 232* 161* 166* 125*   Lipid Profile: No results for input(s): CHOL, HDL, LDLCALC, TRIG, CHOLHDL, LDLDIRECT in the last 72 hours. Thyroid Function Tests: No results for input(s): TSH, T4TOTAL, FREET4, T3FREE, THYROIDAB in the last 72 hours. Anemia Panel: No results for input(s): VITAMINB12, FOLATE, FERRITIN, TIBC, IRON, RETICCTPCT in the last 72 hours. Urine analysis:    Component Value Date/Time   COLORURINE YELLOW 05/25/2016 [place] 05/25/2016 1725   LABSPEC 1.014 05/25/2016 1725   PHURINE 6.0 05/25/2016 1725   GLUCOSEU NEGATIVE 05/25/2016 1725   HGBUR NEGATIVE 05/25/2016 1725   BILIRUBINUR NEGATIVE 05/25/2016 1725   KETONESUR NEGATIVE 05/25/2016 1725   PROTEINUR NEGATIVE 05/25/2016 1725   NITRITE NEGATIVE 05/25/2016 1725   LEUKOCYTESUR NEGATIVE 05/25/2016 1725   Sepsis Labs: @LABRCNTIP (procalcitonin:4,lacticidven:4)  ) Recent Results (from the past 240 hour(s))  MRSA PCR Screening     Status: None   Collection Time: 05/24/16  6:56 AM  Result Value Ref Range Status   MRSA by PCR NEGATIVE NEGATIVE Final    Comment:        The GeneXpert  MRSA Assay (FDA approved for NASAL specimens only), is one component of a comprehensive MRSA colonization surveillance program. It is not intended to diagnose MRSA infection nor to guide or monitor treatment for MRSA infections.          Radiology Studies: No results found.      Scheduled Meds: . darbepoetin (ARANESP) injection - NON-DIALYSIS  60 mcg Subcutaneous Q Sun-1800  . furosemide  80 mg Oral BID  . insulin aspart  0-15 Units Subcutaneous TID WC  . lactulose  20 g Oral BID  . metoprolol succinate  25 mg Oral Daily  . pantoprazole  40 mg Oral Q0600  . rifaximin  400 mg Oral TID   Continuous Infusions:    LOS: 6 days    Time spent: 40 minutes    Roselina Burgueno, Geraldo Docker, MD [facility] Pager [phone]   If 7PM-7AM, please contact night-coverage [email or web address] Password [email or web address] 05/30/2016, 12:16 PM

## 2016-05-31 LAB — GLUCOSE, CAPILLARY
GLUCOSE-CAPILLARY: 204 mg/dL — AB (ref 65–99)
GLUCOSE-CAPILLARY: 246 mg/dL — AB (ref 65–99)
Glucose-Capillary: 207 mg/dL — ABNORMAL HIGH (ref 65–99)
Glucose-Capillary: 140 mg/dL — ABNORMAL HIGH (ref 65–99)

## 2016-05-31 MED ORDER — CHOLESTYRAMINE LIGHT 4 G PO PACK
4.0000 g | PACK | Freq: Two times a day (BID) | ORAL | Status: DC
  Administered 2016-06-01 – 2016-06-04 (×6): 4 g via ORAL
  Filled 2016-05-31 (×7): qty 1

## 2016-05-31 MED ORDER — DIPHENOXYLATE-ATROPINE 2.5-0.025 MG PO TABS
1.0000 | ORAL_TABLET | Freq: Once | ORAL | Status: AC
  Administered 2016-05-31: 1 via ORAL
  Filled 2016-05-31: qty 1

## 2016-05-31 MED ORDER — INSULIN GLARGINE 100 UNIT/ML ~~LOC~~ SOLN
5.0000 [IU] | Freq: Every day | SUBCUTANEOUS | Status: DC
  Administered 2016-05-31 – 2016-06-04 (×5): 5 [IU] via SUBCUTANEOUS
  Filled 2016-05-31 (×5): qty 0.05

## 2016-05-31 NOTE — Progress Notes (Signed)
Pt has been experiencing diarrhea tonight and wanted something to stop it.  Dr. Maudie Mercury was informed and said to order a one time dose of Lomotil 1 tablet by mouth.  Will continue to monitor pt. Lupita Dawn, RN

## 2016-05-31 NOTE — Care Management Note (Signed)
Case Management Note Previous CM note initiated by Zenon Mayo, RN 05/29/2016, 11:53 AM    Patient Details  Name: Cassandra Bonilla MRN: [id number] Date of Birth: 19-Jan-1941  Subjective/Objective:    Presents with GIB, AMS, lethargic, Patient was previously in [facility] from last admission for 8-10 days then went home with spouse, she has a rolling walker, a cane, and w/chair and a bsc. She states she uses the w/chair mostly. Most likely will need pt eval.   9/11- per spouse, patient is active with [facility],- checking pt/inr  And meds and HHPT, will like to add a HHaide.  She has a pcp, she has insurance to cover medications, and she will be transported by car at dc.  Spouse will like to continue with [facility]. Her EGD (-) 9/10, protonix iv change to po today per RN. On lactulose for liver failure. Spouse is very supportive of patient at home (he states he does a lot for her in regards to assistance).       9/13- NCM spoke with Butch Penny at [facility], confirms patient is active with them for [facility] and [facility], she states the RN will have to come out to do assessment to see if they will be able to do an aide for them.  Also NCM will need to fax discharge summary to  [facility] at [phone], and they will need to be notified when patient is being discharged.                  Action/Plan: 9/15-update-Novalyn Lajara RN, CM-Pt tx from 3S to 2W- CSW consulted for possible SNF placement however pt only has 5 SNF days left before she has out of pocket copay cost- per CSW spouse can not afford out of pocket cost and wants to take pt back home with [facility] services- with [facility]-   Expected Discharge Date:                  Expected Discharge Plan:  [place]  In-House Referral:  Clinical Social Work  Discharge planning Services  CM Consult  Post Acute Care Choice:  Resumption of Svcs/PTA Provider, Home  Health Choice offered to:  Spouse  DME Arranged:    DME Agency:     HH Arranged:  RN, PT, Nurse's Aide [place] Agency:  [place]  Status of Service:  Completed, signed off  If discussed at [place] of Stay Meetings, dates discussed:    Additional Comments  05/31/16- 1545- Marvetta Gibbons RN, CM- HH orders have been faxed to [facility] per previous CM- resent order today via epic to [phone]- pt will need to have d/c summary faxed when available to same # and also a call made to [facility] to notify them of pt's discharge- [phone])   Dawayne Patricia, RN 05/31/2016, 3:41 PM [phone]

## 2016-05-31 NOTE — Progress Notes (Signed)
PROGRESS NOTE    Cassandra Bonilla  [id number] DOB: 05/27/1941 DOA: 05/24/2016 PCP: Marco Collie, MD   Brief Narrative:  75 year old with history of  Chronic Atrial fibrillation on Coumadin, Chronic Systolic CHF  with EF 0000000, Chronic anemia, CKD IV-V, HLD, DM Type 2, CAD native artery S/P CEA and CAD s/p 4v CABG 1993   Presented with melena and a hemoglobin of 6.2. recent hospitalization in [facility] ( 8/5 > 8/18) for hypokalemia, acute on chronic systolic and right-sided heart failure, and discharged on Lasix 120 mg twice a day. Patient also had acute kidney injury, that slowly improved. Patient was also treated for hepatic encephalopathy and discharged on lactulose and rifaximin. 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 She underwent liver biopsy on 8/16, awaiting pathology and was supposed to follow-up with [facility]. Patient also has atrial fibrillation and was discharged on Lovenox and Coumadin.    Patient was discharged to SNF on 8/18 were she stayed about 8-10 days prior to being discharged home with husband. Patient supposedly had a supratherapeutic INR few days back. Husband noted that the patient developed melena about a week ago. Patient continued to get weaker, decreased by mouth intake, nauseous and more lethargic. Brought into ER at [facility] and she was found to have INR of 3.7. She was given vitamin K and 2 units of fresh frozen plasma and 1 unit of blood and transferred to [facility] for further evaluation. Hemoglobin upon presentation was 6.2. Patient was also found to have ammonia level of 107, and was given 300 mg per rectum of lactulose enema. Lactic acid was 8.1, troponin 0.10.  Patient transferred to [facility] for further evaluation   Subjective: No significant complaints, reports the area overnight, there was afebrile, and his abdomen or chest pain  Assessment & Plan:   Principal Problem:   GIB (gastrointestinal bleeding) Active Problems:   Chronic  atrial fibrillation (HCC)   Type 2 diabetes mellitus with stage 4 chronic kidney disease, with long-term current use of insulin (HCC)   CKD (chronic kidney disease), stage IV (HCC)   Encephalopathy, hepatic (HCC)   GI bleed   Blood in stool   Cryptogenic cirrhosis of liver (HCC)   Pulmonary hypertension ([place])   Uncontrolled type 2 diabetes mellitus with complication (HCC)   Palliative care encounter   Goals of care, counseling/discussion   GIB - Acute hematochezia, melena  -anticoagulation reversed with vitamin & FF -9/12 Colonoscopy show multiple polyps -Capsule study: Normal study -Per GI note believe secondary to AVMs not seen on capsule study, recommend holding Coumadin. -Schedule Follow up with Dr. Gatha Mayer GI in 2-3 weeks for GI bleed  Multiple colon polyps -All polyps removed sent for pathology.  Coagulopathy  -INR 3.7 at presentation. Reversed with vitamin & FFP, in the setting of liver disease and spontaneous GI bleeding and does not appear that ongoing use of anticoagulation is safe in this patient. -Monitor INR  Recent Labs Lab 05/25/16 0214 05/26/16 0320 05/29/16 0415 05/30/16 0859  INR 2.26 1.53 1.43 1.30    Cryptogenic Liver cirrhosis w/ Hepatic encephalopathy -Ceftriaxone for SBP prophylaxis discontinued. Patient was not on medication prior to admission no indication to continue. - encephalopathy worsened by upper GI bleed contributing to severe uremia - Lactulose 20 g BID - Rifaximin 400 mg TID - Continue to Limit all sedating medication. Patient cooperative but only A/O 1 (does not know where, when, why). New baseline? - Palliative care consult appreciated, remains full code  Acute encephalopathy - Due to hepatic encephalopathy plus uremia  - Negative MRI brain  - Ammonia level slightly elevated   - BUN slowly drifting downward. If does not continue to improve may need to reconsult nephrology for CRRT?  CKD Stage 4/5 -Nephrology  following  Lab Results  Component Value Date   CREATININE 2.86 (H) 05/30/2016   CREATININE 2.90 (H) 05/29/2016   CREATININE 3.11 (H) 05/28/2016  -Nephrology has signed off. Schedule follow-up for 1-2 weeks with Dr Madelon Lips [facility] CKD stage 4/5  Chronic Afib -Rate controlled  - not safe for anticoag at this time secondary to multiple GI bleed. - 9/14 currently in NSR - Upon discharge weight 1 week and then start  full dose aspirin.  Acute on chronic systolic CHF -EF [id number] via TTE 9/10, improved from 35% August 2017 -Strict in and out since admission -5.1L - follow daily weights Filed Weights   05/28/16 0500 05/29/16 0500 05/30/16 0521  Weight: 72.6 kg (160 lb 0.9 oz) 73.1 kg (161 lb 2.5 oz) 69.9 kg (154 lb)   - Lasix dosing per Nephrology  - no ACEi/ARB for obvious reasons  - Start Toprol 25 mg daily (home dose 50 mg)  - Lasix IV 80 mg BID - Schedule follow-up appointment for 1-2 weeks with [facility] acute on chronic systolic CHF with chronic A. fib, unable to be anticoagulated secondary to multiple GI bleed  Severe Pulmonary Hypertension - See CHF  Hypokalemia - Potassium goal > 4  Hypomagnesemia - Magnesium goal > 2  Elevated troponin - TTE noted improvement in ejection fraction from 35% > 45% w/ no significant wall motion abnormality  DM Type 2 uncontrolled with complication - 9/8 hemoglobin A1c= 7.4 - Moderate SSI, CBG started to increase, will start on low-dose Lantus  Gout Quiescent    DVT prophylaxis: SCD Code Status: Full  Family Communication: None Disposition Plan: SNF   Consultants:  Dr Renelda Loma Armbruster. [facility]    Procedures/Significant Events:  9/10 Echocardiogram:Left ventricle: mild LVH. -LVEF=  40% to 45%. -  Mitral valve:mild to moderate regurgitation. - Left atrium: moderately dilated. - Right atrium: mildly dilated. - Tricuspid valve: severe  regurgitation. - Pulmonary arteries: PA peak pressure: 75 mm Hg (S). 9/12 Colonoscopy: -Diverticulosis in the sigmoid colon.  - Three 4 to 5 mm polyps in the ascending colo, -- Two 5 to 6 mm polyps in the transverse colon,  -  One 10 mm polyp in the transverse colon, - One 4 mm polyp in the descending colon, -- No clear etiology for iron deficiency and GI Bleeding on this exam, which I suspect is small bowel in etiology.    Cultures 9/12 all polyps removed: TUBULAR ADENOMA(S).- HIGH GRADE DYSPLASIA IS NOT IDENTIFIED.    Antimicrobials: Rocephin 9/7 > 9/14   Objective: Vitals:   05/30/16 1215 05/30/16 1458 05/30/16 2202 05/31/16 0357  BP: (!) 120/55 136/90 120/71 (!) 107/47  Pulse: (!) 56 92 84 92  Resp:  18 18 20   Temp:  98.1 F (36.7 C) 99 F (37.2 C) 98.3 F (36.8 C)  TempSrc:  Oral Oral Oral  SpO2:  100% 100% 99%  Weight:      Height:        Intake/Output Summary (Last 24 hours) at 05/31/16 1140 Last data filed at 05/31/16 0800  Gross per 24 hour  Intake              240 ml  Output              500 ml  Net             -260 ml   Filed Weights   05/28/16 0500 05/29/16 0500 05/30/16 0521  Weight: 72.6 kg (160 lb 0.9 oz) 73.1 kg (161 lb 2.5 oz) 69.9 kg (154 lb)    Examination:  Awake alert 2, resident, communicative Supple neck, right IJ TLC, no JVD Good air entry bilaterally, no wheezing rales or rhonchi RRR, murmur or gallops Abdomen soft, nontender, nondistended, bowel sounds present, no ascites appreciated Images, no edema, no clubbing, no cyanosis      Data Reviewed: Care during the described time interval was provided by me .  I have reviewed this patient's available data, including medical history, events of note, physical examination, and all test results as part of my evaluation.  CBC:  Recent Labs Lab 05/25/16 1700 05/26/16 0320 05/27/16 0700 05/28/16 0313 05/29/16 0415 05/30/16 0859  WBC 7.6 7.8 7.0 6.8  --  6.2  NEUTROABS 5.9 5.9  --    --   --   --   HGB 8.5* 8.2* 8.5* 8.6* 9.1* 9.6*  HCT 26.6* 25.8* 27.5* 28.2* 29.6* 31.4*  MCV 82.9 84.0 85.9 84.4  --  87.2  PLT 191 169 201 204  --  123456   Basic Metabolic Panel:  Recent Labs Lab 05/24/16 1853  05/25/16 1659 05/26/16 0320 05/26/16 1830 05/27/16 0700 05/28/16 0313 05/29/16 0415 05/30/16 0859  NA 136  < > 135 136 132* 133* 133* 133* 136  K 3.4*  < > 2.8* 2.8* 3.6 3.4* 3.1* 3.3* 3.3*  CL 96*  < > 96* 95* 93* 93* 94* 94* 97*  CO2 25  < > 25 27 24 26 27 27 27   GLUCOSE 103*  < > 127* 136* 290* 316* 79 116* 149*  BUN 129*  < > 124* 125* 117* 113* 98* 90* 91*  CREATININE 3.50*  < > 3.55* 3.48* 3.55* 3.42* 3.11* 2.90* 2.86*  CALCIUM 7.8*  < > 7.6* 7.5* 7.6* 7.7* 8.0* 8.0* 8.2*  MG 2.0  --   --  1.8  --   --  1.5* 1.9 1.7  PHOS 6.5*  --  5.9* 5.9*  --  5.0* 4.2 3.8  --   < > = values in this interval not displayed. GFR: Estimated Creatinine Clearance: 15.6 mL/min (by C-G formula based on SCr of 2.86 mg/dL (H)). Liver Function Tests:  Recent Labs Lab 05/24/16 1853 05/25/16 0214  05/26/16 0320 05/27/16 0700 05/28/16 0313 05/29/16 0415 05/30/16 0859  AST 35 34  --  56*  --   --   --  34  ALT 21 21  --  30  --   --   --  20  ALKPHOS 200* 193*  --  169*  --   --   --  160*  BILITOT 1.8* 1.6*  --  1.2  --   --   --  0.7  PROT 7.2 6.5  --  6.3*  --   --   --  6.5  ALBUMIN 2.8* 2.5*  < > 2.5* 2.7* 2.7* 2.6* 2.7*  < > = values in this interval not displayed. No results for input(s): LIPASE, AMYLASE in the last 168 hours.  Recent Labs Lab 05/24/16 1853 05/26/16 2100 05/29/16 0415 05/30/16 0859  AMMONIA 41* 41* 23 56*   Coagulation Profile:  Recent Labs Lab 05/25/16 0214 05/26/16 0320  05/29/16 0415 05/30/16 0859  INR 2.26 1.53 1.43 1.30   Cardiac Enzymes:  Recent Labs Lab 05/24/16 1853 05/24/16 2210 05/25/16 0214  TROPONINI 0.11* 0.12* 0.12*   BNP (last 3 results) No results for input(s): PROBNP in the last 8760 hours. HbA1C: No results  for input(s): HGBA1C in the last 72 hours. CBG:  Recent Labs Lab 05/30/16 1104 05/30/16 1648 05/30/16 2155 05/31/16 0614 05/31/16 1117  GLUCAP 125* 244* 207* 204* 207*   Lipid Profile: No results for input(s): CHOL, HDL, LDLCALC, TRIG, CHOLHDL, LDLDIRECT in the last 72 hours. Thyroid Function Tests: No results for input(s): TSH, T4TOTAL, FREET4, T3FREE, THYROIDAB in the last 72 hours. Anemia Panel: No results for input(s): VITAMINB12, FOLATE, FERRITIN, TIBC, IRON, RETICCTPCT in the last 72 hours. Urine analysis:    Component Value Date/Time   COLORURINE YELLOW 05/25/2016 [place] 05/25/2016 1725   LABSPEC 1.014 05/25/2016 1725   PHURINE 6.0 05/25/2016 1725   GLUCOSEU NEGATIVE 05/25/2016 1725   HGBUR NEGATIVE 05/25/2016 1725   BILIRUBINUR NEGATIVE 05/25/2016 1725   KETONESUR NEGATIVE 05/25/2016 1725   PROTEINUR NEGATIVE 05/25/2016 1725   NITRITE NEGATIVE 05/25/2016 1725   LEUKOCYTESUR NEGATIVE 05/25/2016 1725   Sepsis Labs: @LABRCNTIP (procalcitonin:4,lacticidven:4)  ) Recent Results (from the past 240 hour(s))  MRSA PCR Screening     Status: None   Collection Time: 05/24/16  6:56 AM  Result Value Ref Range Status   MRSA by PCR NEGATIVE NEGATIVE Final    Comment:        The GeneXpert MRSA Assay (FDA approved for NASAL specimens only), is one component of a comprehensive MRSA colonization surveillance program. It is not intended to diagnose MRSA infection nor to guide or monitor treatment for MRSA infections.          Radiology Studies: No results found.      Scheduled Meds: . camphor-menthol   Topical BID  . cholestyramine  4 g Oral BID  . darbepoetin (ARANESP) injection - NON-DIALYSIS  60 mcg Subcutaneous Q Sun-1800  . furosemide  80 mg Oral BID  . insulin aspart  0-15 Units Subcutaneous TID WC  . lactulose  20 g Oral BID  . metoprolol succinate  25 mg Oral Daily  . pantoprazole  40 mg Oral Q0600  . rifaximin  400 mg Oral TID     Continuous Infusions:    LOS: 7 days      Phillips Climes, MD [facility] Pager [phone]  If 7PM-7AM, please contact night-coverage [email or web address] Password [email or web address] 05/31/2016, 11:40 AM

## 2016-05-31 NOTE — Progress Notes (Signed)
Daily Progress Note   Patient Name: Cassandra Bonilla       Date: 05/31/2016 DOB: 02-May-1941  Bonilla: 75 y.o. MRN#: [id number] Attending Physician: Albertine Patricia, MD Primary Care Physician: Marco Collie, MD Admit Date: 05/24/2016  Reason for Consultation/Follow-up: Establishing goals of care  Subjective: Cassandra Bonilla awake, alert, and comfortable this afternoon. She denies pain or discomfort. Praying with a church member and husband upon arrival to room. Cassandra Bonilla talked with her about advanced directives last night and she is not interesting in filling out the paperwork. She states she puts her trust in her husband to make decisions and the doctors to take good care of her. She tells me she is not giving up yet and she believes in miracles and that she will be healed. Cassandra Bonilla is very appreciative of all the good care she has received at this hospital.   Length of Stay: 7  Current Medications: Scheduled Meds:  . camphor-menthol   Topical BID  . cholestyramine  4 g Oral BID  . darbepoetin (ARANESP) injection - NON-DIALYSIS  60 mcg Subcutaneous Q Sun-1800  . furosemide  80 mg Oral BID  . insulin aspart  0-15 Units Subcutaneous TID WC  . lactulose  20 g Oral BID  . metoprolol succinate  25 mg Oral Daily  . pantoprazole  40 mg Oral Q0600  . rifaximin  400 mg Oral TID    Continuous Infusions:    PRN Meds: acetaminophen **OR** acetaminophen, HYDROcodone-acetaminophen, hydrOXYzine, ondansetron **OR** ondansetron (ZOFRAN) IV  Physical Exam  Constitutional: She appears well-developed. She is cooperative.  Cardiovascular: An irregularly irregular rhythm present.  afib-rate controlled  Pulmonary/Chest: Effort normal and breath sounds normal. No accessory muscle usage. No respiratory distress.    Abdominal: Soft. Bowel sounds are normal. She exhibits no distension. There is no tenderness.  Neurological: She is alert.  Oriented to person and place. Forgetful   Skin: Skin is dry.  Psychiatric: She has a normal mood and affect. Her speech is normal and behavior is normal.  Nursing note and vitals reviewed.           Vital Signs: BP (!) 107/47 (BP Location: Right Arm)   Pulse 92   Temp 98.3 F (36.8 C) (Oral)   Resp 20   Ht 5\' 2"  (1.575 m)   Wt  69.9 kg (154 lb)   SpO2 99%   BMI 28.17 kg/m  SpO2: SpO2: 99 % O2 Device: O2 Device: Not Delivered O2 Flow Rate: O2 Flow Rate (L/min): 2 L/min  Intake/output summary:  Intake/Output Summary (Last 24 hours) at 05/31/16 1128 Last data filed at 05/31/16 0800  Gross per 24 hour  Intake              240 ml  Output              500 ml  Net             -260 ml   LBM: Last BM Date: 05/30/16 Baseline Weight: Weight: 74.7 kg (164 lb 10.9 oz) Most recent weight: Weight: 69.9 kg (154 lb)       Palliative Assessment/Data: PPS 40%   Flowsheet Rows   Flowsheet Row Most Recent Value  Intake Tab  Referral Department  Hospitalist  Unit at Time of Referral  Med/Surg Unit  Palliative Care Primary Diagnosis  Trauma  Date Notified  05/30/16  Palliative Care Type  New Palliative care  Reason for referral  Clarify Goals of Care  Date of Admission  05/24/16  Date first seen by Palliative Care  05/30/16  # of days Palliative referral response time  0 Day(s)  # of days IP prior to Palliative referral  6  Clinical Assessment  Palliative Performance Scale Score  40%  Psychosocial & Spiritual Assessment  Palliative Care Outcomes  Patient/Family meeting held?  Yes  Who was at the meeting?  patient and husband  Palliative Care Outcomes  Counseled regarding hospice, Clarified goals of care, Improved non-pain symptom therapy      Patient Active Problem List   Diagnosis Date Noted  . Palliative care encounter   . Goals of care,  counseling/discussion   . Cryptogenic cirrhosis of liver ([place])   . Pulmonary hypertension ([place])   . Uncontrolled type 2 diabetes mellitus with complication ([place])   . Blood in stool   . GIB (gastrointestinal bleeding) 05/24/2016  . Encephalopathy, hepatic ([place]) 05/24/2016  . GI bleed 05/24/2016  . Abnormal alkaline phosphatase test   . Acute encephalopathy 04/22/2016  . Acute on chronic renal failure ([place]) 04/22/2016  . Fluid overload 04/20/2016  . Hypokalemia 03/22/2016  . Pruritus 03/22/2016  . Anemia of chronic disease 03/22/2016  . Chronic atrial fibrillation ([place]) 03/22/2016  . Type 2 diabetes mellitus with stage 4 chronic kidney disease, with long-term current use of insulin ([place]) 03/22/2016  . Acute on chronic systolic (congestive) heart failure ([place]) 03/22/2016  . CKD (chronic kidney disease), stage IV ([place]) 03/22/2016    Palliative Care Assessment & Plan   Patient Profile: 75 y.o. female  with past medical history of CAD, COPD with pulmonary hypertension, CKD, systolic CHF, Cirrhosis admitted on 05/24/2016 with melena, acute encephalopathy, and acute on chronic renal insufficiency. Recent hospitalization for hypokalemia and acute on chronic systolic and right-sided heart failure. Discharged to rehab and then home with husband after completion of rehab. On admission, patient given vitamin K, 2 units FFP, and 1 unit PRBC. GI consulted. Patient had EGD and colonoscopy-multiple polyps sent for pathology. Capsule study normal. Recommending holding coumadin and outpatient f/u in 2-3 weeks. Chronic kidney disease-nephrology has signed off and also recommending outpatient f/u in 1-2 weeks. Also diagnosed with liver cirrhosis with hepatic encephalopathy. Negative MRI. Receiving lactulose and rifaximin. Acute on chronic CHF with EF 40-45%. Palliative medicine consultation for goals of care-patient with multisystem organ failure.  Assessment: GI Bleed Chronic kidney disease Liver  cirrhosis Hepatic Encephalopathy Chronic atrial fibrillation Acute on chronic heart failure Pulmonary hypertension  Recommendations/Plan:  PMT will continue to follow patient during hospitalization and discuss goals of care.  Patient wishes to remain a FULL code at this point in time.   Husband is a retired Veterinary surgeon and familiar with hospice resources. He knows to call us with questions or if they decide to pursue hospice services.   Goals of Care and Additional Recommendations:  Limitations on Scope of Treatment: Full Scope Treatment  Code Status: FULL   Code Status Orders        Start     Ordered   05/24/16 0747  Full code  Continuous     05/24/16 0748    Code Status History    Date Active Date Inactive Code Status Order ID Comments User Context   04/20/2016  9:19 PM 05/03/2016  7:07 PM Full Code [id number]  Edwin Dada, MD Inpatient   03/22/2016  3:02 AM 03/24/2016  8:19 PM Full Code [id number]  Norval Morton, MD ED       Prognosis:   Unable to determine-acute on chronic CHF, CKD, and liver cirrhosis with hepatic encephalopathy  Discharge Planning:  To Be Determined-home health vs. Home with hospice services   Care plan was discussed with patient and husband, [name]  Thank you for allowing the Palliative Medicine Team to assist in the care of this patient.   Time In: 1100 Time Out: 1125 Total Time 47min Prolonged Time Billed  no       Greater than 50%  of this time was spent counseling and coordinating care related to the above assessment and plan.  Ihor Dow, FNP-C Palliative Medicine Team  Phone: [phone] Fax: [phone]  Please contact Palliative Medicine Team phone at [phone] for questions and concerns.

## 2016-05-31 NOTE — NC FL2 (Signed)
[place] LEVEL OF CARE SCREENING TOOL     IDENTIFICATION  Patient Name: Cassandra Bonilla Birthdate: [birth year] Sex: female Admission Date (Current Location): 05/24/2016  [facility] and [place] Number:  [employer] and Address:  [place], [place] [address] [place], [place] [address]      Provider Number: [id number]  Attending Physician Name and Address:  Albertine Patricia, MD  Relative Name and Phone Number:       Current Level of Care: Hospital Recommended Level of Care: [place] Prior Approval Number:    Date Approved/Denied:   PASRR Number: [id number]  Discharge Plan: SNF    Current Diagnoses: Patient Active Problem List   Diagnosis Date Noted  . Palliative care encounter   . Goals of care, counseling/discussion   . Cryptogenic cirrhosis of liver ([place])   . Pulmonary hypertension ([place])   . Uncontrolled type 2 diabetes mellitus with complication ([place])   . Blood in stool   . GIB (gastrointestinal bleeding) 05/24/2016  . Encephalopathy, hepatic ([place]) 05/24/2016  . GI bleed 05/24/2016  . Abnormal alkaline phosphatase test   . Acute encephalopathy 04/22/2016  . Acute on chronic renal failure ([place]) 04/22/2016  . Fluid overload 04/20/2016  . Hypokalemia 03/22/2016  . Pruritus 03/22/2016  . Anemia of chronic disease 03/22/2016  . Chronic atrial fibrillation ([place]) 03/22/2016  . Type 2 diabetes mellitus with stage 4 chronic kidney disease, with long-term current use of insulin ([place]) 03/22/2016  . Acute on chronic systolic (congestive) heart failure ([place]) 03/22/2016  . CKD (chronic kidney disease), stage IV ([place]) 03/22/2016    Orientation RESPIRATION BLADDER Height & Weight     Self, Time, Situation  Normal Continent Weight: 154 lb (69.9 kg) Height:  5\' 2"  (157.5 cm)  BEHAVIORAL SYMPTOMS/MOOD NEUROLOGICAL BOWEL NUTRITION STATUS   (none)  (None) Incontinent  ([place] Modified)  AMBULATORY STATUS COMMUNICATION OF  NEEDS Skin   Limited Assist Verbally Normal                       Personal Care Assistance Level of Assistance  Bathing, Feeding, Dressing Bathing Assistance: Limited assistance Feeding assistance: Independent Dressing Assistance: Limited assistance     Functional Limitations Info  Sight, Hearing, Speech Sight Info: Adequate Hearing Info: Adequate Speech Info: Adequate    SPECIAL CARE FACTORS FREQUENCY  PT (By licensed PT), OT (By licensed OT)     PT Frequency: 5/ week OT Frequency: 5/ week            Contractures Contractures Info: Not present    Additional Factors Info  Code Status, Allergies, Insulin Sliding Scale Code Status Info: FULL Allergies Info: Contrast Media, Iodinated Diagnostic Agents, Prednisone   Insulin Sliding Scale Info: insulin aspart (novoLOG) injection 0-15 Units Dose: 0-15 Units Freq: 3 times daily with meals Route: [place]       Current Medications (05/31/2016):  This is the current hospital active medication list Current Facility-Administered Medications  Medication Dose Route Frequency Provider Last Rate Last Dose  . acetaminophen (TYLENOL) tablet 500 mg  500 mg Oral Q6H PRN Cherene Altes, MD   500 mg at 05/29/16 2318   Or  . acetaminophen (TYLENOL) suppository 325 mg  325 mg Rectal Q6H PRN Cherene Altes, MD      . camphor-menthol [facility]) lotion   Topical BID Melton Alar, PA-C      . cholestyramine Lucrezia Starch) packet 4 g  4 g Oral  BID Melton Alar, PA-C   4 g at 05/30/16 2223  . Darbepoetin Alfa (ARANESP) injection 60 mcg  60 mcg Subcutaneous Q Sun-1800 Roney Jaffe, MD   60 mcg at 05/26/16 1802  . furosemide (LASIX) tablet 80 mg  80 mg Oral BID Allie Bossier, MD   80 mg at 05/30/16 1714  . HYDROcodone-acetaminophen (NORCO/VICODIN) 5-325 MG per tablet 1-2 tablet  1-2 tablet Oral Q6H PRN Cherene Altes, MD      . hydrOXYzine (ATARAX/VISTARIL) tablet 25 mg  25 mg Oral TID PRN Allie Bossier, MD   25 mg at 05/31/16 0131  .  insulin aspart (novoLOG) injection 0-15 Units  0-15 Units Subcutaneous TID WC Jeryl Columbia, NP   5 Units at 05/31/16 0701  . lactulose (CHRONULAC) 10 GM/15ML solution 20 g  20 g Oral BID Allie Bossier, MD      . metoprolol succinate (TOPROL-XL) 24 hr tablet 25 mg  25 mg Oral Daily Allie Bossier, MD      . ondansetron [facility]) tablet 4 mg  4 mg Oral Q6H PRN Reyne Dumas, MD       Or  . ondansetron (ZOFRAN) injection 4 mg  4 mg Intravenous Q6H PRN Reyne Dumas, MD      . pantoprazole (PROTONIX) EC tablet 40 mg  40 mg Oral Q0600 Vena Rua, PA-C   40 mg at 05/31/16 0631  . rifaximin (XIFAXAN) tablet 400 mg  400 mg Oral TID Reyne Dumas, MD   400 mg at 05/30/16 2222     Discharge Medications: Please see discharge summary for a list of discharge medications.  Relevant Imaging Results:  Relevant Lab Results:   Additional Information SS#: [id number]  Samule Dry, LCSW

## 2016-05-31 NOTE — Progress Notes (Addendum)
Physical Therapy Treatment Patient Details Name: Cassandra Bonilla MRN: [id number] DOB: 14-Jan-1941 Today's Date: 05/31/2016    History of Present Illness 75 y.o. female admitted to [facility] on 05/24/16 for nausea, decreased po intake and lethargy.  Found to have supratherapeutic INR and was given K+ and 2 units of plasma, 2 units of blood.  Pt underwent EGD with no significant results and GI and Internal medicine following.  Pt dx with acute encepholopathy.  Pt with significant PMHx of recent admission 8/5-8/18/17 for hypokalemia and acute on chronic heart failure.  She d/c to SNF for rehab and then to home.  Pt with other significant PMHx of HTN, gout, CAD, COPD, CKD III, and CABG.      PT Comments    Pt continues to demonstrate poor mobility. When entered room pt lying comfortably in bed. Once we sat pt up she began scratching constantly. Very difficult to redirect pt to attempt further mobility. Stood twice with walker but pt could not or would not take any steps. Returned pt to supine where she immediately stopped scratching and began conversing with family and visitor. Feel some of pt's poor mobility is due to cognitive impairments.  Follow Up Recommendations  Home health PT;Supervision/Assistance - 24 hour (Pt's husband states they are unable to afford SNF copay)     Equipment Recommendations  None recommended by PT    Recommendations for Other Services       Precautions / Restrictions Precautions Precautions: Fall Restrictions Weight Bearing Restrictions: No    Mobility  Bed Mobility Overal bed mobility: Needs Assistance Bed Mobility: Supine to Sit;Sit to Supine     Supine to sit: Max assist;+2 for physical assistance;HOB elevated Sit to supine: Max assist   General bed mobility comments: Assist to move legs off bed and elevate trunk.   Transfers Overall transfer level: Needs assistance Equipment used: Rolling walker (2 wheeled) Transfers: Sit to/from Stand Sit to Stand: Mod  assist;+2 physical assistance         General transfer comment: Assist to bring hips and trunk up. Pt with heavy posterior lean against bed. Pt keeps LLE slightly forward and was not able to bring it under her center of gravity.  Ambulation/Gait             General Gait Details: Attempted but pt did not step with either foot.   Stairs            Wheelchair Mobility    Modified Rankin (Stroke Patients Only)       Balance Overall balance assessment: Needs assistance Sitting-balance support: Feet supported;Bilateral upper extremity supported Sitting balance-Leahy Scale: Poor Sitting balance - Comments: UE assist and supervision   Standing balance support: Bilateral upper extremity supported Standing balance-Leahy Scale: Poor Standing balance comment: walker and mod A                    Cognition Arousal/Alertness: Awake/alert Behavior During Therapy: WFL for tasks assessed/performed Overall Cognitive Status: Impaired/Different from baseline Area of Impairment: Memory;Attention;Following commands;Safety/judgement;Problem solving   Current Attention Level: Sustained Memory: Decreased recall of precautions;Decreased short-term memory Following Commands: Follows one step commands inconsistently;Follows one step commands with increased time Safety/Judgement: Decreased awareness of safety;Decreased awareness of deficits   Problem Solving: Slow processing;Decreased initiation;Requires verbal cues;Requires tactile cues;Difficulty sequencing      Exercises      General Comments        Pertinent Vitals/Pain Pain Assessment: No/denies pain    Home Living  Prior Function            PT Goals (current goals can now be found in the care plan section) Progress towards PT goals: Not progressing toward goals - comment    Frequency    Min 3X/week      PT Plan Discharge plan needs to be updated    Co-evaluation              End of Session Equipment Utilized During Treatment: Gait belt Activity Tolerance: Patient limited by fatigue Patient left: with call bell/phone within reach;in bed;with bed alarm set;with family/visitor present     Time: [id number] PT Time Calculation (min) (ACUTE ONLY): 18 min  Charges:  $Therapeutic Activity: 8-22 mins                    G Codes:      Jc Veron 2016/06/09, 3:49 PM [facility] PT [phone]

## 2016-05-31 NOTE — Clinical Social Work Note (Signed)
Clinical Social Work Assessment  Patient Details  Name: Cassandra Bonilla MRN: [id number] Date of Birth: 11/29/1940  Date of referral:  05/31/16               Reason for consult:  Discharge Planning                Permission sought to share information with:  Facility Sport and exercise psychologist, Family Supports Permission granted to share information::     Name::     Art gallery manager::  SNFs   Relationship::  Spouse   Contact Information:     Housing/Transportation Living arrangements for the past 2 months:  Apartment Source of Information:  Patient, Spouse Patient Interpreter Needed:  None Criminal Activity/Legal Involvement Pertinent to Current Situation/Hospitalization:  No - Comment as needed Significant Relationships:  Adult Children, Spouse Lives with:    Do you feel safe going back to the place where you live?  No Need for family participation in patient care:  No (Coment)  Care giving concerns:  CSW spoke with patient's husband. He reported patient is out Medicare days and he has no funds to pay SNF for short term rehab.    Social Worker assessment / plan:  CSW met with patient at beside to complete assessment. Patient was resting comfortably in bed. CSW explained PT recommendation for SNF placement. CSW explained SNF search and placement process to the patient and answered her  questions. Patient reported her support as her husband. Per patient request CSW called Younique Casad and He reported patient is out of Medicare days and he has no funds to pay SNF for short term rehab. CSW will check with last facility SNF to see if patient is out of Medicare days.   messageCSW will follow up with bed offers.  Employment status:  Retired Forensic scientist:  [employer] PT Recommendations:  [place] / Referral to community resources:  [place]  Patient/Family's Response to care: The patient appears happy with the care she is receiving in  hospital and is appreciative of CSW assistance.  Patient/Family's Understanding of and Emotional Response to Diagnosis, Current Treatment, and Prognosis:  The patient has a good understanding of why she was admitted. She understands the care plan and what she will need post discharge.  Emotional Assessment Appearance:  Appears stated age Attitude/Demeanor/Rapport:   (Patient was appropriate.) Affect (typically observed):  Accepting, Calm, Appropriate Orientation:  Oriented to Self, Oriented to Place, Oriented to  Time Alcohol / Substance use:  Not Applicable Psych involvement (Current and /or in the community):  No (Comment)  Discharge Needs  Concerns to be addressed:  Discharge Planning Concerns Readmission within the last 30 days:  Yes Current discharge risk:  Physical Impairment Barriers to Discharge:  Continued Medical Work up   [employer], LCSW 05/31/2016, 11:25 AM

## 2016-05-31 NOTE — Clinical Social Work Note (Signed)
CSW received one bed offer from [facility] and [facility]. CSW called patient's husband informed him of bed offer. Patient's husband reported he would not accept bed offer for [facility]. He reported preferred [facility]. CSW informed him [facility] declined to make a bed offer. Patient's husband reported that patient is in her co-pay days and he can not afford to pay. He reported he would like to take patient home. CSW signing off at this time. CSW informed RNCM.  [employer], LCSW [phone]

## 2016-05-31 NOTE — NC FL2 (Deleted)
[place] LEVEL OF CARE SCREENING TOOL     IDENTIFICATION  Patient Name: Cassandra Bonilla Birthdate: [birth year] Sex: female Admission Date (Current Location): 05/24/2016  [facility] and [place] Number:  [employer] and Address:  The . [facility], [place] [address] [place], [place] [address]      Provider Number: [id number]  Attending Physician Name and Address:  Albertine Patricia, MD  Relative Name and Phone Number:       Current Level of Care: Hospital Recommended Level of Care: [place] Prior Approval Number:    Date Approved/Denied:   PASRR Number:    Discharge Plan: SNF    Current Diagnoses: Patient Active Problem List   Diagnosis Date Noted  . Palliative care encounter   . Goals of care, counseling/discussion   . 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 Pulmonary hypertension ([place])   . Uncontrolled type 2 diabetes mellitus with complication ([place])   . Blood in stool   . GIB (gastrointestinal bleeding) 05/24/2016  . Encephalopathy, hepatic ([place]) 05/24/2016  . GI bleed 05/24/2016  . Abnormal alkaline phosphatase test   . Acute encephalopathy 04/22/2016  . Acute on chronic renal failure ([place]) 04/22/2016  . Fluid overload 04/20/2016  . Hypokalemia 03/22/2016  . Pruritus 03/22/2016  . Anemia of chronic disease 03/22/2016  . Chronic atrial fibrillation ([place]) 03/22/2016  . Type 2 diabetes mellitus with stage 4 chronic kidney disease, with long-term current use of insulin ([place]) 03/22/2016  . Acute on chronic systolic (congestive) heart failure ([place]) 03/22/2016  . CKD (chronic kidney disease), stage IV ([place]) 03/22/2016    Orientation RESPIRATION BLADDER Height & Weight        Normal Continent Weight: 154 lb (69.9 kg) Height:  5\' 2"  (157.5 cm)  BEHAVIORAL SYMPTOMS/MOOD NEUROLOGICAL BOWEL NUTRITION STATUS      Incontinent Diet  AMBULATORY STATUS COMMUNICATION OF NEEDS Skin   Extensive Assist Verbally Normal                       Personal Care Assistance Level of Assistance  Bathing, Dressing           Functional Limitations Info  Sight, Hearing, Speech Sight Info: Adequate Hearing Info: Adequate Speech Info: Adequate    SPECIAL CARE FACTORS FREQUENCY  PT (By licensed PT), OT (By licensed OT)                    Contractures Contractures Info: Not present    Additional Factors Info  Code Status, Allergies, Insulin Sliding Scale               Current Medications (05/31/2016):  This is the current hospital active medication list Current Facility-Administered Medications  Medication Dose Route Frequency Provider Last Rate Last Dose  . acetaminophen (TYLENOL) tablet 500 mg  500 mg Oral Q6H PRN Cherene Altes, MD   500 mg at 05/29/16 2318   Or  . acetaminophen (TYLENOL) suppository 325 mg  325 mg Rectal Q6H PRN Cherene Altes, MD      . camphor-menthol [facility]) lotion   Topical BID Melton Alar, PA-C      . cholestyramine Lucrezia Starch) packet 4 g  4 g Oral BID Melton Alar, PA-C   4 g at 05/30/16 2223  . Darbepoetin Alfa (ARANESP) injection 60 mcg  60 mcg Subcutaneous Q Sun-1800 Roney Jaffe, MD   60 mcg at 05/26/16 1802  . furosemide (LASIX)  tablet 80 mg  80 mg Oral BID Allie Bossier, MD   80 mg at 05/30/16 1714  . HYDROcodone-acetaminophen (NORCO/VICODIN) 5-325 MG per tablet 1-2 tablet  1-2 tablet Oral Q6H PRN Cherene Altes, MD      . hydrOXYzine (ATARAX/VISTARIL) tablet 25 mg  25 mg Oral TID PRN Allie Bossier, MD   25 mg at 05/31/16 0131  . insulin aspart (novoLOG) injection 0-15 Units  0-15 Units Subcutaneous TID WC Jeryl Columbia, NP   5 Units at 05/31/16 0701  . lactulose (CHRONULAC) 10 GM/15ML solution 20 g  20 g Oral BID Allie Bossier, MD      . metoprolol succinate (TOPROL-XL) 24 hr tablet 25 mg  25 mg Oral Daily Allie Bossier, MD      . ondansetron [facility]) tablet 4 mg  4 mg Oral Q6H PRN Reyne Dumas, MD       Or  . ondansetron (ZOFRAN)  injection 4 mg  4 mg Intravenous Q6H PRN Reyne Dumas, MD      . pantoprazole (PROTONIX) EC tablet 40 mg  40 mg Oral Q0600 Vena Rua, PA-C   40 mg at 05/31/16 0631  . rifaximin (XIFAXAN) tablet 400 mg  400 mg Oral TID Reyne Dumas, MD   400 mg at 05/30/16 2222     Discharge Medications: Please see discharge summary for a list of discharge medications.  Relevant Imaging Results:  Relevant Lab Results:   Additional Information SS#: [id number]  Samule Dry, LCSW

## 2016-06-01 DIAGNOSIS — I509 Heart failure, unspecified: Secondary | ICD-10-CM

## 2016-06-01 LAB — BASIC METABOLIC PANEL
ANION GAP: 12 (ref 5–15)
BUN: 77 mg/dL — ABNORMAL HIGH (ref 6–20)
CALCIUM: 8 mg/dL — AB (ref 8.9–10.3)
CO2: 26 mmol/L (ref 22–32)
Chloride: 97 mmol/L — ABNORMAL LOW (ref 101–111)
Creatinine, Ser: 2.54 mg/dL — ABNORMAL HIGH (ref 0.44–1.00)
GFR, EST AFRICAN AMERICAN: 20 mL/min — AB (ref >60)
GFR, EST NON AFRICAN AMERICAN: 17 mL/min — AB (ref >60)
Glucose, Bld: 157 mg/dL — ABNORMAL HIGH (ref 65–99)
POTASSIUM: 3 mmol/L — AB (ref 3.5–5.1)
SODIUM: 135 mmol/L (ref 135–145)

## 2016-06-01 LAB — CBC
HCT: 30.8 % — ABNORMAL LOW (ref 36.0–46.0)
Hemoglobin: 9.3 g/dL — ABNORMAL LOW (ref 12.0–15.0)
MCH: 25.9 pg — ABNORMAL LOW (ref 26.0–34.0)
MCHC: 30.2 g/dL (ref 30.0–36.0)
MCV: 85.8 fL (ref 78.0–100.0)
PLATELETS: 225 10*3/uL (ref 150–400)
RBC: 3.59 MIL/uL — AB (ref 3.87–5.11)
RDW: 18 % — AB (ref 11.5–15.5)
WBC: 7 10*3/uL (ref 4.0–10.5)

## 2016-06-01 LAB — PHOSPHORUS: PHOSPHORUS: 3.3 mg/dL (ref 2.5–4.6)

## 2016-06-01 LAB — GLUCOSE, CAPILLARY
GLUCOSE-CAPILLARY: 161 mg/dL — AB (ref 65–99)
GLUCOSE-CAPILLARY: 188 mg/dL — AB (ref 65–99)
Glucose-Capillary: 198 mg/dL — ABNORMAL HIGH (ref 65–99)
Glucose-Capillary: 164 mg/dL — ABNORMAL HIGH (ref 65–99)

## 2016-06-01 LAB — AMMONIA: AMMONIA: 61 umol/L — AB (ref 9–35)

## 2016-06-01 LAB — MAGNESIUM: MAGNESIUM: 1.4 mg/dL — AB (ref 1.7–2.4)

## 2016-06-01 MED ORDER — LORAZEPAM 0.5 MG PO TABS
0.5000 mg | ORAL_TABLET | Freq: Once | ORAL | Status: AC
  Administered 2016-06-01: 0.5 mg via ORAL
  Filled 2016-06-01: qty 1

## 2016-06-01 MED ORDER — FUROSEMIDE 80 MG PO TABS
120.0000 mg | ORAL_TABLET | Freq: Two times a day (BID) | ORAL | Status: DC
  Administered 2016-06-01 – 2016-06-04 (×6): 120 mg via ORAL
  Filled 2016-06-01 (×6): qty 1

## 2016-06-01 MED ORDER — POTASSIUM CHLORIDE CRYS ER 20 MEQ PO TBCR
40.0000 meq | EXTENDED_RELEASE_TABLET | Freq: Four times a day (QID) | ORAL | Status: AC
  Administered 2016-06-01 (×2): 40 meq via ORAL
  Filled 2016-06-01 (×2): qty 2

## 2016-06-01 MED ORDER — MAGNESIUM SULFATE 2 GM/50ML IV SOLN
2.0000 g | Freq: Once | INTRAVENOUS | Status: AC
  Administered 2016-06-01: 2 g via INTRAVENOUS
  Filled 2016-06-01 (×2): qty 50

## 2016-06-01 MED ORDER — LACTULOSE 10 GM/15ML PO SOLN
30.0000 g | Freq: Once | ORAL | Status: AC
  Administered 2016-06-01: 30 g via ORAL
  Filled 2016-06-01: qty 45

## 2016-06-01 MED ORDER — POTASSIUM CHLORIDE 20 MEQ PO PACK
20.0000 meq | PACK | Freq: Every day | ORAL | Status: DC
  Filled 2016-06-01: qty 1

## 2016-06-01 MED ORDER — LACTULOSE 10 GM/15ML PO SOLN
20.0000 g | Freq: Three times a day (TID) | ORAL | Status: DC
  Administered 2016-06-01 – 2016-06-02 (×5): 20 g via ORAL
  Filled 2016-06-01 (×5): qty 30

## 2016-06-01 NOTE — Progress Notes (Signed)
PROGRESS NOTE    Nesha Sillah  [id number] DOB: 1940/12/16 DOA: 05/24/2016 PCP: Marco Collie, MD   Brief Narrative:  75 year old with history of  Chronic Atrial fibrillation on Coumadin, Chronic Systolic CHF  with EF 0000000, Chronic anemia, CKD IV-V, HLD, DM Type 2, CAD native artery S/P CEA and CAD s/p 4v CABG 1993   Presented with melena and a hemoglobin of 6.2. recent hospitalization in [facility] ( 8/5 > 8/18) for hypokalemia, acute on chronic systolic and right-sided heart failure, and discharged on Lasix 120 mg twice a day. Patient also had acute kidney injury, that slowly improved. Patient was also treated for hepatic encephalopathy and discharged on lactulose and rifaximin. 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 She underwent liver biopsy on 8/16, awaiting pathology and was supposed to follow-up with [facility]. Patient also has atrial fibrillation and was discharged on Lovenox and Coumadin.    Patient was discharged to SNF on 8/18 were she stayed about 8-10 days prior to being discharged home with husband. Patient supposedly had a supratherapeutic INR few days back. Husband noted that the patient developed melena about a week ago. Patient continued to get weaker, decreased by mouth intake, nauseous and more lethargic. Brought into ER at [facility] and she was found to have INR of 3.7. She was given vitamin K and 2 units of fresh frozen plasma and 1 unit of blood and transferred to [facility] cone for further evaluation. Hemoglobin upon presentation was 6.2. Patient was also found to have ammonia level of 107, and was given 300 mg per rectum of lactulose enema. Lactic acid was 8.1, troponin 0.10.  Patient transferred to [facility] cone for further evaluation   Subjective: No significant complaints, reports the area overnight, there was afebrile, and his abdomen or chest pain  Assessment & Plan:   Principal Problem:   GIB (gastrointestinal bleeding) Active Problems:   Chronic  atrial fibrillation (HCC)   Type 2 diabetes mellitus with stage 4 chronic kidney disease, with long-term current use of insulin (HCC)   CKD (chronic kidney disease), stage IV (HCC)   Encephalopathy, hepatic (HCC)   GI bleed   Blood in stool   Cryptogenic cirrhosis of liver (HCC)   Pulmonary hypertension ([place])   Uncontrolled type 2 diabetes mellitus with complication (HCC)   Palliative care encounter   Goals of care, counseling/discussion   GIB - Acute hematochezia, melena  -anticoagulation reversed with vitamin & FF - 9/12 Colonoscopy show multiple polyps - Capsule study: Normal study - Per GI note believe secondary to AVMs not seen on capsule study, recommend holding Coumadin. - Schedule Follow up with Dr. Gatha [facility] in 2-3 weeks for GI bleed  Multiple colon polyps - All polyps removed sent for pathology.  Coagulopathy  -INR 3.7 at presentation. Reversed with vitamin & FFP, in the setting of liver disease and spontaneous GI bleeding and does not appear that ongoing use of anticoagulation is safe in this patient.   Recent Labs Lab 05/26/16 0320 05/29/16 0415 05/30/16 0859  INR 1.53 1.43 1.30    Cryptogenic Liver cirrhosis w/ Hepatic encephalopathy -Ceftriaxone for SBP prophylaxis discontinued. Patient was not on medication prior to admission no indication to continue. - encephalopathy worsened by upper GI bleed contributing to severe uremia - Rifaximin 400 mg TID - Palliative care consult appreciated, remains full code - Patient with a gradually increasing ammonia level, so we'll increase her lactulose.  Acute encephalopathy - Due to hepatic encephalopathy plus uremia  -  Negative MRI brain  - Ammonia level continues to increase, so will increase lactulose.  CKD Stage 4/5 -Nephrology following  Lab Results  Component Value Date   CREATININE 2.54 (H) 06/01/2016   CREATININE 2.86 (H) 05/30/2016   CREATININE 2.90 (H) 05/29/2016  -Nephrology has signed  off. Schedule follow-up for 1-2 weeks with Dr Madelon Lips [facility] CKD stage 4/5  Chronic Afib -Rate controlled  - not safe for anticoag at this time secondary to multiple GI bleed. - 9/14 currently in NSR - Upon discharge weight 1 week and then start  full dose aspirin.  Acute on chronic systolic CHF -EF [id number] via TTE 9/10, improved from 35% August 2017 - follow daily weights Filed Weights   05/29/16 0500 05/30/16 0521 06/01/16 0441  Weight: 73.1 kg (161 lb 2.5 oz) 69.9 kg (154 lb) 71.9 kg (158 lb 8 oz)  - no ACEi/ARB for obvious reasons  - on  Toprol 25 mg daily (home dose 50 mg)  - Schedule follow-up appointment for 1-2 weeks with [facility] acute on chronic systolic CHF with chronic A. fib, unable to be anticoagulated secondary to multiple GI bleed - Will increase patient's Lasix today from 80 by mouth twice a day to 120 mg by mouth twice a day given she is +500 mL yesterday.  Severe Pulmonary Hypertension - See CHF  Hypokalemia/hypomagnesemia - Potassium goal > 4   Elevated troponin - TTE noted improvement in ejection fraction from 35% > 45% w/ no significant wall motion abnormality  DM Type 2 uncontrolled with complication - 9/8 hemoglobin A1c= 7.4 - Moderate SSI, and Lantus  Gout Quiescent    DVT prophylaxis: SCD Code Status: Full  Family Communication: None Disposition Plan: SNF   Consultants:  Dr Renelda Loma Armbruster. GI [facility]    Procedures/Significant Events:  9/10 Echocardiogram:Left ventricle: mild LVH. -LVEF=  40% to 45%. -  Mitral valve:mild to moderate regurgitation. - Left atrium: moderately dilated. - Right atrium: mildly dilated. - Tricuspid valve: severe regurgitation. - Pulmonary arteries: PA peak pressure: 75 mm Hg (S). 9/12 Colonoscopy: -Diverticulosis in the sigmoid colon.  - Three 4 to 5 mm polyps in the ascending colo, -- Two 5 to 6 mm polyps in the transverse  colon,  -  One 10 mm polyp in the transverse colon, - One 4 mm polyp in the descending colon, -- No clear etiology for iron deficiency and GI Bleeding on this exam, which I suspect is small bowel in etiology.    Cultures 9/12 all polyps removed: TUBULAR ADENOMA(S).- HIGH GRADE DYSPLASIA IS NOT IDENTIFIED.    Antimicrobials: Rocephin 9/7 > 9/14   Objective: Vitals:   05/31/16 1317 05/31/16 1946 06/01/16 0415 06/01/16 0441  BP: 125/75 (!) 127/51 138/84   Pulse: 71 68 71   Resp: 18 18    Temp: 97.7 F (36.5 C) 98.4 F (36.9 C) 97.9 F (36.6 C)   TempSrc: Oral Oral Oral   SpO2: 99% 100% 100%   Weight:    71.9 kg (158 lb 8 oz)  Height:        Intake/Output Summary (Last 24 hours) at 06/01/16 1210 Last data filed at 06/01/16 0900  Gross per 24 hour  Intake              360 ml  Output                0 ml  Net  360 ml   Filed Weights   05/29/16 0500 05/30/16 0521 06/01/16 0441  Weight: 73.1 kg (161 lb 2.5 oz) 69.9 kg (154 lb) 71.9 kg (158 lb 8 oz)    Examination:  Awake alert 2, pleasent, communicative Supple neck, no JVD Good air entry bilaterally, no wheezing rales or rhonchi RRR, murmur or gallops Abdomen soft, nontender, nondistended, bowel sounds present, no ascites appreciated Images, no edema, no clubbing, no cyanosis      Data Reviewed: Care during the described time interval was provided by me .  I have reviewed this patient's available data, including medical history, events of note, physical examination, and all test results as part of my evaluation.  CBC:  Recent Labs Lab 05/25/16 1700 05/26/16 0320 05/27/16 0700 05/28/16 0313 05/29/16 0415 05/30/16 0859 06/01/16 0350  WBC 7.6 7.8 7.0 6.8  --  6.2 7.0  NEUTROABS 5.9 5.9  --   --   --   --   --   HGB 8.5* 8.2* 8.5* 8.6* 9.1* 9.6* 9.3*  HCT 26.6* 25.8* 27.5* 28.2* 29.6* 31.4* 30.8*  MCV 82.9 84.0 85.9 84.4  --  87.2 85.8  PLT 191 169 201 204  --  215 [id number]   Basic Metabolic  Panel:  Recent Labs Lab 05/26/16 0320  05/27/16 0700 05/28/16 0313 05/29/16 0415 05/30/16 0859 06/01/16 0350 06/01/16 0821  NA 136  < > 133* 133* 133* 136 135  --   K 2.8*  < > 3.4* 3.1* 3.3* 3.3* 3.0*  --   CL 95*  < > 93* 94* 94* 97* 97*  --   CO2 27  < > 26 27 27 27 26   --   GLUCOSE 136*  < > 316* 79 116* 149* 157*  --   BUN 125*  < > 113* 98* 90* 91* 77*  --   CREATININE 3.48*  < > 3.42* 3.11* 2.90* 2.86* 2.54*  --   CALCIUM 7.5*  < > 7.7* 8.0* 8.0* 8.2* 8.0*  --   MG 1.8  --   --  1.5* 1.9 1.7  --  1.4*  PHOS 5.9*  --  5.0* 4.2 3.8  --   --  3.3  < > = values in this interval not displayed. GFR: Estimated Creatinine Clearance: 17.8 mL/min (by C-G formula based on SCr of 2.54 mg/dL (H)). Liver Function Tests:  Recent Labs Lab 05/26/16 0320 05/27/16 0700 05/28/16 0313 05/29/16 0415 05/30/16 0859  AST 56*  --   --   --  34  ALT 30  --   --   --  20  ALKPHOS 169*  --   --   --  160*  BILITOT 1.2  --   --   --  0.7  PROT 6.3*  --   --   --  6.5  ALBUMIN 2.5* 2.7* 2.7* 2.6* 2.7*   No results for input(s): LIPASE, AMYLASE in the last 168 hours.  Recent Labs Lab 05/26/16 2100 05/29/16 0415 05/30/16 0859 06/01/16 0821  AMMONIA 41* 23 56* 61*   Coagulation Profile:  Recent Labs Lab 05/26/16 0320 05/29/16 0415 05/30/16 0859  INR 1.53 1.43 1.30   Cardiac Enzymes: No results for input(s): CKTOTAL, CKMB, CKMBINDEX, TROPONINI in the last 168 hours. BNP (last 3 results) No results for input(s): PROBNP in the last 8760 hours. HbA1C: No results for input(s): HGBA1C in the last 72 hours. CBG:  Recent Labs Lab 05/31/16 1117 05/31/16 1737 05/31/16 2124 06/01/16 [id number] 06/01/16 1132  GLUCAP 207* 246* 140* 161* 198*   Lipid Profile: No results for input(s): CHOL, HDL, LDLCALC, TRIG, CHOLHDL, LDLDIRECT in the last 72 hours. Thyroid Function Tests: No results for input(s): TSH, T4TOTAL, FREET4, T3FREE, THYROIDAB in the last 72 hours. Anemia Panel: No results  for input(s): VITAMINB12, FOLATE, FERRITIN, TIBC, IRON, RETICCTPCT in the last 72 hours. Urine analysis:    Component Value Date/Time   COLORURINE YELLOW 05/25/2016 [place] 05/25/2016 1725   LABSPEC 1.014 05/25/2016 1725   PHURINE 6.0 05/25/2016 1725   GLUCOSEU NEGATIVE 05/25/2016 1725   HGBUR NEGATIVE 05/25/2016 1725   BILIRUBINUR NEGATIVE 05/25/2016 1725   KETONESUR NEGATIVE 05/25/2016 1725   PROTEINUR NEGATIVE 05/25/2016 1725   NITRITE NEGATIVE 05/25/2016 1725   LEUKOCYTESUR NEGATIVE 05/25/2016 1725   Sepsis Labs: @LABRCNTIP (procalcitonin:4,lacticidven:4)  ) Recent Results (from the past 240 hour(s))  MRSA PCR Screening     Status: None   Collection Time: 05/24/16  6:56 AM  Result Value Ref Range Status   MRSA by PCR NEGATIVE NEGATIVE Final    Comment:        The GeneXpert MRSA Assay (FDA approved for NASAL specimens only), is one component of a comprehensive MRSA colonization surveillance program. It is not intended to diagnose MRSA infection nor to guide or monitor treatment for MRSA infections.          Radiology Studies: No results found.      Scheduled Meds: . camphor-menthol   Topical BID  . cholestyramine light  4 g Oral Q12H  . darbepoetin (ARANESP) injection - NON-DIALYSIS  60 mcg Subcutaneous Q Sun-1800  . furosemide  120 mg Oral BID  . insulin aspart  0-15 Units Subcutaneous TID WC  . insulin glargine  5 Units Subcutaneous Daily  . lactulose  20 g Oral TID  . lactulose  30 g Oral Once  . magnesium sulfate 1 - 4 g bolus IVPB  2 g Intravenous Once  . metoprolol succinate  25 mg Oral Daily  . pantoprazole  40 mg Oral Q0600  . [START ON 06/02/2016] potassium chloride  20 mEq Oral Daily  . potassium chloride  40 mEq Oral Q6H  . rifaximin  400 mg Oral TID   Continuous Infusions:    LOS: 8 days      Amadou Katzenstein, MD [facility] Pager [phone]  If 7PM-7AM, please contact  night-coverage [email or web address] Password [email or web address] 06/01/2016, 12:10 PM

## 2016-06-01 NOTE — Progress Notes (Signed)
Pt tolerated right IJ removal without complications, pt vitals WDL, pt's HOB is flat at this time, pt call bell within reach and bed in lowest position with bed alarm in use, RN will continue to monitor.       06/01/2016 Izola Price, RN

## 2016-06-02 LAB — AMMONIA
AMMONIA: 54 umol/L — AB (ref 9–35)
AMMONIA: 82 umol/L — AB (ref 9–35)

## 2016-06-02 LAB — CBC
HEMATOCRIT: 30.7 % — AB (ref 36.0–46.0)
HEMOGLOBIN: 9.2 g/dL — AB (ref 12.0–15.0)
MCH: 25.8 pg — ABNORMAL LOW (ref 26.0–34.0)
MCHC: 30 g/dL (ref 30.0–36.0)
MCV: 86.2 fL (ref 78.0–100.0)
Platelets: 213 10*3/uL (ref 150–400)
RBC: 3.56 MIL/uL — AB (ref 3.87–5.11)
RDW: 18.5 % — ABNORMAL HIGH (ref 11.5–15.5)
WBC: 6.8 10*3/uL (ref 4.0–10.5)

## 2016-06-02 LAB — GLUCOSE, CAPILLARY
GLUCOSE-CAPILLARY: 150 mg/dL — AB (ref 65–99)
GLUCOSE-CAPILLARY: 194 mg/dL — AB (ref 65–99)
Glucose-Capillary: 116 mg/dL — ABNORMAL HIGH (ref 65–99)
Glucose-Capillary: 150 mg/dL — ABNORMAL HIGH (ref 65–99)

## 2016-06-02 LAB — BASIC METABOLIC PANEL
ANION GAP: 13 (ref 5–15)
BUN: 77 mg/dL — ABNORMAL HIGH (ref 6–20)
CO2: 22 mmol/L (ref 22–32)
Calcium: 8 mg/dL — ABNORMAL LOW (ref 8.9–10.3)
Chloride: 100 mmol/L — ABNORMAL LOW (ref 101–111)
Creatinine, Ser: 2.59 mg/dL — ABNORMAL HIGH (ref 0.44–1.00)
GFR calc Af Amer: 20 mL/min — ABNORMAL LOW (ref >60)
GFR, EST NON AFRICAN AMERICAN: 17 mL/min — AB (ref >60)
GLUCOSE: 126 mg/dL — AB (ref 65–99)
POTASSIUM: 3.7 mmol/L (ref 3.5–5.1)
Sodium: 135 mmol/L (ref 135–145)

## 2016-06-02 LAB — MAGNESIUM: Magnesium: 1.8 mg/dL (ref 1.7–2.4)

## 2016-06-02 MED ORDER — POTASSIUM CHLORIDE CRYS ER 20 MEQ PO TBCR
20.0000 meq | EXTENDED_RELEASE_TABLET | Freq: Once | ORAL | Status: AC
  Administered 2016-06-02: 20 meq via ORAL
  Filled 2016-06-02: qty 1

## 2016-06-02 MED ORDER — POTASSIUM CHLORIDE CRYS ER 20 MEQ PO TBCR
20.0000 meq | EXTENDED_RELEASE_TABLET | Freq: Every day | ORAL | Status: DC
  Administered 2016-06-02: 20 meq via ORAL
  Filled 2016-06-02: qty 1

## 2016-06-02 MED ORDER — POTASSIUM CHLORIDE CRYS ER 20 MEQ PO TBCR
40.0000 meq | EXTENDED_RELEASE_TABLET | Freq: Every day | ORAL | Status: DC
  Administered 2016-06-03 – 2016-06-04 (×2): 40 meq via ORAL
  Filled 2016-06-02 (×2): qty 2

## 2016-06-02 NOTE — Progress Notes (Signed)
PROGRESS NOTE    Cassandra Bonilla  [id number] DOB: 1941-03-25 DOA: 05/24/2016 PCP: Marco Collie, MD   Brief Narrative:  75 year old with history of  Chronic Atrial fibrillation on Coumadin, Chronic Systolic CHF  with EF 0000000, Chronic anemia, CKD IV-V, HLD, DM Type 2, CAD native artery S/P CEA and CAD s/p 4v CABG 1993   Presented with melena and a hemoglobin of 6.2. recent hospitalization in [facility] ( 8/5 > 8/18) for hypokalemia, acute on chronic systolic and right-sided heart failure, and discharged on Lasix 120 mg twice a day. Patient also had acute kidney injury, that slowly improved. Patient was also treated for hepatic encephalopathy and discharged on lactulose and rifaximin. 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 She underwent liver biopsy on 8/16, awaiting pathology and was supposed to follow-up with [facility]. Patient also has atrial fibrillation and was discharged on Lovenox and Coumadin.    Patient was discharged to SNF on 8/18 were she stayed about 8-10 days prior to being discharged home with husband. Patient supposedly had a supratherapeutic INR few days back. Husband noted that the patient developed melena about a week ago. Patient continued to get weaker, decreased by mouth intake, nauseous and more lethargic. Brought into ER at [facility] and she was found to have INR of 3.7. She was given vitamin K and 2 units of fresh frozen plasma and 1 unit of blood and transferred to [facility] for further evaluation. Hemoglobin upon presentation was 6.2. Patient was also found to have ammonia level of 107, and was given 300 mg per rectum of lactulose enema. Lactic acid was 8.1, troponin 0.10.  Patient transferred to [facility] cone for further evaluation   Subjective: No significant complaints, reports the area overnight, there was afebrile, and his abdomen or chest pain  Assessment & Plan:   Principal Problem:   GIB (gastrointestinal bleeding) Active Problems:   Chronic  atrial fibrillation (HCC)   Type 2 diabetes mellitus with stage 4 chronic kidney disease, with long-term current use of insulin (HCC)   CKD (chronic kidney disease), stage IV (HCC)   Encephalopathy, hepatic (HCC)   GI bleed   Blood in stool   Cryptogenic cirrhosis of liver (HCC)   Pulmonary hypertension ([place])   Uncontrolled type 2 diabetes mellitus with complication (HCC)   Palliative care encounter   Goals of care, counseling/discussion   GIB - Acute hematochezia, melena  - anticoagulation reversed with vitamin & FF - 9/12 Colonoscopy show multiple polyps - Capsule study: Normal study - Per GI note believe secondary to AVMs not seen on capsule study, recommend holding Coumadin. - Schedule Follow up with Dr. Gatha Mayer GI in 2-3 weeks for GI bleed  Multiple colon polyps - All polyps removed sent for pathology.  Coagulopathy  -INR 3.7 at presentation. Reversed with vitamin & FFP, in the setting of liver disease and spontaneous GI bleeding and does not appear that ongoing use of anticoagulation is safe in this patient.   Recent Labs Lab 05/29/16 0415 05/30/16 0859  INR 1.43 1.30    Cryptogenic Liver cirrhosis w/ Hepatic encephalopathy -Ceftriaxone for SBP prophylaxis discontinued. Patient was not on medication prior to admission no indication to continue. - encephalopathy worsened by upper GI bleed contributing to severe uremia - Rifaximin 400 mg TID - Palliative care consult appreciated, remains full code - Patient with Elevated ammonia level, increase her lactulose yesterday with good bowel movements overnight, Mr. lactulose still elevated, will continue current dose we'll reassess tomorrow, if  trending down.  Acute encephalopathy - Due to hepatic encephalopathy plus uremia  - Negative MRI brain  - Ammonia level continues to increase, so will increase lactulose.  CKD Stage 4/5 -Nephrology following  Lab Results  Component Value Date   CREATININE 2.59 (H)  06/02/2016   CREATININE 2.54 (H) 06/01/2016   CREATININE 2.86 (H) 05/30/2016  -Nephrology has signed off. Schedule follow-up for 1-2 weeks with Dr Madelon Lips [facility] CKD stage 4/5  Chronic Afib -Rate controlled  - not safe for anticoag at this time secondary to multiple GI bleed. - 9/14 currently in NSR - Upon discharge weight 1 week and then start  full dose aspirin.  Acute on chronic systolic CHF -EF [id number] via TTE 9/10, improved from 35% August 2017 - follow daily weights Filed Weights   05/30/16 0521 06/01/16 0441 06/02/16 0446  Weight: 69.9 kg (154 lb) 71.9 kg (158 lb 8 oz) 70 kg (154 lb 6.4 oz)  - no ACEi/ARB for obvious reasons  - on  Toprol 25 mg daily (home dose 50 mg)  - Schedule follow-up appointment for 1-2 weeks with [facility] acute on chronic systolic CHF with chronic A. fib, unable to be anticoagulated secondary to multiple GI bleed - Will increase patient's Lasix today from 80 by mouth twice a day to 120 mg by mouth twice a day given she is +500 mL yesterday.  Severe Pulmonary Hypertension - See CHF  Hypokalemia/hypomagnesemia - Potassium goal > 4   Elevated troponin - TTE noted improvement in ejection fraction from 35% > 45% w/ no significant wall motion abnormality  DM Type 2 uncontrolled with complication - 9/8 hemoglobin A1c= 7.4 - Moderate SSI, and Lantus  Gout Quiescent    DVT prophylaxis: SCD Code Status: Full  Family Communication: Scheduled husband via phone 9/17 Disposition Plan: Discharge home with home care tomorrow if ammonia level trending down   Consultants:  Dr Renelda Loma Armbruster. [facility]    Procedures/Significant Events:  9/10 Echocardiogram:Left ventricle: mild LVH. -LVEF=  40% to 45%. -  Mitral valve:mild to moderate regurgitation. - Left atrium: moderately dilated. - Right atrium: mildly dilated. - Tricuspid valve: severe regurgitation. -  Pulmonary arteries: PA peak pressure: 75 mm Hg (S). 9/12 Colonoscopy: -Diverticulosis in the sigmoid colon.  - Three 4 to 5 mm polyps in the ascending colo, -- Two 5 to 6 mm polyps in the transverse colon,  -  One 10 mm polyp in the transverse colon, - One 4 mm polyp in the descending colon, -- No clear etiology for iron deficiency and GI Bleeding on this exam, which I suspect is small bowel in etiology.    Cultures 9/12 all polyps removed: TUBULAR ADENOMA(S).- HIGH GRADE DYSPLASIA IS NOT IDENTIFIED.    Antimicrobials: Rocephin 9/7 > 9/14   Objective: Vitals:   06/01/16 0441 06/01/16 1434 06/01/16 1900 06/02/16 0446  BP:  (!) 125/52 131/69 114/66  Pulse:  66 99 70  Resp:  18 18 17   Temp:  97.8 F (36.6 C) 98.8 F (37.1 C) 97.8 F (36.6 C)  TempSrc:  Oral Oral Oral  SpO2:  100% 100% 100%  Weight: 71.9 kg (158 lb 8 oz)   70 kg (154 lb 6.4 oz)  Height:        Intake/Output Summary (Last 24 hours) at 06/02/16 1046 Last data filed at 06/02/16 0446  Gross per 24 hour  Intake  1370 ml  Output              251 ml  Net             1119 ml   Filed Weights   05/30/16 0521 06/01/16 0441 06/02/16 0446  Weight: 69.9 kg (154 lb) 71.9 kg (158 lb 8 oz) 70 kg (154 lb 6.4 oz)    Examination:  Awake alert 2, pleasent, communicative Supple neck, no JVD Good air entry bilaterally, no wheezing rales or rhonchi RRR, murmur or gallops Abdomen soft, nontender, nondistended, bowel sounds present, no ascites appreciated Images, no edema, no clubbing, no cyanosis      Data Reviewed: Care during the described time interval was provided by me .  I have reviewed this patient's available data, including medical history, events of note, physical examination, and all test results as part of my evaluation.  CBC:  Recent Labs Lab 05/27/16 0700 05/28/16 0313 05/29/16 0415 05/30/16 0859 06/01/16 0350 06/02/16 0327  WBC 7.0 6.8  --  6.2 7.0 6.8  HGB 8.5* 8.6* 9.1* 9.6* 9.3*  9.2*  HCT 27.5* 28.2* 29.6* 31.4* 30.8* 30.7*  MCV 85.9 84.4  --  87.2 85.8 86.2  PLT 201 204  --  215 225 123456   Basic Metabolic Panel:  Recent Labs Lab 05/27/16 0700 05/28/16 0313 05/29/16 0415 05/30/16 0859 06/01/16 0350 06/01/16 0821 06/02/16 0327  NA 133* 133* 133* 136 135  --  135  K 3.4* 3.1* 3.3* 3.3* 3.0*  --  3.7  CL 93* 94* 94* 97* 97*  --  100*  CO2 26 27 27 27 26   --  22  GLUCOSE 316* 79 116* 149* 157*  --  126*  BUN 113* 98* 90* 91* 77*  --  77*  CREATININE 3.42* 3.11* 2.90* 2.86* 2.54*  --  2.59*  CALCIUM 7.7* 8.0* 8.0* 8.2* 8.0*  --  8.0*  MG  --  1.5* 1.9 1.7  --  1.4* 1.8  PHOS 5.0* 4.2 3.8  --   --  3.3  --    GFR: Estimated Creatinine Clearance: 17.2 mL/min (by C-G formula based on SCr of 2.59 mg/dL (H)). Liver Function Tests:  Recent Labs Lab 05/27/16 0700 05/28/16 0313 05/29/16 0415 05/30/16 0859  AST  --   --   --  34  ALT  --   --   --  20  ALKPHOS  --   --   --  160*  BILITOT  --   --   --  0.7  PROT  --   --   --  6.5  ALBUMIN 2.7* 2.7* 2.6* 2.7*   No results for input(s): LIPASE, AMYLASE in the last 168 hours.  Recent Labs Lab 05/26/16 2100 05/29/16 0415 05/30/16 0859 06/01/16 0821 06/02/16 0729  AMMONIA 41* 23 56* 61* 54*   Coagulation Profile:  Recent Labs Lab 05/29/16 0415 05/30/16 0859  INR 1.43 1.30   Cardiac Enzymes: No results for input(s): CKTOTAL, CKMB, CKMBINDEX, TROPONINI in the last 168 hours. BNP (last 3 results) No results for input(s): PROBNP in the last 8760 hours. HbA1C: No results for input(s): HGBA1C in the last 72 hours. CBG:  Recent Labs Lab 06/01/16 0614 06/01/16 1132 06/01/16 1625 06/01/16 2106 06/02/16 0632  GLUCAP 161* 198* 188* 164* 116*   Lipid Profile: No results for input(s): CHOL, HDL, LDLCALC, TRIG, CHOLHDL, LDLDIRECT in the last 72 hours. Thyroid Function Tests: No results for input(s): TSH, T4TOTAL, FREET4, T3FREE, THYROIDAB  in the last 72 hours. Anemia Panel: No results for  input(s): VITAMINB12, FOLATE, FERRITIN, TIBC, IRON, RETICCTPCT in the last 72 hours. Urine analysis:    Component Value Date/Time   COLORURINE YELLOW 05/25/2016 [place] 05/25/2016 1725   LABSPEC 1.014 05/25/2016 1725   PHURINE 6.0 05/25/2016 1725   GLUCOSEU NEGATIVE 05/25/2016 1725   HGBUR NEGATIVE 05/25/2016 1725   BILIRUBINUR NEGATIVE 05/25/2016 1725   KETONESUR NEGATIVE 05/25/2016 1725   PROTEINUR NEGATIVE 05/25/2016 1725   NITRITE NEGATIVE 05/25/2016 1725   LEUKOCYTESUR NEGATIVE 05/25/2016 1725   Sepsis Labs: @[email or web address] (procalcitonin:4,lacticidven:4)  ) Recent Results (from the past 240 hour(s))  MRSA PCR Screening     Status: None   Collection Time: 05/24/16  6:56 AM  Result Value Ref Range Status   MRSA by PCR NEGATIVE NEGATIVE Final    Comment:        The GeneXpert MRSA Assay (FDA approved for NASAL specimens only), is one component of a comprehensive MRSA colonization surveillance program. It is not intended to diagnose MRSA infection nor to guide or monitor treatment for MRSA infections.          Radiology Studies: No results found.      Scheduled Meds: . camphor-menthol   Topical BID  . cholestyramine light  4 g Oral Q12H  . darbepoetin (ARANESP) injection - NON-DIALYSIS  60 mcg Subcutaneous Q Sun-1800  . furosemide  120 mg Oral BID  . insulin aspart  0-15 Units Subcutaneous TID WC  . insulin glargine  5 Units Subcutaneous Daily  . lactulose  20 g Oral TID  . metoprolol succinate  25 mg Oral Daily  . pantoprazole  40 mg Oral Q0600  . potassium chloride  20 mEq Oral Once  . [START ON 06/03/2016] potassium chloride SA  40 mEq Oral Daily  . rifaximin  400 mg Oral TID   Continuous Infusions:    LOS: 9 days      Layli Capshaw, MD [facility] Pager [phone]  If 7PM-7AM, please contact night-coverage [email or web address] Password [email or web address] 06/02/2016, 10:46 AM

## 2016-06-03 LAB — CBC
HCT: 29.5 % — ABNORMAL LOW (ref 36.0–46.0)
Hemoglobin: 8.8 g/dL — ABNORMAL LOW (ref 12.0–15.0)
MCH: 25.8 pg — AB (ref 26.0–34.0)
MCHC: 29.8 g/dL — ABNORMAL LOW (ref 30.0–36.0)
MCV: 86.5 fL (ref 78.0–100.0)
PLATELETS: 212 10*3/uL (ref 150–400)
RBC: 3.41 MIL/uL — ABNORMAL LOW (ref 3.87–5.11)
RDW: 18.3 % — AB (ref 11.5–15.5)
WBC: 6.6 10*3/uL (ref 4.0–10.5)

## 2016-06-03 LAB — BASIC METABOLIC PANEL
Anion gap: 11 (ref 5–15)
BUN: 74 mg/dL — AB (ref 6–20)
CALCIUM: 7.9 mg/dL — AB (ref 8.9–10.3)
CO2: 23 mmol/L (ref 22–32)
CREATININE: 2.66 mg/dL — AB (ref 0.44–1.00)
Chloride: 101 mmol/L (ref 101–111)
GFR calc Af Amer: 19 mL/min — ABNORMAL LOW (ref >60)
GFR, EST NON AFRICAN AMERICAN: 16 mL/min — AB (ref >60)
GLUCOSE: 132 mg/dL — AB (ref 65–99)
Potassium: 3.9 mmol/L (ref 3.5–5.1)
Sodium: 135 mmol/L (ref 135–145)

## 2016-06-03 LAB — GLUCOSE, CAPILLARY
GLUCOSE-CAPILLARY: 115 mg/dL — AB (ref 65–99)
Glucose-Capillary: 120 mg/dL — ABNORMAL HIGH (ref 65–99)
Glucose-Capillary: 184 mg/dL — ABNORMAL HIGH (ref 65–99)
Glucose-Capillary: 216 mg/dL — ABNORMAL HIGH (ref 65–99)

## 2016-06-03 LAB — AMMONIA: Ammonia: 47 umol/L — ABNORMAL HIGH (ref 9–35)

## 2016-06-03 MED ORDER — LACTULOSE 10 GM/15ML PO SOLN
30.0000 g | Freq: Three times a day (TID) | ORAL | Status: DC
  Administered 2016-06-03 (×2): 20 g via ORAL
  Administered 2016-06-03 – 2016-06-04 (×2): 30 g via ORAL
  Filled 2016-06-03 (×4): qty 45

## 2016-06-03 MED ORDER — FUROSEMIDE 10 MG/ML IJ SOLN: 40.0000 mg | Freq: Once | INTRAMUSCULAR | Status: DC

## 2016-06-03 MED ORDER — METOLAZONE 2.5 MG PO TABS
2.5000 mg | ORAL_TABLET | Freq: Once | ORAL | Status: AC
  Administered 2016-06-03: 2.5 mg via ORAL
  Filled 2016-06-03: qty 1

## 2016-06-03 NOTE — Progress Notes (Signed)
Physical Therapy Treatment Patient Details Name: Cassandra Bonilla MRN: [id number] DOB: [birth year] Today's Date: 06/03/2016    History of Present Illness 75 y.o. female admitted to [facility] on 05/24/16 for nausea, decreased po intake and lethargy.  Found to have supratherapeutic INR and was given K+ and 2 units of plasma, 2 units of blood.  Pt underwent EGD with no significant results and GI and Internal medicine following.  Pt dx with acute encepholopathy.  Pt with significant PMHx of recent admission 8/5-8/18/17 for hypokalemia and acute on chronic heart failure.  She d/c to SNF for rehab and then to home.  Pt with other significant PMHx of HTN, gout, CAD, COPD, CKD III, and CABG.      PT Comments    Pt much improved today.  Follow Up Recommendations  Home health PT;Supervision/Assistance - 24 hour (SNF not affordable option)     Equipment Recommendations  None recommended by PT    Recommendations for Other Services       Precautions / Restrictions Precautions Precautions: Fall Restrictions Weight Bearing Restrictions: No    Mobility  Bed Mobility Overal bed mobility: Needs Assistance Bed Mobility: Supine to Sit;Sit to Supine     Supine to sit: Max assist;+2 for physical assistance;HOB elevated Sit to supine: Max assist   General bed mobility comments: Sitting EOB  Transfers Overall transfer level: Needs assistance Equipment used: Rolling walker (2 wheeled) Transfers: Sit to/from [facility] Sit to Stand: Min assist Stand pivot transfers: Min assist       General transfer comment: Assist to bring hips up  Ambulation/Gait Ambulation/Gait assistance: Min assist Ambulation Distance (Feet): 5 Feet Assistive device: Rolling walker (2 wheeled) Gait Pattern/deviations: Step-through pattern;Decreased stride length Gait velocity: decr Gait velocity interpretation: Below normal speed for age/gender General Gait Details: Assist for balance and support   Stairs             Wheelchair Mobility    Modified Rankin (Stroke Patients Only)       Balance Overall balance assessment: Needs assistance Sitting-balance support: No upper extremity supported;Feet supported Sitting balance-Leahy Scale: Fair Sitting balance - Comments: UE assist and supervision   Standing balance support: Bilateral upper extremity supported Standing balance-Leahy Scale: Poor Standing balance comment: Walker and min guard for static standing                    Cognition Arousal/Alertness: Awake/alert Behavior During Therapy: WFL for tasks assessed/performed Overall Cognitive Status: History of cognitive impairments - at baseline Area of Impairment: Memory;Attention;Following commands;Safety/judgement;Problem solving   Current Attention Level: Sustained Memory: Decreased recall of precautions;Decreased short-term memory Following Commands: Follows one step commands inconsistently;Follows one step commands with increased time Safety/Judgement: Decreased awareness of safety;Decreased awareness of deficits   Problem Solving: Slow processing;Decreased initiation;Requires verbal cues;Requires tactile cues;Difficulty sequencing      Exercises      General Comments        Pertinent Vitals/Pain Pain Assessment: No/denies pain Pain Location: c/o itching all over, discomfort    Home Living                      Prior Function            PT Goals (current goals can now be found in the care plan section) Progress towards PT goals: Progressing toward goals    Frequency    Min 3X/week      PT Plan Discharge plan needs to be updated;Current plan remains appropriate  Co-evaluation             End of Session Equipment Utilized During Treatment: Gait belt Activity Tolerance: Patient tolerated treatment well Patient left: with call bell/phone within reach;in bed;with bed alarm set;with family/visitor present     Time:  [id number] PT Time Calculation (min) (ACUTE ONLY): 14 min  Charges:  $Gait Training: 8-22 mins                    G Codes:      Norelle Runnion 06-06-16, 2:37 PM [facility] PT [phone]

## 2016-06-03 NOTE — Progress Notes (Signed)
Occupational Therapy Treatment Patient Details Name: Cassandra Bonilla MRN: [id number] DOB: 04/07/1941 Today's Date: 06/03/2016    History of present illness 75 y.o. female admitted to [facility] on 05/24/16 for nausea, decreased po intake and lethargy.  Found to have supratherapeutic INR and was given K+ and 2 units of plasma, 2 units of blood.  Pt underwent EGD with no significant results and GI and Internal medicine following.  Pt dx with acute encepholopathy.  Pt with significant PMHx of recent admission 8/5-8/18/17 for hypokalemia and acute on chronic heart failure.  She d/c to SNF for rehab and then to home.  Pt with other significant PMHx of HTN, gout, CAD, COPD, CKD III, and CABG.     OT comments  Pt making progress with functional goals, however still reports itching "all over".  Educated pt and her husband on ADL A/E. OT will continue to follow acutely  Follow Up Recommendations  SNF (refusing SNF, HH OT if not going SNF)    Equipment Recommendations  A/E kit, sock aid    Recommendations for Other Services      Precautions / Restrictions Precautions Precautions: Fall Restrictions Weight Bearing Restrictions: No       Mobility Bed Mobility Overal bed mobility: Needs Assistance Bed Mobility: Supine to Sit;Sit to Supine     Supine to sit: Max assist;+2 for physical assistance;HOB elevated Sit to supine: Max assist   General bed mobility comments: sitting EOB upon entering room  Transfers Overall transfer level: Needs assistance Equipment used: Rolling walker (2 wheeled) Transfers: Sit to/from Stand Sit to Stand: Mod assist;Min assist Stand pivot transfers: Mod assist       General transfer comment: poaterior lean against bed, guarded, cues for correct hand placement and to advance LEs towards [facility]    Balance     Sitting balance-Leahy Scale: Fair Sitting balance - Comments: UE assist and supervision     Standing balance-Leahy Scale: Poor                     ADL        Grooming: Sitting;Min guard                   Toilet Transfer: Moderate assistance;RW;Minimal assistance;BSC;Cueing for safety;Cueing for sequencing   Toileting- Clothing Manipulation and Hygiene: Maximal assistance;Sit to/from stand       Functional mobility during ADLs: Minimal assistance;Moderate assistance;Cueing for safety        Vision  no change from baseline                              Cognition   Behavior During Therapy: [facility] for tasks assessed/performed Overall Cognitive Status: Within Functional Limits for tasks assessed Area of Impairment: Memory;Attention;Following commands;Safety/judgement;Problem solving   Current Attention Level: Sustained Memory: Decreased recall of precautions;Decreased short-term memory  Following Commands: Follows one step commands inconsistently;Follows one step commands with increased time Safety/Judgement: Decreased awareness of safety;Decreased awareness of deficits   Problem Solving: Slow processing;Decreased initiation;Requires verbal cues;Requires tactile cues;Difficulty sequencing      Extremity/Trunk Assessment   generalized weakness                        General Comments  pt very pleasant and cooperative, husband supportive    Pertinent Vitals/ Pain       Pain Assessment: No/denies pain Pain Location: c/o itching all over, discomfort  Home Living  at home with husband                                        Prior Functioning/Environment  assist with socks and shoes, uses RW           Frequency  Min 2X/week        Progress Toward Goals  OT Goals(current goals can now be found in the care plan section)  Progress towards OT goals: Progressing toward goals     Plan Discharge plan remains appropriate                     End of Session Equipment Utilized During Treatment: Rolling walker;Gait belt;Other (comment) (BSC)   Activity Tolerance Other  (comment) (limites by facial, UE and LE itching)   Patient Left with call bell/phone within reach;in bed;with family/visitor present (sitting EOB)             Time: 4818-5631 OT Time Calculation (min): 18 min  Charges: OT General Charges $OT Visit: 1 Procedure OT Treatments $Therapeutic Activity: 8-22 mins  Britt Bottom 06/03/2016, 2:26 PM

## 2016-06-03 NOTE — Progress Notes (Signed)
PROGRESS NOTE    Cassandra Bonilla  [id number] DOB: September 25, 1940 DOA: 05/24/2016 PCP: Marco Collie, MD   Brief Narrative:  75 year old with history of  Chronic Atrial fibrillation on Coumadin, Chronic Systolic CHF  with EF 0000000, Chronic anemia, CKD IV-V, HLD, DM Type 2, CAD native artery S/P CEA and CAD s/p 4v CABG 1993   Presented with melena and a hemoglobin of 6.2. recent hospitalization in [facility] ( 8/5 > 8/18) for hypokalemia, acute on chronic systolic and right-sided heart failure, and discharged on Lasix 120 mg twice a day. Patient also had acute kidney injury, that slowly improved. Patient was also treated for hepatic encephalopathy and discharged on lactulose and rifaximin. 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 She underwent liver biopsy on 8/16, awaiting pathology and was supposed to follow-up with [facility]. Patient also has atrial fibrillation and was discharged on Lovenox and Coumadin.    Patient was discharged to SNF on 8/18 were she stayed about 8-10 days prior to being discharged home with husband. Patient supposedly had a supratherapeutic INR few days back. Husband noted that the patient developed melena about a week ago. Patient continued to get weaker, decreased by mouth intake, nauseous and more lethargic. Brought into ER at [facility] and she was found to have INR of 3.7. She was given vitamin K and 2 units of fresh frozen plasma and 1 unit of blood and transferred to [facility] cone for further evaluation. Hemoglobin upon presentation was 6.2. Patient was also found to have ammonia level of 107, and was given 300 mg per rectum of lactulose enema. Lactic acid was 8.1, troponin 0.10.  Patient transferred to [facility] cone for further evaluation   Subjective: No significant complaints, reports the area overnight, there was afebrile, and his abdomen or chest pain  Assessment & Plan:   Principal Problem:   GIB (gastrointestinal bleeding) Active Problems:   Chronic  atrial fibrillation (HCC)   Type 2 diabetes mellitus with stage 4 chronic kidney disease, with long-term current use of insulin (HCC)   CKD (chronic kidney disease), stage IV (HCC)   Encephalopathy, hepatic (HCC)   GI bleed   Blood in stool   Cryptogenic cirrhosis of liver (HCC)   Pulmonary hypertension ([place])   Uncontrolled type 2 diabetes mellitus with complication (HCC)   Palliative care encounter   Goals of care, counseling/discussion   GIB - Acute hematochezia, melena  - anticoagulation reversed with vitamin & FF - 9/12 Colonoscopy show multiple polyps - Capsule study: Normal study - Per GI note believe secondary to AVMs not seen on capsule study, recommend holding Coumadin. - Schedule Follow up with Dr. Gatha [facility] GI in 2-3 weeks for GI bleed  Multiple colon polyps - All polyps removed sent for pathology.  Coagulopathy  -INR 3.7 at presentation. Reversed with vitamin & FFP, in the setting of liver disease and spontaneous GI bleeding and does not appear that ongoing use of anticoagulation is safe in this patient.   Recent Labs Lab 05/29/16 0415 05/30/16 0859  INR 1.43 1.30    Cryptogenic Liver cirrhosis w/ Hepatic encephalopathy -Ceftriaxone for SBP prophylaxis discontinued. Patient was not on medication prior to admission no indication to continue. - encephalopathy worsened by upper GI bleed contributing to severe uremia - Rifaximin 400 mg TID - Palliative care consult appreciated, remains full code - Patient with Elevated ammonia level,Ammonia level is 82 yesterday evening, significantly improved with increased dose lactulose, ammonia is 42 this a.m., will continue with current dose,  and reassess in a.m..  Acute encephalopathy - Due to hepatic encephalopathy plus uremia  - Negative MRI brain  - Ammonia level improving today  CKD Stage 4/5 -Nephrology following  Lab Results  Component Value Date   CREATININE 2.66 (H) 06/03/2016   CREATININE 2.59 (H)  06/02/2016   CREATININE 2.54 (H) 06/01/2016  -Nephrology has signed off. Schedule follow-up for 1-2 weeks with Dr Madelon Lips  [employer] CKD stage 4/5  Chronic Afib -Rate controlled  - not safe for anticoag at this time secondary to multiple GI bleed. - 9/14 currently in NSR - Upon discharge weight 1 week and then start  full dose aspirin.  Acute on chronic systolic CHF -EF [id number] via TTE 9/10, improved from 35% August 2017 - follow daily weights Filed Weights   06/01/16 0441 06/02/16 0446 06/03/16 0430  Weight: 71.9 kg (158 lb 8 oz) 70 kg (154 lb 6.4 oz) 71.6 kg (157 lb 12.8 oz)  - no ACEi/ARB for Renal failure - on  Toprol 25 mg daily (home dose 50 mg)  - Schedule follow-up appointment for 1-2 weeks with [facility] acute on chronic systolic CHF with chronic A. fib, unable to be anticoagulated secondary to multiple GI bleed - Patient initially on IV diuresis, troponin to transition to oral Lasix,currently on 120 mg of Lasix twice a day, despite that remain positive balance and her weight continues to increase, as well developed mild lower extremity edema, so will add 1 dose of metolazone today.  Severe Pulmonary Hypertension - See CHF  Hypokalemia/hypomagnesemia - Potassium goal > 4   Elevated troponin - TTE noted improvement in ejection fraction from 35% > 45% w/ no significant wall motion abnormality  DM Type 2 uncontrolled with complication - 9/8 hemoglobin A1c= 7.4 - Moderate SSI, and Lantus  Gout Quiescent    DVT prophylaxis: SCD Code Status: Full  Family Communication: Scheduled husband via phone 9/17 Disposition Plan: Discharge home with home care tomorrow if appropriately diuresed   Consultants:  Dr Renelda Loma Armbruster. [facility]    Procedures/Significant Events:  9/10 Echocardiogram:Left ventricle: mild LVH. -LVEF=  40% to 45%. -  Mitral valve:mild to moderate regurgitation. - Left  atrium: moderately dilated. - Right atrium: mildly dilated. - Tricuspid valve: severe regurgitation. - Pulmonary arteries: PA peak pressure: 75 mm Hg (S). 9/12 Colonoscopy: -Diverticulosis in the sigmoid colon.  - Three 4 to 5 mm polyps in the ascending colo, -- Two 5 to 6 mm polyps in the transverse colon,  -  One 10 mm polyp in the transverse colon, - One 4 mm polyp in the descending colon, -- No clear etiology for iron deficiency and GI Bleeding on this exam, which I suspect is small bowel in etiology.    Cultures 9/12 all polyps removed: TUBULAR ADENOMA(S).- HIGH GRADE DYSPLASIA IS NOT IDENTIFIED.    Antimicrobials: Rocephin 9/7 > 9/14   Objective: Vitals:   06/02/16 1304 06/02/16 2100 06/03/16 0430 06/03/16 1417  BP: (!) 127/59 (!) 110/50 122/68 (!) 101/50  Pulse: 88 83 95 97  Resp: 19 18 18 18   Temp: 98.4 F (36.9 C) 98 F (36.7 C) 98.5 F (36.9 C) 98.6 F (37 C)  TempSrc: Oral Axillary Oral Oral  SpO2: 100% 92% 100% 99%  Weight:   71.6 kg (157 lb 12.8 oz)   Height:        Intake/Output Summary (Last 24 hours) at 06/03/16 1519 Last data filed at 06/03/16 1227  Gross per  24 hour  Intake              720 ml  Output                0 ml  Net              720 ml   Filed Weights   06/01/16 0441 06/02/16 0446 06/03/16 0430  Weight: 71.9 kg (158 lb 8 oz) 70 kg (154 lb 6.4 oz) 71.6 kg (157 lb 12.8 oz)    Examination:  Awake alert 2, pleasent, communicative Supple neck, no JVD Good air entry bilaterally, no wheezing rales or rhonchi RRR, murmur or gallops Abdomen soft, nontender, nondistended, bowel sounds present, no ascites appreciated Images, no edema, no clubbing, no cyanosis      Data Reviewed: Care during the described time interval was provided by me .  I have reviewed this patient's available data, including medical history, events of note, physical examination, and all test results as part of my evaluation.  CBC:  Recent Labs Lab 05/28/16 0313  05/29/16 0415 05/30/16 0859 06/01/16 0350 06/02/16 0327 06/03/16 0257  WBC 6.8  --  6.2 7.0 6.8 6.6  HGB 8.6* 9.1* 9.6* 9.3* 9.2* 8.8*  HCT 28.2* 29.6* 31.4* 30.8* 30.7* 29.5*  MCV 84.4  --  87.2 85.8 86.2 86.5  PLT 204  --  215 225 213 [id number]   Basic Metabolic Panel:  Recent Labs Lab 05/28/16 0313 05/29/16 0415 05/30/16 0859 06/01/16 0350 06/01/16 0821 06/02/16 0327 06/03/16 0257  NA 133* 133* 136 135  --  135 135  K 3.1* 3.3* 3.3* 3.0*  --  3.7 3.9  CL 94* 94* 97* 97*  --  100* 101  CO2 27 27 27 26   --  22 23  GLUCOSE 79 116* 149* 157*  --  126* 132*  BUN 98* 90* 91* 77*  --  77* 74*  CREATININE 3.11* 2.90* 2.86* 2.54*  --  2.59* 2.66*  CALCIUM 8.0* 8.0* 8.2* 8.0*  --  8.0* 7.9*  MG 1.5* 1.9 1.7  --  1.4* 1.8  --   PHOS 4.2 3.8  --   --  3.3  --   --    GFR: Estimated Creatinine Clearance: 16.9 mL/min (by C-G formula based on SCr of 2.66 mg/dL (H)). Liver Function Tests:  Recent Labs Lab 05/28/16 0313 05/29/16 0415 05/30/16 0859  AST  --   --  34  ALT  --   --  20  ALKPHOS  --   --  160*  BILITOT  --   --  0.7  PROT  --   --  6.5  ALBUMIN 2.7* 2.6* 2.7*   No results for input(s): LIPASE, AMYLASE in the last 168 hours.  Recent Labs Lab 05/30/16 0859 06/01/16 0821 06/02/16 0729 06/02/16 1227 06/03/16 0948  AMMONIA 56* 61* 54* 82* 47*   Coagulation Profile:  Recent Labs Lab 05/29/16 0415 05/30/16 0859  INR 1.43 1.30   Cardiac Enzymes: No results for input(s): CKTOTAL, CKMB, CKMBINDEX, TROPONINI in the last 168 hours. BNP (last 3 results) No results for input(s): PROBNP in the last 8760 hours. HbA1C: No results for input(s): HGBA1C in the last 72 hours. CBG:  Recent Labs Lab 06/02/16 1126 06/02/16 1635 06/02/16 2059 06/03/16 0623 06/03/16 1115  GLUCAP 150* 194* 150* 120* 184*   Lipid Profile: No results for input(s): CHOL, HDL, LDLCALC, TRIG, CHOLHDL, LDLDIRECT in the last 72 hours. Thyroid Function Tests: No results for  input(s):  TSH, T4TOTAL, FREET4, T3FREE, THYROIDAB in the last 72 hours. Anemia Panel: No results for input(s): VITAMINB12, FOLATE, FERRITIN, TIBC, IRON, RETICCTPCT in the last 72 hours. Urine analysis:    Component Value Date/Time   COLORURINE YELLOW 05/25/2016 [place] 05/25/2016 1725   LABSPEC 1.014 05/25/2016 1725   PHURINE 6.0 05/25/2016 1725   GLUCOSEU NEGATIVE 05/25/2016 1725   HGBUR NEGATIVE 05/25/2016 1725   BILIRUBINUR NEGATIVE 05/25/2016 1725   KETONESUR NEGATIVE 05/25/2016 1725   PROTEINUR NEGATIVE 05/25/2016 1725   NITRITE NEGATIVE 05/25/2016 1725   LEUKOCYTESUR NEGATIVE 05/25/2016 1725   Sepsis Labs: @LABRCNTIP (procalcitonin:4,lacticidven:4)  ) No results found for this or any previous visit (from the past 240 hour(s)).       Radiology Studies: No results found.      Scheduled Meds: . camphor-menthol   Topical BID  . cholestyramine light  4 g Oral Q12H  . darbepoetin (ARANESP) injection - NON-DIALYSIS  60 mcg Subcutaneous Q Sun-1800  . furosemide  120 mg Oral BID  . insulin aspart  0-15 Units Subcutaneous TID WC  . insulin glargine  5 Units Subcutaneous Daily  . lactulose  30 g Oral TID  . metolazone  2.5 mg Oral Once  . metoprolol succinate  25 mg Oral Daily  . pantoprazole  40 mg Oral Q0600  . potassium chloride SA  40 mEq Oral Daily  . rifaximin  400 mg Oral TID   Continuous Infusions:    LOS: 10 days      Phillips Climes, MD [facility] Pager [phone]  If 7PM-7AM, please contact night-coverage [email or web address] Password [facility] 06/03/2016, 3:19 PM

## 2016-06-03 NOTE — Care Management Important Message (Signed)
Important Message  Patient Details  Name: Cassandra Bonilla MRN: [id number] Date of Birth: 04/22/1941   Medicare Important Message Given:  Yes    [name] May 06/03/2016, 9:46 AM

## 2016-06-04 LAB — BASIC METABOLIC PANEL
Anion gap: 8 (ref 5–15)
BUN: 72 mg/dL — AB (ref 6–20)
CALCIUM: 8 mg/dL — AB (ref 8.9–10.3)
CO2: 25 mmol/L (ref 22–32)
CREATININE: 2.8 mg/dL — AB (ref 0.44–1.00)
Chloride: 103 mmol/L (ref 101–111)
GFR, EST AFRICAN AMERICAN: 18 mL/min — AB (ref >60)
GFR, EST NON AFRICAN AMERICAN: 15 mL/min — AB (ref >60)
Glucose, Bld: 89 mg/dL (ref 65–99)
Potassium: 4 mmol/L (ref 3.5–5.1)
SODIUM: 136 mmol/L (ref 135–145)

## 2016-06-04 LAB — CBC
HCT: 28.7 % — ABNORMAL LOW (ref 36.0–46.0)
Hemoglobin: 8.6 g/dL — ABNORMAL LOW (ref 12.0–15.0)
MCH: 26 pg (ref 26.0–34.0)
MCHC: 30 g/dL (ref 30.0–36.0)
MCV: 86.7 fL (ref 78.0–100.0)
PLATELETS: 225 10*3/uL (ref 150–400)
RBC: 3.31 MIL/uL — ABNORMAL LOW (ref 3.87–5.11)
RDW: 18.2 % — ABNORMAL HIGH (ref 11.5–15.5)
WBC: 6.8 10*3/uL (ref 4.0–10.5)

## 2016-06-04 LAB — GLUCOSE, CAPILLARY
GLUCOSE-CAPILLARY: 76 mg/dL (ref 65–99)
Glucose-Capillary: 118 mg/dL — ABNORMAL HIGH (ref 65–99)

## 2016-06-04 LAB — AMMONIA: AMMONIA: 74 umol/L — AB (ref 9–35)

## 2016-06-04 MED ORDER — CHOLESTYRAMINE LIGHT 4 G PO PACK: 4.0000 g | PACK | Freq: Two times a day (BID) | ORAL | 0 refills | Status: AC

## 2016-06-04 MED ORDER — INSULIN GLARGINE 100 UNIT/ML ~~LOC~~ SOLN: 5.0000 [IU] | SUBCUTANEOUS | 11 refills | Status: AC

## 2016-06-04 MED ORDER — ACETAMINOPHEN 500 MG PO TABS
500.0000 mg | ORAL_TABLET | Freq: Four times a day (QID) | ORAL | 0 refills | Status: AC | PRN
Start: 2016-06-04 — End: ?

## 2016-06-04 MED ORDER — LACTULOSE 10 GM/15ML PO SOLN: 30.0000 g | Freq: Three times a day (TID) | ORAL | 1 refills | Status: AC

## 2016-06-04 MED ORDER — METOPROLOL SUCCINATE ER 25 MG PO TB24: 25.0000 mg | ORAL_TABLET | Freq: Every day | ORAL | 0 refills | Status: DC

## 2016-06-04 NOTE — Care Management Note (Signed)
Case Management Note Previous CM note initiated by Zenon Mayo, RN 05/29/2016, 11:53 AM    Patient Details  Name: Cassandra Bonilla MRN: [id number] Date of Birth: [birth year]  Subjective/Objective:    Presents with GIB, AMS, lethargic, Patient was previously in [place] snf from last admission for 8-10 days then went home with spouse, she has a rolling walker, a cane, and w/chair and a bsc. She states she uses the w/chair mostly. Most likely will need pt eval.   9/11- per spouse, patient is active with [facility] for [facility],- checking pt/inr  And meds and HHPT, will like to add a HHaide.  She has a pcp, she has insurance to cover medications, and she will be transported by car at dc.  Spouse will like to continue with [facility]. Her EGD (-) 9/10, protonix iv change to po today per RN. On lactulose for liver failure. Spouse is very supportive of patient at home (he states he does a lot for her in regards to assistance).       9/13- NCM spoke with Butch Penny at [facility], confirms patient is active with them for [facility] and [place], she states the RN will have to come out to do assessment to see if they will be able to do an aide for them.  Also NCM will need to fax discharge summary to  [facility] at [phone], and they will need to be notified when patient is being discharged.                  Action/Plan: 9/15-update-Ivianna Notch RN, CM-Pt tx from [facility] [place] consulted for possible SNF placement however pt only has 5 SNF days left before she has out of pocket copay cost- per CSW spouse can not afford out of pocket cost and wants to take pt back home with [facility] services- with [facility]-   Expected Discharge Date:    06/04/16              Expected Discharge Plan:  [place]  In-House Referral:  Clinical Social Work  Discharge planning Services  CM Consult  Post Acute Care Choice:  Resumption of Svcs/PTA Provider, Home  Health Choice offered to:  Spouse  DME Arranged:    DME Agency:     HH Arranged:  RN, PT, Nurse's Aide, OT [place] Agency:  [place]  Status of Service:  Completed, signed off  If discussed at [place] of Stay Meetings, dates discussed:  06/04/16  Additional Comments  06/04/16- Valentina Gu, CM- pt for d/c home today with resumption of [facility] services- have spoken with Tillie Rung at [facility]- they have needed orders to resume HHRN/PT/OT/aide- d/c summary faxed to [phone] Epic.   05/31/16- 1545- Marvetta Gibbons RN, CM- HH orders have been faxed to [facility] per previous CM- resent order today via epic to [phone]- pt will need to have d/c summary faxed when available to same # and also a call made to [facility] to notify them of pt's discharge- [phone])   Dawayne Patricia, [place] 06/04/2016, 2:12 PM [phone]

## 2016-06-04 NOTE — Discharge Summary (Signed)
Cassandra Bonilla, is a 75 y.o. female  DOB 1941/08/03  MRN [id number].  Admission date:  05/24/2016  Admitting Physician  Lily Kocher, MD  Discharge Date:  06/04/2016   Primary MD  Marco Collie, MD  Recommendations for primary care physician for things to follow:  - Please check CBC, CMP, ammonia level in one week, may resume back on aspirin if hemoglobin remained stable, and adjust lactulose dose as needed, ammonia 74 discharge, but patient is awake alert oriented appropriate, cognition and mentation at her best since she's been in the hospital. - Patient will need to follow with GI as an outpatient regarding when appropriate to resume full anticoagulation   Admission Diagnosis  GASTROINTESTINAL BLEED Melena gi bleed, anemia. anemia   Discharge Diagnosis  GASTROINTESTINAL BLEED Melena gi bleed, anemia. 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 CHF (congestive heart failure) ([place])   . CKD (chronic kidney disease) stage 3, GFR 30-59 ml/min   . COPD (chronic obstructive pulmonary disease) ([place])   . Coronary artery disease   . Diabetes mellitus without complication ([place])   . Gout   . Hypertension   . Hypokalemia 03/21/2016    Past Surgical History:  Procedure Laterality Date  . CAROTID ENDARTERECTOMY    . CESAREAN SECTION    . CHOLECYSTECTOMY    . COLONOSCOPY  N/A 05/28/2016   Procedure: COLONOSCOPY;  Surgeon: Manus Gunning, MD;  Location[facility] ENDOSCOPY;  Service: Gastroenterology;  Laterality: N/A;  . CORONARY ANGIOPLASTY    . CORONARY ARTERY BYPASS GRAFT    . ESOPHAGOGASTRODUODENOSCOPY N/A 05/26/2016   Procedure: ESOPHAGOGASTRODUODENOSCOPY (EGD);  Surgeon: Juanita Craver, MD;  Location: [facility] ENDOSCOPY;  Service: Endoscopy;  Laterality: N/A;  . GIVENS CAPSULE STUDY N/A 05/28/2016   Procedure: GIVENS CAPSULE STUDY;  Surgeon: Manus Gunning, MD;  Location: [place];  Service: Gastroenterology;  Laterality: N/A;       History of present illness and  Hospital Course:     Kindly see H&P for history of present illness and admission details, please review complete Labs, Consult reports and Test reports for all details in brief  HPI  from the history and physical done on the day of admission 05/24/2016  75 year old African-American female with past medical history of chronic atrial fibrillation on Coumadin, chronic systolic heart failure with EF 45%, chronic anemia, CKD IV-V, HTN, HLD, DM, carotid artery stenosis s/p CEA and CAD s/p 4v CABG 1993 presented with melena and  a hemoglobin of 6.2. Patient was recently hospitalized in [facility] between 8/5 and 8/18 for hypokalemia, acute on chronic systolic and right-sided heart failure, and discharged on Lasix 120 mg twice a day. Patient also had acute kidney injury, that slowly improved. Patient was also treated for hepatic encephalopathy and discharged on lactulose and rifaximin. 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 She underwent liver biopsy on 8/16, awaiting pathology and was supposed to follow-up with [facility]. Patient also has atrial fibrillation and was discharged on Lovenox and Coumadin.    Patient was discharged to SNF on 8/18 were she stayed about 8-10 days prior to being discharged home with husband. Patient supposedly had a supratherapeutic INR few days back. Husband  noted that the patient developed melena about a week ago. Patient continued to get weaker, decreased by mouth intake, nauseous and more lethargic. Brought into ER at [facility] and she was found to have INR of 3.7. She was given vitamin K and 2 units of fresh frozen plasma and 1 unit of blood and transferred to [facility] for further evaluation. Hemoglobin upon presentation was 6.2. Patient was also found to have ammonia level of 107, and was given 300 mg per rectum of lactulose enema. Lactic acid was 8.1, troponin 0.10.  Patient transferred to [facility] for further evaluation   Hospital Course    GIB - Acute hematochezia, melena  - anticoagulation reversed with vitamin &FF - 9/12 Colonoscopy show multiple polyps - Capsule study: Normal study - Per GI note believe secondary to AVMs not seen on capsule study, recommend holding Coumadin. - Schedule Follow up with Dr. Gatha Mayer GI in 2-3 weeks for GI bleed  Multiple colon polyps - All polyps removed sent for pathology.  Coagulopathy  -INR 3.7 at presentation. Reversed with vitamin &FFP, in the setting of liver disease and spontaneous GI bleeding and does not appear that ongoing use of anticoagulation is safe in this patient.   Last Labs    Recent Labs Lab 05/29/16 0415 05/30/16 0859  INR 1.43 1.30      Cryptogenic Liver cirrhosis w/ Hepatic encephalopathy -Ceftriaxone for SBP prophylaxis discontinued. Patient was not on medication prior to admission no indication to continue. - encephalopathy worsened by upper GI bleed contributing to severe uremia - Rifaximin 400 mg TID - Palliative care consult appreciated, remains full code - Patient With elevated ammonia level, fluctuate widely, ammonia 74 this a.m., but patient mentation is appropriate, and actually the best since she's ever been to the hospital, so we'll discharge her on current lactulose dose 30 ml 3 times daily.  Acute encephalopathy - Due to hepatic  encephalopathy plus uremia  - Negative MRI brain    CKD Stage 4/5 -Nephrology following  -Nephrology has signed off. Schedule follow-up for 1-2 weeks with Dr Madelon Lips [facility] CKD stage 4/5   Chronic Afib -Rate controlled  - not safe for anticoag at this time secondary to multiple GI bleed. - 9/14 currently in NSR - May resume aspirin in 1 week of hemoglobin remained stable  Acute on chronic systolic CHF -EF [id number] via TTE 9/10, improved from 35% August 2017 - follow daily weights - no ACEi/ARB for Renal failure - on  Toprol 25 mg daily (previous home dose 50 mg)  - Schedule follow-up appointment for 1-2 weeks with [facility] acute on chronic systolic CHF with chronic A. fib, unable to be anticoagulated secondary to multiple GI bleed - Patient appropriate  30s during hospital stay, recommendation is for home dose 120 mg oral twice daily  Severe Pulmonary Hypertension - See CHF  Hypokalemia/hypomagnesemia - Potassium goal > 4   Elevated troponin - TTE notedimprovement in ejection fraction from 35% >45% w/ nosignificant wall motion abnormality  DM Type 2 uncontrolled with complication - 9/8 hemoglobin A1c= 7.4 -  Lantus 5 units daily, insulin sliding scale  Gout Quiescent     Discharge Condition:  Stable   Follow UP  Follow-up Information    Silvano Rusk, MD. Schedule an appointment as soon as possible for a visit in 3 week(s).   Specialty:  Gastroenterology Why:  Schedule Follow up with Dr. Gatha Mayer GI in 2-3 weeks for GI bleed Contact information: [address] [place] [address] [phone]        Larae Grooms, MD. Schedule an appointment as soon as possible for a visit in 2 week(s).   Specialties:  Cardiology, Radiology, Interventional Cardiology Why:  Schedule follow-up appointment for 1-2 weeks with [facility] acute on chronic systolic CHF with  chronic A. fib, unable to be anticoagulated secondary to multiple GI bleed Contact information: [address] [place] [address] [phone]        Madelon Lips, MD. Schedule an appointment as soon as possible for a visit in 2 week(s).   Specialty:  Nephrology Why:   Schedule follow-up for 1-2 weeks with Dr Madelon Lips [facility] CKD stage 4/5 Contact information: [place] [address] [phone]        HODGES,BETH, MD Follow up in 1 week(s).   Specialty:  Family Medicine Why:  For repeat labs Contact information: [place]. [place] [address] [phone]             Discharge Instructions  and  Discharge Medications     Discharge Instructions    Discharge instructions    Complete by:  As directed    Follow with Primary MD HODGES,BETH, MD in 7 days   Get CBC, CMP, ammonia ,checked  by Primary MD next visit.    Activity: As tolerated with Full fall precautions use walker/cane & assistance as needed   Disposition Home    Diet: Heart Healthy, carb modified with 1200 mL fluid restriction , with feeding assistance and aspiration precautions.  For Heart failure patients - Check your Weight same time everyday, if you gain over 2 pounds, or you develop in leg swelling, experience more shortness of breath or chest pain, call your Primary MD immediately. Follow Cardiac Low Salt Diet and 1.5 lit/day fluid restriction.   On your next visit with your primary care physician please Get Medicines reviewed and adjusted.   Please request your Prim.MD to go over all Hospital Tests and Procedure/Radiological results at the follow up, please get all Hospital records sent to your Prim MD by signing hospital release before you go home.   If you experience worsening of your admission symptoms, develop shortness of breath, life threatening emergency, suicidal or homicidal thoughts you must seek medical  attention immediately by calling 911 or calling your MD immediately  if symptoms less severe.  You Must read complete instructions/literature along with all the possible adverse reactions/side effects for all the Medicines you take and that have been prescribed to you. Take any new Medicines after you have completely understood and accpet all the possible adverse reactions/side effects.   Do not drive, operating heavy machinery, perform activities  at heights, swimming or participation in water activities or provide baby sitting services if your were admitted for syncope or siezures until you have seen by Primary MD or a Neurologist and advised to do so again.  Do not drive when taking Pain medications.    Do not take more than prescribed Pain, Sleep and Anxiety Medications  Special Instructions: If you have smoked or chewed Tobacco  in the last 2 yrs please stop smoking, stop any regular Alcohol  and or any Recreational drug use.  Wear Seat belts while driving.   Please note  You were cared for by a hospitalist during your hospital stay. If you have any questions about your discharge medications or the care you received while you were in the hospital after you are discharged, you can call the unit and asked to speak with the hospitalist on call if the hospitalist that took care of you is not available. Once you are discharged, your primary care physician will handle any further medical issues. Please note that NO REFILLS for any discharge medications will be authorized once you are discharged, as it is imperative that you return to your primary care physician (or establish a relationship with a primary care physician if you do not have one) for your aftercare needs so that they can reassess your need for medications and monitor your lab values.   Increase activity slowly    Complete by:  As directed        Medication List    STOP taking these medications   acetaminophen 650 MG CR  tablet Commonly known as:  TYLENOL Replaced by:  acetaminophen 500 MG tablet   allopurinol 100 MG tablet Commonly known as:  ZYLOPRIM   amLODipine 10 MG tablet Commonly known as:  NORVASC   aspirin EC 81 MG tablet   guaiFENesin-codeine 100-10 MG/5ML syrup Commonly known as:  ROBITUSSIN AC   metolazone 5 MG tablet Commonly known as:  ZAROXOLYN   warfarin 2 MG tablet Commonly known as:  COUMADIN     TAKE these medications   acetaminophen 500 MG tablet Commonly known as:  TYLENOL Take 1 tablet (500 mg total) by mouth every 6 (six) hours as needed for mild pain (or Fever >/= 101). Replaces:  acetaminophen 650 MG CR tablet   atorvastatin 40 MG tablet Commonly known as:  LIPITOR Take 40 mg by mouth daily.   cholestyramine light 4 g packet Commonly known as:  PREVALITE Take 1 packet (4 g total) by mouth every 12 (twelve) hours.   furosemide 40 MG tablet Commonly known as:  LASIX Take 3 tablets (120 mg total) by mouth 2 (two) times daily.   hydrOXYzine 25 MG tablet Commonly known as:  ATARAX/VISTARIL Take 1-2 tablets (25-50 mg total) by mouth every 6 (six) hours as needed for itching. What changed:  when to take this  additional instructions   insulin glargine 100 UNIT/ML injection Commonly known as:  LANTUS Inject 0.05 mLs (5 Units total) into the skin every morning. What changed:  how much to take   insulin lispro 100 UNIT/ML injection Commonly known as:  HUMALOG Inject 2-15 Units into the skin 3 (three) times daily after meals. 2-8 units if CBG <400, takes 15 units if CBG >400 (per sliding scale)   lactulose 10 GM/15ML solution Commonly known as:  CHRONULAC Take 45 mLs (30 g total) by mouth 3 (three) times daily. What changed:  how much to take  when to take this   metoprolol  succinate 25 MG 24 hr tablet Commonly known as:  TOPROL-XL Take 1 tablet (25 mg total) by mouth daily. Start taking on:  06/05/2016 What changed:  medication strength  how  much to take  additional instructions   pantoprazole 40 MG tablet Commonly known as:  PROTONIX Take 40 mg by mouth daily.   potassium chloride SA 20 MEQ tablet Commonly known as:  K-DUR,KLOR-CON Take 2 tablets (40 mEq total) by mouth daily.   rifaximin 200 MG tablet Commonly known as:  XIFAXAN Take 2 tablets (400 mg total) by mouth 3 (three) times daily. What changed:  when to take this         Diet and Activity recommendation: See Discharge Instructions above   Consults obtained -  9/10 Echocardiogram:Left ventricle: mild LVH. -LVEF=  40% to 45%. -  Mitral valve:mild to moderate regurgitation. - Left atrium: moderately dilated. - Right atrium: mildly dilated. - Tricuspid valve: severe regurgitation. - Pulmonary arteries: PApeak pressure: 75 mm Hg (S). 9/12 Colonoscopy: -Diverticulosis in the sigmoid colon. - Three 4 to 5 mm polyps in the ascending colo, -- Two 5 to 6 mm polyps in the transverse colon,  -One 10 mm polyp in the transverse colon, - One 4 mm polyp in the descending colon, -- No clear etiology for iron deficiency and GI Bleeding on this exam, which I suspect is small bowel in etiology.   Major procedures and Radiology Reports - PLEASE review detailed and final reports for all details, in brief -      Mr Brain Wo Contrast  Result Date: 05/25/2016 CLINICAL DATA:  Acute encephalopathy. EXAM: MRI HEAD WITHOUT CONTRAST TECHNIQUE: Multiplanar, multiecho pulse sequences of the brain and surrounding structures were obtained without intravenous contrast. COMPARISON:  Head CT from 2 days ago FINDINGS: Brain: No acute infarction, hemorrhage, hydrocephalus, extra-axial collection or mass lesion. Generalized cortical atrophy. 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 Mild confluent periventricular small vessel ischemic gliosis. Vascular: No evidence of major vessel occlusion Skull and upper cervical spine: Hypo  intense appearance of the calvarium which is sclerotic on previous head CT, potentially related to patient's history of chronic kidney disease. No focal masslike marrow lesion. Sinuses/Orbits: Right mastoid effusion with fluid levels, also seen 04/29/2016 by head CT. No obstructive process seen at the nasopharynx. Left cataract resection. Other: None. IMPRESSION: 1. No acute finding. 2. Chronic microvascular disease and mild cortical atrophy. 3. Chronic right mastoid effusion. Electronically Signed   By: Monte Fantasia M.D.   On: 05/25/2016 18:49   Dg Chest Port 1 View  Result Date: 05/25/2016 CLINICAL DATA:  Central line placement EXAM: PORTABLE CHEST 1 VIEW COMPARISON:  05/24/2016 FINDINGS: Cardiomegaly again noted. Status post median sternotomy. Again noted central mild vascular congestion and mild perihilar interstitial prominence suspicious for mild pulmonary edema. Probable small left pleural effusion with left basilar atelectasis or infiltrate. Right IJ central line with tip in SVC. No pneumothorax. IMPRESSION: Right IJ line with tip in SVC. No pneumothorax. Cardiomegaly. Status post CABG. Central vascular congestion and mild perihilar interstitial prominence suspicious for mild interstitial edema. Electronically Signed   By: Lahoma Crocker M.D.   On: 05/25/2016 15:59   Portable Chest 1 View  Result Date: 05/24/2016 CLINICAL DATA:  Altered mental status, bloody stool. Former smoker. History of CHF, hypertension, coronary artery disease and diabetes. EXAM: PORTABLE CHEST 1 VIEW COMPARISON:  05/23/2016, 04/28/2016 FINDINGS: Semi-erect AP portable view chest. Patient is slightly rotated. There  are low lung volumes. There is evidence of previous sternotomy with sternal wires and surgical clips. Surgical clips at the left upper mediastinum are also noted. There is continued enlargement of the cardiomediastinal silhouette with dense atherosclerosis of the aorta. There are small bilateral effusions. Slight  interval increase in patchy perihilar opacities perforating no pneumothorax. A linear opacity at the left base which could represent pleural calcification is again noted. IMPRESSION: 1. Stable enlargement of the cardiomediastinal silhouette with mild central vascular congestion present. 2. Low lung volumes with development of tiny bilateral effusions. Slight interval increase in perihilar patchy airspace opacities. Electronically Signed   By: Donavan Foil M.D.   On: 05/24/2016 09:39   Dg Abd Portable 1v  Result Date: 05/27/2016 CLINICAL DATA:  Nasogastric tube placement. EXAM: PORTABLE ABDOMEN - 1 VIEW COMPARISON:  Lumbar spine dated 04/27/2016. FINDINGS: Nasogastric tube extending into the distal stomach and folded back against itself at the side hole with the tip in the mid stomach. Cholecystectomy clips. Normal bowel gas pattern. Arterial calcifications. Lower thoracic spine degenerative changes. IMPRESSION: Nasogastric tube folded back against itself in the distal stomach with its tip in the mid stomach. Electronically Signed   By: Claudie Revering M.D.   On: 05/27/2016 17:17    Micro Results   No results found for this or any previous visit (from the past 240 hour(s)).     Today   Subjective:   Kamyria Hokett today has no headache,no chest or abdominal pain,no new weakness tingling or numbness, feels much better wants to go home today.   Objective:   Blood pressure (!) 104/50, pulse 96, temperature 97.6 F (36.4 C), temperature source Oral, resp. rate 14, height 5\' 2"  (1.575 m), weight 70.9 kg (156 lb 6.4 oz), SpO2 100 %.   Intake/Output Summary (Last 24 hours) at 06/04/16 1141 Last data filed at 06/04/16 0917  Gross per 24 hour  Intake              900 ml  Output                1 ml  Net              899 ml    Exam Awake Alert, Oriented x 2, Cognition and communication much improved today. [place].AT,PERRAL Supple Neck,No JVD,  Symmetrical Chest wall movement, Good air movement  bilaterally, CTAB RRR,No Gallops,Rubs or new Murmurs, No Parasternal Heave +ve B.Sounds, Abd Soft, Non tender,  no ascites No Cyanosis, Clubbing or edema, No new Rash or bruise  Data Review   CBC w Diff: Lab Results  Component Value Date   WBC 6.8 06/04/2016   HGB 8.6 (L) 06/04/2016   HCT 28.7 (L) 06/04/2016   PLT 225 06/04/2016   LYMPHOPCT 12 05/26/2016   MONOPCT 10 05/26/2016   EOSPCT 1 05/26/2016   BASOPCT 0 05/26/2016    CMP: Lab Results  Component Value Date   NA 136 06/04/2016   K 4.0 06/04/2016   CL 103 06/04/2016   CO2 25 06/04/2016   BUN 72 (H) 06/04/2016   CREATININE 2.80 (H) 06/04/2016   PROT 6.5 05/30/2016   ALBUMIN 2.7 (L) 05/30/2016   BILITOT 0.7 05/30/2016   ALKPHOS 160 (H) 05/30/2016   AST 34 05/30/2016   ALT 20 05/30/2016  .   Total Time in preparing paper work, data evaluation and todays exam - 35 minutes  Gustavo Meditz M.D on 06/04/2016 at 11:41 AM  [facility]   Office  [phone]     

## 2016-06-04 NOTE — Discharge Instructions (Signed)
Follow with Primary MD HODGES,BETH, MD in 7 days   Get CBC, CMP, ammonia ,checked  by Primary MD next visit.    Activity: As tolerated with Full fall precautions use walker/cane & assistance as needed   Disposition Home    Diet: Heart Healthy, carb modified with 1200 mL fluid restriction , with feeding assistance and aspiration precautions.  For Heart failure patients - Check your Weight same time everyday, if you gain over 2 pounds, or you develop in leg swelling, experience more shortness of breath or chest pain, call your Primary MD immediately. Follow Cardiac Low Salt Diet and 1.5 lit/day fluid restriction.   On your next visit with your primary care physician please Get Medicines reviewed and adjusted.   Please request your Prim.MD to go over all Hospital Tests and Procedure/Radiological results at the follow up, please get all Hospital records sent to your Prim MD by signing hospital release before you go home.   If you experience worsening of your admission symptoms, develop shortness of breath, life threatening emergency, suicidal or homicidal thoughts you must seek medical attention immediately by calling 911 or calling your MD immediately  if symptoms less severe.  You Must read complete instructions/literature along with all the possible adverse reactions/side effects for all the Medicines you take and that have been prescribed to you. Take any new Medicines after you have completely understood and accpet all the possible adverse reactions/side effects.   Do not drive, operating heavy machinery, perform activities at heights, swimming or participation in water activities or provide baby sitting services if your were admitted for syncope or siezures until you have seen by Primary MD or a Neurologist and advised to do so again.  Do not drive when taking Pain medications.    Do not take more than prescribed Pain, Sleep and Anxiety Medications  Special Instructions: If you  have smoked or chewed Tobacco  in the last 2 yrs please stop smoking, stop any regular Alcohol  and or any Recreational drug use.  Wear Seat belts while driving.   Please note  You were cared for by a hospitalist during your hospital stay. If you have any questions about your discharge medications or the care you received while you were in the hospital after you are discharged, you can call the unit and asked to speak with the hospitalist on call if the hospitalist that took care of you is not available. Once you are discharged, your primary care physician will handle any further medical issues. Please note that NO REFILLS for any discharge medications will be authorized once you are discharged, as it is imperative that you return to your primary care physician (or establish a relationship with a primary care physician if you do not have one) for your aftercare needs so that they can reassess your need for medications and monitor your lab values.

## 2016-06-04 NOTE — Progress Notes (Signed)
Discharge Note:  Patient alert and oriented to self and place only.  Disoriented to time and situation.  She is in no distress.  Patient's spouse given discharge instructions regarding signs and symptoms to report, medications, diet, activity, and upcoming appointments. He verbalized understanding of all instructions.  Telemetry and peripheral IV discontinued. Patient's husband confirmed that he had all of her personal belongings. Patient transported out via wheelchair by this RN.

## 2016-06-05 ENCOUNTER — Encounter: Payer: Medicare Other | Admitting: Gastroenterology

## 2016-06-06 DIAGNOSIS — I5022 Chronic systolic (congestive) heart failure: Secondary | ICD-10-CM | POA: Diagnosis not present

## 2016-06-06 DIAGNOSIS — Z7984 Long term (current) use of oral hypoglycemic drugs: Secondary | ICD-10-CM | POA: Diagnosis not present

## 2016-06-06 DIAGNOSIS — E1122 Type 2 diabetes mellitus with diabetic chronic kidney disease: Secondary | ICD-10-CM | POA: Diagnosis not present

## 2016-06-06 DIAGNOSIS — I130 Hypertensive heart and chronic kidney disease with heart failure and stage 1 through stage 4 chronic kidney disease, or unspecified chronic kidney disease: Secondary | ICD-10-CM | POA: Diagnosis not present

## 2016-06-06 DIAGNOSIS — M6281 Muscle weakness (generalized): Secondary | ICD-10-CM | POA: Diagnosis not present

## 2016-06-06 DIAGNOSIS — N184 Chronic kidney disease, stage 4 (severe): Secondary | ICD-10-CM | POA: Diagnosis not present

## 2016-06-06 DIAGNOSIS — Z9181 History of falling: Secondary | ICD-10-CM | POA: Diagnosis not present

## 2016-06-06 DIAGNOSIS — Z794 Long term (current) use of insulin: Secondary | ICD-10-CM | POA: Diagnosis not present

## 2016-06-06 DIAGNOSIS — R262 Difficulty in walking, not elsewhere classified: Secondary | ICD-10-CM | POA: Diagnosis not present

## 2016-06-07 ENCOUNTER — Encounter: Payer: Self-pay | Admitting: Cardiovascular Disease

## 2016-06-07 DIAGNOSIS — Z794 Long term (current) use of insulin: Secondary | ICD-10-CM | POA: Diagnosis not present

## 2016-06-07 DIAGNOSIS — I5022 Chronic systolic (congestive) heart failure: Secondary | ICD-10-CM | POA: Diagnosis not present

## 2016-06-07 DIAGNOSIS — E1122 Type 2 diabetes mellitus with diabetic chronic kidney disease: Secondary | ICD-10-CM | POA: Diagnosis not present

## 2016-06-07 DIAGNOSIS — N184 Chronic kidney disease, stage 4 (severe): Secondary | ICD-10-CM | POA: Diagnosis not present

## 2016-06-07 DIAGNOSIS — Z7984 Long term (current) use of oral hypoglycemic drugs: Secondary | ICD-10-CM | POA: Diagnosis not present

## 2016-06-07 DIAGNOSIS — Z9181 History of falling: Secondary | ICD-10-CM | POA: Diagnosis not present

## 2016-06-07 DIAGNOSIS — M6281 Muscle weakness (generalized): Secondary | ICD-10-CM | POA: Diagnosis not present

## 2016-06-07 DIAGNOSIS — I130 Hypertensive heart and chronic kidney disease with heart failure and stage 1 through stage 4 chronic kidney disease, or unspecified chronic kidney disease: Secondary | ICD-10-CM | POA: Diagnosis not present

## 2016-06-07 DIAGNOSIS — R262 Difficulty in walking, not elsewhere classified: Secondary | ICD-10-CM | POA: Diagnosis not present

## 2016-06-10 ENCOUNTER — Ambulatory Visit (INDEPENDENT_AMBULATORY_CARE_PROVIDER_SITE_OTHER): Payer: Medicare Other | Admitting: Cardiovascular Disease

## 2016-06-10 ENCOUNTER — Encounter: Payer: Self-pay | Admitting: *Deleted

## 2016-06-10 VITALS — BP 127/81 | HR 87 | Ht 62.0 in | Wt 153.0 lb

## 2016-06-10 DIAGNOSIS — R0602 Shortness of breath: Secondary | ICD-10-CM | POA: Diagnosis not present

## 2016-06-10 DIAGNOSIS — I272 Other secondary pulmonary hypertension: Secondary | ICD-10-CM

## 2016-06-10 DIAGNOSIS — I482 Chronic atrial fibrillation, unspecified: Secondary | ICD-10-CM

## 2016-06-10 DIAGNOSIS — I5042 Chronic combined systolic (congestive) and diastolic (congestive) heart failure: Secondary | ICD-10-CM

## 2016-06-10 DIAGNOSIS — I10 Essential (primary) hypertension: Secondary | ICD-10-CM | POA: Diagnosis not present

## 2016-06-10 LAB — BASIC METABOLIC PANEL
BUN: 80 mg/dL — ABNORMAL HIGH (ref 7–25)
CALCIUM: 8.5 mg/dL — AB (ref 8.6–10.4)
CO2: 19 mmol/L — AB (ref 20–31)
Chloride: 103 mmol/L (ref 98–110)
Creat: 3.31 mg/dL — ABNORMAL HIGH (ref 0.60–0.93)
GLUCOSE: 64 mg/dL — AB (ref 65–99)
Potassium: 5.8 mmol/L — ABNORMAL HIGH (ref 3.5–5.3)
Sodium: 134 mmol/L — ABNORMAL LOW (ref 135–146)

## 2016-06-10 MED ORDER — TORSEMIDE 20 MG PO TABS: ORAL_TABLET | ORAL | 3 refills | Status: DC

## 2016-06-10 NOTE — Progress Notes (Signed)
Cardiology Office Note   Date:  06/10/2016   ID:  Cassandra Bonilla, DOB 1941/01/07, MRN [id number]  PCP:  Marco Collie, MD  Cardiologist:   Skeet Latch, MD  Nephrologist: Dr. Hollie Salk  Chief Complaint  Patient presents with  . New Patient (Initial Visit)    dizziness; when standing up.       History of Present Illness: Cassandra Bonilla is a 75 y.o. female with chronic systolic and diastolic heart failure (LVEF 40-45%), chronic atrial fibrillation COPD, carotid stenosis s/p CEA, CAD s/p CABG, diabetes mellitus, CKD 4, GI bleed, cryptogenic cirrhosis, and severe pulmonary hypertension who presents for an evaluation of lower extremity edema.  Cassandra Bonilla was admitted to [facility] 9/8-9/19 with GI bleed.  INR was 3.7 on admission. This was reversed with vitamin K and FFP. She underwent colonoscopy and was found to have diverticulosis. Several polyps were removed, but there was no clear etiology of the bleeding.  She also had a capsule endoscopy that did not reveal a bleeding source. Warfarin was held throughout hospitalization and was not restarted at discharge.  She is scheduled to follow-up with GI later this week, at which time we will determine if she is okay to restart anticoagulation. She denies any recurrent melena or hematochezia since discharge.  Prior to that admission she was hospitalized 8/5-8/18 with acute on chronic systolic and right sided heart failure.  She was also treated for hepatic encephalopathy.  Since being discharged from the hospital on 06/04/16, Cassandra Bonilla has been feeling fairly well. However, she notes that for the last 4-5 days she has been audibly wheezing. She reports a cough and clear phlegm. She has not noted any fever or chills. Her weight has been stable though she has noted some increase in lower extremity edema. She also endorses orthopnea and PND. She feels as though she's having a panic attack when she tries to lay down. She endorses shortness of breath with  ambulation. Her husband notes that her appetite has been poor.    Past Medical History:  Diagnosis Date  . CHF (congestive heart failure) ([place])   . CKD (chronic kidney disease) stage 3, GFR 30-59 ml/min   . Colon polyps 05/28/2016   3 ascending, 3 transverse, 1 descending  . COPD (chronic obstructive pulmonary disease) ([place])   . Coronary artery disease   . Diabetes mellitus without complication ([place])   . Gout   . Hypertension   . Hypokalemia 03/21/2016    Past Surgical History:  Procedure Laterality Date  . CAROTID ENDARTERECTOMY    . CESAREAN SECTION    . CHOLECYSTECTOMY    . COLONOSCOPY N/A 05/28/2016   Procedure: COLONOSCOPY;  Surgeon: Manus Gunning, MD;  Location: [facility] ENDOSCOPY;  Service: Gastroenterology;  Laterality: N/A;  . CORONARY ANGIOPLASTY    . CORONARY ARTERY BYPASS GRAFT    . ESOPHAGOGASTRODUODENOSCOPY N/A 05/26/2016   Procedure: ESOPHAGOGASTRODUODENOSCOPY (EGD);  Surgeon: Juanita Craver, MD;  Location: [facility] ENDOSCOPY;  Service: Endoscopy;  Laterality: N/A;  . GIVENS CAPSULE STUDY N/A 05/28/2016   Procedure: GIVENS CAPSULE STUDY;  Surgeon: Manus Gunning, MD;  Location: [place];  Service: Gastroenterology;  Laterality: N/A;     Current Outpatient Prescriptions  Medication Sig Dispense Refill  . acetaminophen (TYLENOL) 500 MG tablet Take 1 tablet (500 mg total) by mouth every 6 (six) hours as needed for mild pain (or Fever >/= 101). 30 tablet 0  . allopurinol (ZYLOPRIM) 100 MG tablet Take 100 mg by mouth daily.    Marland Kitchen  atorvastatin (LIPITOR) 40 MG tablet Take 40 mg by mouth daily.    . cholestyramine light (PREVALITE) 4 g packet Take 1 packet (4 g total) by mouth every 12 (twelve) hours. 60 packet 0  . hydrOXYzine (ATARAX/VISTARIL) 25 MG tablet Take 1-2 tablets (25-50 mg total) by mouth every 6 (six) hours as needed for itching. (Patient taking differently: Take 25-50 mg by mouth See admin instructions. Take 2 tablets (50 mg) by mouth daily at bedtime,  may also take 1 tablet (25 mg) 3 times during the day as needed for itching) 30 tablet 0  . insulin glargine (LANTUS) 100 UNIT/ML injection Inject 0.05 mLs (5 Units total) into the skin every morning. 10 mL 11  . insulin lispro (HUMALOG) 100 UNIT/ML injection Inject 2-15 Units into the skin 3 (three) times daily after meals. 2-8 units if CBG <400, takes 15 units if CBG >400 (per sliding scale)    . lactulose (CHRONULAC) 10 GM/15ML solution Take 45 mLs (30 g total) by mouth 3 (three) times daily. 240 mL 1  . metoprolol succinate (TOPROL-XL) 25 MG 24 hr tablet Take 1 tablet (25 mg total) by mouth daily. 30 tablet 0  . pantoprazole (PROTONIX) 40 MG tablet Take 40 mg by mouth daily.     . potassium chloride SA (K-DUR,KLOR-CON) 20 MEQ tablet Take 2 tablets (40 mEq total) by mouth daily. 30 tablet 1  . rifaximin (XIFAXAN) 200 MG tablet Take 2 tablets (400 mg total) by mouth 3 (three) times daily. (Patient taking differently: Take 400 mg by mouth 2 (two) times daily. ) 30 tablet 1  . torsemide (DEMADEX) 20 MG tablet TAKE 3 TABLETS BY MOUTH TWICE A DAY OR AS DIRECTED 200 tablet 3   No current facility-administered medications for this visit.     Allergies:   Contrast media [iodinated diagnostic agents]; Prednisone; and Lactose intolerance (gi)    Social History:  The patient  reports that she has quit smoking. She has never used smokeless tobacco. She reports that she does not drink alcohol or use drugs.   Family History:  The patient's family history includes Breast cancer in her mother.    ROS:  Please see the history of present illness.   Otherwise, review of systems are positive for none.   All other systems are reviewed and negative.    PHYSICAL EXAM: VS:  BP 127/81   Pulse 87   Ht  (1.575 m)   Wt 153 lb (69.4 kg)   SpO2 99%   BMI 27.98 kg/m  , BMI Body mass index is 27.98 kg/m. GENERAL:  Well appearing HEENT:  Pupils equal round and reactive, fundi not visualized, oral mucosa  unremarkable NECK:  JVP to upper neck at 45 degrees, waveform within normal limits, carotid upstroke brisk and symmetric, no bruits, no thyromegaly LYMPHATICS:  No cervical adenopathy LUNGS:  Diffuse expiratory wheezing. Diminished breath sounds. No rhonchi or crackles. HEART:  RRR.  PMI not displaced or sustained.  Cardiac auscultation was secured by loud wheezing. ABD:  Flat, positive bowel sounds normal in frequency in pitch, no bruits, no rebound, no guarding, no midline pulsatile mass, no hepatomegaly, no splenomegaly EXT:  2 plus pulses throughout, 1+ pitting edema to above the knee bilaterally., no cyanosis no clubbing SKIN:  No rashes no nodules NEURO:  Cranial nerves II through XII grossly intact, motor grossly intact throughout PSYCH:  Cognitively intact, oriented to person place and time    EKG:  EKG is ordered today. The  ekg ordered today demonstrates accelerated junctional rhythm.  Rate 86 bpm.  Prior inferior and anterolateral infarcts.   Echo 05/26/16: Study Conclusions  - Left ventricle: The cavity size was normal. Wall thickness was   increased in a pattern of mild LVH. Systolic function was mildly   to moderately reduced. The estimated ejection fraction was in the   range of 40% to 45%. - Ventricular septum: The contour showed diastolic flattening and   systolic flattening. - Aortic valve: Severely calcified annulus. - Mitral valve: There was mild to moderate regurgitation. - Left atrium: The atrium was moderately dilated. - Right ventricle: The cavity size was mildly dilated. Systolic   function was mildly to moderately reduced. - Right atrium: The atrium was mildly dilated. - Tricuspid valve: There was severe regurgitation. - Pulmonary arteries: Systolic pressure was severely increased. PA   peak pressure: 75 mm Hg (S).   Recent Labs: 04/20/2016: TSH 2.380 04/25/2016: B Natriuretic Peptide 3,143.2 05/30/2016: ALT 20 06/02/2016: Magnesium 1.8 06/04/2016: BUN 72;  Creatinine, Ser 2.80; Hemoglobin 8.6; Platelets 225; Potassium 4.0; Sodium 136    Lipid Panel    Component Value Date/Time   CHOL 123 05/24/2016 0523   TRIG 129 05/24/2016 0523   HDL 38 (L) 05/24/2016 0523   CHOLHDL 3.2 05/24/2016 0523   VLDL 26 05/24/2016 0523   LDLCALC 59 05/24/2016 0523      Wt Readings from Last 3 Encounters:  06/10/16 153 lb (69.4 kg)  06/04/16 156 lb 6.4 oz (70.9 kg)  05/03/16 153 lb 11.2 oz (69.7 kg)      ASSESSMENT AND PLAN:  # Chronic systolic and diastolic heart failure: # RV Failure: # Pulmonary hypertension: Cassandra Bonilla is volume overloaded on exam today.  I suspect that her wheezing is cardiac in nature.  She does not have a history of COPD or asthma.  We will check a BNP. We will also switch her Lasix to torsemide 60 mg twice a day. Suspect that she may have some intestinal edema that is prohibiting her from absorbing Lasix. Although her weight is stable, she has not been eating or drinking well. I suspect that she is losing muscle mass and gaining fluid.  Continue metoprolol.  She will follow-up in 2 weeks at which time we will check a basic metabolic panel. She will also be seeing her nephrologist within the week. We will call her in 2 or 3 days to get an update on her weight and fluid status.  # Chronic atrial fibrillation: Cassandra Bonilla remains in atrial fibrillation today. The rates are well-controlled. Continue metoprolol. She was recently admitted for a GI bleed and has cryptogenic cirrhosis with coagulopathy. It is unclear that she will be a candidate for anticoagulations and a long-term. She has follow-up with gastroenterology. If felt safe to resume anticoagulation, we will be happy to coordinate this. We will await to hear from gastritis and urology on this decision.  This patients CHA2DS2-VASc Score and unadjusted Ischemic Stroke Rate (% per year) is equal to 9.7 % stroke rate/year from a score of 6 Above score calculated as 1 point each if present  [CHF, HTN, DM, Vascular=MI/PAD/Aortic Plaque, Age if 65-74, or Female] Above score calculated as 2 points each if present [Age > 75, or Stroke/TIA/TE]    Current medicines are reviewed at length with the patient today.  The patient does not have concerns regarding medicines.  The following changes have been made:  Switch lasix to torsemide 60 mg bid  Labs/ tests  ordered today include:   Orders Placed This Encounter  Procedures  . CBC with Differential/Platelet  . Basic metabolic panel  . B Nat Peptide  . EKG 12-Lead   Time spent: 50 minutes-Greater than 50% of this time was spent in counseling, explanation of diagnosis, planning of further management, and coordination of care.   Disposition:   FU with Tarahji Ramthun C. Oval Linsey, MD, [facility] [place] in 2 weeks    This note was written with the assistance of speech recognition software.  Please excuse any transcriptional errors.  Signed, Atha Mcbain C. Oval Linsey, MD, [facility]  06/10/2016 5:59 PM    [facility]

## 2016-06-10 NOTE — Patient Instructions (Addendum)
Medication Instructions:  STOP FUROSEMIDE   START TORSEMIDE 60 MG TWICE A DAY  Labwork: BMET/BNP/CBC AT [facility] ON THE FIRST FLOOR  Testing/Procedures: NONE  Follow-Up: Your physician recommends that you schedule a follow-up appointment in: 2 WEEKS WITH DR [facility] OR PA/NP  Any Other Special Instructions Will Be Listed Below (If Applicable). CALL IN 2-3 DAYS WITH AN UPDATE ON YOUR WEIGHT AND HOW YOU ARE DOING    If you need a refill on your cardiac medications before your next appointment, please call your pharmacy.

## 2016-06-11 ENCOUNTER — Ambulatory Visit (INDEPENDENT_AMBULATORY_CARE_PROVIDER_SITE_OTHER): Payer: Medicare Other | Admitting: Physician Assistant

## 2016-06-11 ENCOUNTER — Inpatient Hospital Stay (HOSPITAL_COMMUNITY)
Admission: EM | Admit: 2016-06-11 | Discharge: 2016-06-27 | DRG: 286 | Disposition: A | Discharge status: Hospice | Payer: Medicare Other | Attending: Internal Medicine | Admitting: Internal Medicine

## 2016-06-11 ENCOUNTER — Encounter (HOSPITAL_COMMUNITY): Payer: Self-pay

## 2016-06-11 ENCOUNTER — Emergency Department (HOSPITAL_COMMUNITY): Payer: Medicare Other

## 2016-06-11 ENCOUNTER — Encounter: Payer: Self-pay | Admitting: Physician Assistant

## 2016-06-11 VITALS — HR 88

## 2016-06-11 DIAGNOSIS — M109 Gout, unspecified: Secondary | ICD-10-CM | POA: Diagnosis present

## 2016-06-11 DIAGNOSIS — Z91041 Radiographic dye allergy status: Secondary | ICD-10-CM

## 2016-06-11 DIAGNOSIS — Z87891 Personal history of nicotine dependence: Secondary | ICD-10-CM

## 2016-06-11 DIAGNOSIS — Z7189 Other specified counseling: Secondary | ICD-10-CM

## 2016-06-11 DIAGNOSIS — N184 Chronic kidney disease, stage 4 (severe): Secondary | ICD-10-CM | POA: Diagnosis present

## 2016-06-11 DIAGNOSIS — I5082 Biventricular heart failure: Secondary | ICD-10-CM | POA: Diagnosis present

## 2016-06-11 DIAGNOSIS — I2721 Secondary pulmonary arterial hypertension: Secondary | ICD-10-CM | POA: Diagnosis present

## 2016-06-11 DIAGNOSIS — T502X5A Adverse effect of carbonic-anhydrase inhibitors, benzothiadiazides and other diuretics, initial encounter: Secondary | ICD-10-CM | POA: Diagnosis present

## 2016-06-11 DIAGNOSIS — Z66 Do not resuscitate: Secondary | ICD-10-CM

## 2016-06-11 DIAGNOSIS — K219 Gastro-esophageal reflux disease without esophagitis: Secondary | ICD-10-CM | POA: Diagnosis present

## 2016-06-11 DIAGNOSIS — E875 Hyperkalemia: Secondary | ICD-10-CM | POA: Diagnosis present

## 2016-06-11 DIAGNOSIS — R0603 Acute respiratory distress: Secondary | ICD-10-CM | POA: Diagnosis not present

## 2016-06-11 DIAGNOSIS — I10 Essential (primary) hypertension: Secondary | ICD-10-CM | POA: Diagnosis present

## 2016-06-11 DIAGNOSIS — I5023 Acute on chronic systolic (congestive) heart failure: Secondary | ICD-10-CM

## 2016-06-11 DIAGNOSIS — J90 Pleural effusion, not elsewhere classified: Secondary | ICD-10-CM | POA: Diagnosis not present

## 2016-06-11 DIAGNOSIS — K5730 Diverticulosis of large intestine without perforation or abscess without bleeding: Secondary | ICD-10-CM | POA: Diagnosis present

## 2016-06-11 DIAGNOSIS — E871 Hypo-osmolality and hyponatremia: Secondary | ICD-10-CM | POA: Diagnosis not present

## 2016-06-11 DIAGNOSIS — Z515 Encounter for palliative care: Secondary | ICD-10-CM | POA: Diagnosis present

## 2016-06-11 DIAGNOSIS — I509 Heart failure, unspecified: Secondary | ICD-10-CM

## 2016-06-11 DIAGNOSIS — E1122 Type 2 diabetes mellitus with diabetic chronic kidney disease: Secondary | ICD-10-CM | POA: Diagnosis not present

## 2016-06-11 DIAGNOSIS — I130 Hypertensive heart and chronic kidney disease with heart failure and stage 1 through stage 4 chronic kidney disease, or unspecified chronic kidney disease: Secondary | ICD-10-CM | POA: Diagnosis not present

## 2016-06-11 DIAGNOSIS — I482 Chronic atrial fibrillation, unspecified: Secondary | ICD-10-CM | POA: Diagnosis present

## 2016-06-11 DIAGNOSIS — E1165 Type 2 diabetes mellitus with hyperglycemia: Secondary | ICD-10-CM | POA: Diagnosis present

## 2016-06-11 DIAGNOSIS — I2510 Atherosclerotic heart disease of native coronary artery without angina pectoris: Secondary | ICD-10-CM | POA: Diagnosis not present

## 2016-06-11 DIAGNOSIS — I5043 Acute on chronic combined systolic (congestive) and diastolic (congestive) heart failure: Secondary | ICD-10-CM | POA: Diagnosis present

## 2016-06-11 DIAGNOSIS — R0602 Shortness of breath: Secondary | ICD-10-CM | POA: Diagnosis not present

## 2016-06-11 DIAGNOSIS — J441 Chronic obstructive pulmonary disease with (acute) exacerbation: Secondary | ICD-10-CM | POA: Diagnosis not present

## 2016-06-11 DIAGNOSIS — Z79899 Other long term (current) drug therapy: Secondary | ICD-10-CM

## 2016-06-11 DIAGNOSIS — E785 Hyperlipidemia, unspecified: Secondary | ICD-10-CM | POA: Diagnosis present

## 2016-06-11 DIAGNOSIS — Z7901 Long term (current) use of anticoagulants: Secondary | ICD-10-CM

## 2016-06-11 DIAGNOSIS — L299 Pruritus, unspecified: Secondary | ICD-10-CM | POA: Diagnosis present

## 2016-06-11 DIAGNOSIS — I272 Pulmonary hypertension, unspecified: Secondary | ICD-10-CM | POA: Diagnosis present

## 2016-06-11 DIAGNOSIS — E739 Lactose intolerance, unspecified: Secondary | ICD-10-CM | POA: Diagnosis present

## 2016-06-11 DIAGNOSIS — R0600 Dyspnea, unspecified: Secondary | ICD-10-CM | POA: Diagnosis not present

## 2016-06-11 DIAGNOSIS — K7290 Hepatic failure, unspecified without coma: Secondary | ICD-10-CM | POA: Diagnosis present

## 2016-06-11 DIAGNOSIS — Z794 Long term (current) use of insulin: Secondary | ICD-10-CM | POA: Diagnosis not present

## 2016-06-11 DIAGNOSIS — K7682 Hepatic encephalopathy: Secondary | ICD-10-CM | POA: Diagnosis present

## 2016-06-11 DIAGNOSIS — K7460 Unspecified cirrhosis of liver: Secondary | ICD-10-CM | POA: Diagnosis not present

## 2016-06-11 DIAGNOSIS — N179 Acute kidney failure, unspecified: Secondary | ICD-10-CM

## 2016-06-11 DIAGNOSIS — E876 Hypokalemia: Secondary | ICD-10-CM | POA: Diagnosis not present

## 2016-06-11 DIAGNOSIS — I110 Hypertensive heart disease with heart failure: Secondary | ICD-10-CM | POA: Diagnosis not present

## 2016-06-11 DIAGNOSIS — Z951 Presence of aortocoronary bypass graft: Secondary | ICD-10-CM

## 2016-06-11 DIAGNOSIS — R748 Abnormal levels of other serum enzymes: Secondary | ICD-10-CM | POA: Diagnosis present

## 2016-06-11 DIAGNOSIS — I425 Other restrictive cardiomyopathy: Secondary | ICD-10-CM | POA: Diagnosis present

## 2016-06-11 DIAGNOSIS — Z9861 Coronary angioplasty status: Secondary | ICD-10-CM

## 2016-06-11 DIAGNOSIS — K7469 Other cirrhosis of liver: Secondary | ICD-10-CM | POA: Diagnosis not present

## 2016-06-11 DIAGNOSIS — Z86010 Personal history of colonic polyps: Secondary | ICD-10-CM

## 2016-06-11 DIAGNOSIS — Z888 Allergy status to other drugs, medicaments and biological substances status: Secondary | ICD-10-CM

## 2016-06-11 DIAGNOSIS — Z993 Dependence on wheelchair: Secondary | ICD-10-CM

## 2016-06-11 HISTORY — DX: Other cirrhosis of liver: K74.69

## 2016-06-11 LAB — CBC WITH DIFFERENTIAL/PLATELET
BASOS ABS: 64 {cells}/uL (ref 0–200)
Basophils Relative: 1 %
EOS ABS: 128 {cells}/uL (ref 15–500)
Eosinophils Relative: 2 %
HEMATOCRIT: 32.3 % — AB (ref 35.0–45.0)
Hemoglobin: 9.5 g/dL — ABNORMAL LOW (ref 11.7–15.5)
Lymphocytes Relative: 14 %
Lymphs Abs: 896 cells/uL (ref 850–3900)
MCH: 25.5 pg — AB (ref 27.0–33.0)
MCHC: 29.4 g/dL — ABNORMAL LOW (ref 32.0–36.0)
MCV: 86.8 fL (ref 80.0–100.0)
MPV: 10.1 fL (ref 7.5–12.5)
Monocytes Absolute: 832 cells/uL (ref 200–950)
Monocytes Relative: 13 %
NEUTROS ABS: 4480 {cells}/uL (ref 1500–7800)
NEUTROS PCT: 70 %
Platelets: 244 10*3/uL (ref 140–400)
RBC: 3.72 MIL/uL — ABNORMAL LOW (ref 3.80–5.10)
RDW: 17.9 % — ABNORMAL HIGH (ref 11.0–15.0)
WBC: 6.4 10*3/uL (ref 3.8–10.8)

## 2016-06-11 LAB — CBC
HEMATOCRIT: 33.3 % — AB (ref 36.0–46.0)
HEMATOCRIT: 32.6 % — AB (ref 36.0–46.0)
Hemoglobin: 9.6 g/dL — ABNORMAL LOW (ref 12.0–15.0)
Hemoglobin: 9.5 g/dL — ABNORMAL LOW (ref 12.0–15.0)
MCH: 25.1 pg — ABNORMAL LOW (ref 26.0–34.0)
MCH: 25.5 pg — ABNORMAL LOW (ref 26.0–34.0)
MCHC: 28.8 g/dL — AB (ref 30.0–36.0)
MCHC: 29.1 g/dL — ABNORMAL LOW (ref 30.0–36.0)
MCV: 86.9 fL (ref 78.0–100.0)
MCV: 87.4 fL (ref 78.0–100.0)
PLATELETS: 264 10*3/uL (ref 150–400)
Platelets: 271 10*3/uL (ref 150–400)
RBC: 3.83 MIL/uL — ABNORMAL LOW (ref 3.87–5.11)
RBC: 3.73 MIL/uL — ABNORMAL LOW (ref 3.87–5.11)
RDW: 18.3 % — AB (ref 11.5–15.5)
RDW: 18.1 % — AB (ref 11.5–15.5)
WBC: 5.9 10*3/uL (ref 4.0–10.5)
WBC: 5.7 10*3/uL (ref 4.0–10.5)

## 2016-06-11 LAB — TROPONIN I: Troponin I: 0.05 ng/mL (ref <0.03)

## 2016-06-11 LAB — GLUCOSE, CAPILLARY
GLUCOSE-CAPILLARY: 110 mg/dL — AB (ref 65–99)
GLUCOSE-CAPILLARY: 200 mg/dL — AB (ref 65–99)

## 2016-06-11 LAB — BLOOD GAS, ARTERIAL
Acid-base deficit: 7.2 mmol/L — ABNORMAL HIGH (ref 0.0–2.0)
BICARBONATE: 17.1 mmol/L — AB (ref 20.0–28.0)
Drawn by: 105521
FIO2: 21
O2 Saturation: 97.2 %
PH ART: 7.367 (ref 7.350–7.450)
PO2 ART: 94.9 mmHg (ref 83.0–108.0)
Patient temperature: 98.6
pCO2 arterial: 30.5 mmHg — ABNORMAL LOW (ref 32.0–48.0)

## 2016-06-11 LAB — HEPATIC FUNCTION PANEL
ALBUMIN: 2.8 g/dL — AB (ref 3.5–5.0)
ALK PHOS: 178 U/L — AB (ref 38–126)
ALT: 18 U/L (ref 14–54)
AST: 26 U/L (ref 15–41)
BILIRUBIN INDIRECT: 0.6 mg/dL (ref 0.3–0.9)
BILIRUBIN TOTAL: 1 mg/dL (ref 0.3–1.2)
Bilirubin, Direct: 0.4 mg/dL (ref 0.1–0.5)
Total Protein: 7 g/dL (ref 6.5–8.1)

## 2016-06-11 LAB — BASIC METABOLIC PANEL
Anion gap: 12 (ref 5–15)
BUN: 83 mg/dL — AB (ref 6–20)
CHLORIDE: 106 mmol/L (ref 101–111)
CO2: 18 mmol/L — ABNORMAL LOW (ref 22–32)
Calcium: 8.2 mg/dL — ABNORMAL LOW (ref 8.9–10.3)
Creatinine, Ser: 3.41 mg/dL — ABNORMAL HIGH (ref 0.44–1.00)
GFR calc Af Amer: 14 mL/min — ABNORMAL LOW (ref >60)
GFR calc non Af Amer: 12 mL/min — ABNORMAL LOW (ref >60)
GLUCOSE: 106 mg/dL — AB (ref 65–99)
POTASSIUM: 5.6 mmol/L — AB (ref 3.5–5.1)
Sodium: 136 mmol/L (ref 135–145)

## 2016-06-11 LAB — BRAIN NATRIURETIC PEPTIDE
B Natriuretic Peptide: 4262.3 pg/mL — ABNORMAL HIGH (ref 0.0–100.0)
Brain Natriuretic Peptide: >4200 pg/mL — ABNORMAL HIGH (ref <100)

## 2016-06-11 LAB — AMMONIA: AMMONIA: 64 umol/L — AB (ref 9–35)

## 2016-06-11 LAB — CREATININE, SERUM
Creatinine, Ser: 3.59 mg/dL — ABNORMAL HIGH (ref 0.44–1.00)
GFR, EST AFRICAN AMERICAN: 13 mL/min — AB (ref >60)
GFR, EST NON AFRICAN AMERICAN: 11 mL/min — AB (ref >60)

## 2016-06-11 LAB — PROTIME-INR
INR: 1.51
PROTHROMBIN TIME: 18.4 s — AB (ref 11.4–15.2)

## 2016-06-11 LAB — PHOSPHORUS: Phosphorus: 5.5 mg/dL — ABNORMAL HIGH (ref 2.5–4.6)

## 2016-06-11 LAB — POTASSIUM: Potassium: 5.9 mmol/L — ABNORMAL HIGH (ref 3.5–5.1)

## 2016-06-11 LAB — MAGNESIUM: Magnesium: 1.9 mg/dL (ref 1.7–2.4)

## 2016-06-11 LAB — PROCALCITONIN: Procalcitonin: 0.43 ng/mL

## 2016-06-11 MED ORDER — INSULIN GLARGINE 100 UNIT/ML ~~LOC~~ SOLN
5.0000 [IU] | SUBCUTANEOUS | Status: DC
  Administered 2016-06-12 – 2016-06-16 (×4): 5 [IU] via SUBCUTANEOUS
  Filled 2016-06-11 (×7): qty 0.05

## 2016-06-11 MED ORDER — ALBUTEROL SULFATE (2.5 MG/3ML) 0.083% IN NEBU
2.5000 mg | INHALATION_SOLUTION | RESPIRATORY_TRACT | Status: DC | PRN
  Filled 2016-06-11: qty 3

## 2016-06-11 MED ORDER — METHYLPREDNISOLONE SODIUM SUCC 125 MG IJ SOLR
60.0000 mg | Freq: Two times a day (BID) | INTRAMUSCULAR | Status: DC
  Administered 2016-06-11 – 2016-06-13 (×4): 60 mg via INTRAVENOUS
  Filled 2016-06-11 (×4): qty 2

## 2016-06-11 MED ORDER — ALBUTEROL (5 MG/ML) CONTINUOUS INHALATION SOLN
10.0000 mg/h | INHALATION_SOLUTION | Freq: Once | RESPIRATORY_TRACT | Status: AC
  Administered 2016-06-11: 10 mg/h via RESPIRATORY_TRACT
  Filled 2016-06-11: qty 20

## 2016-06-11 MED ORDER — METOPROLOL SUCCINATE ER 25 MG PO TB24
25.0000 mg | ORAL_TABLET | Freq: Every day | ORAL | Status: DC
  Administered 2016-06-12: 25 mg via ORAL
  Filled 2016-06-11 (×2): qty 1

## 2016-06-11 MED ORDER — LEVOFLOXACIN IN D5W 500 MG/100ML IV SOLN
500.0000 mg | Freq: Once | INTRAVENOUS | Status: AC
  Administered 2016-06-11: 500 mg via INTRAVENOUS
  Filled 2016-06-11: qty 100

## 2016-06-11 MED ORDER — HEPARIN SODIUM (PORCINE) 5000 UNIT/ML IJ SOLN
5000.0000 [IU] | Freq: Three times a day (TID) | INTRAMUSCULAR | Status: DC
  Administered 2016-06-11 – 2016-06-27 (×46): 5000 [IU] via SUBCUTANEOUS
  Filled 2016-06-11 (×47): qty 1

## 2016-06-11 MED ORDER — ONDANSETRON HCL 4 MG/2ML IJ SOLN
4.0000 mg | Freq: Four times a day (QID) | INTRAMUSCULAR | Status: DC | PRN
  Administered 2016-06-21: 4 mg via INTRAVENOUS
  Filled 2016-06-11: qty 2

## 2016-06-11 MED ORDER — LACTULOSE 10 GM/15ML PO SOLN
30.0000 g | Freq: Three times a day (TID) | ORAL | Status: DC
  Administered 2016-06-11 – 2016-06-20 (×26): 30 g via ORAL
  Filled 2016-06-11 (×27): qty 45

## 2016-06-11 MED ORDER — ALBUTEROL SULFATE (2.5 MG/3ML) 0.083% IN NEBU
INHALATION_SOLUTION | RESPIRATORY_TRACT | Status: AC
  Administered 2016-06-11: 5 mg via RESPIRATORY_TRACT
  Filled 2016-06-11: qty 6

## 2016-06-11 MED ORDER — ALBUTEROL SULFATE (2.5 MG/3ML) 0.083% IN NEBU
5.0000 mg | INHALATION_SOLUTION | Freq: Once | RESPIRATORY_TRACT | Status: AC
  Administered 2016-06-11 (×2): 5 mg via RESPIRATORY_TRACT

## 2016-06-11 MED ORDER — INSULIN ASPART 100 UNIT/ML ~~LOC~~ SOLN
0.0000 [IU] | Freq: Three times a day (TID) | SUBCUTANEOUS | Status: DC
  Administered 2016-06-12: 9 [IU] via SUBCUTANEOUS
  Administered 2016-06-12 (×2): 7 [IU] via SUBCUTANEOUS
  Administered 2016-06-13: 5 [IU] via SUBCUTANEOUS

## 2016-06-11 MED ORDER — FUROSEMIDE 10 MG/ML IJ SOLN
60.0000 mg | Freq: Two times a day (BID) | INTRAMUSCULAR | Status: DC
  Administered 2016-06-11 – 2016-06-12 (×3): 60 mg via INTRAVENOUS
  Filled 2016-06-11 (×4): qty 6

## 2016-06-11 MED ORDER — SODIUM CHLORIDE 0.9 % IV SOLN: 250.0000 mL | INTRAVENOUS | Status: DC | PRN

## 2016-06-11 MED ORDER — OXYCODONE HCL 5 MG PO TABS
5.0000 mg | ORAL_TABLET | ORAL | Status: DC | PRN
  Administered 2016-06-11: 5 mg via ORAL
  Filled 2016-06-11: qty 1

## 2016-06-11 MED ORDER — INSULIN ASPART 100 UNIT/ML ~~LOC~~ SOLN
0.0000 [IU] | Freq: Every day | SUBCUTANEOUS | Status: DC
  Administered 2016-06-15: 4 [IU] via SUBCUTANEOUS

## 2016-06-11 MED ORDER — GUAIFENESIN ER 600 MG PO TB12
1200.0000 mg | ORAL_TABLET | Freq: Two times a day (BID) | ORAL | Status: DC
  Administered 2016-06-11 – 2016-06-15 (×8): 1200 mg via ORAL
  Filled 2016-06-11 (×9): qty 2

## 2016-06-11 MED ORDER — IPRATROPIUM-ALBUTEROL 0.5-2.5 (3) MG/3ML IN SOLN
3.0000 mL | Freq: Four times a day (QID) | RESPIRATORY_TRACT | Status: DC
  Administered 2016-06-11 – 2016-06-12 (×3): 3 mL via RESPIRATORY_TRACT
  Filled 2016-06-11 (×3): qty 3

## 2016-06-11 MED ORDER — IPRATROPIUM-ALBUTEROL 0.5-2.5 (3) MG/3ML IN SOLN
3.0000 mL | RESPIRATORY_TRACT | Status: DC | PRN
  Administered 2016-06-12: 3 mL via RESPIRATORY_TRACT
  Filled 2016-06-11: qty 3

## 2016-06-11 MED ORDER — ACETAMINOPHEN 650 MG RE SUPP: 650.0000 mg | Freq: Four times a day (QID) | RECTAL | Status: DC | PRN

## 2016-06-11 MED ORDER — ONDANSETRON HCL 4 MG PO TABS: 4.0000 mg | ORAL_TABLET | Freq: Four times a day (QID) | ORAL | Status: DC | PRN

## 2016-06-11 MED ORDER — RIFAXIMIN 200 MG PO TABS
400.0000 mg | ORAL_TABLET | Freq: Three times a day (TID) | ORAL | Status: DC
  Administered 2016-06-11 – 2016-06-23 (×33): 400 mg via ORAL
  Filled 2016-06-11 (×37): qty 2

## 2016-06-11 MED ORDER — HYDROXYZINE HCL 25 MG PO TABS
25.0000 mg | ORAL_TABLET | Freq: Four times a day (QID) | ORAL | Status: DC | PRN
  Administered 2016-06-11 – 2016-06-12 (×2): 25 mg via ORAL
  Administered 2016-06-13 – 2016-06-16 (×6): 50 mg via ORAL
  Administered 2016-06-17: 25 mg via ORAL
  Administered 2016-06-22 – 2016-06-27 (×2): 50 mg via ORAL
  Filled 2016-06-11 (×2): qty 1
  Filled 2016-06-11 (×6): qty 2
  Filled 2016-06-11: qty 1
  Filled 2016-06-11 (×3): qty 2

## 2016-06-11 MED ORDER — SODIUM CHLORIDE 0.9% FLUSH
3.0000 mL | Freq: Two times a day (BID) | INTRAVENOUS | Status: DC
  Administered 2016-06-12 – 2016-06-14 (×3): 3 mL via INTRAVENOUS

## 2016-06-11 MED ORDER — SODIUM CHLORIDE 0.9% FLUSH: 3.0000 mL | INTRAVENOUS | Status: DC | PRN

## 2016-06-11 MED ORDER — IPRATROPIUM BROMIDE 0.02 % IN SOLN
0.5000 mg | Freq: Once | RESPIRATORY_TRACT | Status: AC
  Administered 2016-06-11: 0.5 mg via RESPIRATORY_TRACT
  Filled 2016-06-11: qty 2.5

## 2016-06-11 MED ORDER — LEVOFLOXACIN 500 MG PO TABS
500.0000 mg | ORAL_TABLET | ORAL | Status: AC
  Administered 2016-06-15 – 2016-06-21 (×4): 500 mg via ORAL
  Filled 2016-06-11 (×5): qty 1

## 2016-06-11 MED ORDER — PANTOPRAZOLE SODIUM 40 MG PO TBEC
40.0000 mg | DELAYED_RELEASE_TABLET | Freq: Every day | ORAL | Status: DC
  Administered 2016-06-12 – 2016-06-27 (×15): 40 mg via ORAL
  Filled 2016-06-11 (×16): qty 1

## 2016-06-11 MED ORDER — ACETAMINOPHEN 325 MG PO TABS: 650.0000 mg | ORAL_TABLET | Freq: Four times a day (QID) | ORAL | Status: DC | PRN

## 2016-06-11 NOTE — Progress Notes (Signed)
Pharmacy Antibiotic Note  Cassandra Bonilla is a 75 y.o. female admitted on 06/11/2016 with COPD exacerbation.  Pharmacy has been consulted for Levaquin dosing.  Plan: Start levofloxacin 500 mg IV x 1 dose, followed by levofloxacin 500 mg PO q 48 hours Monitor clinical progress, c/s, renal function, abx plan/LOT   Height: 5\' 2"  (157.5 cm) Weight: 162 lb 14.7 oz (73.9 kg) IBW/kg (Calculated) : 50.1  Temp (24hrs), Avg:97.7 F (36.5 C), Min:97.4 F (36.3 C), Max:97.9 F (36.6 C)   Recent Labs Lab 06/10/16 1305 06/11/16 1148  WBC 6.4 5.9  CREATININE 3.31* 3.41*    Estimated Creatinine Clearance: 13.4 mL/min (by C-G formula based on SCr of 3.41 mg/dL (H)).    Allergies  Allergen Reactions  . Contrast Media [Iodinated Diagnostic Agents] Hives  . Prednisone Other (See Comments)    Confusion  . Lactose Intolerance (Gi) Diarrhea    There are times when/if the patient consumes milk products, it results in diarrhea    Antimicrobials this admission: 9/26 Levaquin >>    Microbiology results:   Thank you for allowing [name] to participate in this patients care. Jens Som, PharmD Pager: [phone] 06/11/2016 4:35 PM

## 2016-06-11 NOTE — H&P (Signed)
History and Physical    Cassandra Bonilla [id number] DOB: Nov 26, 1940 DOA: 06/11/2016  PCP: Cassandra Collie, MD Patient coming from: home / GI office  Chief Complaint: SOB  HPI: Cassandra Bonilla is a 75 y.o. female with medical history significant of CAD, COPD,, CAD, DM, gout, HTN, presenting with 5 day history of worsening cough wheezing and shortness of breath. Patient reports one day ago saying her cardiologist to switch her from furosemide to torsemide due to concern for volume overload. No increased diuresis after changing to torsemide. Associated with generalized weakness and increased respiratory effort. Cough is occasionally productive. No home inhalers. Denies any chest pain, palpitations, fevers,  rash, dysuria, frequency, neck stiffness, headache, LOC, confusion.    ED Course: Inject the findings outlined below. Given multiple nebulizer treatments  Review of Systems: As per HPI otherwise 10 point review of systems negative.   Ambulatory Status: No significant  Past Medical History:  Diagnosis Date  . CHF (congestive heart failure) ([place])   . CKD (chronic kidney disease) stage 3, GFR 30-59 ml/min   . Colon polyps 05/28/2016   3 ascending, 3 transverse, 1 descending  . COPD (chronic obstructive pulmonary disease) ([place])   . Coronary artery disease   . Cryptogenic cirrhosis ([place])   . Diabetes mellitus without complication ([place])   . Gout   . Hypertension   . Hypokalemia 03/21/2016    Past Surgical History:  Procedure Laterality Date  . CAROTID ENDARTERECTOMY    . CESAREAN SECTION    . CHOLECYSTECTOMY    . COLONOSCOPY N/A 05/28/2016   Procedure: COLONOSCOPY;  Surgeon: Cassandra Gunning, MD;  Location: [facility] ENDOSCOPY;  Service: Gastroenterology;  Laterality: N/A;  . CORONARY ANGIOPLASTY    . CORONARY ARTERY BYPASS GRAFT    . ESOPHAGOGASTRODUODENOSCOPY N/A 05/26/2016   Procedure: ESOPHAGOGASTRODUODENOSCOPY (EGD);  Surgeon: Cassandra Craver, MD;  Location: [facility] ENDOSCOPY;  Service:  Endoscopy;  Laterality: N/A;  . GIVENS CAPSULE STUDY N/A 05/28/2016   Procedure: GIVENS CAPSULE STUDY;  Surgeon: Cassandra Gunning, MD;  Location: [place];  Service: Gastroenterology;  Laterality: N/A;    Social History   Social History  . Marital status: Married    Spouse name: N/A  . Number of children: N/A  . Years of education: N/A   Occupational History  . Not on file.   Social History Main Topics  . Smoking status: Former Research scientist (life sciences)  . Smokeless tobacco: Never Used     Comment: quit smoking  in 1993  . Alcohol use No  . Drug use: No  . Sexual activity: Not on file   Other Topics Concern  . Not on file   Social History Narrative  . No narrative on file    Allergies  Allergen Reactions  . Contrast Media [Iodinated Diagnostic Agents] Hives  . Prednisone Other (See Comments)    Confusion  . Lactose Intolerance (Gi) Diarrhea    There are times when/if the patient consumes milk products, it results in diarrhea    Family History  Problem Relation Age of Onset  . Breast cancer Mother     Prior to Admission medications   Medication Sig Start Date End Date Taking? Authorizing Provider  acetaminophen (TYLENOL) 500 MG tablet Take 1 tablet (500 mg total) by mouth every 6 (six) hours as needed for mild pain (or Fever >/= 101). 06/04/16   Silver Huguenin Elgergawy, MD  allopurinol (ZYLOPRIM) 100 MG tablet Take 100 mg by mouth daily.    Historical Provider, MD  atorvastatin (LIPITOR) 40  MG tablet Take 40 mg by mouth daily.    Historical Provider, MD  cholestyramine light (PREVALITE) 4 g packet Take 1 packet (4 g total) by mouth every 12 (twelve) hours. 06/04/16   Silver Huguenin Elgergawy, MD  hydrOXYzine (ATARAX/VISTARIL) 25 MG tablet Take 1-2 tablets (25-50 mg total) by mouth every 6 (six) hours as needed for itching. Patient taking differently: Take 25-50 mg by mouth See admin instructions. Take 2 tablets (50 mg) by mouth daily at bedtime, may also take 1 tablet (25 mg) 3 times  during the day as needed for itching 03/24/16   Cassandra Poisson, MD  insulin glargine (LANTUS) 100 UNIT/ML injection Inject 0.05 mLs (5 Units total) into the skin every morning. 06/04/16   Silver Huguenin Elgergawy, MD  insulin lispro (HUMALOG) 100 UNIT/ML injection Inject 2-15 Units into the skin 3 (three) times daily after meals. 2-8 units if CBG <400, takes 15 units if CBG >400 (per sliding scale)    Historical Provider, MD  lactulose (CHRONULAC) 10 GM/15ML solution Take 45 mLs (30 g total) by mouth 3 (three) times daily. 06/04/16   Silver Huguenin Elgergawy, MD  metoprolol succinate (TOPROL-XL) 25 MG 24 hr tablet Take 1 tablet (25 mg total) by mouth daily. 06/05/16   Silver Huguenin Elgergawy, MD  pantoprazole (PROTONIX) 40 MG tablet Take 40 mg by mouth daily.     Historical Provider, MD  potassium chloride SA (K-DUR,KLOR-CON) 20 MEQ tablet Take 2 tablets (40 mEq total) by mouth daily. 05/03/16   Cassandra Cellar, MD  rifaximin (XIFAXAN) 200 MG tablet Take 2 tablets (400 mg total) by mouth 3 (three) times daily. Patient taking differently: Take 400 mg by mouth 2 (two) times daily.  05/03/16   Cassandra Cellar, MD  torsemide (DEMADEX) 20 MG tablet TAKE 3 TABLETS BY MOUTH TWICE A DAY OR AS DIRECTED 06/10/16   Cassandra Latch, MD    Physical Exam: Vitals:   06/11/16 1245 06/11/16 1300 06/11/16 1315 06/11/16 1433  BP: 164/68 175/74 159/81 (!) 148/82  Pulse: 79 88 97 (!) 104  Resp: 16 15 (!) 27 20  Temp:    97.4 F (36.3 C)  TempSrc:    Oral  SpO2: 100% 100% 99% 100%  Weight:    73.9 kg (162 lb 14.7 oz)  Height:    5\' 2"  (1.575 m)     General: appears sleepy, resting in bed Eyes:  PERRL, EOMI, normal lids, iris ENT:  grossly normal hearing, lips & tongue, mmm Neck:  no LAD, masses or thyromegaly Cardiovascular:  Difficult to appreciate cardiac sounds due to respiratory exam RRR, 1+ LE edema.  Respiratory: Diffuse wheezing with diminished breath sounds throughout. Increased effort. Crackles in the bases Abdomen:   soft, ntnd, NABS Skin:  no rash or induration seen on limited exam Musculoskeletal:  grossly normal tone BUE/BLE, good ROM, no bony abnormality Psychiatric:  grossly normal mood and affect, speech fluent and appropriate, AOx3 Neurologic:  CN 2-12 grossly intact, moves all extremities in coordinated fashion, sensation intact  Labs on Admission: I have personally reviewed following labs and imaging studies  CBC:  Recent Labs Lab 06/10/16 1305 06/11/16 1148  WBC 6.4 5.9  NEUTROABS 4,480  --   HGB 9.5* 9.6*  HCT 32.3* 33.3*  MCV 86.8 86.9  PLT 244 [id number]   Basic Metabolic Panel:  Recent Labs Lab 06/10/16 1305 06/11/16 1148 06/11/16 1247  NA 134* 136  --   K 5.8* 5.6* 5.9*  CL 103 106  --  CO2 19* 18*  --   GLUCOSE 64* 106*  --   BUN 80* 83*  --   CREATININE 3.31* 3.41*  --   CALCIUM 8.5* 8.2*  --    GFR: Estimated Creatinine Clearance: 13.4 mL/min (by C-G formula based on SCr of 3.41 mg/dL (H)). Liver Function Tests:  Recent Labs Lab 06/11/16 1148  AST 26  ALT 18  ALKPHOS 178*  BILITOT 1.0  PROT 7.0  ALBUMIN 2.8*   No results for input(s): LIPASE, AMYLASE in the last 168 hours. No results for input(s): AMMONIA in the last 168 hours. Coagulation Profile:  Recent Labs Lab 06/11/16 1148  INR 1.51   Cardiac Enzymes: No results for input(s): CKTOTAL, CKMB, CKMBINDEX, TROPONINI in the last 168 hours. BNP (last 3 results) No results for input(s): PROBNP in the last 8760 hours. HbA1C: No results for input(s): HGBA1C in the last 72 hours. CBG: No results for input(s): GLUCAP in the last 168 hours. Lipid Profile: No results for input(s): CHOL, HDL, LDLCALC, TRIG, CHOLHDL, LDLDIRECT in the last 72 hours. Thyroid Function Tests: No results for input(s): TSH, T4TOTAL, FREET4, T3FREE, THYROIDAB in the last 72 hours. Anemia Panel: No results for input(s): VITAMINB12, FOLATE, FERRITIN, TIBC, IRON, RETICCTPCT in the last 72 hours. Urine analysis:    Component  Value Date/Time   COLORURINE YELLOW 05/25/2016 [place] 05/25/2016 1725   LABSPEC 1.014 05/25/2016 1725   PHURINE 6.0 05/25/2016 1725   GLUCOSEU NEGATIVE 05/25/2016 1725   HGBUR NEGATIVE 05/25/2016 1725   BILIRUBINUR NEGATIVE 05/25/2016 1725   KETONESUR NEGATIVE 05/25/2016 1725   PROTEINUR NEGATIVE 05/25/2016 1725   NITRITE NEGATIVE 05/25/2016 1725   LEUKOCYTESUR NEGATIVE 05/25/2016 1725    Creatinine Clearance: Estimated Creatinine Clearance: 13.4 mL/min (by C-G formula based on SCr of 3.41 mg/dL (H)).  Sepsis Labs: @LABRCNTIP (procalcitonin:4,lacticidven:4) )No results found for this or any previous visit (from the past 240 hour(s)).   Radiological Exams on Admission: Dg Chest 2 View  Result Date: 06/11/2016 CLINICAL DATA:  Patient with wheezing and shortness of breath for 1 week. EXAM: CHEST  2 VIEW COMPARISON:  Chest radiograph 05/25/2016. FINDINGS: Stable cardiomegaly status post median sternotomy. Pulmonary vascular redistribution and bilateral perihilar interstitial opacities. Small bilateral pleural effusions. Thoracic spine degenerative changes. IMPRESSION: Cardiomegaly and mild interstitial pulmonary edema. Electronically Signed   By: Lovey Newcomer M.D.   On: 06/11/2016 11:25    EKG: Independently reviewed.    Assessment/Plan Active Problems:   Type 2 diabetes mellitus with stage 4 chronic kidney disease, with long-term current use of insulin (HCC)   Cryptogenic cirrhosis of liver (HCC)   COPD exacerbation (HCC)   Hyperkalemia   Respiratory distress   CHF exacerbation (HCC)   Respiratory distress: multifactorial from COPD exacerbation and CHF exacerbation. CXR w/ cardiomegaly and pulmonary edema. Not hypoxic but increased effort and somewhat sleepy, BNP 4262, intermittent tachypnea. Afebrile and nml WBC. No change in diuresis despite being changed from lasix to torsemide 60mg  daily.  - ABG - Echo, lasix 60 iv BID, trop - duonebs, solumedrol, mucinex,  levaquin - Procalcitonin  DM: - continue lantus 5 units QAM - SSI  Hyperkalemia: 5.9 on admission.   CKD: Cr 3.3. At baseline. Anticipate elevation in the am.  - BMET in am  Cryptogenic cirrhosis: mentation at baseline - ammonia - continue Lactulose, Xifaxan  Gout: - continue allopurinol  HLD: - continue statin  HTN: - continue metop  GERD: - continue protonix   DVT prophylaxis: hep  Code Status: full  Family Communication: son  Disposition Plan: pending improvement  Consults called: none  Admission status: tele, inpt.     Abdulloh Ullom J MD [facility]  If 7PM-7AM, please contact night-coverage [email or web address] Password [facility]  06/11/2016, 4:32 PM

## 2016-06-11 NOTE — Patient Instructions (Signed)
Go to the [facility] Emergency Department.   Amy Esterwood PA is calling there to let them know she is coming.

## 2016-06-11 NOTE — Progress Notes (Signed)
Attempted to give pt medications. Pt IV to R AC flushed with ease. Pt did not appear to have any pain when flushing IV. IV abx attempted to be administered to pt, however 10 min into administration, pt crying and says "IV hurts so much". IV site not red nor appears infiltrated. Pt keeps crying out in pain asking "Cassandra Bonilla, please help me. Please take this out." Due to patient distress and increased workload in respirations IV was removed. IV abx stopped. IV lasix not given. IV solumedrol not given. IV team consult placed. Will continue to monitor and administer medications when IV is placed.

## 2016-06-11 NOTE — Progress Notes (Signed)
CRITICAL VALUE ALERT  Critical value received:  Troponin 0.05   Date of notification:  06/11/16  Time of notification:  [id number]  Critical value read back: yes  Nurse who received alert:  Maurene Capes RN   MD notified (1st page):  Bhagat PA  Time of first page:  [phone]

## 2016-06-11 NOTE — ED Notes (Signed)
Attempted to obtain blood. Unsuccessful. Pt will be roomed and blood will be obtained.

## 2016-06-11 NOTE — Progress Notes (Signed)
New pt admission from ED. Pt brought to the floor. Pt has audible wheezing. Vitals taken. Initial Assessment done. All immediate pertinent needs to patient addressed. Patient Guide given to patient. Important safety instructions relating to hospitalization reviewed with patient. Patient verbalized understanding. Will continue to monitor pt.  Maurene Capes RN

## 2016-06-11 NOTE — ED Triage Notes (Addendum)
Pt here with family who reports cough X5 days. She has audible wheezing. Family reports staff at PCP was unable to obtain BP. Pt denies chest pain or shortness of breath.

## 2016-06-11 NOTE — ED Provider Notes (Signed)
[facility] DEPT Provider Note   CSN: [id number] Arrival date & time: 06/11/16  1031     History   Chief Complaint Chief Complaint  Patient presents with  . Cough  . Wheezing    HPI Cassandra Bonilla is a 75 y.o. female.  HPI   Patient to the ER with PMH of CHF, CKD, COPD, CAD, gout, hypertension, hypokalemia, diabetes cocems to the ER with cough and wheezing for the past 5 days. The patient was at her cardiologist yesterday and they changed her medication for Furosamide to Torsemide because they felt like she was fluid overload. She saw her gastroenteriologist today and they felt like she should come to the ER to be seen. Pt is wheel chair bound due to her chronic weakness and SOB.  She says that she feels weak all over but is not tired of breathing. Her husband who is in the room says she only sounds a little bit worse than normal and that her effort of breathing is how it has been for the last 5 days. O2 sats is 100 % on room air but she has audible wheezing.  Denies fevers, N/V/D, CP, SOB, back pain, abdominal pain, rash, confusion.  Past Medical History:  Diagnosis Date  . CHF (congestive heart failure) ([place])   . CKD (chronic kidney disease) stage 3, GFR 30-59 ml/min   . Colon polyps 05/28/2016   3 ascending, 3 transverse, 1 descending  . COPD (chronic obstructive pulmonary disease) ([place])   . Coronary artery disease   . Diabetes mellitus without complication ([place])   . Gout   . Hypertension   . Hypokalemia 03/21/2016    Patient Active Problem List   Diagnosis Date Noted  . COPD exacerbation ([place]) 06/11/2016  . Palliative care encounter   . Goals of care, counseling/discussion   . 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 Pulmonary hypertension ([place])   . Uncontrolled type 2 diabetes mellitus with complication ([place])   . Blood in stool   . GIB (gastrointestinal bleeding) 05/24/2016  . Encephalopathy, hepatic ([place]) 05/24/2016  . GI bleed 05/24/2016  . Abnormal  alkaline phosphatase test   . Acute encephalopathy 04/22/2016  . Acute on chronic renal failure ([place]) 04/22/2016  . Fluid overload 04/20/2016  . Hypokalemia 03/22/2016  . Pruritus 03/22/2016  . Anemia of chronic disease 03/22/2016  . Chronic atrial fibrillation ([place]) 03/22/2016  . Type 2 diabetes mellitus with stage 4 chronic kidney disease, with long-term current use of insulin ([place]) 03/22/2016  . Acute on chronic systolic (congestive) heart failure ([place]) 03/22/2016  . CKD (chronic kidney disease), stage IV ([place]) 03/22/2016    Past Surgical History:  Procedure Laterality Date  . CAROTID ENDARTERECTOMY    . CESAREAN SECTION    . CHOLECYSTECTOMY    . COLONOSCOPY N/A 05/28/2016   Procedure: COLONOSCOPY;  Surgeon: Manus Gunning, MD;  Location: [facility] ENDOSCOPY;  Service: Gastroenterology;  Laterality: N/A;  . CORONARY ANGIOPLASTY    . CORONARY ARTERY BYPASS GRAFT    . ESOPHAGOGASTRODUODENOSCOPY N/A 05/26/2016   Procedure: ESOPHAGOGASTRODUODENOSCOPY (EGD);  Surgeon: Juanita Craver, MD;  Location[facility] ENDOSCOPY;  Service: Endoscopy;  Laterality: N/A;  . GIVENS CAPSULE STUDY N/A 05/28/2016   Procedure: GIVENS CAPSULE STUDY;  Surgeon: Manus Gunning, MD;  Location: [facility];  Service: Gastroenterology;  Laterality: N/A;    OB History    No data available       Home Medications    Prior to Admission medications   Medication Sig  Start Date End Date Taking? Authorizing Provider  acetaminophen (TYLENOL) 500 MG tablet Take 1 tablet (500 mg total) by mouth every 6 (six) hours as needed for mild pain (or Fever >/= 101). 06/04/16   Silver Huguenin Elgergawy, MD  allopurinol (ZYLOPRIM) 100 MG tablet Take 100 mg by mouth daily.    Historical Provider, MD  atorvastatin (LIPITOR) 40 MG tablet Take 40 mg by mouth daily.    Historical Provider, MD  cholestyramine light (PREVALITE) 4 g packet Take 1 packet (4 g total) by mouth every 12 (twelve) hours. 06/04/16   Silver Huguenin Elgergawy, MD   hydrOXYzine (ATARAX/VISTARIL) 25 MG tablet Take 1-2 tablets (25-50 mg total) by mouth every 6 (six) hours as needed for itching. Patient taking differently: Take 25-50 mg by mouth See admin instructions. Take 2 tablets (50 mg) by mouth daily at bedtime, may also take 1 tablet (25 mg) 3 times during the day as needed for itching 03/24/16   Hosie Poisson, MD  insulin glargine (LANTUS) 100 UNIT/ML injection Inject 0.05 mLs (5 Units total) into the skin every morning. 06/04/16   Silver Huguenin Elgergawy, MD  insulin lispro (HUMALOG) 100 UNIT/ML injection Inject 2-15 Units into the skin 3 (three) times daily after meals. 2-8 units if CBG <400, takes 15 units if CBG >400 (per sliding scale)    Historical Provider, MD  lactulose (CHRONULAC) 10 GM/15ML solution Take 45 mLs (30 g total) by mouth 3 (three) times daily. 06/04/16   Silver Huguenin Elgergawy, MD  metoprolol succinate (TOPROL-XL) 25 MG 24 hr tablet Take 1 tablet (25 mg total) by mouth daily. 06/05/16   Silver Huguenin Elgergawy, MD  pantoprazole (PROTONIX) 40 MG tablet Take 40 mg by mouth daily.     Historical Provider, MD  potassium chloride SA (K-DUR,KLOR-CON) 20 MEQ tablet Take 2 tablets (40 mEq total) by mouth daily. 05/03/16   Kelvin Cellar, MD  rifaximin (XIFAXAN) 200 MG tablet Take 2 tablets (400 mg total) by mouth 3 (three) times daily. Patient taking differently: Take 400 mg by mouth 2 (two) times daily.  05/03/16   Kelvin Cellar, MD  torsemide (DEMADEX) 20 MG tablet TAKE 3 TABLETS BY MOUTH TWICE A DAY OR AS DIRECTED 06/10/16   Skeet Latch, MD    Family History Family History  Problem Relation Age of Onset  . Breast cancer Mother     Social History Social History  Substance Use Topics  . Smoking status: Former Research scientist (life sciences)  . Smokeless tobacco: Never Used     Comment: quit smoking  in 1993  . Alcohol use No     Allergies   Contrast media [iodinated diagnostic agents]; Prednisone; and Lactose intolerance (gi)   Review of Systems Review of  Systems Review of Systems All other systems negative except as documented in the HPI. All pertinent positives and negatives as reviewed in the HPI.   Physical Exam Updated Vital Signs BP 175/74   Pulse 88   Temp 97.9 F (36.6 C) (Oral)   Resp 15   SpO2 100%   Physical Exam  Constitutional: She appears well-developed and well-nourished. No distress.  HENT:  Head: Normocephalic and atraumatic.  Right Ear: Tympanic membrane and ear canal normal.  Left Ear: Tympanic membrane and ear canal normal.  Nose: Nose normal.  Mouth/Throat: Uvula is midline, oropharynx is clear and moist and mucous membranes are normal.  Eyes: Pupils are equal, round, and reactive to light.  Neck: Normal range of motion. Neck supple.  Cardiovascular: Normal rate and  regular rhythm.   Pulmonary/Chest: No respiratory distress. She has wheezes (diffuse severe wheezing on inspriation and expiration). She has no rhonchi. She has no rales.  Abdominal: Soft.  No signs of abdominal distention  Musculoskeletal:  No LE swelling  Neurological: She is alert.  Acting at baseline  Skin: Skin is warm and dry. No rash noted. She is not diaphoretic.  Nursing note and vitals reviewed.   ED Treatments / Results  Labs (all labs ordered are listed, but only abnormal results are displayed) Labs Reviewed  BASIC METABOLIC PANEL - Abnormal; Notable for the following:       Result Value   Potassium 5.6 (*)    CO2 18 (*)    Glucose, Bld 106 (*)    BUN 83 (*)    Creatinine, Ser 3.41 (*)    Calcium 8.2 (*)    GFR calc non Af Amer 12 (*)    GFR calc Af Amer 14 (*)    All other components within normal limits  CBC - Abnormal; Notable for the following:    RBC 3.83 (*)    Hemoglobin 9.6 (*)    HCT 33.3 (*)    MCH 25.1 (*)    MCHC 28.8 (*)    RDW 18.3 (*)    All other components within normal limits  BRAIN NATRIURETIC PEPTIDE - Abnormal; Notable for the following:    B Natriuretic Peptide 4,262.3 (*)    All other  components within normal limits  HEPATIC FUNCTION PANEL - Abnormal; Notable for the following:    Albumin 2.8 (*)    Alkaline Phosphatase 178 (*)    All other components within normal limits  PROTIME-INR - Abnormal; Notable for the following:    Prothrombin Time 18.4 (*)    All other components within normal limits  POTASSIUM    EKG  EKG Interpretation  Date/Time:  Tuesday June 11 2016 10:48:43 EDT Ventricular Rate:  85 PR Interval:    QRS Duration: 82 QT Interval:  364 QTC Calculation: 433 R Axis:   88 Text Interpretation:  Accelerated Junctional rhythm Cannot rule out Inferior infarct , age undetermined Cannot rule out Anterior infarct , age undetermined Abnormal ECG Since prior ECG, this ECG appears regular, less likely atrial fibrillation, more likely junctional rhythm, no other acute  changes from prior Confirmed by [facility] MD, [place] ([id number]) on 06/11/2016 12:46:45 PM       Radiology Dg Chest 2 View  Result Date: 06/11/2016 CLINICAL DATA:  Patient with wheezing and shortness of breath for 1 week. EXAM: CHEST  2 VIEW COMPARISON:  Chest radiograph 05/25/2016. FINDINGS: Stable cardiomegaly status post median sternotomy. Pulmonary vascular redistribution and bilateral perihilar interstitial opacities. Small bilateral pleural effusions. Thoracic spine degenerative changes. IMPRESSION: Cardiomegaly and mild interstitial pulmonary edema. Electronically Signed   By: Lovey Newcomer M.D.   On: 06/11/2016 11:25    Procedures Procedures (including critical care time)  Medications Ordered in ED Medications  albuterol (PROVENTIL) (2.5 MG/3ML) 0.083% nebulizer solution 5 mg (5 mg Nebulization Given 06/11/16 1043)  albuterol (PROVENTIL,VENTOLIN) solution continuous neb (10 mg/hr Nebulization Given 06/11/16 1210)  ipratropium (ATROVENT) nebulizer solution 0.5 mg (0.5 mg Nebulization Given 06/11/16 1210)     At end of shift patient sign out to [employer], PA-C.  Patient has severe  wheezing and coughing- Chest xray shows cardiomegaly and mild interstitial pulmonary edema. Patient has chronic kidney disease and today it appears to be at baseline. However her potassium is 5.6, will recheck potassium  level before treating. To treat her wheezing she has been started on an hour long nebulizer treatment. Dr. Billy Fischer will have to obtain bedside US guided IV.  Patients BNP is in the 4,000's will admit for COPD exacerbation and CHF exacerbation  Initial Impression / Assessment and Plan / ED Course  I have reviewed the triage vital signs and the nursing notes.  Pertinent labs & imaging results that were available during my care of the patient were reviewed by me and considered in my medical decision making (see chart for details).  Clinical Course      Final Clinical Impressions(s) / ED Diagnoses   Final diagnoses:  COPD exacerbation ([place])  Acute on chronic systolic congestive heart failure [facility])    New Prescriptions New Prescriptions   No medications on file      Delos Haring, PA-C 06/11/16 1312    Gareth Morgan, MD 06/14/16 1059

## 2016-06-11 NOTE — Progress Notes (Signed)
Subjective:    Patient ID: Cassandra Bonilla, female    DOB: 1941-04-11, 75 y.o.   MRN: [id number]  HPI Cassandra Bonilla is a 75 year old African-American female, new to GI recently established with Dr. Carlean Purl when seen in the hospital in consultation. She had an admission in August and again in September. She was admitted in August with congestive heart failure but also seen for cirrhosis/cryptogenic, possibly cardiac cirrhosis apt she was encephalopathic at that time and started on lactulose and Xifaxan. She was diuresed and discharged home on Lasix 120 mg twice a day. He underwent liver biopsy during that hospitalization, She was readmitted 911 through 06/04/2016 with melena. She was on Coumadin at the time for atrial fibrillation. She underwent EGD which was negative, colonoscopy with finding of 5 polyps the largest 1 cm all were removed and were adenomatous. She also had multiple left colon diverticuli. She also had capsule endoscopy which was negative. Due to her multiple comorbidities, cirrhosis and GI bleeding decision was made to take her off of Coumadin. Other medical problems include atrial fibrillation, chronic kidney disease stage IV, pulmonary hypertension congestive heart failure with EF of 40-45% and coronary artery disease status post remote CABG. Last hemoglobin done 06/04/2016 hemoglobin 8.6 hematocrit of 28.7 venous ammonia was 74. Patient brought in by her husband today for follow-up. He says that she has been confused since discharge from the hospital some days worse than others she is able to ambulate a few steps with help over the past few days has been complaining of lightheadedness and increased shortness of breath with walking. She developed a cough about 5 days ago which is wet. No fever or chills. He says her appetite is been very poor. She has been taking all of her medications and is usually having 2-3 formed stools per day. I noticed that she was placed on Questran twice daily at the  time of her last hospitalization but I'm not sure why she is taking this. Patient was seen by cardiology yesterday and felt to be volume overloaded and her diuretic was changed .  Review of Systems Pertinent positive and negative review of systems were noted in the above HPI section.  All other review of systems was otherwise negative.  No facility-administered encounter medications on file as of 06/11/2016.    Outpatient Encounter Prescriptions as of 06/11/2016  Medication Sig  . acetaminophen (TYLENOL) 500 MG tablet Take 1 tablet (500 mg total) by mouth every 6 (six) hours as needed for mild pain (or Fever >/= 101).  Marland Kitchen allopurinol (ZYLOPRIM) 100 MG tablet Take 100 mg by mouth daily.  Marland Kitchen atorvastatin (LIPITOR) 40 MG tablet Take 40 mg by mouth daily.  . cholestyramine light (PREVALITE) 4 g packet Take 1 packet (4 g total) by mouth every 12 (twelve) hours.  . hydrOXYzine (ATARAX/VISTARIL) 25 MG tablet Take 1-2 tablets (25-50 mg total) by mouth every 6 (six) hours as needed for itching. (Patient taking differently: Take 25-50 mg by mouth See admin instructions. Take 2 tablets (50 mg) by mouth daily at bedtime, may also take 1 tablet (25 mg) 3 times during the day as needed for itching)  . insulin glargine (LANTUS) 100 UNIT/ML injection Inject 0.05 mLs (5 Units total) into the skin every morning.  . insulin lispro (HUMALOG) 100 UNIT/ML injection Inject 2-15 Units into the skin 3 (three) times daily after meals. 2-8 units if CBG <400, takes 15 units if CBG >400 (per sliding scale)  . lactulose (CHRONULAC) 10 GM/15ML solution Take  45 mLs (30 g total) by mouth 3 (three) times daily.  . metoprolol succinate (TOPROL-XL) 25 MG 24 hr tablet Take 1 tablet (25 mg total) by mouth daily.  . pantoprazole (PROTONIX) 40 MG tablet Take 40 mg by mouth daily.   . potassium chloride SA (K-DUR,KLOR-CON) 20 MEQ tablet Take 2 tablets (40 mEq total) by mouth daily.  . rifaximin (XIFAXAN) 200 MG tablet Take 2 tablets (400 mg  total) by mouth 3 (three) times daily. (Patient taking differently: Take 400 mg by mouth 2 (two) times daily. )  . torsemide (DEMADEX) 20 MG tablet TAKE 3 TABLETS BY MOUTH TWICE A DAY OR AS DIRECTED   Allergies  Allergen Reactions  . Contrast Media [Iodinated Diagnostic Agents] Hives  . Prednisone Other (See Comments)    Confusion  . Lactose Intolerance (Gi) Diarrhea    There are times when/if the patient consumes milk products, it results in diarrhea   Patient Active Problem List   Diagnosis Date Noted  . COPD exacerbation ([place]) 06/11/2016  . Palliative care encounter   . Goals of care, counseling/discussion   . 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 Pulmonary hypertension ([place])   . Uncontrolled type 2 diabetes mellitus with complication ([place])   . Blood in stool   . GIB (gastrointestinal bleeding) 05/24/2016  . Encephalopathy, hepatic ([place]) 05/24/2016  . GI bleed 05/24/2016  . Abnormal alkaline phosphatase test   . Acute encephalopathy 04/22/2016  . Acute on chronic renal failure ([place]) 04/22/2016  . Fluid overload 04/20/2016  . Hypokalemia 03/22/2016  . Pruritus 03/22/2016  . Anemia of chronic disease 03/22/2016  . Chronic atrial fibrillation ([place]) 03/22/2016  . Type 2 diabetes mellitus with stage 4 chronic kidney disease, with long-term current use of insulin ([place]) 03/22/2016  . Acute on chronic systolic (congestive) heart failure ([place]) 03/22/2016  . CKD (chronic kidney disease), stage IV ([place]) 03/22/2016   Social History   Social History  . Marital status: Married    Spouse name: N/A  . Number of children: N/A  . Years of education: N/A   Occupational History  . Not on file.   Social History Main Topics  . Smoking status: Former Research scientist (life sciences)  . Smokeless tobacco: Never Used     Comment: quit smoking  in 1993  . Alcohol use No  . Drug use: No  . Sexual activity: Not on file   Other Topics Concern  . Not on file   Social History Narrative  . No narrative on file     Cassandra Bonilla's family history includes Breast cancer in her mother.      Objective:    Vitals:   06/11/16 0911  Pulse: 88    Physical Exam  well-developed chronically ill-appearing African-American female in a wheelchair who is sleeping for much of the visit but arouses when spoken to. She has a very wet cough. Patient accompanied by her husband Blood pressure difficult to obtain in office today, 100/60 pulse 88. HEENT; nontraumatic normocephalic EOMI PERRLA sclera anicteric, Cardiovascular; regular rate and rhythm with S1-S2, pulmonary ;diffuse rhonchi and wheezes, decreased breath sounds at the bases, Abdomen; soft nontender nondistended bowel sounds are active, Ext; brawny edema bilateral lower extremities to the knees, neuropsych; patient oriented to name and she is disoriented to day month year and president, no asterixis       Assessment & Plan:   #79 75 year old African-American female with history of congestive heart failure with 5 day history of cough, increased weakness,  poor oral intake, and rhonchi and wheezing on exam. Am concerned about acute CHF exacerbation, or possible underlying pneumonia  #2 confusion-patient is disoriented to day date month and president today-she has chronic hepatic encephalopathy though expect this is worse than her baseline #3 chronic kidney disease stage IV #5 coronary artery disease status post CABG #6.onset diabetes mellitus #7 history of atrial fibrillation #8 recent admission with melena-complete GI workup with EGD colonoscopy and capsule endoscopy unrevealing as to source though suspect small bowel AVMs -Coumadin was discontinued #9 multiple adenomatous colon polyps found at recent colonoscopy #10 Cirrhosis-liver biopsy done 05/01/2016 showed paddocks sinusoidal dilation and vague old noncaseating granulomatous inflammation with differential including congestive heart failure, , renal occlusive disease or possible drug-related  changes  Plan  Unfortunately I believe she is acutely ill today and needs to be evaluated in the ER for further workup of possible acute CHF. She ALSO appears to be encephalopathic despite taking her meds as instructed.  Do not think Questran is a good choice for this patient as it may be affecting absorption of her other multiple medications.  advised to  stop Questran. Discussed  situation at length with her husband and advised to take her to [facility] ER. She will need follow-up appointment with Dr. Carlean Purl after acute decompensation improved  Amy Genia Harold PA-C 06/11/2016   Cc: Marco Collie, MD

## 2016-06-12 DIAGNOSIS — I272 Other secondary pulmonary hypertension: Secondary | ICD-10-CM

## 2016-06-12 DIAGNOSIS — Z794 Long term (current) use of insulin: Secondary | ICD-10-CM

## 2016-06-12 DIAGNOSIS — K219 Gastro-esophageal reflux disease without esophagitis: Secondary | ICD-10-CM

## 2016-06-12 DIAGNOSIS — K7290 Hepatic failure, unspecified without coma: Secondary | ICD-10-CM

## 2016-06-12 DIAGNOSIS — I10 Essential (primary) hypertension: Secondary | ICD-10-CM | POA: Diagnosis present

## 2016-06-12 DIAGNOSIS — I482 Chronic atrial fibrillation: Secondary | ICD-10-CM

## 2016-06-12 DIAGNOSIS — E875 Hyperkalemia: Secondary | ICD-10-CM

## 2016-06-12 DIAGNOSIS — I5043 Acute on chronic combined systolic (congestive) and diastolic (congestive) heart failure: Secondary | ICD-10-CM

## 2016-06-12 DIAGNOSIS — M109 Gout, unspecified: Secondary | ICD-10-CM | POA: Diagnosis present

## 2016-06-12 LAB — COMPREHENSIVE METABOLIC PANEL
ALBUMIN: 2.8 g/dL — AB (ref 3.5–5.0)
ALT: 18 U/L (ref 14–54)
AST: 26 U/L (ref 15–41)
Alkaline Phosphatase: 184 U/L — ABNORMAL HIGH (ref 38–126)
Anion gap: 16 — ABNORMAL HIGH (ref 5–15)
BILIRUBIN TOTAL: 1.4 mg/dL — AB (ref 0.3–1.2)
BUN: 84 mg/dL — AB (ref 6–20)
CHLORIDE: 104 mmol/L (ref 101–111)
CO2: 16 mmol/L — ABNORMAL LOW (ref 22–32)
CREATININE: 3.65 mg/dL — AB (ref 0.44–1.00)
Calcium: 8.3 mg/dL — ABNORMAL LOW (ref 8.9–10.3)
GFR calc Af Amer: 13 mL/min — ABNORMAL LOW (ref >60)
GFR, EST NON AFRICAN AMERICAN: 11 mL/min — AB (ref >60)
GLUCOSE: 322 mg/dL — AB (ref 65–99)
POTASSIUM: 5.7 mmol/L — AB (ref 3.5–5.1)
Sodium: 136 mmol/L (ref 135–145)
Total Protein: 7 g/dL (ref 6.5–8.1)

## 2016-06-12 LAB — CBC
HEMATOCRIT: 32.7 % — AB (ref 36.0–46.0)
Hemoglobin: 9.5 g/dL — ABNORMAL LOW (ref 12.0–15.0)
MCH: 25.1 pg — ABNORMAL LOW (ref 26.0–34.0)
MCHC: 29.1 g/dL — ABNORMAL LOW (ref 30.0–36.0)
MCV: 86.5 fL (ref 78.0–100.0)
PLATELETS: 274 10*3/uL (ref 150–400)
RBC: 3.78 MIL/uL — AB (ref 3.87–5.11)
RDW: 18.3 % — AB (ref 11.5–15.5)
WBC: 3.4 10*3/uL — AB (ref 4.0–10.5)

## 2016-06-12 LAB — GLUCOSE, CAPILLARY
GLUCOSE-CAPILLARY: 345 mg/dL — AB (ref 65–99)
Glucose-Capillary: 309 mg/dL — ABNORMAL HIGH (ref 65–99)
Glucose-Capillary: 384 mg/dL — ABNORMAL HIGH (ref 65–99)
Glucose-Capillary: 306 mg/dL — ABNORMAL HIGH (ref 65–99)

## 2016-06-12 MED ORDER — ALLOPURINOL 100 MG PO TABS
100.0000 mg | ORAL_TABLET | Freq: Every day | ORAL | Status: DC
  Administered 2016-06-12 – 2016-06-27 (×15): 100 mg via ORAL
  Filled 2016-06-12 (×16): qty 1

## 2016-06-12 MED ORDER — SODIUM CHLORIDE 0.9% FLUSH: 3.0000 mL | INTRAVENOUS | Status: DC | PRN

## 2016-06-12 MED ORDER — SODIUM CHLORIDE 0.9% FLUSH: 3.0000 mL | Freq: Two times a day (BID) | INTRAVENOUS | Status: DC

## 2016-06-12 MED ORDER — CHOLESTYRAMINE LIGHT 4 G PO PACK
4.0000 g | PACK | Freq: Two times a day (BID) | ORAL | Status: DC
  Administered 2016-06-12 – 2016-06-15 (×6): 4 g via ORAL
  Filled 2016-06-12 (×9): qty 1

## 2016-06-12 MED ORDER — SODIUM CHLORIDE 0.9 % IV SOLN: 250.0000 mL | INTRAVENOUS | Status: DC | PRN

## 2016-06-12 MED ORDER — SODIUM CHLORIDE 0.9 % IV SOLN
Freq: Once | INTRAVENOUS | Status: AC
  Administered 2016-06-12: 19:00:00 via INTRAVENOUS

## 2016-06-12 MED ORDER — ATORVASTATIN CALCIUM 40 MG PO TABS
40.0000 mg | ORAL_TABLET | Freq: Every day | ORAL | Status: DC
  Administered 2016-06-12: 40 mg via ORAL
  Filled 2016-06-12: qty 1

## 2016-06-12 NOTE — Progress Notes (Signed)
Inpatient Diabetes Program Recommendations  AACE/ADA: New Consensus Statement on Inpatient Glycemic Control (2015)  Target Ranges:  Prepandial:   less than 140 mg/dL      Peak postprandial:   less than 180 mg/dL (1-2 hours)      Critically ill patients:  140 - 180 mg/dL   Lab Results  Component Value Date   GLUCAP 345 (H) 06/12/2016   HGBA1C 7.4 (H) 05/24/2016    Review of Glycemic Control  Diabetes history: DM2 Outpatient Diabetes medications: Lantus 5 units QHS, Humalog 2-15 units tidwc Current orders for Inpatient glycemic control: Lantus 5 units QAM, Novolog 0-9 units tidwc.  Steroid-induced hyperglycemia. Good glycemic control at home with HgbA1C of 7.4%.  Inpatient Diabetes Program Recommendations:    Add CHO mod med to 2 gram sodium diet. Add Novolog 3 units tidwc for meal coverage insulin.  Thank you. Lorenda Peck, RD, LDN, CDE Inpatient Diabetes Coordinator [phone]

## 2016-06-12 NOTE — Progress Notes (Signed)
ANTIBIOTIC CONSULT NOTE   Pharmacy Consult for Levaquin Indication: COPD exac  Allergies  Allergen Reactions  . Contrast Media [Iodinated Diagnostic Agents] Hives  . Prednisone Other (See Comments)    Confusion  . Lactose Intolerance (Gi) Diarrhea    There are times when/if the patient consumes milk products, it results in diarrhea    Patient Measurements: Height: 5\' 2"  (157.5 cm) Weight: 162 lb 14.4 oz (73.9 kg) IBW/kg (Calculated) : 50.1 Adjusted Body Weight:    Vital Signs: Temp: 98.5 F (36.9 C) (09/27 0436) Temp Source: Oral (09/27 0436) BP: 152/81 (09/27 0436) Pulse Rate: 82 (09/27 0436) Intake/Output from previous day: 09/26 0701 - 09/27 0700 In: 480 [P.O.:480] Out: -  Intake/Output from this shift: No intake/output data recorded.  Labs:  Recent Labs  06/11/16 1148 06/11/16 1805 06/12/16 0341  WBC 5.9 5.7 3.4*  HGB 9.6* 9.5* 9.5*  PLT 271 264 274  CREATININE 3.41* 3.59* 3.65*   Estimated Creatinine Clearance: 12.5 mL/min (by C-G formula based on SCr of 3.65 mg/dL (H)). No results for input(s): VANCOTROUGH, VANCOPEAK, VANCORANDOM, GENTTROUGH, GENTPEAK, GENTRANDOM, TOBRATROUGH, TOBRAPEAK, TOBRARND, AMIKACINPEAK, AMIKACINTROU, AMIKACIN in the last 72 hours.   Microbiology:  Medical History: Past Medical History:  Diagnosis Date  . CHF (congestive heart failure) ([place])   . CKD (chronic kidney disease) stage 3, GFR 30-59 ml/min   . Colon polyps 05/28/2016   3 ascending, 3 transverse, 1 descending  . COPD (chronic obstructive pulmonary disease) ([place])   . Coronary artery disease   . Cryptogenic cirrhosis ([place])   . Diabetes mellitus without complication ([place])   . Gout   . Hypertension   . Hypokalemia 03/21/2016    Assessment: Cassandra Bonilla is a 75 y.o. female admitted on 06/11/2016 with COPD exacerbation. Based on her level ok CKD and Scr 3.65 (up) today, dose will not need further adjustment.  Goal of Therapy:  Eradication of infection  Plan:   Levaquin 500mg  po q 48h Pharmacy will sign off. Please reconsult for further dosing assitance.    Cassandra Bonilla, PharmD, BCPS Clinical Staff Pharmacist Pager [phone]  Cassandra Bonilla 06/12/2016,8:22 AM

## 2016-06-12 NOTE — Progress Notes (Signed)
PROGRESS NOTE    Cassandra Bonilla  [id number] DOB: [birth year] DOA: 06/11/2016 PCP: Marco Collie, MD      Brief Narrative:  Cassandra Bonilla is a 75 y.o. female with medical history significant of CAD, COPD, CAD, DM, gout, HTN, presenting with 5 day history of worsening cough wheezing and shortness of breath. Patient reports one day ago saying her cardiologist to switch her from furosemide to torsemide due to concern for volume overload. No increased diuresis after changing to torsemide. Associated with generalized weakness and increased respiratory effort. Cough is occasionally productive. No home inhalers. Patient was admitted for respiratory distress due to COPD exacerbation and CHF exacerbation.   Assessment & Plan:   Principal Problem:   Acute on chronic combined systolic and diastolic heart failure (HCC) Active Problems:   Chronic atrial fibrillation (HCC)   Type 2 diabetes mellitus with stage 4 chronic kidney disease, with long-term current use of insulin (HCC)   CKD (chronic kidney disease), stage IV (HCC)   Encephalopathy, hepatic (HCC)   Cryptogenic cirrhosis of liver (HCC)   COPD exacerbation (HCC)   Hyperkalemia   Respiratory distress   Gout   GERD (gastroesophageal reflux disease)   Essential hypertension   Acute on chronic combined systolic and diastolic CHF exacerbation and pHTN  -BNP on admission 4262  -EF on 04/21/16: 30-35%, echo showed global hypokinesis with akinesis of inferior and inf-lat wall  -EF on 05/26/16: 40-45%  -Admitted for heart failure in August, discharged on lasix 120mg  BID, then switched to torsemide as outpatient   -Lasix 60mg  BID IV   -Daily weights  -Strict I/Os - I/Os not accurately charted this morning  -Consult cardiology (saw Dr. Skeet Latch outpatient on 9/25). Paged cards master this morning.   COPD exacerbation -Levaquin for total 5 doses  -Duonebs  -Solumedrol 60mg  q12h    Elevated troponin -Likely secondary to CHF exacerbation as  well as decreased renal function -No chest pain  -No ST changes on admission EKG   Chronic atrial fibrillation  -Recently admitted for GI bleed, coumadin held  -CHA2DS2-VASc 6 -Continue metoprolol   DM type 2, well controlled, with complication including CKD stage 4-5  -9/8 hemoglobin A1c= 7.4 -Continue lantus 5 units QAM -SSI  Hyperkalemia -Lasix as above -Monitor   CKD stage 4-5  -Cr fluctuates between 2.5-3.5 at baseline  -Monitor BMP  -[facility]  Hepatic encephalopathy and cryptogenic cirrhosis: previously admitted with hepatic encephalopathy  -Continue Lactulose, Xifaxan -Follows [facility]   Gout -Continue allopurinol  HTN -Continue metoprolol   GERD: -Continue protonix   DVT prophylaxis: subq hep  Code Status: Full Family Communication: no family at bedside Disposition Plan: Continue present treatment. Consult cardiology for assistance with diuresis.   Consultants:   Cardiology   Procedures:   None  Antimicrobials:  Levaquin 9/26 >>    Subjective: Patient is confused this morning. She is unable to name the hospital, the month. She recalls seeing outpatient physicians prior to admission, but cannot tell me further history regarding her hospitalization. Today, she denies any chest pains or shortness of breath. She states that she has been taking all of her medications as prescribed, has been having several bowel movements, but has had decreased urine output.  Objective: Vitals:   06/12/16 0135 06/12/16 0159 06/12/16 0436 06/12/16 0743  BP:   (!) 152/81   Pulse: 92  82   Resp: 18 18 18    Temp:   98.5 F (36.9 C)  TempSrc:   Oral   SpO2: 100%  99% 97%  Weight:   73.9 kg (162 lb 14.4 oz)   Height:        Intake/Output Summary (Last 24 hours) at 06/12/16 1049 Last data filed at 06/12/16 1016  Gross per 24 hour  Intake              655 ml  Output                0 ml  Net               655 ml   Filed Weights   06/11/16 1433 06/12/16 0436  Weight: 73.9 kg (162 lb 14.7 oz) 73.9 kg (162 lb 14.4 oz)    Examination:  General exam: Appears calm and comfortable  Respiratory system: Wheezing throughout, crackles worse on left but present bilaterally. Respiratory effort normal. Cardiovascular system: S1 & S2 heard, irregular rhythm. +1 pedal edema. Gastrointestinal system: Abdomen is nondistended, soft and nontender. No organomegaly or masses felt. Normal bowel sounds heard. Central nervous system: Alert and oriented to self only.  Extremities: Symmetric Skin: No rashes, lesions or ulcers Psychiatry: Judgement and insight appear poor  Data Reviewed: I have personally reviewed following labs and imaging studies  CBC:  Recent Labs Lab 06/10/16 1305 06/11/16 1148 06/11/16 1805 06/12/16 0341  WBC 6.4 5.9 5.7 3.4*  NEUTROABS 4,480  --   --   --   HGB 9.5* 9.6* 9.5* 9.5*  HCT 32.3* 33.3* 32.6* 32.7*  MCV 86.8 86.9 87.4 86.5  PLT 244 271 264 [id number]   Basic Metabolic Panel:  Recent Labs Lab 06/10/16 1305 06/11/16 1148 06/11/16 1247 06/11/16 1805 06/12/16 0341  NA 134* 136  --   --  136  K 5.8* 5.6* 5.9*  --  5.7*  CL 103 106  --   --  104  CO2 19* 18*  --   --  16*  GLUCOSE 64* 106*  --   --  322*  BUN 80* 83*  --   --  84*  CREATININE 3.31* 3.41*  --  3.59* 3.65*  CALCIUM 8.5* 8.2*  --   --  8.3*  MG  --   --   --  1.9  --   PHOS  --   --   --  5.5*  --    GFR: Estimated Creatinine Clearance: 12.5 mL/min (by C-G formula based on SCr of 3.65 mg/dL (H)). Liver Function Tests:  Recent Labs Lab 06/11/16 1148 06/12/16 0341  AST 26 26  ALT 18 18  ALKPHOS 178* 184*  BILITOT 1.0 1.4*  PROT 7.0 7.0  ALBUMIN 2.8* 2.8*   No results for input(s): LIPASE, AMYLASE in the last 168 hours.  Recent Labs Lab 06/11/16 1821  AMMONIA 64*   Coagulation Profile:  Recent Labs Lab 06/11/16 1148  INR 1.51   Cardiac Enzymes:  Recent Labs Lab  06/11/16 1805  TROPONINI 0.05*   BNP (last 3 results) No results for input(s): PROBNP in the last 8760 hours. HbA1C: No results for input(s): HGBA1C in the last 72 hours. CBG:  Recent Labs Lab 06/11/16 1721 06/11/16 2143 06/12/16 0644  GLUCAP 110* 200* 309*   Lipid Profile: No results for input(s): CHOL, HDL, LDLCALC, TRIG, CHOLHDL, LDLDIRECT in the last 72 hours. Thyroid Function Tests: No results for input(s): TSH, T4TOTAL, FREET4, T3FREE, THYROIDAB in the last 72 hours. Anemia Panel: No results for input(s): VITAMINB12, FOLATE, FERRITIN, TIBC, IRON, RETICCTPCT  in the last 72 hours. Sepsis Labs:  Recent Labs Lab 06/11/16 1805  PROCALCITON 0.43    No results found for this or any previous visit (from the past 240 hour(s)).       Radiology Studies: Dg Chest 2 View  Result Date: 06/11/2016 CLINICAL DATA:  Patient with wheezing and shortness of breath for 1 week. EXAM: CHEST  2 VIEW COMPARISON:  Chest radiograph 05/25/2016. FINDINGS: Stable cardiomegaly status post median sternotomy. Pulmonary vascular redistribution and bilateral perihilar interstitial opacities. Small bilateral pleural effusions. Thoracic spine degenerative changes. IMPRESSION: Cardiomegaly and mild interstitial pulmonary edema. Electronically Signed   By: Lovey Newcomer M.D.   On: 06/11/2016 11:25        Scheduled Meds: . allopurinol  100 mg Oral Daily  . cholestyramine light  4 g Oral Q12H  . furosemide  60 mg Intravenous BID  . guaiFENesin  1,200 mg Oral BID  . heparin  5,000 Units Subcutaneous Q8H  . insulin aspart  0-5 Units Subcutaneous QHS  . insulin aspart  0-9 Units Subcutaneous TID WC  . insulin glargine  5 Units Subcutaneous BH-q7a  . lactulose  30 g Oral TID  . [START ON 06/13/2016] levofloxacin  500 mg Oral Q48H  . methylPREDNISolone (SOLU-MEDROL) injection  60 mg Intravenous Q12H  . metoprolol succinate  25 mg Oral Daily  . pantoprazole  40 mg Oral Daily  . rifaximin  400 mg Oral  TID  . sodium chloride flush  3 mL Intravenous Q12H   Continuous Infusions:    LOS: 1 day    Time spent: 40 minutes    Dessa Phi, DO [facility] Pager [phone]  If 7PM-7AM, please contact night-coverage [email or web address] Password [facility] 06/12/2016, 10:49 AM

## 2016-06-12 NOTE — Consult Note (Signed)
Cardiology Consult    Patient ID: Cassandra Bonilla MRN: [id number], DOB/AGE: March 24, 1941   Admit date: 06/11/2016 Date of Consult: 06/12/2016  Primary Physician: Marco Collie, MD Primary Cardiologist: Dr. Oval Linsey Requesting Provider: Dr. Maylene Roes Reason for Consultation: CHF  Patient Profile    75 yo female with PMH of chronic systolic and diastolic heart failure (LVEF 40-45%), chronic atrial fibrillation COPD, carotid stenosis s/p CEA, CAD s/p CABG, diabetes mellitus, CKD 4, GI bleed, cryptogenic cirrhosis, and severe pulmonary hypertension who presented with worsening dyspnea.   Past Medical History   Past Medical History:  Diagnosis Date  . CHF (congestive heart failure) ([place])   . CKD (chronic kidney disease) stage 3, GFR 30-59 ml/min   . Colon polyps 05/28/2016   3 ascending, 3 transverse, 1 descending  . COPD (chronic obstructive pulmonary disease) ([place])   . Coronary artery disease   . Cryptogenic cirrhosis ([place])   . Diabetes mellitus without complication ([place])   . Gout   . Hypertension   . Hypokalemia 03/21/2016    Past Surgical History:  Procedure Laterality Date  . CAROTID ENDARTERECTOMY    . CESAREAN SECTION    . CHOLECYSTECTOMY    . COLONOSCOPY N/A 05/28/2016   Procedure: COLONOSCOPY;  Surgeon: Manus Gunning, MD;  Location: [facility] ENDOSCOPY;  Service: Gastroenterology;  Laterality: N/A;  . CORONARY ANGIOPLASTY    . CORONARY ARTERY BYPASS GRAFT    . ESOPHAGOGASTRODUODENOSCOPY N/A 05/26/2016   Procedure: ESOPHAGOGASTRODUODENOSCOPY (EGD);  Surgeon: Juanita Craver, MD;  Location: [facility] ENDOSCOPY;  Service: Endoscopy;  Laterality: N/A;  . GIVENS CAPSULE STUDY N/A 05/28/2016   Procedure: GIVENS CAPSULE STUDY;  Surgeon: Manus Gunning, MD;  Location: [facility];  Service: Gastroenterology;  Laterality: N/A;     Allergies  Allergies  Allergen Reactions  . Contrast Media [Iodinated Diagnostic Agents] Hives  . Prednisone Other (See Comments)    Confusion  . Lactose  Intolerance (Gi) Diarrhea    There are times when/if the patient consumes milk products, it results in diarrhea    History of Present Illness    [name] is a 75 yo with PMH of chronic systolic and diastolic heart failure (LVEF 40-45%), chronic atrial fibrillation COPD, carotid stenosis s/p CEA, CAD s/p CABG, diabetes mellitus, CKD 4, GI bleed, cryptogenic cirrhosis, and severe pulmonary hypertension. She is followed in the office by Dr. Oval Linsey. Appears that she was admitted to [facility] with GI bleed.  INR was 3.7 on admission. This was reversed with vitamin K and FFP. She underwent colonoscopy and was found to have diverticulosis. Several polyps were removed, but there was no clear etiology of the bleeding.  She also had a capsule endoscopy that did not reveal a bleeding source. Warfarin was held throughout hospitalization and was not restarted at discharge. Appears she had follow up with GI regarding restarting her anticoagulation.   She was seen in the office on 9/25 where she reported audible wheezing and a cough over the past 4-5 days. The decision was made to which her lasix to torsemide 60mg  BID, but noted that her weight was stable. Felt at that time that she may be loosing muscle and gaining fluid. BNP was checked and noted at 4262, up from 3143 back in august. Her weight in the office was 153lbs.   Last 2D echo showed EF of 40-45% with mild to moderate MR, moderately dilated left atrium, and severe TR.   She presented to the GI office on 9/26 and noted that she  appeared fluid overloaded, suggested that she come to the ED for further evaluation.   In the ED her labs showed K+ 5.6, Cr 3.41 (baseline appears between 2.5~3.5), albumin 2.8, Hgb 9.6, Trop 0.05. Chest x-ray showed mild interstitial pulmonary edema. EKG showed accelerated junctional rhythm. She was admitted to Internal Medicine for further management and started on IV lasix 60mg  BID.   Inpatient Medications      . allopurinol  100 mg Oral Daily  . cholestyramine light  4 g Oral Q12H  . furosemide  60 mg Intravenous BID  . guaiFENesin  1,200 mg Oral BID  . heparin  5,000 Units Subcutaneous Q8H  . insulin aspart  0-5 Units Subcutaneous QHS  . insulin aspart  0-9 Units Subcutaneous TID WC  . insulin glargine  5 Units Subcutaneous BH-q7a  . lactulose  30 g Oral TID  . [START ON 06/13/2016] levofloxacin  500 mg Oral Q48H  . methylPREDNISolone (SOLU-MEDROL) injection  60 mg Intravenous Q12H  . metoprolol succinate  25 mg Oral Daily  . pantoprazole  40 mg Oral Daily  . rifaximin  400 mg Oral TID  . sodium chloride flush  3 mL Intravenous Q12H    Family History    Family History  Problem Relation Age of Onset  . Breast cancer Mother     Social History    Social History   Social History  . Marital status: Married    Spouse name: N/A  . Number of children: N/A  . Years of education: N/A   Occupational History  . Not on file.   Social History Main Topics  . Smoking status: Former Research scientist (life sciences)  . Smokeless tobacco: Never Used     Comment: quit smoking  in 1993  . Alcohol use No  . Drug use: No  . Sexual activity: Not on file   Other Topics Concern  . Not on file   Social History Narrative  . No narrative on file     Review of Systems    General:  No chills, fever, night sweats or weight changes.  Cardiovascular:  See HPI Dermatological: No rash, lesions/masses Respiratory: No cough, dyspnea Urologic: No hematuria, dysuria Abdominal:   No nausea, vomiting, diarrhea, bright red blood per rectum, melena, or hematemesis Neurologic:  No visual changes, wkns, changes in mental status. All other systems reviewed and are otherwise negative except as noted above.  Physical Exam    Blood pressure 137/79, pulse 82, temperature 98.4 F (36.9 C), temperature source Oral, resp. rate 18, height 5\' 2"  (1.575 m), weight 162 lb 14.4 oz (73.9 kg), SpO2 100 %.  General: Pleasant older AA  female, NAD Psych: Normal affect. Neuro: Alert and oriented X 3. Moves all extremities spontaneously. HEENT: Normal  Neck: Supple without bruits or JVD. Lungs:  Resp regular and unlabored, Mild expiratory wheezing. Faint crackles bilaterally. Heart: Irregularly irregular no s3, s4, 4/6 systolic murmur. Abdomen: Soft, non-tender, non-distended, BS + x 4.  Extremities: No clubbing, cyanosis, 1+ lower extremity edema. DP/PT/Radials 2+ and equal bilaterally.  Labs    Troponin (Point of Care Test) No results for input(s): TROPIPOC in the last 72 hours.  Recent Labs  06/11/16 1805  TROPONINI 0.05*   Lab Results  Component Value Date   WBC 3.4 (L) 06/12/2016   HGB 9.5 (L) 06/12/2016   HCT 32.7 (L) 06/12/2016   MCV 86.5 06/12/2016   PLT 274 06/12/2016    Recent Labs Lab 06/12/16 0341  NA 136  K  5.7*  CL 104  CO2 16*  BUN 84*  CREATININE 3.65*  CALCIUM 8.3*  PROT 7.0  BILITOT 1.4*  ALKPHOS 184*  ALT 18  AST 26  GLUCOSE 322*   Lab Results  Component Value Date   CHOL 123 05/24/2016   HDL 38 (L) 05/24/2016   LDLCALC 59 05/24/2016   TRIG 129 05/24/2016   No results found for: [name]   Radiology Studies    Dg Chest 2 View  Result Date: 06/11/2016 CLINICAL DATA:  Patient with wheezing and shortness of breath for 1 week. EXAM: CHEST  2 VIEW COMPARISON:  Chest radiograph 05/25/2016. FINDINGS: Stable cardiomegaly status post median sternotomy. Pulmonary vascular redistribution and bilateral perihilar interstitial opacities. Small bilateral pleural effusions. Thoracic spine degenerative changes. IMPRESSION: Cardiomegaly and mild interstitial pulmonary edema. Electronically Signed   By: Lovey Newcomer M.D.   On: 06/11/2016 11:25    ECG & Cardiac Imaging    EKG: Accelerated junctional rhythm  Echo: 05/26/16  Study Conclusions  - Left ventricle: The cavity size was normal. Wall thickness was   increased in a pattern of mild LVH. Systolic function was mildly   to  moderately reduced. The estimated ejection fraction was in the   range of 40% to 45%. - Ventricular septum: The contour showed diastolic flattening and   systolic flattening. - Aortic valve: Severely calcified annulus. - Mitral valve: There was mild to moderate regurgitation. - Left atrium: The atrium was moderately dilated. - Right ventricle: The cavity size was mildly dilated. Systolic   function was mildly to moderately reduced. - Right atrium: The atrium was mildly dilated. - Tricuspid valve: There was severe regurgitation. - Pulmonary arteries: Systolic pressure was severely increased. PA   peak pressure: 75 mm Hg (S).  Assessment & Plan    75 yo female with PMH of chronic systolic and diastolic heart failure (LVEF 40-45%), chronic atrial fibrillation COPD, carotid stenosis s/p CEA, CAD s/p CABG, diabetes mellitus, CKD 4, GI bleed, cryptogenic cirrhosis, and severe pulmonary hypertension who presented with worsening dyspnea.   1. Acute on Chronic combined CHF vs pulmonary HTN: Saw seen in the office on 9/25 and noted to have audible wheezing and cough. This was thought to be cardiac in nature and her lasix was changed to torsemide 60mg  BID. She denies any orthopnea or PND at home, even states that she has not been SOB, only wheezing. Last BNP was elevated from 3143>>4262. Her chest xray only showed mild interstitial edema, does not appear to be volume overloaded on exam. Currently on IV lasix 60mg  BID, but Cr and BUN with noted rise.  -- May need to hold lasix given elevation in Cr? BNP is elevated with compared to reading in august. On last 2D echo her PA pressure was noted at 75. Last weight noted in the office on 9/25 was 153lbs, and now noted at 162lbs. I question whether this is accurate? No documented outpt noted, but did have urine in [facility].  -- Discussed with Dr. Ellyn Hack, will place for [facility] tomorrow to further assess pulmonary HTN.   2. COPD: Question whether she respiratory issues  are more so related to underlying COPD? Difficult to determine as she has been receiving both Nebs, and lasix.   3. CKD IV: Appears that her baseline is around 2.5.-3.5, current 3.6 this admission with IV lasix. Seems reasonable to consider nephrology consult regarding further management, given noted elevated K+ this admission as well.   4. Hx of encephalopathy and cryptogenic  cirrhosis: On lactulose, ammonia level 64 yesterday. Followed by [facility]  5. Chronic Atrial Fib: Recently admitted for GI bleed and her coumadin was held. Currently in atrial fib on telemetry but rates controlled.  -- Continue on metoprolol   Signed, Reino Bellis, NP-C Pager [phone] 06/12/2016, 2:46 PM

## 2016-06-12 NOTE — Progress Notes (Signed)
Nutrition Brief Note  Nutrition consult received for assessment of nutrition status/recommendations.   Wt Readings from Last 15 Encounters:  06/12/16 162 lb 14.4 oz (73.9 kg)  06/10/16 153 lb (69.4 kg)  06/04/16 156 lb 6.4 oz (70.9 kg)  05/03/16 153 lb 11.2 oz (69.7 kg)  03/22/16 149 lb (67.6 kg)    Body mass index is 29.79 kg/m. Patient meets criteria for Overweight based on current BMI. Pt appears well-nourished per nutrition-focused physical exam. Assessment difficult due to patient confusion. Patient reports eating poorly for 2 weeks PTA due to SOB, but reports eating well now. She denies any weight loss stating that she usually weighs around 150 lbs. RD reviewed pt's diet recall which consists primarily of biscuits, soup, and chicken salad or tuna salad. Pt had chips at bedside at time of visit.  RD emphasized the importance of consistent adequate healthful PO intake. Encouraged patient to eat 3 consistent meals daily. Discussed low sodium diet though patient seemed to have difficulty grasping information provided. RD stressed the importance of not adding salt to food, reviewed high sodium foods to avoid, and discussed low sodium food options. RD provided "Low Sodium Nutrition Therapy" handout from the Academy of Nutrition and Dietetics. RD name and contact information provided.   Current diet order is Regular, patient is consuming approximately 75% of meals at this time. Labs and medications reviewed.   Will change diet order to Low Sodium. No additional nutrition interventions warranted at this time. If nutrition issues arise, please consult RD.   Scarlette Ar RD, CSP, LDN Inpatient Clinical Dietitian Pager: [phone] After Hours Pager: [phone]

## 2016-06-13 ENCOUNTER — Inpatient Hospital Stay (HOSPITAL_COMMUNITY): Payer: Medicare Other

## 2016-06-13 ENCOUNTER — Encounter (HOSPITAL_COMMUNITY): Payer: Self-pay | Admitting: Internal Medicine

## 2016-06-13 ENCOUNTER — Encounter (HOSPITAL_COMMUNITY)
Admission: EM | Disposition: A | Discharge status: Hospice | Payer: Self-pay | Source: Home / Self Care | Attending: Internal Medicine

## 2016-06-13 HISTORY — PX: CARDIAC CATHETERIZATION: SHX172

## 2016-06-13 LAB — BASIC METABOLIC PANEL
Anion gap: 13 (ref 5–15)
BUN: 95 mg/dL — AB (ref 6–20)
CO2: 16 mmol/L — ABNORMAL LOW (ref 22–32)
CREATININE: 3.53 mg/dL — AB (ref 0.44–1.00)
Calcium: 8 mg/dL — ABNORMAL LOW (ref 8.9–10.3)
Chloride: 103 mmol/L (ref 101–111)
GFR, EST AFRICAN AMERICAN: 14 mL/min — AB (ref >60)
GFR, EST NON AFRICAN AMERICAN: 12 mL/min — AB (ref >60)
Glucose, Bld: 267 mg/dL — ABNORMAL HIGH (ref 65–99)
POTASSIUM: 5.3 mmol/L — AB (ref 3.5–5.1)
SODIUM: 132 mmol/L — AB (ref 135–145)

## 2016-06-13 LAB — CBC WITH DIFFERENTIAL/PLATELET
BASOS ABS: 0 10*3/uL (ref 0.0–0.1)
Basophils Relative: 0 %
EOS ABS: 0 10*3/uL (ref 0.0–0.7)
Eosinophils Relative: 0 %
HCT: 30 % — ABNORMAL LOW (ref 36.0–46.0)
Hemoglobin: 8.9 g/dL — ABNORMAL LOW (ref 12.0–15.0)
LYMPHS ABS: 0.2 10*3/uL — AB (ref 0.7–4.0)
LYMPHS PCT: 5 %
MCH: 25.1 pg — ABNORMAL LOW (ref 26.0–34.0)
MCHC: 29.7 g/dL — AB (ref 30.0–36.0)
MCV: 84.7 fL (ref 78.0–100.0)
Monocytes Absolute: 0.2 10*3/uL (ref 0.1–1.0)
Monocytes Relative: 5 %
NEUTROS ABS: 2.7 10*3/uL (ref 1.7–7.7)
Neutrophils Relative %: 90 %
Platelets: 242 10*3/uL (ref 150–400)
RBC: 3.54 MIL/uL — ABNORMAL LOW (ref 3.87–5.11)
RDW: 18.3 % — AB (ref 11.5–15.5)
WBC: 3.1 10*3/uL — ABNORMAL LOW (ref 4.0–10.5)

## 2016-06-13 LAB — GLUCOSE, CAPILLARY
GLUCOSE-CAPILLARY: 261 mg/dL — AB (ref 65–99)
GLUCOSE-CAPILLARY: 189 mg/dL — AB (ref 65–99)
Glucose-Capillary: 226 mg/dL — ABNORMAL HIGH (ref 65–99)
Glucose-Capillary: 227 mg/dL — ABNORMAL HIGH (ref 65–99)
Glucose-Capillary: 229 mg/dL — ABNORMAL HIGH (ref 65–99)
Glucose-Capillary: 214 mg/dL — ABNORMAL HIGH (ref 65–99)

## 2016-06-13 LAB — POCT I-STAT 3, VENOUS BLOOD GAS (G3P V)
Acid-base deficit: 7 mmol/L — ABNORMAL HIGH (ref 0.0–2.0)
Acid-base deficit: 9 mmol/L — ABNORMAL HIGH (ref 0.0–2.0)
BICARBONATE: 19.1 mmol/L — AB (ref 20.0–28.0)
Bicarbonate: 17.9 mmol/L — ABNORMAL LOW (ref 20.0–28.0)
O2 SAT: 46 %
O2 Saturation: 45 %
PCO2 VEN: 39.4 mmHg — AB (ref 44.0–60.0)
PCO2 VEN: 40.9 mmHg — AB (ref 44.0–60.0)
PH VEN: 7.276 (ref 7.250–7.430)
PO2 VEN: 28 mmHg — AB (ref 32.0–45.0)
PO2 VEN: 29 mmHg — AB (ref 32.0–45.0)
TCO2: 19 mmol/L (ref 0–100)
TCO2: 20 mmol/L (ref 0–100)
pH, Ven: 7.265 (ref 7.250–7.430)

## 2016-06-13 LAB — CARBOXYHEMOGLOBIN
CARBOXYHEMOGLOBIN: 1.8 % — AB (ref 0.5–1.5)
Methemoglobin: 0.7 % (ref 0.0–1.5)
O2 SAT: 60.1 %
TOTAL HEMOGLOBIN: 8.4 g/dL — AB (ref 12.0–16.0)

## 2016-06-13 LAB — PROCALCITONIN: PROCALCITONIN: 0.34 ng/mL

## 2016-06-13 LAB — MRSA PCR SCREENING: MRSA by PCR: NEGATIVE

## 2016-06-13 SURGERY — RIGHT HEART CATH
Anesthesia: LOCAL

## 2016-06-13 MED ORDER — LIDOCAINE HCL (PF) 1 % IJ SOLN
INTRAMUSCULAR | Status: AC
  Filled 2016-06-13: qty 30

## 2016-06-13 MED ORDER — SODIUM CHLORIDE 0.9% FLUSH
10.0000 mL | Freq: Two times a day (BID) | INTRAVENOUS | Status: DC
  Administered 2016-06-13 – 2016-06-27 (×21): 10 mL

## 2016-06-13 MED ORDER — MIDAZOLAM HCL 2 MG/2ML IJ SOLN
INTRAMUSCULAR | Status: AC
  Filled 2016-06-13: qty 2

## 2016-06-13 MED ORDER — SODIUM CHLORIDE 0.9% FLUSH: 3.0000 mL | INTRAVENOUS | Status: DC | PRN

## 2016-06-13 MED ORDER — SODIUM CHLORIDE 0.9% FLUSH: 10.0000 mL | INTRAVENOUS | Status: DC | PRN

## 2016-06-13 MED ORDER — PREDNISONE 20 MG PO TABS
40.0000 mg | ORAL_TABLET | Freq: Every day | ORAL | Status: DC
  Administered 2016-06-14 – 2016-06-15 (×2): 40 mg via ORAL
  Filled 2016-06-13 (×2): qty 2

## 2016-06-13 MED ORDER — ONDANSETRON HCL 4 MG/2ML IJ SOLN: 4.0000 mg | Freq: Four times a day (QID) | INTRAMUSCULAR | Status: DC | PRN

## 2016-06-13 MED ORDER — INSULIN ASPART 100 UNIT/ML ~~LOC~~ SOLN
3.0000 [IU] | Freq: Three times a day (TID) | SUBCUTANEOUS | Status: DC
  Administered 2016-06-13 – 2016-06-25 (×27): 3 [IU] via SUBCUTANEOUS

## 2016-06-13 MED ORDER — SODIUM CHLORIDE 0.9% FLUSH
3.0000 mL | INTRAVENOUS | Status: DC | PRN
  Administered 2016-06-27: 3 mL via INTRAVENOUS
  Filled 2016-06-13: qty 3

## 2016-06-13 MED ORDER — SODIUM CHLORIDE 0.9 % IV SOLN: 250.0000 mL | INTRAVENOUS | Status: DC | PRN

## 2016-06-13 MED ORDER — INSULIN ASPART 100 UNIT/ML ~~LOC~~ SOLN
3.0000 [IU] | Freq: Once | SUBCUTANEOUS | Status: AC
  Administered 2016-06-13: 3 [IU] via SUBCUTANEOUS

## 2016-06-13 MED ORDER — FUROSEMIDE 10 MG/ML IJ SOLN
120.0000 mg | Freq: Two times a day (BID) | INTRAVENOUS | Status: DC
  Administered 2016-06-13: 120 mg via INTRAVENOUS
  Filled 2016-06-13 (×2): qty 12

## 2016-06-13 MED ORDER — INSULIN ASPART 100 UNIT/ML ~~LOC~~ SOLN
0.0000 [IU] | Freq: Three times a day (TID) | SUBCUTANEOUS | Status: DC
  Administered 2016-06-13: 5 [IU] via SUBCUTANEOUS
  Administered 2016-06-14: 3 [IU] via SUBCUTANEOUS
  Administered 2016-06-14: 5 [IU] via SUBCUTANEOUS
  Administered 2016-06-14: 3 [IU] via SUBCUTANEOUS
  Administered 2016-06-15: 5 [IU] via SUBCUTANEOUS
  Administered 2016-06-15: 8 [IU] via SUBCUTANEOUS
  Administered 2016-06-15 – 2016-06-16 (×2): 11 [IU] via SUBCUTANEOUS

## 2016-06-13 MED ORDER — ACETAMINOPHEN 325 MG PO TABS
650.0000 mg | ORAL_TABLET | ORAL | Status: DC | PRN
  Administered 2016-06-20 – 2016-06-22 (×4): 650 mg via ORAL
  Filled 2016-06-13 (×5): qty 2

## 2016-06-13 MED ORDER — HEPARIN SODIUM (PORCINE) 5000 UNIT/ML IJ SOLN: 5000.0000 [IU] | Freq: Three times a day (TID) | INTRAMUSCULAR | Status: DC

## 2016-06-13 MED ORDER — HEPARIN (PORCINE) IN NACL 2-0.9 UNIT/ML-% IJ SOLN
INTRAMUSCULAR | Status: AC
  Filled 2016-06-13: qty 500

## 2016-06-13 MED ORDER — MILRINONE LACTATE IN DEXTROSE 20-5 MG/100ML-% IV SOLN
0.1250 ug/kg/min | INTRAVENOUS | Status: DC
  Administered 2016-06-13 – 2016-06-19 (×11): 0.25 ug/kg/min via INTRAVENOUS
  Administered 2016-06-20 – 2016-06-23 (×3): 0.125 ug/kg/min via INTRAVENOUS
  Filled 2016-06-13 (×14): qty 100

## 2016-06-13 MED ORDER — SODIUM CHLORIDE 0.9% FLUSH
3.0000 mL | Freq: Two times a day (BID) | INTRAVENOUS | Status: DC
  Administered 2016-06-13 – 2016-06-14 (×2): 3 mL via INTRAVENOUS

## 2016-06-13 MED ORDER — SODIUM CHLORIDE 0.9% FLUSH
3.0000 mL | Freq: Two times a day (BID) | INTRAVENOUS | Status: DC
  Administered 2016-06-13 – 2016-06-27 (×22): 3 mL via INTRAVENOUS

## 2016-06-13 MED ORDER — MIDAZOLAM HCL 2 MG/2ML IJ SOLN
INTRAMUSCULAR | Status: DC | PRN
  Administered 2016-06-13: 1 mg via INTRAVENOUS

## 2016-06-13 SURGICAL SUPPLY — 8 items
CATH SWAN GANZ 7F STRAIGHT (CATHETERS) ×2 IMPLANT
KIT HEART RIGHT NAMIC (KITS) ×2 IMPLANT
PACK CARDIAC CATHETERIZATION (CUSTOM PROCEDURE TRAY) ×2 IMPLANT
PROTECTION STATION PRESSURIZED (MISCELLANEOUS) ×2
SHEATH FAST CATH BRACH 5F 5CM (SHEATH) IMPLANT
SHEATH PINNACLE 7F 10CM (SHEATH) ×2 IMPLANT
STATION PROTECTION PRESSURIZED (MISCELLANEOUS) ×1 IMPLANT
TRANSDUCER W/STOPCOCK (MISCELLANEOUS) ×2 IMPLANT

## 2016-06-13 NOTE — Progress Notes (Addendum)
PROGRESS NOTE    Cassandra Bonilla  [id number] DOB: [birth year] DOA: 06/11/2016 PCP: Marco Collie, MD      Brief Narrative:  Cassandra Bonilla is a 75 y.o. female with medical history significant of CAD, COPD, CAD, DM, gout, HTN, presenting with 5 day history of worsening cough wheezing and shortness of breath. Patient reports one day ago saying her cardiologist to switch her from furosemide to torsemide due to concern for volume overload. No increased diuresis after changing to torsemide. Associated with generalized weakness and increased respiratory effort. Cough is occasionally productive. No home inhalers. Patient was admitted for respiratory distress due to COPD exacerbation and CHF exacerbation.   Assessment & Plan:   Principal Problem:   Acute on chronic combined systolic and diastolic heart failure (HCC) Active Problems:   Chronic atrial fibrillation (HCC)   Type 2 diabetes mellitus with stage 4 chronic kidney disease, with long-term current use of insulin (HCC)   CKD (chronic kidney disease), stage IV (HCC)   Encephalopathy, hepatic (HCC)   Cryptogenic cirrhosis of liver (HCC)   COPD exacerbation (HCC)   Hyperkalemia   Respiratory distress   Gout   GERD (gastroesophageal reflux disease)   Essential hypertension   Acute on chronic combined systolic and diastolic CHF exacerbation and pHTN  -BNP on admission 4262  -EF on 04/21/16: 30-35%, echo showed global hypokinesis with akinesis of inferior and inf-lat wall  -EF on 05/26/16: 40-45%  -Admitted for heart failure in August, discharged on lasix 120mg  BID, then switched to torsemide as outpatient   -Daily weights, Strict I/Os  -S/p RHC 9/28: "Severely elevated biventricular pressures with moderate pulmonary venous HTN and evidence of right heart failure"  -PICC line and IV milrinone per cardiology  -Lasix 120mg  IV BID  -Appreciate cardiology, heart failure team  COPD exacerbation -Levaquin for total 5 doses  -Duonebs  -Will switch  solumderol to oral prednisone. Tolerated okay also there is "confusion" listed as allergy   Elevated troponin -Likely secondary to CHF exacerbation as well as decreased renal function -No chest pain  -No ST changes on admission EKG    Chronic atrial fibrillation  -Recently admitted for GI bleed, coumadin held  -CHA2DS2-VASc 6 -Continue metoprolol   DM type 2, well controlled, with complication including CKD stage 4-5  -9/8 hemoglobin A1c= 7.4 -Continue lantus 5 units QAM -Appreciate diabetic coordinator. Increased SSI to moderate, hs coverage, and 3u with meals TID   Hyperkalemia -Lasix as above -Monitor   CKD stage 4-5  -Cr fluctuates between 2.5-3.5 at baseline  -Monitor BMP  -Has follow up [facility]  Hepatic encephalopathy and cryptogenic cirrhosis: previously admitted with hepatic encephalopathy  -Continue Lactulose, Xifaxan -Follows [facility]   Gout -Continue allopurinol  HTN -Continue metoprolol   GERD: -Continue protonix   DVT prophylaxis: subq hep  Code Status: Full Family Communication: husband at bedside Disposition Plan: Continue diuresis and management of this patient's right heart failure and pulm HTN.   Consultants:   Cardiology   Procedures:   None  Antimicrobials:  Levaquin 9/26 >>    Subjective: Patient groggy after procedure today. No complaints. She remains oriented to self only. She denies any chest pain, shortness of breath. She is hungry.   Objective: Vitals:   06/13/16 1140 06/13/16 1150 06/13/16 1155 06/13/16 1350  BP: 138/75  (!) 150/86 131/63  Pulse: 72 73 72 84  Resp: 13 13 12 18   Temp:    97.5 F (36.4 C)  TempSrc:  Oral  SpO2: 99% 100% 100% 100%  Weight:    73.1 kg (161 lb 2.5 oz)  Height:    5\' 2"  (1.575 m)    Intake/Output Summary (Last 24 hours) at 06/13/16 1538 Last data filed at 06/13/16 0820  Gross per 24 hour  Intake             1135 ml  Output                 0 ml  Net             1135 ml   Filed Weights   06/12/16 0436 06/13/16 0700 06/13/16 1350  Weight: 73.9 kg (162 lb 14.4 oz) 75.8 kg (167 lb 1.6 oz) 73.1 kg (161 lb 2.5 oz)    Examination:  General exam: Appears calm and comfortable  Respiratory system: Decreased inspiratory effort. No audible wheezes today. Respiratory effort normal. Cardiovascular system: S1 & S2 heard, irregular rhythm. +1 pedal edema. Gastrointestinal system: Abdomen is nondistended, soft and nontender. No organomegaly or masses felt. Normal bowel sounds heard. Central nervous system: Alert and oriented to self only.  Extremities: Symmetric Skin: No rashes, lesions or ulcers Psychiatry: Judgement and insight appear poor  Data Reviewed: I have personally reviewed following labs and imaging studies  CBC:  Recent Labs Lab 06/10/16 1305 06/11/16 1148 06/11/16 1805 06/12/16 0341 06/13/16 0708  WBC 6.4 5.9 5.7 3.4* 3.1*  NEUTROABS 4,480  --   --   --  2.7  HGB 9.5* 9.6* 9.5* 9.5* 8.9*  HCT 32.3* 33.3* 32.6* 32.7* 30.0*  MCV 86.8 86.9 87.4 86.5 84.7  PLT 244 271 264 274 [id number]   Basic Metabolic Panel:  Recent Labs Lab 06/10/16 1305 06/11/16 1148 06/11/16 1247 06/11/16 1805 06/12/16 0341 06/13/16 0708  NA 134* 136  --   --  136 132*  K 5.8* 5.6* 5.9*  --  5.7* 5.3*  CL 103 106  --   --  104 103  CO2 19* 18*  --   --  16* 16*  GLUCOSE 64* 106*  --   --  322* 267*  BUN 80* 83*  --   --  84* 95*  CREATININE 3.31* 3.41*  --  3.59* 3.65* 3.53*  CALCIUM 8.5* 8.2*  --   --  8.3* 8.0*  MG  --   --   --  1.9  --   --   PHOS  --   --   --  5.5*  --   --    GFR: Estimated Creatinine Clearance: 12.9 mL/min (by C-G formula based on SCr of 3.53 mg/dL (H)). Liver Function Tests:  Recent Labs Lab 06/11/16 1148 06/12/16 0341  AST 26 26  ALT 18 18  ALKPHOS 178* 184*  BILITOT 1.0 1.4*  PROT 7.0 7.0  ALBUMIN 2.8* 2.8*   No results for input(s): LIPASE, AMYLASE in the last 168 hours.  Recent  Labs Lab 06/11/16 1821  AMMONIA 64*   Coagulation Profile:  Recent Labs Lab 06/11/16 1148  INR 1.51   Cardiac Enzymes:  Recent Labs Lab 06/11/16 1805  TROPONINI 0.05*   BNP (last 3 results) No results for input(s): PROBNP in the last 8760 hours. HbA1C: No results for input(s): HGBA1C in the last 72 hours. CBG:  Recent Labs Lab 06/12/16 1651 06/12/16 2104 06/13/16 0642 06/13/16 1034 06/13/16 1413  GLUCAP 384* 306* 261* 226* 227*   Lipid Profile: No results for input(s): CHOL, HDL, LDLCALC, TRIG, CHOLHDL, LDLDIRECT in  the last 72 hours. Thyroid Function Tests: No results for input(s): TSH, T4TOTAL, FREET4, T3FREE, THYROIDAB in the last 72 hours. Anemia Panel: No results for input(s): VITAMINB12, FOLATE, FERRITIN, TIBC, IRON, RETICCTPCT in the last 72 hours. Sepsis Labs:  Recent Labs Lab 06/11/16 1805 06/13/16 0708  PROCALCITON 0.43 0.34    Recent Results (from the past 240 hour(s))  MRSA PCR Screening     Status: None   Collection Time: 06/13/16  1:50 PM  Result Value Ref Range Status   MRSA by PCR NEGATIVE NEGATIVE Final    Comment:        The GeneXpert MRSA Assay (FDA approved for NASAL specimens only), is one component of a comprehensive MRSA colonization surveillance program. It is not intended to diagnose MRSA infection nor to guide or monitor treatment for MRSA infections.          Radiology Studies: No results found.      Scheduled Meds: . allopurinol  100 mg Oral Daily  . cholestyramine light  4 g Oral Q12H  . furosemide  120 mg Intravenous BID  . guaiFENesin  1,200 mg Oral BID  . heparin  5,000 Units Subcutaneous Q8H  . insulin aspart  0-5 Units Subcutaneous QHS  . insulin aspart  0-9 Units Subcutaneous TID WC  . insulin glargine  5 Units Subcutaneous BH-q7a  . lactulose  30 g Oral TID  . levofloxacin  500 mg Oral Q48H  . methylPREDNISolone (SOLU-MEDROL) injection  60 mg Intravenous Q12H  . pantoprazole  40 mg Oral Daily   . rifaximin  400 mg Oral TID  . sodium chloride flush  10-40 mL Intracatheter Q12H  . sodium chloride flush  3 mL Intravenous Q12H  . sodium chloride flush  3 mL Intravenous Q12H  . sodium chloride flush  3 mL Intravenous Q12H   Continuous Infusions: . milrinone 0.25 mcg/kg/min (06/13/16 1150)     LOS: 2 days    Time spent: 30 minutes    Dessa Phi, DO [facility] Pager [phone]  If 7PM-7AM, please contact night-coverage [email or web address] Password [facility] 06/13/2016, 3:38 PM

## 2016-06-13 NOTE — Progress Notes (Signed)
Per IV consult, 20 G PIV attempted by IVT RN.  Upon arrival to perform 2nd assess, pt in hallway to be transported to cath lab.  Transport tech called cath lab to verify need.  Was told no PIV needed since one is already established and working.

## 2016-06-13 NOTE — Progress Notes (Signed)
PT Cancellation Note  Patient Details Name: Sarine Living MRN: [id number] DOB: 08/05/1941   Cancelled Treatment:    Reason Eval/Treat Not Completed: Medical issues which prohibited therapy (Pt in CATH lab.  Will not be able to evaluate until late pm or early tomorrow am.)Thanks.   Irwin Brakeman F 06/13/2016, 10:39 AM  [facility] [phone] (pager)

## 2016-06-13 NOTE — H&P (View-Only) (Signed)
Cardiology Consult    Patient ID: Cassandra Bonilla MRN: [id number], DOB/AGE: 1942-07-75   Admit date: 06/11/2016 Date of Consult: 06/12/2016  Primary Physician: Marco Collie, MD Primary Cardiologist: Dr. Oval Linsey Requesting Provider: Dr. Maylene Roes Reason for Consultation: CHF  Patient Profile    75 yo female with PMH of chronic systolic and diastolic heart failure (LVEF 40-45%), chronic atrial fibrillation COPD, carotid stenosis s/p CEA, CAD s/p CABG, diabetes mellitus, CKD 4, GI bleed, cryptogenic cirrhosis, and severe pulmonary hypertension who presented with worsening dyspnea.   Past Medical History   Past Medical History:  Diagnosis Date  . CHF (congestive heart failure) ([place])   . CKD (chronic kidney disease) stage 3, GFR 30-59 ml/min   . Colon polyps 05/28/2016   3 ascending, 3 transverse, 1 descending  . COPD (chronic obstructive pulmonary disease) ([place])   . Coronary artery disease   . Cryptogenic cirrhosis ([place])   . Diabetes mellitus without complication ([place])   . Gout   . Hypertension   . Hypokalemia 03/21/2016    Past Surgical History:  Procedure Laterality Date  . CAROTID ENDARTERECTOMY    . CESAREAN SECTION    . CHOLECYSTECTOMY    . COLONOSCOPY N/A 05/28/2016   Procedure: COLONOSCOPY;  Surgeon: Manus Gunning, MD;  Location: [facility] ENDOSCOPY;  Service: Gastroenterology;  Laterality: N/A;  . CORONARY ANGIOPLASTY    . CORONARY ARTERY BYPASS GRAFT    . ESOPHAGOGASTRODUODENOSCOPY N/A 05/26/2016   Procedure: ESOPHAGOGASTRODUODENOSCOPY (EGD);  Surgeon: Juanita Craver, MD;  Location: [facility] ENDOSCOPY;  Service: Endoscopy;  Laterality: N/A;  . GIVENS CAPSULE STUDY N/A 05/28/2016   Procedure: GIVENS CAPSULE STUDY;  Surgeon: Manus Gunning, MD;  Location: [place];  Service: Gastroenterology;  Laterality: N/A;     Allergies  Allergies  Allergen Reactions  . Contrast Media [Iodinated Diagnostic Agents] Hives  . Prednisone Other (See Comments)    Confusion  . Lactose  Intolerance (Gi) Diarrhea    There are times when/if the patient consumes milk products, it results in diarrhea    History of Present Illness    Cassandra Bonilla is a 75 yo with PMH of chronic systolic and diastolic heart failure (LVEF 40-45%), chronic atrial fibrillation COPD, carotid stenosis s/p CEA, CAD s/p CABG, diabetes mellitus, CKD 4, GI bleed, cryptogenic cirrhosis, and severe pulmonary hypertension. She is followed in the office by Dr. Oval Linsey. Appears that she was admitted to [facility] 9/8-9/19 with GI bleed.  INR was 3.7 on admission. This was reversed with vitamin K and FFP. She underwent colonoscopy and was found to have diverticulosis. Several polyps were removed, but there was no clear etiology of the bleeding.  She also had a capsule endoscopy that did not reveal a bleeding source. Warfarin was held throughout hospitalization and was not restarted at discharge. Appears she had follow up with GI regarding restarting her anticoagulation.   She was seen in the office on 9/25 where she reported audible wheezing and a cough over the past 4-5 days. The decision was made to which her lasix to torsemide 60mg  BID, but noted that her weight was stable. Felt at that time that she may be loosing muscle and gaining fluid. BNP was checked and noted at 4262, up from 3143 back in august. Her weight in the office was 153lbs.   Last 2D echo showed EF of 40-45% with mild to moderate MR, moderately dilated left atrium, and severe TR.   She presented to the GI office on 9/26 and noted that she  appeared fluid overloaded, suggested that she come to the ED for further evaluation.   In the ED her labs showed K+ 5.6, Cr 3.41 (baseline appears between 2.5~3.5), albumin 2.8, Hgb 9.6, Trop 0.05. Chest x-ray showed mild interstitial pulmonary edema. EKG showed accelerated junctional rhythm. She was admitted to Internal Medicine for further management and started on IV lasix 60mg  BID.   Inpatient Medications      . allopurinol  100 mg Oral Daily  . cholestyramine light  4 g Oral Q12H  . furosemide  60 mg Intravenous BID  . guaiFENesin  1,200 mg Oral BID  . heparin  5,000 Units Subcutaneous Q8H  . insulin aspart  0-5 Units Subcutaneous QHS  . insulin aspart  0-9 Units Subcutaneous TID WC  . insulin glargine  5 Units Subcutaneous BH-q7a  . lactulose  30 g Oral TID  . [START ON 06/13/2016] levofloxacin  500 mg Oral Q48H  . methylPREDNISolone (SOLU-MEDROL) injection  60 mg Intravenous Q12H  . metoprolol succinate  25 mg Oral Daily  . pantoprazole  40 mg Oral Daily  . rifaximin  400 mg Oral TID  . sodium chloride flush  3 mL Intravenous Q12H    Family History    Family History  Problem Relation Age of Onset  . Breast cancer Mother     Social History    Social History   Social History  . Marital status: Married    Spouse name: N/A  . Number of children: N/A  . Years of education: N/A   Occupational History  . Not on file.   Social History Main Topics  . Smoking status: Former Research scientist (life sciences)  . Smokeless tobacco: Never Used     Comment: quit smoking  in 1993  . Alcohol use No  . Drug use: No  . Sexual activity: Not on file   Other Topics Concern  . Not on file   Social History Narrative  . No narrative on file     Review of Systems    General:  No chills, fever, night sweats or weight changes.  Cardiovascular:  See HPI Dermatological: No rash, lesions/masses Respiratory: No cough, dyspnea Urologic: No hematuria, dysuria Abdominal:   No nausea, vomiting, diarrhea, bright red blood per rectum, melena, or hematemesis Neurologic:  No visual changes, wkns, changes in mental status. All other systems reviewed and are otherwise negative except as noted above.  Physical Exam    Blood pressure 137/79, pulse 82, temperature 98.4 F (36.9 C), temperature source Oral, resp. rate 18, height 5\' 2"  (1.575 m), weight 162 lb 14.4 oz (73.9 kg), SpO2 100 %.  General: Pleasant older AA  female, NAD Psych: Normal affect. Neuro: Alert and oriented X 3. Moves all extremities spontaneously. HEENT: Normal  Neck: Supple without bruits or JVD. Lungs:  Resp regular and unlabored, Mild expiratory wheezing. Faint crackles bilaterally. Heart: Irregularly irregular no s3, s4, 4/6 systolic murmur. Abdomen: Soft, non-tender, non-distended, BS + x 4.  Extremities: No clubbing, cyanosis, 1+ lower extremity edema. DP/PT/Radials 2+ and equal bilaterally.  Labs    Troponin (Point of Care Test) No results for input(s): TROPIPOC in the last 72 hours.  Recent Labs  06/11/16 1805  TROPONINI 0.05*   Lab Results  Component Value Date   WBC 3.4 (L) 06/12/2016   HGB 9.5 (L) 06/12/2016   HCT 32.7 (L) 06/12/2016   MCV 86.5 06/12/2016   PLT 274 06/12/2016    Recent Labs Lab 06/12/16 0341  NA 136  K  5.7*  CL 104  CO2 16*  BUN 84*  CREATININE 3.65*  CALCIUM 8.3*  PROT 7.0  BILITOT 1.4*  ALKPHOS 184*  ALT 18  AST 26  GLUCOSE 322*   Lab Results  Component Value Date   CHOL 123 05/24/2016   HDL 38 (L) 05/24/2016   LDLCALC 59 05/24/2016   TRIG 129 05/24/2016   No results found for: [facility]   Radiology Studies    Dg Chest 2 View  Result Date: 06/11/2016 CLINICAL DATA:  Patient with wheezing and shortness of breath for 1 week. EXAM: CHEST  2 VIEW COMPARISON:  Chest radiograph 05/25/2016. FINDINGS: Stable cardiomegaly status post median sternotomy. Pulmonary vascular redistribution and bilateral perihilar interstitial opacities. Small bilateral pleural effusions. Thoracic spine degenerative changes. IMPRESSION: Cardiomegaly and mild interstitial pulmonary edema. Electronically Signed   By: Lovey Newcomer M.D.   On: 06/11/2016 11:25    ECG & Cardiac Imaging    EKG: Accelerated junctional rhythm  Echo: 05/26/16  Study Conclusions  - Left ventricle: The cavity size was normal. Wall thickness was   increased in a pattern of mild LVH. Systolic function was mildly   to  moderately reduced. The estimated ejection fraction was in the   range of 40% to 45%. - Ventricular septum: The contour showed diastolic flattening and   systolic flattening. - Aortic valve: Severely calcified annulus. - Mitral valve: There was mild to moderate regurgitation. - Left atrium: The atrium was moderately dilated. - Right ventricle: The cavity size was mildly dilated. Systolic   function was mildly to moderately reduced. - Right atrium: The atrium was mildly dilated. - Tricuspid valve: There was severe regurgitation. - Pulmonary arteries: Systolic pressure was severely increased. PA   peak pressure: 75 mm Hg (S).  Assessment & Plan    75 yo female with PMH of chronic systolic and diastolic heart failure (LVEF 40-45%), chronic atrial fibrillation COPD, carotid stenosis s/p CEA, CAD s/p CABG, diabetes mellitus, CKD 4, GI bleed, cryptogenic cirrhosis, and severe pulmonary hypertension who presented with worsening dyspnea.   1. Acute on Chronic combined CHF vs pulmonary HTN: Saw seen in the office on 9/25 and noted to have audible wheezing and cough. This was thought to be cardiac in nature and her lasix was changed to torsemide 60mg  BID. She denies any orthopnea or PND at home, even states that she has not been SOB, only wheezing. Last BNP was elevated from 3143>>4262. Her chest xray only showed mild interstitial edema, does not appear to be volume overloaded on exam. Currently on IV lasix 60mg  BID, but Cr and BUN with noted rise.  -- May need to hold lasix given elevation in Cr? BNP is elevated with compared to reading in august. On last 2D echo her PA pressure was noted at 75. Last weight noted in the office on 9/25 was 153lbs, and now noted at 162lbs. I question whether this is accurate? No documented outpt noted, but did have urine in [facility].  -- Discussed with Dr. Ellyn Hack, will place for RHC tomorrow to further assess pulmonary HTN.   2. COPD: Question whether she respiratory issues  are more so related to underlying COPD? Difficult to determine as she has been receiving both Nebs, and lasix.   3. CKD IV: Appears that her baseline is around 2.5.-3.5, current 3.6 this admission with IV lasix. Seems reasonable to consider nephrology consult regarding further management, given noted elevated K+ this admission as well.   4. Hx of encephalopathy and cryptogenic  cirrhosis: On lactulose, ammonia level 64 yesterday. Followed by [facility]  5. Chronic Atrial Fib: Recently admitted for GI bleed and her coumadin was held. Currently in atrial fib on telemetry but rates controlled.  -- Continue on metoprolol   Signed, Reino Bellis, NP-C Pager [phone] 06/12/2016, 2:46 PM

## 2016-06-13 NOTE — Progress Notes (Signed)
Patient Name: Cassandra Bonilla Date of Encounter: 06/13/2016  Primary Cardiologist: Dr. Sande Rives Problem List     Principal Problem:   Acute on chronic combined systolic and diastolic heart failure (HCC) Active Problems:   Chronic atrial fibrillation (HCC)   Type 2 diabetes mellitus with stage 4 chronic kidney disease, with long-term current use of insulin (HCC)   CKD (chronic kidney disease), stage IV (HCC)   Encephalopathy, hepatic (HCC)   Cryptogenic cirrhosis of liver (HCC)   COPD exacerbation (HCC)   Hyperkalemia   Respiratory distress   Gout   GERD (gastroesophageal reflux disease)   Essential hypertension     Subjective    Inpatient Medications    Scheduled Meds: . [MAR Hold] allopurinol  100 mg Oral Daily  . [MAR Hold] cholestyramine light  4 g Oral Q12H  . [MAR Hold] furosemide  60 mg Intravenous BID  . [MAR Hold] guaiFENesin  1,200 mg Oral BID  . [MAR Hold] heparin  5,000 Units Subcutaneous Q8H  . [MAR Hold] insulin aspart  0-5 Units Subcutaneous QHS  . [MAR Hold] insulin aspart  0-9 Units Subcutaneous TID WC  . [MAR Hold] insulin glargine  5 Units Subcutaneous BH-q7a  . [MAR Hold] lactulose  30 g Oral TID  . [MAR Hold] levofloxacin  500 mg Oral Q48H  . [MAR Hold] methylPREDNISolone (SOLU-MEDROL) injection  60 mg Intravenous Q12H  . [MAR Hold] metoprolol succinate  25 mg Oral Daily  . [MAR Hold] pantoprazole  40 mg Oral Daily  . [MAR Hold] rifaximin  400 mg Oral TID  . [MAR Hold] sodium chloride flush  3 mL Intravenous Q12H  . sodium chloride flush  3 mL Intravenous Q12H   Continuous Infusions:  PRN Meds:.[MAR Hold] sodium chloride, sodium chloride, [MAR Hold] albuterol, [MAR Hold] hydrOXYzine, [MAR Hold] ipratropium-albuterol, [MAR Hold] ondansetron **OR** [MAR Hold] ondansetron (ZOFRAN) IV, [MAR Hold] sodium chloride flush, sodium chloride flush   Vital Signs    Vitals:   06/12/16 1230 06/12/16 1832 06/12/16 2108 06/13/16 0700  BP: 137/79   (!) 133/45   Pulse: 82  75   Resp: 18  20   Temp: 98.4 F (36.9 C)  98.5 F (36.9 C)   TempSrc: Oral  Oral   SpO2: 100% 95% 100%   Weight:    167 lb 1.6 oz (75.8 kg)  Height:        Intake/Output Summary (Last 24 hours) at 06/13/16 0859 Last data filed at 06/13/16 0820  Gross per 24 hour  Intake             1790 ml  Output                0 ml  Net             1790 ml   Filed Weights   06/11/16 1433 06/12/16 0436 06/13/16 0700  Weight: 162 lb 14.7 oz (73.9 kg) 162 lb 14.4 oz (73.9 kg) 167 lb 1.6 oz (75.8 kg)    Physical Exam   GEN: Well nourished, well developed, in no acute distress.  HEENT: Grossly normal.  Neck: Supple, no JVD, carotid bruits, or masses. Cardiac: RRR, no murmurs, rubs, or gallops. No clubbing, cyanosis, edema.  Radials/DP/PT 2+ and equal bilaterally.  Respiratory:  Respirations regular and unlabored, clear to auscultation bilaterally. GI: Soft, nontender, nondistended, BS + x 4. MS: no deformity or atrophy. Skin: warm and dry, no rash. Neuro:  Strength and sensation are intact. Psych: AAOx3.  Normal affect.  Labs    CBC  Recent Labs  06/10/16 1305  06/12/16 0341 06/13/16 0708  WBC 6.4  < > 3.4* 3.1*  NEUTROABS 4,480  --   --  2.7  HGB 9.5*  < > 9.5* 8.9*  HCT 32.3*  < > 32.7* 30.0*  MCV 86.8  < > 86.5 84.7  PLT 244  < > 274 242  < > = values in this interval not displayed. Basic Metabolic Panel  Recent Labs  06/11/16 1805 06/12/16 0341 06/13/16 0708  NA  --  136 132*  K  --  5.7* 5.3*  CL  --  104 103  CO2  --  16* 16*  GLUCOSE  --  322* 267*  BUN  --  84* 95*  CREATININE 3.59* 3.65* 3.53*  CALCIUM  --  8.3* 8.0*  MG 1.9  --   --   PHOS 5.5*  --   --    Liver Function Tests  Recent Labs  06/11/16 1148 06/12/16 0341  AST 26 26  ALT 18 18  ALKPHOS 178* 184*  BILITOT 1.0 1.4*  PROT 7.0 7.0  ALBUMIN 2.8* 2.8*   Cardiac Enzymes  Recent Labs  06/11/16 1805  TROPONINI 0.05*    Telemetry     atrial fibrillation-  Personally Reviewed  ECG   Accelerated junctional  - Personally Reviewed  Radiology    Dg Chest 2 View  Result Date: 06/11/2016 CLINICAL DATA:  Patient with wheezing and shortness of breath for 1 week. EXAM: CHEST  2 VIEW COMPARISON:  Chest radiograph 05/25/2016. FINDINGS: Stable cardiomegaly status post median sternotomy. Pulmonary vascular redistribution and bilateral perihilar interstitial opacities. Small bilateral pleural effusions. Thoracic spine degenerative changes. IMPRESSION: Cardiomegaly and mild interstitial pulmonary edema. Electronically Signed   By: Lovey Newcomer M.D.   On: 06/11/2016 11:25     Cardiac Studies  RHC pending   Transthoracic Echocardiography 05/26/16 Study Conclusions  - Left ventricle: The cavity size was normal. Wall thickness was   increased in a pattern of mild LVH. Systolic function was mildly   to moderately reduced. The estimated ejection fraction was in the   range of 40% to 45%. - Ventricular septum: The contour showed diastolic flattening and   systolic flattening. - Aortic valve: Severely calcified annulus. - Mitral valve: There was mild to moderate regurgitation. - Left atrium: The atrium was moderately dilated. - Right ventricle: The cavity size was mildly dilated. Systolic   function was mildly to moderately reduced. - Right atrium: The atrium was mildly dilated. - Tricuspid valve: There was severe regurgitation. - Pulmonary arteries: Systolic pressure was severely increased. PA   peak pressure: 75 mm Hg (S).  Patient Profile  75 yo female with PMH of chronic systolic and diastolic heart failure (LVEF 40-45%), chronic atrial fibrillation COPD, carotid stenosis s/p CEA, CAD s/p CABG, diabetes mellitus, CKD 4, GI bleed, cryptogenic cirrhosis, and severe pulmonary hypertension who presented with worsening dyspnea.      Assessment & Plan    75 yo female with PMH of chronic systolic and diastolic heart failure (LVEF 40-45%), chronic atrial  fibrillation COPD, carotid stenosis s/p CEA, CAD s/p CABG, diabetes mellitus, CKD 4, GI bleed, cryptogenic cirrhosis, and severe pulmonary hypertension who presented with worsening dyspnea.   1. Acute on Chronic combined CHF vs pulmonary HTN: Saw seen in the office on 9/25 and noted to have audible wheezing and cough. This was thought to be cardiac in nature and her lasix  was changed to torsemide 60mg  BID. She denies any orthopnea or PND at home, even states that she has not been SOB, only wheezing. Last BNP was elevated from 3143>>4262. Her chest xray only showed mild interstitial edema, does not appear to be volume overloaded on exam. Currently on IV lasix 60mg  BID  -For RHC today. Patient is admitted for what does appear to be some volume overload from was most likely consistent with pulmonary hypertension related right heart failure. Has diastolic heart failure, but with no PND or orthopnea type symptoms this is probably more related to pulmonary hypertension.   2. COPD: management per primary team  3. Acute on CKD IV: Appears that her baseline is around 2.5.-3.5, current 3.6 this admission with IV lasix. Seems reasonable to consider nephrology consult regarding further management, given noted elevated K+ this admission as well.   4. Hx of encephalopathy and cryptogenic cirrhosis: On lactulose, ammonia level 64 yesterday. Followed by [facility]  5. Chronic Atrial Fib: Recently admitted for GI bleed and her coumadin was held. Currently in atrial fib on telemetry but rates controlled.  -- Continue on metoprolol   Signed, Arbutus Leas, NP  06/13/2016, 8:59 AM   Patient seen and examined with [name], NP. We discussed all aspects of the encounter. I agree with the assessment and plan as stated above.   Chronically ill woman with severe COPD and mixed systolic/diastolic HF and pulmonary HTN. Underwent RHC which showed severely elevated biventricular pressures with low cardiac output and RV  strain. Moved to SDU. PICC placed and milrinone started. Increase lasix. Will need to watch renal function very closely.   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 Will see if we can turn her around a bit with inotropic support. D/w patient and family at length.   Bensimhon, Daniel,MD 5:39 PM

## 2016-06-13 NOTE — Interval H&P Note (Signed)
History and Physical Interval Note:  06/13/2016 9:10 AM  Cassandra Bonilla  has presented today for surgery, with the diagnosis of pulmonary hypertension  The various methods of treatment have been discussed with the patient and family. After consideration of risks, benefits and other options for treatment, the patient has consented to  Procedure(s): Right Heart Cath (N/A) as a surgical intervention .  The patient's history has been reviewed, patient examined, no change in status, stable for surgery.  I have reviewed the patient's chart and labs.  Questions were answered to the patient's satisfaction.     Everson Mott, Quillian Quince

## 2016-06-13 NOTE — Progress Notes (Signed)
Peripherally Inserted Central Catheter/Midline Placement  The IV Nurse has discussed with the patient and/or persons authorized to consent for the patient, the purpose of this procedure and the potential benefits and risks involved with this procedure.  The benefits include less needle sticks, lab draws from the catheter, and the patient may be discharged home with the catheter. Risks include, but not limited to, infection, bleeding, blood clot (thrombus formation), and puncture of an artery; nerve damage and irregular heartbeat and possibility to perform a PICC exchange if needed/ordered by physician.  Alternatives to this procedure were also discussed.  Bard Power PICC patient education guide, fact sheet on infection prevention and patient information card has been provided to patient /or left at bedside.  Consent signed by husband due to patient being very drowsy post cardiac cath procedure.   PICC/Midline Placement Documentation  PICC Double Lumen [id number] PICC Right Basilic 40 cm 0 cm (Active)  Indication for Insertion or Continuance of Line Poor Vasculature-patient has had multiple peripheral attempts or PIVs lasting less than 24 hours;Vasoactive infusions;Prolonged intravenous therapies 06/13/2016  3:33 PM  Exposed Catheter (cm) 0 cm 06/13/2016  3:33 PM  Dressing Change Due 06/13/16 06/13/2016  3:33 PM       Marlan Steward, Nicolette Bang 06/13/2016, 3:34 PM

## 2016-06-13 NOTE — Progress Notes (Signed)
Inpatient Diabetes Program Recommendations  AACE/ADA: New Consensus Statement on Inpatient Glycemic Control (2015)  Target Ranges:  Prepandial:   less than 140 mg/dL      Peak postprandial:   less than 180 mg/dL (1-2 hours)      Critically ill patients:  140 - 180 mg/dL   Lab Results  Component Value Date   GLUCAP 227 (H) 06/13/2016   HGBA1C 7.4 (H) 05/24/2016    Review of Glycemic Control Results for Cassandra Bonilla, Cassandra Bonilla (MRN [id number]) as of 06/13/2016 15:16  Ref. Range 06/12/2016 16:51 06/12/2016 21:04 06/13/2016 06:42 06/13/2016 10:34 06/13/2016 14:13  Glucose-Capillary Latest Ref Range: 65 - 99 mg/dL 384 (H) 306 (H) 261 (H) 226 (H) 227 (H)   Diabetes history: DM2 Outpatient Diabetes medications: Lantus 5 units QHS, Humalog 2-15 units tidwc Current orders for Inpatient glycemic control: Lantus 5 units QAM, Novolog 0-9 units tidwc.  Inpatient Diabetes Program Recommendations:   Noted CBGs 226-384. Please consider increase in Novolog correction scale to moderate 0-15 units tid with meals and 0-5 units hs while on steroids and add meal coverage Novolog 3 units tid if eats 50%.  Thank you, Nani Gasser. Kimorah Ridolfi, RN, MSN, CDE Inpatient Glycemic Control Team Team Pager [phone] (8am-5pm) 06/13/2016 3:18 PM

## 2016-06-14 DIAGNOSIS — N179 Acute kidney failure, unspecified: Secondary | ICD-10-CM

## 2016-06-14 LAB — BASIC METABOLIC PANEL
ANION GAP: 10 (ref 5–15)
BUN: 95 mg/dL — ABNORMAL HIGH (ref 6–20)
CALCIUM: 8.1 mg/dL — AB (ref 8.9–10.3)
CHLORIDE: 105 mmol/L (ref 101–111)
CO2: 20 mmol/L — AB (ref 22–32)
CREATININE: 3.48 mg/dL — AB (ref 0.44–1.00)
GFR, EST AFRICAN AMERICAN: 14 mL/min — AB (ref >60)
GFR, EST NON AFRICAN AMERICAN: 12 mL/min — AB (ref >60)
Glucose, Bld: 182 mg/dL — ABNORMAL HIGH (ref 65–99)
POTASSIUM: 4.2 mmol/L (ref 3.5–5.1)
Sodium: 135 mmol/L (ref 135–145)

## 2016-06-14 LAB — GLUCOSE, CAPILLARY
GLUCOSE-CAPILLARY: 181 mg/dL — AB (ref 65–99)
GLUCOSE-CAPILLARY: 215 mg/dL — AB (ref 65–99)
GLUCOSE-CAPILLARY: 219 mg/dL — AB (ref 65–99)
Glucose-Capillary: 183 mg/dL — ABNORMAL HIGH (ref 65–99)
Glucose-Capillary: 162 mg/dL — ABNORMAL HIGH (ref 65–99)

## 2016-06-14 LAB — CBC WITH DIFFERENTIAL/PLATELET
BASOS ABS: 0 10*3/uL (ref 0.0–0.1)
BASOS PCT: 0 %
EOS ABS: 0 10*3/uL (ref 0.0–0.7)
Eosinophils Relative: 0 %
HEMATOCRIT: 27.1 % — AB (ref 36.0–46.0)
HEMOGLOBIN: 8.2 g/dL — AB (ref 12.0–15.0)
Lymphocytes Relative: 5 %
Lymphs Abs: 0.2 10*3/uL — ABNORMAL LOW (ref 0.7–4.0)
MCH: 25.2 pg — ABNORMAL LOW (ref 26.0–34.0)
MCHC: 30.3 g/dL (ref 30.0–36.0)
MCV: 83.4 fL (ref 78.0–100.0)
MONOS PCT: 8 %
Monocytes Absolute: 0.3 10*3/uL (ref 0.1–1.0)
NEUTROS ABS: 3.6 10*3/uL (ref 1.7–7.7)
NEUTROS PCT: 87 %
Platelets: 214 10*3/uL (ref 150–400)
RBC: 3.25 MIL/uL — AB (ref 3.87–5.11)
RDW: 18 % — ABNORMAL HIGH (ref 11.5–15.5)
WBC: 4.1 10*3/uL (ref 4.0–10.5)

## 2016-06-14 LAB — CARBOXYHEMOGLOBIN
CARBOXYHEMOGLOBIN: 1.2 % (ref 0.5–1.5)
METHEMOGLOBIN: 0.8 % (ref 0.0–1.5)
O2 SAT: 60.5 %
TOTAL HEMOGLOBIN: 8.5 g/dL — AB (ref 12.0–16.0)

## 2016-06-14 LAB — AMMONIA: AMMONIA: 42 umol/L — AB (ref 9–35)

## 2016-06-14 MED ORDER — IPRATROPIUM-ALBUTEROL 0.5-2.5 (3) MG/3ML IN SOLN
3.0000 mL | Freq: Four times a day (QID) | RESPIRATORY_TRACT | Status: DC
  Administered 2016-06-14 – 2016-06-15 (×5): 3 mL via RESPIRATORY_TRACT
  Filled 2016-06-14 (×6): qty 3

## 2016-06-14 MED ORDER — METOLAZONE 2.5 MG PO TABS
2.5000 mg | ORAL_TABLET | ORAL | Status: DC
  Administered 2016-06-14: 2.5 mg via ORAL
  Filled 2016-06-14: qty 1

## 2016-06-14 MED ORDER — METOLAZONE 5 MG PO TABS
5.0000 mg | ORAL_TABLET | Freq: Two times a day (BID) | ORAL | Status: DC
  Administered 2016-06-14 – 2016-06-23 (×18): 5 mg via ORAL
  Filled 2016-06-14 (×18): qty 1

## 2016-06-14 MED ORDER — FUROSEMIDE 10 MG/ML IJ SOLN
120.0000 mg | Freq: Three times a day (TID) | INTRAVENOUS | Status: DC
  Administered 2016-06-14 – 2016-06-17 (×10): 120 mg via INTRAVENOUS
  Filled 2016-06-14 (×11): qty 12

## 2016-06-14 MED FILL — Heparin Sodium (Porcine) 2 Unit/ML in Sodium Chloride 0.9%: INTRAMUSCULAR | Qty: 500 | Status: AC

## 2016-06-14 MED FILL — Lidocaine HCl Local Preservative Free (PF) Inj 1%: INTRAMUSCULAR | Qty: 30 | Status: AC

## 2016-06-14 NOTE — Progress Notes (Signed)
Patient Name: Cassandra Bonilla Date of Encounter: 06/14/2016  Primary Cardiologist: Dr. Sande Rives Problem List     Principal Problem:   Acute on chronic combined systolic and diastolic heart failure (HCC) Active Problems:   Chronic atrial fibrillation (HCC)   Type 2 diabetes mellitus with stage 4 chronic kidney disease, with long-term current use of insulin (HCC)   CKD (chronic kidney disease), stage IV (HCC)   Encephalopathy, hepatic (HCC)   Cryptogenic cirrhosis of liver (HCC)   COPD exacerbation (HCC)   Hyperkalemia   Respiratory distress   Gout   GERD (gastroesophageal reflux disease)   Essential hypertension    Subjective   Chronically ill woman with severe COPD and mixed systolic/diastolic HF and pulmonary HTN. Underwent RHC which showed severely elevated biventricular pressures with low cardiac output and RV strain. Moved to SDU. PICC placed and milrinone started. Increased lasix. Will need to watch renal function very closely.   Coox 60.5% this am on milrinone 0.25 mcg/kg/min. CVP 23-24 this am.   Breathing feels "wheezy".  Tired.    Out 300 cc. Weight shows up 6 lbs. With IV lasix 120 mg q8hrs.  Hasn't received metolazone yet.   [place] 06/13/16  RA = 30 RV = 68/26 PA = 69/25 (43) PCW = 33 Fick cardiac output/index = 3.7/2.1 Thermo CO/CI = 4.0/2.3 PVR = 2.5 WU Ao sat = 99% PA sat = 45%, 46%  Inpatient Medications    Scheduled Meds: . allopurinol  100 mg Oral Daily  . cholestyramine light  4 g Oral Q12H  . furosemide  120 mg Intravenous Q8H  . guaiFENesin  1,200 mg Oral BID  . heparin  5,000 Units Subcutaneous Q8H  . insulin aspart  0-15 Units Subcutaneous TID WC  . insulin aspart  0-5 Units Subcutaneous QHS  . insulin aspart  3 Units Subcutaneous TID WC  . insulin glargine  5 Units Subcutaneous BH-q7a  . lactulose  30 g Oral TID  . levofloxacin  500 mg Oral Q48H  . metolazone  2.5 mg Oral Q24H  . pantoprazole  40 mg Oral Daily  . predniSONE  40  mg Oral Q breakfast  . rifaximin  400 mg Oral TID  . sodium chloride flush  10-40 mL Intracatheter Q12H  . sodium chloride flush  3 mL Intravenous Q12H  . sodium chloride flush  3 mL Intravenous Q12H  . sodium chloride flush  3 mL Intravenous Q12H   Continuous Infusions: . milrinone 0.25 mcg/kg/min (06/14/16 0545)   PRN Meds:.sodium chloride, sodium chloride, sodium chloride, acetaminophen, albuterol, hydrOXYzine, ipratropium-albuterol, ondansetron **OR** ondansetron (ZOFRAN) IV, sodium chloride flush, sodium chloride flush, sodium chloride flush, sodium chloride flush   Vital Signs    Vitals:   06/13/16 2340 06/14/16 0000 06/14/16 0400 06/14/16 0500  BP: (!) 155/64 (!) 153/95 132/77 123/73  Pulse: 89 87 85 86  Resp: 18 16 (!) 5 19  Temp: 97.8 F (36.6 C)   97.6 F (36.4 C)  TempSrc: Oral   Oral  SpO2: 99% 99% 96% 99%  Weight:    167 lb 12.3 oz (76.1 kg)  Height:        Intake/Output Summary (Last 24 hours) at 06/14/16 0752 Last data filed at 06/14/16 0634  Gross per 24 hour  Intake           483.78 ml  Output             1300 ml  Net          -  816.22 ml   Filed Weights   06/13/16 0700 06/13/16 1350 06/14/16 0500  Weight: 167 lb 1.6 oz (75.8 kg) 161 lb 2.5 oz (73.1 kg) 167 lb 12.3 oz (76.1 kg)    Physical Exam   GEN: Elderly and chronically ill appearing.   HEENT: Normal Neck: Supple, JVD to jaw, carotid bruits, or masses. Cardiac: RRR, no murmurs, rubs, or gallops. No clubbing, cyanosis, edema.  Radials/DP/PT 2+ and equal bilaterally.  Respiratory:  Wheezing throughout.  GI: Soft, NT, ND, no HSM. No bruits or masses. +BS  MS: no deformity or atrophy. Skin: warm and dry, no rash. Neuro:  Strength and sensation are intact. Psych: Alert to person.  Flat affect.  Labs    CBC  Recent Labs  06/13/16 0708 06/14/16 0500  WBC 3.1* 4.1  NEUTROABS 2.7 3.6  HGB 8.9* 8.2*  HCT 30.0* 27.1*  MCV 84.7 83.4  PLT 242 161   Basic Metabolic Panel  Recent Labs   06/11/16 1805  06/13/16 0708 06/14/16 0500  NA  --   < > 132* 135  K  --   < > 5.3* 4.2  CL  --   < > 103 105  CO2  --   < > 16* 20*  GLUCOSE  --   < > 267* 182*  BUN  --   < > 95* 95*  CREATININE 3.59*  < > 3.53* 3.48*  CALCIUM  --   < > 8.0* 8.1*  MG 1.9  --   --   --   PHOS 5.5*  --   --   --   < > = values in this interval not displayed. Liver Function Tests  Recent Labs  06/11/16 1148 06/12/16 0341  AST 26 26  ALT 18 18  ALKPHOS 178* 184*  BILITOT 1.0 1.4*  PROT 7.0 7.0  ALBUMIN 2.8* 2.8*   Cardiac Enzymes  Recent Labs  06/11/16 1805  TROPONINI 0.05*    Telemetry     atrial fibrillation- Personally Reviewed  ECG   Accelerated junctional  - Personally Reviewed  Radiology    Dg Chest Port 1 View  Result Date: 06/13/2016 CLINICAL DATA:  Central catheter placement EXAM: PORTABLE CHEST 1 VIEW COMPARISON:  June 11, 2016 FINDINGS: Central catheter tip is in the superior vena cava. No pneumothorax. There is mild atelectasis in the left base with minimal left pleural effusion. Lungs elsewhere are clear. Heart is enlarged with pulmonary vascularity within normal limits. There is atherosclerotic calcification in the aorta. Patient is status post median sternotomy. There are surgical clips in the left cervical -thoracic junction. No evident adenopathy. No bone lesions. IMPRESSION: Central catheter tip in superior vena cava. No pneumothorax. Mild left base atelectasis with small left pleural effusion. Lungs elsewhere clear. Stable cardiomegaly. Aortic atherosclerosis. Electronically Signed   By: Lowella Grip III M.D.   On: 06/13/2016 15:51     Cardiac Studies  RHC pending   Transthoracic Echocardiography 05/26/16 Study Conclusions  - Left ventricle: The cavity size was normal. Wall thickness was   increased in a pattern of mild LVH. Systolic function was mildly   to moderately reduced. The estimated ejection fraction was in the   range of 40% to 45%. -  Ventricular septum: The contour showed diastolic flattening and   systolic flattening. - Aortic valve: Severely calcified annulus. - Mitral valve: There was mild to moderate regurgitation. - Left atrium: The atrium was moderately dilated. - Right ventricle: The cavity size was mildly  dilated. Systolic   function was mildly to moderately reduced. - Right atrium: The atrium was mildly dilated. - Tricuspid valve: There was severe regurgitation. - Pulmonary arteries: Systolic pressure was severely increased. PA   peak pressure: 75 mm Hg (S).  Patient Profile  75 yo female with PMH of chronic systolic and diastolic heart failure (LVEF 40-45%), chronic atrial fibrillation COPD, carotid stenosis s/p CEA, CAD s/p CABG, diabetes mellitus, CKD 4, GI bleed, cryptogenic cirrhosis, and severe pulmonary hypertension who presented with worsening dyspnea.      Assessment   1. Acute on chronic combined biventricular failure - Likely end stage 2. Pulmonary HTN 3. Severe COPD 4. AKI on CKD IV 5. Hx of Encephalopathy and cryptogenic cirrhosis 6. Chronic Afib.   Plan    PICC placed and started on milrinone with elevated filling pressures and marked volume overload.      Unimpressive urine output overnight on 120 mg IV lasix. She is scheduled to get first dose of  2.5 mg metolazone this am.  Will follow. Creatinine stable, though remains elevated. Baseline appears to be ~ 3.5.   On levaquin 5 doses for COPD exacerbation.  Have scheduled her nebs for today with diffuse expiratory wheezing.   Continue Ammonia for Hepatic Encephalopathy. Last ammonia 56 days old. Will recheck today.   Pt has poor prognosis overall. Met with palliative earlier this month (Admitted 9/8 -> 9/19 with GI Bleed).  Pt continued to request full code and "believes in miracles". Likely would benefit from further conversations with palliative care this admission.   Signed, Shirley Friar, PA-C  06/14/2016, 7:52 AM    Advanced Heart Failure Team Pager [phone] (M-F; 7a - 4p)  Please contact [facility] for night-coverage after hours (4p -7a ) and weekends on [email or web address]  Patient seen and examined with Oda Kilts, PA-C. We discussed all aspects of the encounter. I agree with the assessment and plan as stated above.   Urine output improved but CVP still very high. Co-ox much better on milrinone. Creatinine stable at 3.5. Will continue lasix 120 tid. Increase metolazone 5 bid. Being treated for COPD flare by primary team. Chronic AF stable.   Prognosis very tenuous. 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   Cassandra Stoffel,MD 11:34 AM

## 2016-06-14 NOTE — Progress Notes (Signed)
PROGRESS NOTE    Cassandra Bonilla  [id number] DOB: Jul 28, 1941 DOA: 06/11/2016 PCP: Marco Collie, MD      Brief Narrative:  Cassandra Bonilla is a 75 y.o. female with medical history significant of CAD, COPD, CAD, DM, gout, HTN, presenting with 5 day history of worsening cough wheezing and shortness of breath. Patient reports one day ago saying her cardiologist to switch her from furosemide to torsemide due to concern for volume overload. No increased diuresis after changing to torsemide. Associated with generalized weakness and increased respiratory effort. Cough is occasionally productive. No home inhalers. Patient was admitted for respiratory distress due to COPD exacerbation and CHF exacerbation.   Assessment & Plan:   Principal Problem:   Acute on chronic combined systolic and diastolic heart failure (HCC) Active Problems:   Chronic atrial fibrillation (HCC)   Type 2 diabetes mellitus with stage 4 chronic kidney disease, with long-term current use of insulin (HCC)   CKD (chronic kidney disease), stage IV (HCC)   Encephalopathy, hepatic (HCC)   Cryptogenic cirrhosis of liver (HCC)   COPD exacerbation (HCC)   Hyperkalemia   Respiratory distress   Gout   GERD (gastroesophageal reflux disease)   Essential hypertension   Acute on chronic combined systolic and diastolic CHF exacerbation and pHTN  -BNP on admission 4262  -EF on 04/21/16: 30-35%, echo showed global hypokinesis with akinesis of inferior and inf-lat wall  -EF on 05/26/16: 40-45%  -Admitted for heart failure in August, discharged on lasix 120mg  BID, then switched to torsemide as outpatient   -Daily weights, Strict I/Os  -S/p RHC 9/28: "Severely elevated biventricular pressures with moderate pulmonary venous HTN and evidence of right heart failure"  -PICC line and IV milrinone per cardiology  -Lasix increased to 120mg  TID with metolazone 5mg  BID  -Appreciate cardiology, heart failure team  COPD exacerbation -Levaquin for total  10 days to cover bronchitis  -Duonebs  -Prednisone for 5 days. Tolerated okay also there is "confusion" listed as allergy   Elevated troponin -Likely secondary to CHF exacerbation as well as decreased renal function -No chest pain   -No ST changes on admission EKG    Chronic atrial fibrillation  -Recently admitted for GI bleed, coumadin held  -CHA2DS2-VASc 6 -Continue metoprolol   DM type 2, well controlled, with complication including CKD stage 4-5  -9/8 hemoglobin A1c= 7.4 -Continue lantus 5 units QAM -Appreciate diabetic coordinator. Increased SSI to moderate, hs coverage, and 3u with meals TID   CKD stage 4-5  -Cr fluctuates between 2.5-3.5 at baseline  -Monitor BMP  -Has follow up [facility]  Hepatic encephalopathy and cryptogenic cirrhosis: previously admitted with hepatic encephalopathy  -Continue Lactulose, Xifaxan -Follows [facility]   Gout -Continue allopurinol  HTN -Continue metoprolol   GERD: -Continue protonix   DVT prophylaxis: subq hep  Code Status: Full Family Communication: no family at bedside  Disposition Plan: Continue diuresis and management of this patient's heart failure and pulm HTN. I did discuss the gravity of patient's situation with her today. She stated that she remains hopeful that she will improve. We may involve palliative care if no improvement.   Consultants:   Cardiology   Advanced heart failure   Procedures:   Right heart cath 9/28   Antimicrobials:  Levaquin 9/26 >> total 10 day course    Subjective: Patient denies any complaints today. She is less confused than previous and is able to tell me that she's at [facility] and that it is September.  She denies any chest pain, shortness of breath, nausea, vomiting. She did have a one time fever this morning; she is on levaquin.   Objective: Vitals:   06/14/16 1000 06/14/16 1149 06/14/16 1200 06/14/16 1400  BP: 140/64 (!)  130/44 (!) 128/59 110/61  Pulse: 88 89 83 92  Resp: 15 19 15 18   Temp:  97.3 F (36.3 C)    TempSrc:  Axillary    SpO2: 99% 97% 99% 100%  Weight:      Height:        Intake/Output Summary (Last 24 hours) at 06/14/16 1446 Last data filed at 06/14/16 1407  Gross per 24 hour  Intake           785.78 ml  Output             2125 ml  Net         -1339.22 ml   Filed Weights   06/13/16 0700 06/13/16 1350 06/14/16 0500  Weight: 75.8 kg (167 lb 1.6 oz) 73.1 kg (161 lb 2.5 oz) 76.1 kg (167 lb 12.3 oz)    Examination:  General exam: Appears calm and comfortable  Respiratory system: Decreased inspiratory effort. +diffuse expiratory rhonchi today. Respiratory effort normal. Cardiovascular system: S1 & S2 heard, irregular rhythm, tachycardic Gastrointestinal system: Abdomen is nondistended, soft and nontender. No organomegaly or masses felt. Normal bowel sounds heard. Central nervous system: Alert and oriented to self, place, time.  Extremities: Symmetric Skin: No rashes, lesions or ulcers Psychiatry: Judgement and insight appear poor  Data Reviewed: I have personally reviewed following labs and imaging studies  CBC:  Recent Labs Lab 06/10/16 1305 06/11/16 1148 06/11/16 1805 06/12/16 0341 06/13/16 0708 06/14/16 0500  WBC 6.4 5.9 5.7 3.4* 3.1* 4.1  NEUTROABS 4,480  --   --   --  2.7 3.6  HGB 9.5* 9.6* 9.5* 9.5* 8.9* 8.2*  HCT 32.3* 33.3* 32.6* 32.7* 30.0* 27.1*  MCV 86.8 86.9 87.4 86.5 84.7 83.4  PLT 244 271 264 274 242 [id number]   Basic Metabolic Panel:  Recent Labs Lab 06/10/16 1305 06/11/16 1148 06/11/16 1247 06/11/16 1805 06/12/16 0341 06/13/16 0708 06/14/16 0500  NA 134* 136  --   --  136 132* 135  K 5.8* 5.6* 5.9*  --  5.7* 5.3* 4.2  CL 103 106  --   --  104 103 105  CO2 19* 18*  --   --  16* 16* 20*  GLUCOSE 64* 106*  --   --  322* 267* 182*  BUN 80* 83*  --   --  84* 95* 95*  CREATININE 3.31* 3.41*  --  3.59* 3.65* 3.53* 3.48*  CALCIUM 8.5* 8.2*  --   --  8.3*  8.0* 8.1*  MG  --   --   --  1.9  --   --   --   PHOS  --   --   --  5.5*  --   --   --    GFR: Estimated Creatinine Clearance: 13.3 mL/min (by C-G formula based on SCr of 3.48 mg/dL (H)). Liver Function Tests:  Recent Labs Lab 06/11/16 1148 06/12/16 0341  AST 26 26  ALT 18 18  ALKPHOS 178* 184*  BILITOT 1.0 1.4*  PROT 7.0 7.0  ALBUMIN 2.8* 2.8*   No results for input(s): LIPASE, AMYLASE in the last 168 hours.  Recent Labs Lab 06/11/16 1821 06/14/16 1250  AMMONIA 64* 42*   Coagulation Profile:  Recent Labs Lab 06/11/16 1148  INR 1.51   Cardiac Enzymes:  Recent Labs Lab 06/11/16 1805  TROPONINI 0.05*   BNP (last 3 results) No results for input(s): PROBNP in the last 8760 hours. HbA1C: No results for input(s): HGBA1C in the last 72 hours. CBG:  Recent Labs Lab 06/13/16 1955 06/13/16 2215 06/14/16 0031 06/14/16 0828 06/14/16 1151  GLUCAP 214* 189* 215* 183* 219*   Lipid Profile: No results for input(s): CHOL, HDL, LDLCALC, TRIG, CHOLHDL, LDLDIRECT in the last 72 hours. Thyroid Function Tests: No results for input(s): TSH, T4TOTAL, FREET4, T3FREE, THYROIDAB in the last 72 hours. Anemia Panel: No results for input(s): VITAMINB12, FOLATE, FERRITIN, TIBC, IRON, RETICCTPCT in the last 72 hours. Sepsis Labs:  Recent Labs Lab 06/11/16 1805 06/13/16 0708  PROCALCITON 0.43 0.34    Recent Results (from the past 240 hour(s))  MRSA PCR Screening     Status: None   Collection Time: 06/13/16  1:50 PM  Result Value Ref Range Status   MRSA by PCR NEGATIVE NEGATIVE Final    Comment:        The GeneXpert MRSA Assay (FDA approved for NASAL specimens only), is one component of a comprehensive MRSA colonization surveillance program. It is not intended to diagnose MRSA infection nor to guide or monitor treatment for MRSA infections.          Radiology Studies: Dg Chest Port 1 View  Result Date: 06/13/2016 CLINICAL DATA:  Central catheter  placement EXAM: PORTABLE CHEST 1 VIEW COMPARISON:  June 11, 2016 FINDINGS: Central catheter tip is in the superior vena cava. No pneumothorax. There is mild atelectasis in the left base with minimal left pleural effusion. Lungs elsewhere are clear. Heart is enlarged with pulmonary vascularity within normal limits. There is atherosclerotic calcification in the aorta. Patient is status post median sternotomy. There are surgical clips in the left cervical -thoracic junction. No evident adenopathy. No bone lesions. IMPRESSION: Central catheter tip in superior vena cava. No pneumothorax. Mild left base atelectasis with small left pleural effusion. Lungs elsewhere clear. Stable cardiomegaly. Aortic atherosclerosis. Electronically Signed   By: Lowella Grip III M.D.   On: 06/13/2016 15:51        Scheduled Meds: . allopurinol  100 mg Oral Daily  . cholestyramine light  4 g Oral Q12H  . furosemide  120 mg Intravenous Q8H  . guaiFENesin  1,200 mg Oral BID  . heparin  5,000 Units Subcutaneous Q8H  . insulin aspart  0-15 Units Subcutaneous TID WC  . insulin aspart  0-5 Units Subcutaneous QHS  . insulin aspart  3 Units Subcutaneous TID WC  . insulin glargine  5 Units Subcutaneous BH-q7a  . ipratropium-albuterol  3 mL Nebulization Q6H  . lactulose  30 g Oral TID  . levofloxacin  500 mg Oral Q48H  . metolazone  5 mg Oral BID  . pantoprazole  40 mg Oral Daily  . predniSONE  40 mg Oral Q breakfast  . rifaximin  400 mg Oral TID  . sodium chloride flush  10-40 mL Intracatheter Q12H  . sodium chloride flush  3 mL Intravenous Q12H  . sodium chloride flush  3 mL Intravenous Q12H  . sodium chloride flush  3 mL Intravenous Q12H   Continuous Infusions: . milrinone 0.25 mcg/kg/min (06/14/16 0545)     LOS: 3 days    Time spent: 30 minutes    Dessa Phi, DO [facility] Pager [phone]  If 7PM-7AM, please contact night-coverage [email or web address] Password [facility] 06/14/2016, 2:46 PM

## 2016-06-14 NOTE — Progress Notes (Signed)
Patient had afib with sinus tach in 130-140's and then heart rate comes down to 90's-100's with PAC and PVC. PA was paged and ordered 12 lead EKG. EKG performed and notified the PA again. No orders yet. Patient is asymptomatic. Patient has been voiding fine and denied any pain or discomfort. CVP was 23 and the doctor is aware of it. Will continue to monitor the patient closely.

## 2016-06-14 NOTE — Evaluation (Signed)
Physical Therapy Evaluation Patient Details Name: Cassandra Bonilla MRN: [id number] DOB: March 03, 1941 Today's Date: 06/14/2016   History of Present Illness  75 year old African-American female with past medical history of chronic atrial fibrillation on Coumadin, chronic systolic heart failure with EF 45%, chronic anemia, CKD IV-V, HTN, HLD, DM, carotid artery stenosis s/p CEA and CAD s/p 4v CABG 1993 presenting with 5 day history of worsening cough wheezing and shortness of breath; +acute on chronic CHF and COPD    Clinical Impression  Pt admitted with above diagnosis. Lethargic with dyspnea during evaluation, therefore limited functional assessment completed. Noted pt with recent return home from SNF. Will need further functional assessment to be able to assist with discharge planning. Pt currently with functional limitations due to the deficits listed below (see PT Problem List).  Pt will benefit from skilled PT to increase their independence and safety with mobility to allow discharge to the venue listed below.       Follow Up Recommendations  (TBA as alertness/medically improves)    Equipment Recommendations  None recommended by PT    Recommendations for Other Services OT consult     Precautions / Restrictions Precautions Precautions: Fall      Mobility  Bed Mobility Overal bed mobility: Needs Assistance Bed Mobility: Rolling Rolling: Mod assist         General bed mobility comments: partly limited by lethargy; more alert once up in chair position; further activity deferred (partially due to dyspnea and CVP 23)  Transfers                 General transfer comment: deferred  Ambulation/Gait                Stairs            Wheelchair Mobility    Modified Rankin (Stroke Patients Only)       Balance                                             Pertinent Vitals/Pain SaO2 98-100%, +wheezing, RR 18-25 (with limited activity)   Pain  Assessment: Faces Faces Pain Scale: Hurts even more Pain Location: "bottom" when turned supine from side Pain Descriptors / Indicators: Discomfort;Grimacing Pain Intervention(s): Monitored during session;Repositioned    Home Living Family/patient expects to be discharged to:: Private residence Living Arrangements: Spouse/significant other;Children Available Help at Discharge: Family;Available 24 hours/day Type of Home: House Home Access: Stairs to enter Entrance Stairs-Rails: Psychiatric nurse of Steps: 6 Home Layout: One level Home Equipment: Walker - 2 wheels;Tub bench      Prior Function Level of Independence: Needs assistance   Gait / Transfers Assistance Needed: multiple hospital and recent SNF admission; was back home and walking without a device           Hand Dominance   Dominant Hand: Right    Extremity/Trunk Assessment   Upper Extremity Assessment: Generalized weakness (AAROM WFL-assist due to lethargy; triceps 4/5)           Lower Extremity Assessment: RLE deficits/detail;LLE deficits/detail;Generalized weakness RLE Deficits / Details: AAROM WFL except dorsiflexion to neutral;  LLE Deficits / Details: AAROM WFL except dorsiflexion to neutral;      Communication   Communication: No difficulties  Cognition Arousal/Alertness: Lethargic Behavior During Therapy: Flat affect Overall Cognitive Status: No family/caregiver present to determine baseline cognitive functioning (oriented x 4)  General Comments General comments (skin integrity, edema, etc.): Patient initially very lethargic despite max attempts to maintain alertness. Improved once up in chair position and discussed waking up to eat her breakfast.     Exercises     Assessment/Plan    PT Assessment Patient needs continued PT services  PT Problem List Decreased strength;Decreased activity tolerance;Decreased mobility;Decreased balance;Decreased  knowledge of use of DME;Decreased range of motion;Cardiopulmonary status limiting activity;Obesity;Pain          PT Treatment Interventions DME instruction;Gait training;Stair training;Functional mobility training;Therapeutic activities;Therapeutic exercise;Balance training;Patient/family education    PT Goals (Current goals can be found in the Care Plan section)  Acute Rehab PT Goals Patient Stated Goal: to keep her strength while here in the hospital PT Goal Formulation: With patient Time For Goal Achievement: 06/28/16 Potential to Achieve Goals: Good    Frequency Min 3X/week   Barriers to discharge Inaccessible home environment 6 steps to enter home    Co-evaluation               End of Session   Activity Tolerance: Patient limited by lethargy;Treatment limited secondary to medical complications (Comment) (wheezing; CVP 23) Patient left: in bed;with call bell/phone within reach;with nursing/sitter in room Nurse Communication: Other (comment) (pt position for breakfast; reported sore bottom)         Time: [id number] PT Time Calculation (min) (ACUTE ONLY): 21 min   Charges:   PT Evaluation $PT Eval Low Complexity: 1 Procedure     PT G Codes:        Zyhir Cappella 06-19-16, 9:36 AM Pager [phone]

## 2016-06-14 NOTE — Progress Notes (Addendum)
Called by nurse about patient's HR jumping between 90s and 120s-130s, asymptomatic, BP stable. Chart reviewed in depth, complex patient. Patient is reported to be in chronic afib per chart. By telemetry this evening I am suspicious she may actually have a wandering atrial pacemaker/MAT at baseline (HR 90s) with bursts of atrial arrhythmia either atrial flutter or PAT (120s-130s). Reviewed with Dr. Marlou Porch who agrees. EKG this AM showed possible accelerated junctional rhythm. There is what appears to be AV delay between P and V waves so may have concomitant conduction disease. Per d/w Dr. Marlou Porch will hold off on IV amiodarone for now given possible conduction disease, asymptomatic, and stable BP - continue current med regimen otherwise with continued diuresis. Nurse aware of plan. EKG in chart for review by CHF team in AM. Melina Copa PA-C

## 2016-06-15 LAB — CBC WITH DIFFERENTIAL/PLATELET
BASOS ABS: 0 10*3/uL (ref 0.0–0.1)
Basophils Relative: 0 %
EOS PCT: 0 %
Eosinophils Absolute: 0 10*3/uL (ref 0.0–0.7)
HEMATOCRIT: 27.1 % — AB (ref 36.0–46.0)
HEMOGLOBIN: 8.3 g/dL — AB (ref 12.0–15.0)
LYMPHS ABS: 0.2 10*3/uL — AB (ref 0.7–4.0)
LYMPHS PCT: 3 %
MCH: 25.6 pg — AB (ref 26.0–34.0)
MCHC: 30.6 g/dL (ref 30.0–36.0)
MCV: 83.6 fL (ref 78.0–100.0)
Monocytes Absolute: 0.4 10*3/uL (ref 0.1–1.0)
Monocytes Relative: 7 %
NEUTROS ABS: 5.8 10*3/uL (ref 1.7–7.7)
NEUTROS PCT: 90 %
PLATELETS: 200 10*3/uL (ref 150–400)
RBC: 3.24 MIL/uL — AB (ref 3.87–5.11)
RDW: 17.9 % — ABNORMAL HIGH (ref 11.5–15.5)
WBC: 6.3 10*3/uL (ref 4.0–10.5)

## 2016-06-15 LAB — BASIC METABOLIC PANEL
ANION GAP: 10 (ref 5–15)
BUN: 93 mg/dL — AB (ref 6–20)
CHLORIDE: 104 mmol/L (ref 101–111)
CO2: 20 mmol/L — ABNORMAL LOW (ref 22–32)
Calcium: 8.2 mg/dL — ABNORMAL LOW (ref 8.9–10.3)
Creatinine, Ser: 3.3 mg/dL — ABNORMAL HIGH (ref 0.44–1.00)
GFR, EST AFRICAN AMERICAN: 15 mL/min — AB (ref >60)
GFR, EST NON AFRICAN AMERICAN: 13 mL/min — AB (ref >60)
Glucose, Bld: 260 mg/dL — ABNORMAL HIGH (ref 65–99)
POTASSIUM: 3.8 mmol/L (ref 3.5–5.1)
SODIUM: 134 mmol/L — AB (ref 135–145)

## 2016-06-15 LAB — GLUCOSE, CAPILLARY
GLUCOSE-CAPILLARY: 253 mg/dL — AB (ref 65–99)
GLUCOSE-CAPILLARY: 345 mg/dL — AB (ref 65–99)
Glucose-Capillary: 231 mg/dL — ABNORMAL HIGH (ref 65–99)
Glucose-Capillary: 311 mg/dL — ABNORMAL HIGH (ref 65–99)

## 2016-06-15 LAB — CARBOXYHEMOGLOBIN
Carboxyhemoglobin: 1.2 % (ref 0.5–1.5)
Methemoglobin: 1.1 % (ref 0.0–1.5)
O2 Saturation: 59 %
Total hemoglobin: 9.1 g/dL — ABNORMAL LOW (ref 12.0–16.0)

## 2016-06-15 MED ORDER — METHYLPREDNISOLONE SODIUM SUCC 125 MG IJ SOLR
60.0000 mg | Freq: Two times a day (BID) | INTRAMUSCULAR | Status: DC
  Administered 2016-06-15 – 2016-06-21 (×14): 60 mg via INTRAVENOUS
  Filled 2016-06-15 (×14): qty 2

## 2016-06-15 MED ORDER — IPRATROPIUM-ALBUTEROL 0.5-2.5 (3) MG/3ML IN SOLN
3.0000 mL | Freq: Three times a day (TID) | RESPIRATORY_TRACT | Status: DC
  Administered 2016-06-16 – 2016-06-25 (×27): 3 mL via RESPIRATORY_TRACT
  Filled 2016-06-15 (×28): qty 3

## 2016-06-15 NOTE — Progress Notes (Signed)
Notified Md about hr 150-s.   EKG obtained. = SVT Pt asymptomatic .  Will continue to monitor. [name]

## 2016-06-15 NOTE — Progress Notes (Addendum)
Patient ID: Cassandra Bonilla, female   DOB: 1940/12/06, 75 y.o.   MRN: [id number]   Patient Name: Cassandra Bonilla Date of Encounter: 06/15/2016  Primary Cardiologist: Dr. Oval Linsey    Subjective   Chronically ill woman with severe COPD and mixed systolic/diastolic HF and pulmonary HTN. Underwent RHC which showed severely elevated biventricular pressures with low cardiac output and RV strain. Moved to SDU. PICC placed and milrinone started. Increased lasix.   Coox 59% this am on milrinone 0.25 mcg/kg/min. CVP 22 this am.   Breathing comfortable at rest.     She is in atrial fibrillation with HR in 100s.   I/O net negative -[id number] with high dose diuretics yesterday.  Weight down. Creatinine a bit lower.   [place] 06/13/16  RA = 30 RV = 68/26 PA = 69/25 (43) PCW = 33 Fick cardiac output/index = 3.7/2.1 Thermo CO/CI = 4.0/2.3 PVR = 2.5 WU Ao sat = 99% PA sat = 45%, 46%  Echo (9/17) with EF 40-45%, PASP 75, RV mildly dilated/mild-moderately decreased systolic function, severe TR.   Inpatient Medications    Scheduled Meds: . allopurinol  100 mg Oral Daily  . cholestyramine light  4 g Oral Q12H  . furosemide  120 mg Intravenous Q8H  . guaiFENesin  1,200 mg Oral BID  . heparin  5,000 Units Subcutaneous Q8H  . insulin aspart  0-15 Units Subcutaneous TID WC  . insulin aspart  0-5 Units Subcutaneous QHS  . insulin aspart  3 Units Subcutaneous TID WC  . insulin glargine  5 Units Subcutaneous BH-q7a  . ipratropium-albuterol  3 mL Nebulization Q6H  . lactulose  30 g Oral TID  . levofloxacin  500 mg Oral Q48H  . metolazone  5 mg Oral BID  . pantoprazole  40 mg Oral Daily  . predniSONE  40 mg Oral Q breakfast  . rifaximin  400 mg Oral TID  . sodium chloride flush  10-40 mL Intracatheter Q12H  . sodium chloride flush  3 mL Intravenous Q12H   Continuous Infusions: . milrinone 0.25 mcg/kg/min (06/14/16 2149)   PRN Meds:.sodium chloride, sodium chloride, acetaminophen, albuterol, hydrOXYzine,  ondansetron **OR** ondansetron (ZOFRAN) IV, sodium chloride flush, sodium chloride flush   Vital Signs    Vitals:   06/15/16 0123 06/15/16 0320 06/15/16 0400 06/15/16 0715  BP: (!) 143/81  (!) 139/52 (!) 109/93  Pulse: 98 (!) 138 (!) 145 (!) 117  Resp: 14 12 14 14   Temp:  97.7 F (36.5 C)  97.4 F (36.3 C)  TempSrc:  Oral  Oral  SpO2: 94% 98% 94% 100%  Weight:  159 lb 6.3 oz (72.3 kg)    Height:        Intake/Output Summary (Last 24 hours) at 06/15/16 0805 Last data filed at 06/15/16 0700  Gross per 24 hour  Intake           1137.1 ml  Output             2790 ml  Net          -1652.9 ml   Filed Weights   06/13/16 1350 06/14/16 0500 06/15/16 0320  Weight: 161 lb 2.5 oz (73.1 kg) 167 lb 12.3 oz (76.1 kg) 159 lb 6.3 oz (72.3 kg)    Physical Exam   GEN: Elderly and chronically ill appearing.   HEENT: Normal Neck: Supple, JVD to jaw, carotid bruits, or masses. Cardiac: RRR, no murmurs, rubs, or gallops. No clubbing, cyanosis, edema.  Radials/DP/PT 2+ and equal bilaterally.  Respiratory:  Wheezing/rhonchi throughout.  GI: Soft, NT, ND, no HSM. No bruits or masses. +BS  MS: no deformity or atrophy. Skin: warm and dry, no rash. Neuro:  Strength and sensation are intact. Psych: Alert to person.  Flat affect.  Labs    CBC  Recent Labs  06/14/16 0500 06/15/16 0320  WBC 4.1 6.3  NEUTROABS 3.6 5.8  HGB 8.2* 8.3*  HCT 27.1* 27.1*  MCV 83.4 83.6  PLT 214 [id number]   Basic Metabolic Panel  Recent Labs  06/14/16 0500 06/15/16 0320  NA 135 134*  K 4.2 3.8  CL 105 104  CO2 20* 20*  GLUCOSE 182* 260*  BUN 95* 93*  CREATININE 3.48* 3.30*  CALCIUM 8.1* 8.2*   Liver Function Tests No results for input(s): AST, ALT, ALKPHOS, BILITOT, PROT, ALBUMIN in the last 72 hours. Cardiac Enzymes No results for input(s): CKTOTAL, CKMB, CKMBINDEX, TROPONINI in the last 72 hours.  Telemetry     atrial fibrillation- Personally Reviewed   Radiology    Dg Chest Port 1  View  Result Date: 06/13/2016 CLINICAL DATA:  Central catheter placement EXAM: PORTABLE CHEST 1 VIEW COMPARISON:  June 11, 2016 FINDINGS: Central catheter tip is in the superior vena cava. No pneumothorax. There is mild atelectasis in the left base with minimal left pleural effusion. Lungs elsewhere are clear. Heart is enlarged with pulmonary vascularity within normal limits. There is atherosclerotic calcification in the aorta. Patient is status post median sternotomy. There are surgical clips in the left cervical -thoracic junction. No evident adenopathy. No bone lesions. IMPRESSION: Central catheter tip in superior vena cava. No pneumothorax. Mild left base atelectasis with small left pleural effusion. Lungs elsewhere clear. Stable cardiomegaly. Aortic atherosclerosis. Electronically Signed   By: Lowella Grip III M.D.   On: 06/13/2016 15:51     Cardiac Studies  RHC pending   Transthoracic Echocardiography 05/26/16 Study Conclusions  - Left ventricle: The cavity size was normal. Wall thickness was   increased in a pattern of mild LVH. Systolic function was mildly   to moderately reduced. The estimated ejection fraction was in the   range of 40% to 45%. - Ventricular septum: The contour showed diastolic flattening and   systolic flattening. - Aortic valve: Severely calcified annulus. - Mitral valve: There was mild to moderate regurgitation. - Left atrium: The atrium was moderately dilated. - Right ventricle: The cavity size was mildly dilated. Systolic   function was mildly to moderately reduced. - Right atrium: The atrium was mildly dilated. - Tricuspid valve: There was severe regurgitation. - Pulmonary arteries: Systolic pressure was severely increased. PA   peak pressure: 75 mm Hg (S).  Patient Profile  75 yo female with PMH of chronic systolic and diastolic heart failure (LVEF 40-45%), chronic atrial fibrillation COPD, carotid stenosis s/p CEA, CAD s/p CABG, diabetes  mellitus, CKD 4, GI bleed, cryptogenic cirrhosis, and severe pulmonary hypertension who presented with worsening dyspnea.      Assessment   1. Acute on chronic combined biventricular failure: Likely end stage 2. Pulmonary HTN: Looks like primarily pulmonary venous hypertension.  3. Severe COPD 4. AKI on CKD IV 5. Hx of hepatic encephalopathy and cryptogenic cirrhosis 6. Chronic atrial fibrillation.   Plan    PICC placed and started on milrinone with elevated filling pressures, low output, and marked volume overload.      Better UOP yesterday but still not vigorous given amount of diuretic.  She is still very volume overloaded.  Continue current Lasix  IV + metolazone + milrinone.  Co-ox adequate at 59%.  BUN/creatinine slightly lower today.   On levaquin 5 doses for COPD exacerbation.  On prednisone and nebs, still wheezing.   Continue lactulose for hepatic encephalopathy. Ammonia 42.   Chronic atrial fibrillation with HR in 100s.  Ok for now, if HR trends up could use amiodarone for rate control.   Prognosis very tenuous. 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  Would be reasonable to involve palliative care.   Signed, Loralie Champagne, MD  06/15/2016, 8:05 AM   Advanced Heart Failure Team Pager [phone] (M-F; 7a - 4p)  Please contact [facility] for night-coverage after hours (4p -7a ) and weekends on [email or web address]

## 2016-06-15 NOTE — Progress Notes (Signed)
PROGRESS NOTE    Cassandra Bonilla  [id number] DOB: 08-Mar-1941 DOA: 06/11/2016 PCP: Marco Collie, MD      Brief Narrative:  Cassandra Bonilla is a 75 y.o. female with medical history significant of CAD, COPD, CAD, DM, gout, HTN, presenting with 5 day history of worsening cough wheezing and shortness of breath. Patient reports one day ago saying her cardiologist to switch her from furosemide to torsemide due to concern for volume overload. No increased diuresis after changing to torsemide. Associated with generalized weakness and increased respiratory effort. Cough is occasionally productive. No home inhalers. Patient was admitted for respiratory distress due to COPD exacerbation and CHF exacerbation.   Assessment & Plan:   Principal Problem:   Acute on chronic combined systolic and diastolic heart failure (HCC) Active Problems:   Chronic atrial fibrillation (HCC)   Type 2 diabetes mellitus with stage 4 chronic kidney disease, with long-term current use of insulin (HCC)   CKD (chronic kidney disease), stage IV (HCC)   Encephalopathy, hepatic (HCC)   Cryptogenic cirrhosis of liver (HCC)   COPD exacerbation (HCC)   Hyperkalemia   Respiratory distress   Gout   GERD (gastroesophageal reflux disease)   Essential hypertension   Acute on chronic combined systolic and diastolic CHF exacerbation and pHTN  -BNP on admission 4262  -EF on 04/21/16: 30-35%, echo showed global hypokinesis with akinesis of inferior and inf-lat wall  -EF on 05/26/16: 40-45%  -Daily weights, Strict I/Os  -S/p RHC 9/28: "Severely elevated biventricular pressures with moderate pulmonary venous HTN and evidence of right heart failure"  -Diuresis and optimization with lasix, metolazone, milrinone  -Appreciate cardiology, heart failure team -Consult palliative care today. I did discuss with patient and patient's husband, Cassandra Bonilla, regarding patient's poor prognosis and end stage cardiomyopathy. He seemed to understand and agreed to  meet with palliative care.   COPD exacerbation -Levaquin for total 10 days to cover bronchitis  -Duonebs  -Switch steroids back to IV    Elevated troponin -Likely secondary to CHF exacerbation as well as decreased renal function -No chest pain   -No ST changes on admission EKG    Chronic atrial fibrillation  -Recently admitted for GI bleed, coumadin held  -CHA2DS2-VASc 6 -Continue metoprolol   DM type 2, well controlled, with complication including CKD stage 4-5  -9/8 hemoglobin A1c= 7.4 -Continue lantus 5 units QAM -Appreciate diabetic coordinator. Increased SSI to moderate, hs coverage, and 3u with meals TID   CKD stage 4-5  -Cr fluctuates between 2.5-3.5 at baseline  -Monitor BMP  -Has follow up [facility]  Hepatic encephalopathy and cryptogenic cirrhosis: previously admitted with hepatic encephalopathy  -Continue Lactulose, Xifaxan -Follows [facility]   Gout -Continue allopurinol  HTN -Continue metoprolol   GERD: -Continue protonix   DVT prophylaxis: subq hep  Code Status: Full Family Communication: spoke with husband today  Disposition Plan: Continue diuresis and management of this patient's heart failure and pulm HTN. Consult palliative care today. I did discuss with patient and patient's husband, Cassandra Bonilla, regarding patient's poor prognosis and end stage cardiomyopathy. He seemed to understand and agreed to meet with palliative care.   Consultants:   Cardiology   Advanced heart failure   Procedures:   Right heart cath 9/28   Antimicrobials:  Levaquin 9/26 >> total 10 day course    Subjective: Patient denies any complaints today. She denies any chest pain, shortness of breath, nausea, vomiting.   Objective: Vitals:   06/15/16 0400 06/15/16 0715 06/15/16 0800  06/15/16 0907  BP: (!) 139/52 (!) 109/93 (!) 129/59   Pulse: (!) 145 (!) 117 (!) 108   Resp: 14 14 12    Temp:  97.4 F (36.3 C)    TempSrc:   Oral    SpO2: 94% 100% 96% 97%  Weight:      Height:        Intake/Output Summary (Last 24 hours) at 06/15/16 1129 Last data filed at 06/15/16 1100  Gross per 24 hour  Intake           1017.1 ml  Output             2825 ml  Net          -1807.9 ml   Filed Weights   06/13/16 1350 06/14/16 0500 06/15/16 0320  Weight: 73.1 kg (161 lb 2.5 oz) 76.1 kg (167 lb 12.3 oz) 72.3 kg (159 lb 6.3 oz)    Examination:  General exam: Appears calm and comfortable  Respiratory system: Decreased inspiratory effort. +diffuse expiratory rhonchi today. Respiratory effort normal. Cardiovascular system: S1 & S2 heard, irregular rhythm, tachycardic Gastrointestinal system: Abdomen is nondistended, soft and nontender. No organomegaly or masses felt. Normal bowel sounds heard. Central nervous system: Alert and oriented to self, place, time.  Extremities: Symmetric Skin: No rashes, lesions or ulcers Psychiatry: Judgement and insight appear poor  Data Reviewed: I have personally reviewed following labs and imaging studies  CBC:  Recent Labs Lab 06/10/16 1305  06/11/16 1805 06/12/16 0341 06/13/16 0708 06/14/16 0500 06/15/16 0320  WBC 6.4  < > 5.7 3.4* 3.1* 4.1 6.3  NEUTROABS 4,480  --   --   --  2.7 3.6 5.8  HGB 9.5*  < > 9.5* 9.5* 8.9* 8.2* 8.3*  HCT 32.3*  < > 32.6* 32.7* 30.0* 27.1* 27.1*  MCV 86.8  < > 87.4 86.5 84.7 83.4 83.6  PLT 244  < > 264 274 242 214 200  < > = values in this interval not displayed. Basic Metabolic Panel:  Recent Labs Lab 06/11/16 1148 06/11/16 1247 06/11/16 1805 06/12/16 0341 06/13/16 0708 06/14/16 0500 06/15/16 0320  NA 136  --   --  136 132* 135 134*  K 5.6* 5.9*  --  5.7* 5.3* 4.2 3.8  CL 106  --   --  104 103 105 104  CO2 18*  --   --  16* 16* 20* 20*  GLUCOSE 106*  --   --  322* 267* 182* 260*  BUN 83*  --   --  84* 95* 95* 93*  CREATININE 3.41*  --  3.59* 3.65* 3.53* 3.48* 3.30*  CALCIUM 8.2*  --   --  8.3* 8.0* 8.1* 8.2*  MG  --   --  1.9  --    --   --   --   PHOS  --   --  5.5*  --   --   --   --    GFR: Estimated Creatinine Clearance: 13.7 mL/min (by C-G formula based on SCr of 3.3 mg/dL (H)). Liver Function Tests:  Recent Labs Lab 06/11/16 1148 06/12/16 0341  AST 26 26  ALT 18 18  ALKPHOS 178* 184*  BILITOT 1.0 1.4*  PROT 7.0 7.0  ALBUMIN 2.8* 2.8*   No results for input(s): LIPASE, AMYLASE in the last 168 hours.  Recent Labs Lab 06/11/16 1821 06/14/16 1250  AMMONIA 64* 42*   Coagulation Profile:  Recent Labs Lab 06/11/16 1148  INR 1.51  Cardiac Enzymes:  Recent Labs Lab 06/11/16 1805  TROPONINI 0.05*   BNP (last 3 results) No results for input(s): PROBNP in the last 8760 hours. HbA1C: No results for input(s): HGBA1C in the last 72 hours. CBG:  Recent Labs Lab 06/14/16 0828 06/14/16 1151 06/14/16 1550 06/14/16 2222 06/15/16 0713  GLUCAP 183* 219* 162* 181* 231*   Lipid Profile: No results for input(s): CHOL, HDL, LDLCALC, TRIG, CHOLHDL, LDLDIRECT in the last 72 hours. Thyroid Function Tests: No results for input(s): TSH, T4TOTAL, FREET4, T3FREE, THYROIDAB in the last 72 hours. Anemia Panel: No results for input(s): VITAMINB12, FOLATE, FERRITIN, TIBC, IRON, RETICCTPCT in the last 72 hours. Sepsis Labs:  Recent Labs Lab 06/11/16 1805 06/13/16 0708  PROCALCITON 0.43 0.34    Recent Results (from the past 240 hour(s))  MRSA PCR Screening     Status: None   Collection Time: 06/13/16  1:50 PM  Result Value Ref Range Status   MRSA by PCR NEGATIVE NEGATIVE Final    Comment:        The GeneXpert MRSA Assay (FDA approved for NASAL specimens only), is one component of a comprehensive MRSA colonization surveillance program. It is not intended to diagnose MRSA infection nor to guide or monitor treatment for MRSA infections.          Radiology Studies: Dg Chest Port 1 View  Result Date: 06/13/2016 CLINICAL DATA:  Central catheter placement EXAM: PORTABLE CHEST 1 VIEW  COMPARISON:  June 11, 2016 FINDINGS: Central catheter tip is in the superior vena cava. No pneumothorax. There is mild atelectasis in the left base with minimal left pleural effusion. Lungs elsewhere are clear. Heart is enlarged with pulmonary vascularity within normal limits. There is atherosclerotic calcification in the aorta. Patient is status post median sternotomy. There are surgical clips in the left cervical -thoracic junction. No evident adenopathy. No bone lesions. IMPRESSION: Central catheter tip in superior vena cava. No pneumothorax. Mild left base atelectasis with small left pleural effusion. Lungs elsewhere clear. Stable cardiomegaly. Aortic atherosclerosis. Electronically Signed   By: Lowella Grip III M.D.   On: 06/13/2016 15:51        Scheduled Meds: . allopurinol  100 mg Oral Daily  . cholestyramine light  4 g Oral Q12H  . furosemide  120 mg Intravenous Q8H  . guaiFENesin  1,200 mg Oral BID  . heparin  5,000 Units Subcutaneous Q8H  . insulin aspart  0-15 Units Subcutaneous TID WC  . insulin aspart  0-5 Units Subcutaneous QHS  . insulin aspart  3 Units Subcutaneous TID WC  . insulin glargine  5 Units Subcutaneous BH-q7a  . ipratropium-albuterol  3 mL Nebulization Q6H  . lactulose  30 g Oral TID  . levofloxacin  500 mg Oral Q48H  . methylPREDNISolone (SOLU-MEDROL) injection  60 mg Intravenous Q12H  . metolazone  5 mg Oral BID  . pantoprazole  40 mg Oral Daily  . rifaximin  400 mg Oral TID  . sodium chloride flush  10-40 mL Intracatheter Q12H  . sodium chloride flush  3 mL Intravenous Q12H   Continuous Infusions: . milrinone 0.25 mcg/kg/min (06/14/16 2149)     LOS: 4 days    Time spent: 30 minutes    Dessa Phi, DO [facility] Pager [phone]  If 7PM-7AM, please contact night-coverage [email or web address] Password [email or web address] 06/15/2016, 11:29 AM

## 2016-06-16 DIAGNOSIS — J441 Chronic obstructive pulmonary disease with (acute) exacerbation: Secondary | ICD-10-CM

## 2016-06-16 LAB — BASIC METABOLIC PANEL
Anion gap: 12 (ref 5–15)
BUN: 97 mg/dL — AB (ref 6–20)
CO2: 21 mmol/L — ABNORMAL LOW (ref 22–32)
CREATININE: 3.21 mg/dL — AB (ref 0.44–1.00)
Calcium: 8.6 mg/dL — ABNORMAL LOW (ref 8.9–10.3)
Chloride: 101 mmol/L (ref 101–111)
GFR calc Af Amer: 15 mL/min — ABNORMAL LOW (ref >60)
GFR, EST NON AFRICAN AMERICAN: 13 mL/min — AB (ref >60)
Glucose, Bld: 257 mg/dL — ABNORMAL HIGH (ref 65–99)
POTASSIUM: 3.8 mmol/L (ref 3.5–5.1)
SODIUM: 134 mmol/L — AB (ref 135–145)

## 2016-06-16 LAB — GLUCOSE, CAPILLARY
Glucose-Capillary: 302 mg/dL — ABNORMAL HIGH (ref 65–99)
Glucose-Capillary: 302 mg/dL — ABNORMAL HIGH (ref 65–99)
Glucose-Capillary: 258 mg/dL — ABNORMAL HIGH (ref 65–99)
Glucose-Capillary: 164 mg/dL — ABNORMAL HIGH (ref 65–99)

## 2016-06-16 LAB — CBC WITH DIFFERENTIAL/PLATELET
Basophils Absolute: 0 10*3/uL (ref 0.0–0.1)
Basophils Relative: 0 %
EOS PCT: 0 %
Eosinophils Absolute: 0 10*3/uL (ref 0.0–0.7)
HCT: 28.2 % — ABNORMAL LOW (ref 36.0–46.0)
Hemoglobin: 8.4 g/dL — ABNORMAL LOW (ref 12.0–15.0)
LYMPHS ABS: 0.1 10*3/uL — AB (ref 0.7–4.0)
LYMPHS PCT: 2 %
MCH: 24.6 pg — AB (ref 26.0–34.0)
MCHC: 29.8 g/dL — ABNORMAL LOW (ref 30.0–36.0)
MCV: 82.5 fL (ref 78.0–100.0)
MONO ABS: 0.2 10*3/uL (ref 0.1–1.0)
Monocytes Relative: 4 %
Neutro Abs: 4.1 10*3/uL (ref 1.7–7.7)
Neutrophils Relative %: 94 %
PLATELETS: 193 10*3/uL (ref 150–400)
RBC: 3.42 MIL/uL — ABNORMAL LOW (ref 3.87–5.11)
RDW: 17.8 % — AB (ref 11.5–15.5)
WBC: 4.4 10*3/uL (ref 4.0–10.5)

## 2016-06-16 LAB — CARBOXYHEMOGLOBIN
Carboxyhemoglobin: 1.5 % (ref 0.5–1.5)
METHEMOGLOBIN: 1 % (ref 0.0–1.5)
O2 Saturation: 64.9 %
Total hemoglobin: 8.5 g/dL — ABNORMAL LOW (ref 12.0–16.0)

## 2016-06-16 MED ORDER — INSULIN ASPART 100 UNIT/ML ~~LOC~~ SOLN: 0.0000 [IU] | Freq: Every day | SUBCUTANEOUS | Status: DC

## 2016-06-16 MED ORDER — INSULIN ASPART 100 UNIT/ML ~~LOC~~ SOLN
0.0000 [IU] | Freq: Three times a day (TID) | SUBCUTANEOUS | Status: DC
  Administered 2016-06-16: 15 [IU] via SUBCUTANEOUS
  Administered 2016-06-16: 11 [IU] via SUBCUTANEOUS
  Administered 2016-06-17: 7 [IU] via SUBCUTANEOUS
  Administered 2016-06-17: 11 [IU] via SUBCUTANEOUS
  Administered 2016-06-17: 7 [IU] via SUBCUTANEOUS
  Administered 2016-06-18: 4 [IU] via SUBCUTANEOUS
  Administered 2016-06-18 (×2): 7 [IU] via SUBCUTANEOUS
  Administered 2016-06-19: 3 [IU] via SUBCUTANEOUS
  Administered 2016-06-19: 4 [IU] via SUBCUTANEOUS
  Administered 2016-06-20 (×2): 7 [IU] via SUBCUTANEOUS
  Administered 2016-06-21 (×2): 4 [IU] via SUBCUTANEOUS
  Administered 2016-06-22 (×3): 7 [IU] via SUBCUTANEOUS
  Administered 2016-06-23: 3 [IU] via SUBCUTANEOUS
  Administered 2016-06-23: 4 [IU] via SUBCUTANEOUS
  Administered 2016-06-23: 3 [IU] via SUBCUTANEOUS
  Administered 2016-06-24: 4 [IU] via SUBCUTANEOUS
  Administered 2016-06-24: 3 [IU] via SUBCUTANEOUS
  Administered 2016-06-24: 4 [IU] via SUBCUTANEOUS
  Administered 2016-06-25: 7 [IU] via SUBCUTANEOUS
  Administered 2016-06-25: 4 [IU] via SUBCUTANEOUS

## 2016-06-16 MED ORDER — AMIODARONE HCL IN DEXTROSE 360-4.14 MG/200ML-% IV SOLN
INTRAVENOUS | Status: AC
  Filled 2016-06-16: qty 200

## 2016-06-16 MED ORDER — INSULIN GLARGINE 100 UNIT/ML ~~LOC~~ SOLN
8.0000 [IU] | SUBCUTANEOUS | Status: DC
  Administered 2016-06-17 – 2016-06-20 (×4): 8 [IU] via SUBCUTANEOUS
  Filled 2016-06-16 (×4): qty 0.08

## 2016-06-16 MED ORDER — METOPROLOL TARTRATE 5 MG/5ML IV SOLN
5.0000 mg | INTRAVENOUS | Status: DC | PRN
  Administered 2016-06-16: 5 mg via INTRAVENOUS
  Filled 2016-06-16: qty 5

## 2016-06-16 MED ORDER — ISOSORBIDE MONONITRATE ER 30 MG PO TB24
30.0000 mg | ORAL_TABLET | Freq: Every day | ORAL | Status: DC
  Administered 2016-06-16 – 2016-06-27 (×12): 30 mg via ORAL
  Filled 2016-06-16 (×12): qty 1

## 2016-06-16 MED ORDER — AMIODARONE HCL IN DEXTROSE 360-4.14 MG/200ML-% IV SOLN
30.0000 mg/h | INTRAVENOUS | Status: DC
  Administered 2016-06-16 – 2016-06-20 (×9): 30 mg/h via INTRAVENOUS
  Filled 2016-06-16 (×9): qty 200

## 2016-06-16 MED ORDER — BENZONATATE 100 MG PO CAPS
100.0000 mg | ORAL_CAPSULE | Freq: Three times a day (TID) | ORAL | Status: DC
  Administered 2016-06-16 – 2016-06-27 (×34): 100 mg via ORAL
  Filled 2016-06-16 (×34): qty 1

## 2016-06-16 MED ORDER — DIPHENHYDRAMINE HCL 25 MG PO CAPS
25.0000 mg | ORAL_CAPSULE | Freq: Once | ORAL | Status: AC
  Administered 2016-06-16: 25 mg via ORAL
  Filled 2016-06-16: qty 1

## 2016-06-16 MED ORDER — AMIODARONE HCL IN DEXTROSE 360-4.14 MG/200ML-% IV SOLN
60.0000 mg/h | INTRAVENOUS | Status: AC
Start: 2016-06-16 — End: 2016-06-16
  Administered 2016-06-16: 60 mg/h via INTRAVENOUS

## 2016-06-16 MED ORDER — HYDRALAZINE HCL 25 MG PO TABS
12.5000 mg | ORAL_TABLET | Freq: Three times a day (TID) | ORAL | Status: DC
  Administered 2016-06-16 – 2016-06-27 (×28): 12.5 mg via ORAL
  Filled 2016-06-16 (×30): qty 1

## 2016-06-16 MED ORDER — FUROSEMIDE 10 MG/ML IJ SOLN
80.0000 mg | Freq: Once | INTRAMUSCULAR | Status: AC
Start: 2016-06-16 — End: 2016-06-16
  Administered 2016-06-16: 80 mg via INTRAVENOUS
  Filled 2016-06-16: qty 8

## 2016-06-16 MED ORDER — GUAIFENESIN-CODEINE 100-10 MG/5ML PO SOLN
5.0000 mL | ORAL | Status: DC | PRN
  Administered 2016-06-17 – 2016-06-18 (×2): 5 mL via ORAL
  Filled 2016-06-16 (×2): qty 5

## 2016-06-16 MED ORDER — CHOLESTYRAMINE LIGHT 4 G PO PACK
4.0000 g | PACK | Freq: Two times a day (BID) | ORAL | Status: DC
  Administered 2016-06-16 – 2016-06-27 (×18): 4 g via ORAL
  Filled 2016-06-16 (×25): qty 1

## 2016-06-16 NOTE — Progress Notes (Signed)
Patient ID: Cassandra Bonilla, female   DOB: [birth year], 75 y.o.   MRN: [id number]   Patient Name: Cassandra Bonilla Date of Encounter: 06/16/2016  Primary Cardiologist: Dr. Oval Linsey    Subjective   Chronically ill woman with severe COPD and mixed systolic/diastolic HF and pulmonary HTN. Underwent RHC which showed severely elevated biventricular pressures with low cardiac output and RV strain. Moved to SDU. PICC placed and milrinone started. Increased lasix.   Co-ox 65% this am on milrinone 0.25 mcg/kg/min. CVP > 20.    Breathing comfortable at rest.     She is in atrial fibrillation with HR in 110s-120s.   I/O net negative -1768 with high dose diuretics yesterday.  Weight down 3 lbs. Creatinine a bit lower.   [place] 06/13/16  RA = 30 RV = 68/26 PA = 69/25 (43) PCW = 33 Fick cardiac output/index = 3.7/2.1 Thermo CO/CI = 4.0/2.3 PVR = 2.5 WU Ao sat = 99% PA sat = 45%, 46%  Echo (9/17) with EF 40-45%, PASP 75, RV mildly dilated/mild-moderately decreased systolic function, severe TR.   Inpatient Medications    Scheduled Meds: . allopurinol  100 mg Oral Daily  . benzonatate  100 mg Oral TID  . cholestyramine light  4 g Oral Q12H  . furosemide  120 mg Intravenous Q8H  . heparin  5,000 Units Subcutaneous Q8H  . insulin aspart  0-15 Units Subcutaneous TID WC  . insulin aspart  0-5 Units Subcutaneous QHS  . insulin aspart  3 Units Subcutaneous TID WC  . insulin glargine  5 Units Subcutaneous BH-q7a  . ipratropium-albuterol  3 mL Nebulization TID  . lactulose  30 g Oral TID  . levofloxacin  500 mg Oral Q48H  . methylPREDNISolone (SOLU-MEDROL) injection  60 mg Intravenous Q12H  . metolazone  5 mg Oral BID  . pantoprazole  40 mg Oral Daily  . rifaximin  400 mg Oral TID  . sodium chloride flush  10-40 mL Intracatheter Q12H  . sodium chloride flush  3 mL Intravenous Q12H   Continuous Infusions: . milrinone 0.25 mcg/kg/min (06/16/16 0527)   PRN Meds:.sodium chloride, sodium chloride,  acetaminophen, albuterol, guaiFENesin-codeine, hydrOXYzine, metoprolol, ondansetron **OR** ondansetron (ZOFRAN) IV, sodium chloride flush, sodium chloride flush   Vital Signs    Vitals:   06/16/16 0000 06/16/16 0100 06/16/16 0323 06/16/16 0400  BP: (!) 155/80  (!) 155/91 (!) 157/94  Pulse: (!) 149 97 (!) 107 (!) 111  Resp: 16 18 15 15   Temp:   98.7 F (37.1 C)   TempSrc:   Oral   SpO2: 99% 100% 99% 97%  Weight:   156 lb 1.4 oz (70.8 kg)   Height:        Intake/Output Summary (Last 24 hours) at 06/16/16 0748 Last data filed at 06/16/16 0527  Gross per 24 hour  Intake             1282 ml  Output             3050 ml  Net            -1768 ml   Filed Weights   06/14/16 0500 06/15/16 0320 06/16/16 0323  Weight: 167 lb 12.3 oz (76.1 kg) 159 lb 6.3 oz (72.3 kg) 156 lb 1.4 oz (70.8 kg)    Physical Exam   GEN: Elderly and chronically ill appearing.   HEENT: Normal Neck: Supple, JVP to jaw, carotid bruits, or masses. Cardiac: RRR, no murmurs, rubs, or gallops. No clubbing, cyanosis, edema.  Radials/DP/PT 2+ and equal bilaterally.  Respiratory:  Wheezing/rhonchi throughout.  GI: Soft, NT, ND, no HSM. No bruits or masses. +BS  MS: no deformity or atrophy. Skin: warm and dry, no rash. Neuro:  Strength and sensation are intact. Psych: Alert to person.  Flat affect.  Labs    CBC  Recent Labs  06/15/16 0320 06/16/16 0335  WBC 6.3 4.4  NEUTROABS 5.8 4.1  HGB 8.3* 8.4*  HCT 27.1* 28.2*  MCV 83.6 82.5  PLT 200 0000000   Basic Metabolic Panel  Recent Labs  06/15/16 0320 06/16/16 0335  NA 134* 134*  K 3.8 3.8  CL 104 101  CO2 20* 21*  GLUCOSE 260* 257*  BUN 93* 97*  CREATININE 3.30* 3.21*  CALCIUM 8.2* 8.6*   Liver Function Tests No results for input(s): AST, ALT, ALKPHOS, BILITOT, PROT, ALBUMIN in the last 72 hours. Cardiac Enzymes No results for input(s): CKTOTAL, CKMB, CKMBINDEX, TROPONINI in the last 72 hours.  Telemetry     atrial fibrillation- Personally  Reviewed   Radiology    No results found.   Cardiac Studies  RHC pending   Transthoracic Echocardiography 05/26/16 Study Conclusions  - Left ventricle: The cavity size was normal. Wall thickness was   increased in a pattern of mild LVH. Systolic function was mildly   to moderately reduced. The estimated ejection fraction was in the   range of 40% to 45%. - Ventricular septum: The contour showed diastolic flattening and   systolic flattening. - Aortic valve: Severely calcified annulus. - Mitral valve: There was mild to moderate regurgitation. - Left atrium: The atrium was moderately dilated. - Right ventricle: The cavity size was mildly dilated. Systolic   function was mildly to moderately reduced. - Right atrium: The atrium was mildly dilated. - Tricuspid valve: There was severe regurgitation. - Pulmonary arteries: Systolic pressure was severely increased. PA   peak pressure: 75 mm Hg (S).  Patient Profile  75 yo female with PMH of chronic systolic and diastolic heart failure (LVEF 40-45%), chronic atrial fibrillation COPD, carotid stenosis s/p CEA, CAD s/p CABG, diabetes mellitus, CKD 4, GI bleed, cryptogenic cirrhosis, and severe pulmonary hypertension who presented with worsening dyspnea.      Assessment   1. Acute on chronic combined biventricular failure: Likely end stage 2. Pulmonary HTN: Looks like primarily pulmonary venous hypertension.  3. Severe COPD 4. AKI on CKD IV 5. Hx of hepatic encephalopathy and cryptogenic cirrhosis 6. Chronic atrial fibrillation.   Plan    PICC placed and started on milrinone with elevated filling pressures, low output, and marked volume overload.      Better UOP but still markedly volume overloaded with CVP > 20. Continue current Lasix IV + metolazone + milrinone.  Co-ox adequate at 65%.  BUN/creatinine slightly lower today.    BP high, can add hydralazine/nitrates.   On levaquin 5 doses for COPD exacerbation.  On prednisone and  nebs, still wheezing/rhonchorous.   Continue lactulose for hepatic encephalopathy. Ammonia 42 yesterday.   Chronic atrial fibrillation but HR trending up, 110s-120s, got up to 150s overnight.  Will use amiodarone gtt for now for rate control.   Prognosis very tenuous. 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  Agree with palliative care involvement (consulted).    Signed, Loralie Champagne, MD  06/16/2016, 7:48 AM   Advanced Heart Failure Team Pager [phone] (M-F; 7a - 4p)  Please contact [facility] for  night-coverage after hours (4p -7a ) and weekends on [email or web address]

## 2016-06-16 NOTE — Progress Notes (Signed)
Md Re-paged about pt's Hr 150-s.  Pt asymptomatic.  Had pt to bear down.  No results.  New orders received.  Will give 80 lasix and lopressor 5 iv.    Will repeat lopressor if needed for hr greater than 150.  Will continue to monitor . Saunders Revel T

## 2016-06-16 NOTE — Progress Notes (Signed)
Physical Therapy Treatment Patient Details Name: Cassandra Bonilla MRN: [id number] DOB: [birth year] Today's Date: 06/16/2016    History of Present Illness 75 year old African-American female with past medical history of chronic atrial fibrillation on Coumadin, chronic systolic heart failure with EF 45%, chronic anemia, CKD IV-V, HTN, HLD, DM, carotid artery stenosis s/p CEA and CAD s/p 4v CABG 1993 presenting with 5 day history of worsening cough wheezing and shortness of breath; +acute on chronic CHF and COPD    PT Comments    Patient more alert today, and impulsive/restless.  Required +2 assist for mobility.  Recommend ST-SNF at d/c for continued therapy.  Follow Up Recommendations  SNF;Supervision/Assistance - 24 hour     Equipment Recommendations  None recommended by PT    Recommendations for Other Services OT consult     Precautions / Restrictions Precautions Precautions: Fall Restrictions Weight Bearing Restrictions: No    Mobility  Bed Mobility Overal bed mobility: Needs Assistance Bed Mobility: Supine to Sit;Sit to Supine     Supine to sit: Mod assist;HOB elevated Sit to supine: Mod assist;+2 for physical assistance   General bed mobility comments: Assist to bring trunk to upright sitting position.  Once upright, able to maintain sitting balance EOB.  Required +2 assist to return to supine and position in bed.  Transfers Overall transfer level: Needs assistance Equipment used: Rolling walker (2 wheeled);2 person hand held assist Transfers: Sit to/from [facility] Sit to Stand: Mod assist;+2 physical assistance Stand pivot transfers: Min assist;+2 physical assistance       General transfer comment: Verbal cues for hand placement.  Required +2 assist to power up to stance from bed and BSC.  Required +2 min assist to pivot bed to [facility] with no assistive device.  Physical assist at hips to complete pivot.  Ambulation/Gait Ambulation/Gait assistance: Min  guard;+2 physical assistance Ambulation Distance (Feet): 3 Feet Assistive device: Rolling walker (2 wheeled) Gait Pattern/deviations: Step-through pattern;Decreased step length - right;Decreased step length - left;Shuffle Gait velocity: decr Gait velocity interpretation: Below normal speed for age/gender General Gait Details: Verbal cues for safe use of RW.  Required assist for balance and safety.  Patient with significant posterior lean.   Stairs            Wheelchair Mobility    Modified Rankin (Stroke Patients Only)       Balance Overall balance assessment: Needs assistance         Standing balance support: Bilateral upper extremity supported Standing balance-Leahy Scale: Poor Standing balance comment: Posterior lean in stance                    Cognition Arousal/Alertness: Awake/alert Behavior During Therapy: Restless;Impulsive Overall Cognitive Status: No family/caregiver present to determine baseline cognitive functioning Area of Impairment: Memory;Attention;Following commands;Safety/judgement;Problem solving   Current Attention Level: Sustained Memory: Decreased short-term memory Following Commands: Follows one step commands inconsistently;Follows one step commands with increased time Safety/Judgement: Decreased awareness of safety;Decreased awareness of deficits   Problem Solving: Slow processing;Difficulty sequencing;Requires verbal cues General Comments: Patient impulsive, with LE's off of bed while lying down as PT entered room.    Exercises      General Comments        Pertinent Vitals/Pain Pain Assessment: No/denies pain    Home Living                      Prior Function            PT  Goals (current goals can now be found in the care plan section) Progress towards PT goals: Progressing toward goals    Frequency    Min 3X/week      PT Plan Discharge plan needs to be updated    Co-evaluation              End of Session Equipment Utilized During Treatment: Gait belt Activity Tolerance: Patient tolerated treatment well Patient left: in bed;with call bell/phone within reach;with family/visitor present (Unable to set bed alarm. RN notified. Husband in room)     Time: [id number] PT Time Calculation (min) (ACUTE ONLY): 16 min  Charges:  $Therapeutic Activity: 8-22 mins                    G Codes:      Despina Pole 16-Jul-2016, 1:15 PM Carita Pian. Sanjuana Kava, [place] Pager [phone]

## 2016-06-16 NOTE — Progress Notes (Signed)
MD paged. Pt complaining of itching and atarax not due to give. MD ordered one time dose of PO benadryl. Will continue to monitor

## 2016-06-16 NOTE — Progress Notes (Signed)
PROGRESS NOTE    Cassandra Bonilla  [id number] DOB: [birth year] DOA: 06/11/2016 PCP: Marco Collie, MD      Brief Narrative:  Cassandra Bonilla is a 75 y.o. female with medical history significant of CAD, COPD, CAD, DM, gout, HTN, presenting with 5 day history of worsening cough wheezing and shortness of breath. Patient reports one day ago saying her cardiologist to switch her from furosemide to torsemide due to concern for volume overload. No increased diuresis after changing to torsemide. Associated with generalized weakness and increased respiratory effort. Cough is occasionally productive. No home inhalers. Patient was admitted for respiratory distress due to COPD exacerbation and CHF exacerbation.   Assessment & Plan:   Principal Problem:   Acute on chronic combined systolic and diastolic heart failure (HCC) Active Problems:   Chronic atrial fibrillation (HCC)   Type 2 diabetes mellitus with stage 4 chronic kidney disease, with long-term current use of insulin (HCC)   CKD (chronic kidney disease), stage IV (HCC)   Encephalopathy, hepatic (HCC)   Cryptogenic cirrhosis of liver (HCC)   COPD exacerbation (HCC)   Hyperkalemia   Respiratory distress   Gout   GERD (gastroesophageal reflux disease)   Essential hypertension   Acute on chronic combined systolic and diastolic CHF exacerbation and pHTN  -BNP on admission 4262  -Daily weights, Strict I/Os  -S/p RHC 9/28: "Severely elevated biventricular pressures with moderate pulmonary venous HTN and evidence of right heart failure"  -Appreciate cardiology, heart failure team -Diuresis and optimization with lasix, metolazone, milrinone  -Consulted palliative care. I did discuss with patient and patient's husband, Cassandra Bonilla, regarding patient's poor prognosis and end stage cardiomyopathy. He seemed to understand and agreed to meet with palliative care.   COPD exacerbation -Levaquin for total 10 days to cover bronchitis  -Duonebs  -Switch steroids  back to IV   -Added tessalon pearls and guaifenesine/codeine for cough   Elevated troponin -Likely secondary to CHF exacerbation as well as decreased renal function -No chest pain   -No ST changes on admission EKG    Chronic atrial fibrillation  -Recently admitted for GI bleed, coumadin held  -CHA2DS2-VASc 6 -Continue metoprolol  -Amiodarone gtt added   DM type 2, well controlled, with complication including CKD stage 4-5  -9/8 hemoglobin A1c= 7.4 -Increase lantus 8 units QAM -Increase SSI scale to resistant   CKD stage 4-5  -Cr fluctuates between 2.5-3.5 at baseline  -Monitor BMP  -[facility]  Hepatic encephalopathy and cryptogenic cirrhosis: previously admitted with hepatic encephalopathy  -Continue Lactulose, Xifaxan -Follows [facility]   Gout -Continue allopurinol  HTN -Continue metoprolol, hydralazine/imdur   GERD: -Continue protonix   DVT prophylaxis: subq hep  Code Status: Full Family Communication: spoke with husband today  Disposition Plan: Continue diuresis and management of this patient's heart failure and pulm HTN. Consulted palliative care. I've added cough medications and increased lantus and SSI for hyperglycemia.   Consultants:   Cardiology   Advanced heart failure   Procedures:   Right heart cath 9/28   Antimicrobials:  Levaquin 9/26 >> total 10 day course    Subjective: Patient denies any complaints today. She denies any chest pain, shortness of breath, nausea, vomiting.   Objective: Vitals:   06/16/16 0323 06/16/16 0400 06/16/16 0800 06/16/16 0904  BP: (!) 155/91 (!) 157/94 (!) 140/94   Pulse: (!) 107 (!) 111 (!) 108   Resp: 15 15 17    Temp: 98.7 F (37.1 C)  97.6 F (36.4  C)   TempSrc: Oral  Oral   SpO2: 99% 97% 99% 98%  Weight: 70.8 kg (156 lb 1.4 oz)     Height:        Intake/Output Summary (Last 24 hours) at 06/16/16 1017 Last data filed at 06/16/16 0815   Gross per 24 hour  Intake           1429.6 ml  Output             2700 ml  Net          -1270.4 ml   Filed Weights   06/14/16 0500 06/15/16 0320 06/16/16 0323  Weight: 76.1 kg (167 lb 12.3 oz) 72.3 kg (159 lb 6.3 oz) 70.8 kg (156 lb 1.4 oz)    Examination:  General exam: Appears calm and comfortable  Respiratory system: Decreased inspiratory effort. +diffuse expiratory rhonchi today. Respiratory effort normal. Cardiovascular system: S1 & S2 heard, irregular rhythm, tachycardic Gastrointestinal system: Abdomen is nondistended, soft and nontender. No organomegaly or masses felt. Normal bowel sounds heard. Central nervous system: Alert and oriented to self, place, time.  Extremities: Symmetric Skin: No rashes, lesions or ulcers Psychiatry: Judgement and insight appear poor  Data Reviewed: I have personally reviewed following labs and imaging studies  CBC:  Recent Labs Lab 06/10/16 1305  06/12/16 0341 06/13/16 0708 06/14/16 0500 06/15/16 0320 06/16/16 0335  WBC 6.4  < > 3.4* 3.1* 4.1 6.3 4.4  NEUTROABS 4,480  --   --  2.7 3.6 5.8 4.1  HGB 9.5*  < > 9.5* 8.9* 8.2* 8.3* 8.4*  HCT 32.3*  < > 32.7* 30.0* 27.1* 27.1* 28.2*  MCV 86.8  < > 86.5 84.7 83.4 83.6 82.5  PLT 244  < > 274 242 214 200 193  < > = values in this interval not displayed. Basic Metabolic Panel:  Recent Labs Lab 06/11/16 1805 06/12/16 0341 06/13/16 0708 06/14/16 0500 06/15/16 0320 06/16/16 0335  NA  --  136 132* 135 134* 134*  K  --  5.7* 5.3* 4.2 3.8 3.8  CL  --  104 103 105 104 101  CO2  --  16* 16* 20* 20* 21*  GLUCOSE  --  322* 267* 182* 260* 257*  BUN  --  84* 95* 95* 93* 97*  CREATININE 3.59* 3.65* 3.53* 3.48* 3.30* 3.21*  CALCIUM  --  8.3* 8.0* 8.1* 8.2* 8.6*  MG 1.9  --   --   --   --   --   PHOS 5.5*  --   --   --   --   --    GFR: Estimated Creatinine Clearance: 14 mL/min (by C-G formula based on SCr of 3.21 mg/dL (H)). Liver Function Tests:  Recent Labs Lab 06/11/16 1148  06/12/16 0341  AST 26 26  ALT 18 18  ALKPHOS 178* 184*  BILITOT 1.0 1.4*  PROT 7.0 7.0  ALBUMIN 2.8* 2.8*   No results for input(s): LIPASE, AMYLASE in the last 168 hours.  Recent Labs Lab 06/11/16 1821 06/14/16 1250  AMMONIA 64* 42*   Coagulation Profile:  Recent Labs Lab 06/11/16 1148  INR 1.51   Cardiac Enzymes:  Recent Labs Lab 06/11/16 1805  TROPONINI 0.05*   BNP (last 3 results) No results for input(s): PROBNP in the last 8760 hours. HbA1C: No results for input(s): HGBA1C in the last 72 hours. CBG:  Recent Labs Lab 06/15/16 0713 06/15/16 1130 06/15/16 1604 06/15/16 2123 06/16/16 0803  GLUCAP 231* 253* 311* 345* 302*  Lipid Profile: No results for input(s): CHOL, HDL, LDLCALC, TRIG, CHOLHDL, LDLDIRECT in the last 72 hours. Thyroid Function Tests: No results for input(s): TSH, T4TOTAL, FREET4, T3FREE, THYROIDAB in the last 72 hours. Anemia Panel: No results for input(s): VITAMINB12, FOLATE, FERRITIN, TIBC, IRON, RETICCTPCT in the last 72 hours. Sepsis Labs:  Recent Labs Lab 06/11/16 1805 06/13/16 0708  PROCALCITON 0.43 0.34    Recent Results (from the past 240 hour(s))  MRSA PCR Screening     Status: None   Collection Time: 06/13/16  1:50 PM  Result Value Ref Range Status   MRSA by PCR NEGATIVE NEGATIVE Final    Comment:        The GeneXpert MRSA Assay (FDA approved for NASAL specimens only), is one component of a comprehensive MRSA colonization surveillance program. It is not intended to diagnose MRSA infection nor to guide or monitor treatment for MRSA infections.          Radiology Studies: No results found.      Scheduled Meds: . allopurinol  100 mg Oral Daily  . benzonatate  100 mg Oral TID  . cholestyramine light  4 g Oral Q12H  . furosemide  120 mg Intravenous Q8H  . heparin  5,000 Units Subcutaneous Q8H  . hydrALAZINE  12.5 mg Oral Q8H  . insulin aspart  0-15 Units Subcutaneous TID WC  . insulin aspart  0-5  Units Subcutaneous QHS  . insulin aspart  3 Units Subcutaneous TID WC  . insulin glargine  5 Units Subcutaneous BH-q7a  . ipratropium-albuterol  3 mL Nebulization TID  . isosorbide mononitrate  30 mg Oral Daily  . lactulose  30 g Oral TID  . levofloxacin  500 mg Oral Q48H  . methylPREDNISolone (SOLU-MEDROL) injection  60 mg Intravenous Q12H  . metolazone  5 mg Oral BID  . pantoprazole  40 mg Oral Daily  . rifaximin  400 mg Oral TID  . sodium chloride flush  10-40 mL Intracatheter Q12H  . sodium chloride flush  3 mL Intravenous Q12H   Continuous Infusions: . amiodarone 60 mg/hr (06/16/16 0815)  . amiodarone    . milrinone 0.25 mcg/kg/min (06/16/16 0527)     LOS: 5 days    Time spent: 30 minutes    Dessa Phi, DO [facility] Pager [phone]  If 7PM-7AM, please contact night-coverage [email or web address] Password [email or web address] 06/16/2016, 10:17 AM

## 2016-06-17 DIAGNOSIS — Z515 Encounter for palliative care: Secondary | ICD-10-CM

## 2016-06-17 DIAGNOSIS — N184 Chronic kidney disease, stage 4 (severe): Secondary | ICD-10-CM

## 2016-06-17 LAB — CARBOXYHEMOGLOBIN
CARBOXYHEMOGLOBIN: 2 % — AB (ref 0.5–1.5)
Methemoglobin: 0.3 % (ref 0.0–1.5)
O2 SAT: 70.2 %
TOTAL HEMOGLOBIN: 8.6 g/dL — AB (ref 12.0–16.0)

## 2016-06-17 LAB — BASIC METABOLIC PANEL
ANION GAP: 15 (ref 5–15)
BUN: 97 mg/dL — ABNORMAL HIGH (ref 6–20)
CALCIUM: 8.7 mg/dL — AB (ref 8.9–10.3)
CO2: 19 mmol/L — AB (ref 22–32)
CREATININE: 3.16 mg/dL — AB (ref 0.44–1.00)
Chloride: 99 mmol/L — ABNORMAL LOW (ref 101–111)
GFR, EST AFRICAN AMERICAN: 15 mL/min — AB (ref >60)
GFR, EST NON AFRICAN AMERICAN: 13 mL/min — AB (ref >60)
Glucose, Bld: 149 mg/dL — ABNORMAL HIGH (ref 65–99)
Potassium: 3.3 mmol/L — ABNORMAL LOW (ref 3.5–5.1)
SODIUM: 133 mmol/L — AB (ref 135–145)

## 2016-06-17 LAB — GLUCOSE, CAPILLARY
GLUCOSE-CAPILLARY: 215 mg/dL — AB (ref 65–99)
Glucose-Capillary: 255 mg/dL — ABNORMAL HIGH (ref 65–99)
Glucose-Capillary: 220 mg/dL — ABNORMAL HIGH (ref 65–99)
Glucose-Capillary: 182 mg/dL — ABNORMAL HIGH (ref 65–99)

## 2016-06-17 LAB — CBC WITH DIFFERENTIAL/PLATELET
BASOS PCT: 0 %
Basophils Absolute: 0 10*3/uL (ref 0.0–0.1)
EOS ABS: 0 10*3/uL (ref 0.0–0.7)
EOS PCT: 0 %
HCT: 28.4 % — ABNORMAL LOW (ref 36.0–46.0)
Hemoglobin: 8.6 g/dL — ABNORMAL LOW (ref 12.0–15.0)
LYMPHS ABS: 0.3 10*3/uL — AB (ref 0.7–4.0)
Lymphocytes Relative: 5 %
MCH: 24.7 pg — AB (ref 26.0–34.0)
MCHC: 30.3 g/dL (ref 30.0–36.0)
MCV: 81.6 fL (ref 78.0–100.0)
MONO ABS: 0.4 10*3/uL (ref 0.1–1.0)
MONOS PCT: 6 %
NEUTROS PCT: 89 %
Neutro Abs: 5.2 10*3/uL (ref 1.7–7.7)
PLATELETS: 191 10*3/uL (ref 150–400)
RBC: 3.48 MIL/uL — AB (ref 3.87–5.11)
RDW: 17.8 % — ABNORMAL HIGH (ref 11.5–15.5)
WBC: 5.8 10*3/uL (ref 4.0–10.5)

## 2016-06-17 MED ORDER — DIPHENHYDRAMINE HCL 25 MG PO CAPS
25.0000 mg | ORAL_CAPSULE | ORAL | Status: DC | PRN
  Administered 2016-06-17 – 2016-06-26 (×11): 25 mg via ORAL
  Filled 2016-06-17 (×12): qty 1

## 2016-06-17 MED ORDER — POTASSIUM CHLORIDE CRYS ER 20 MEQ PO TBCR: 40.0000 meq | EXTENDED_RELEASE_TABLET | Freq: Once | ORAL | Status: AC

## 2016-06-17 MED ORDER — POTASSIUM CHLORIDE CRYS ER 20 MEQ PO TBCR
40.0000 meq | EXTENDED_RELEASE_TABLET | Freq: Once | ORAL | Status: AC
  Administered 2016-06-17: 40 meq via ORAL
  Filled 2016-06-17: qty 2

## 2016-06-17 MED ORDER — FUROSEMIDE 10 MG/ML IJ SOLN
15.0000 mg/h | INTRAMUSCULAR | Status: DC
  Administered 2016-06-17 – 2016-06-18 (×3): 20 mg/h via INTRAVENOUS
  Administered 2016-06-18 – 2016-06-22 (×10): 30 mg/h via INTRAVENOUS
  Administered 2016-06-22 – 2016-06-23 (×2): 15 mg/h via INTRAVENOUS
  Filled 2016-06-17 (×30): qty 25

## 2016-06-17 MED ORDER — CAMPHOR-MENTHOL 0.5-0.5 % EX LOTN
TOPICAL_LOTION | CUTANEOUS | Status: DC | PRN
  Administered 2016-06-22 (×2): 1 via TOPICAL
  Filled 2016-06-17: qty 222

## 2016-06-17 NOTE — Progress Notes (Signed)
Sarna lotion ordered and applied for itching. Pt states relief when used. PRN benadryl and Vistaril given today for itching as well.   Pt has become more confused throughout the day.  She can recall her first name, but not her last name.  She does know she is in the hospital, but she thinks she is here because she "has more work to do".   Pt hands began to tremor and shake and this mildly progressed throughout the day as well.   Andreas Blower, RN

## 2016-06-17 NOTE — NC FL2 (Signed)
[place] LEVEL OF CARE SCREENING TOOL     IDENTIFICATION  Patient Name: Cassandra Bonilla Birthdate: 12-13-1940 Sex: female Admission Date (Current Location): 06/11/2016  [facility] and [place] Number:  [employer] and Address:  [facility] [place] [place] [address], [place], [place] [address]      Provider Number: [id number]  Attending Physician Name and Address:  Shon Millet*  Relative Name and Phone Number:       Current Level of Care: Hospital Recommended Level of Care: [place] Prior Approval Number:    Date Approved/Denied:   PASRR Number: [id number]  Discharge Plan: SNF    Current Diagnoses: Patient Active Problem List   Diagnosis Date Noted  . Gout 06/12/2016  . GERD (gastroesophageal reflux disease) 06/12/2016  . Essential hypertension 06/12/2016  . COPD exacerbation ([place]) 06/11/2016  . Hyperkalemia 06/11/2016  . Respiratory distress 06/11/2016  . Acute on chronic combined systolic and diastolic heart failure ([place]) 06/11/2016  . Cryptogenic cirrhosis of liver ([place])   . Pulmonary hypertension   . GIB (gastrointestinal bleeding) 05/24/2016  . Encephalopathy, hepatic ([place]) 05/24/2016  . GI bleed 05/24/2016  . Abnormal alkaline phosphatase test   . Acute encephalopathy 04/22/2016  . Pruritus 03/22/2016  . Anemia of chronic disease 03/22/2016  . Chronic atrial fibrillation ([place]) 03/22/2016  . Type 2 diabetes mellitus with stage 4 chronic kidney disease, with long-term current use of insulin ([place]) 03/22/2016  . CKD (chronic kidney disease), stage IV ([place]) 03/22/2016    Orientation RESPIRATION BLADDER Height & Weight     Self, Situation, Place  Normal Continent, Indwelling catheter (Urinary catheter) Weight: 70.1 kg (154 lb 8.7 oz) Height:  5\' 2"  (157.5 cm)  BEHAVIORAL SYMPTOMS/MOOD NEUROLOGICAL BOWEL NUTRITION STATUS      Continent Diet (Please see DC Summary)  AMBULATORY STATUS COMMUNICATION OF  NEEDS Skin   Extensive Assist Verbally Normal                       Personal Care Assistance Level of Assistance  Bathing, Feeding, Dressing Bathing Assistance: Limited assistance Feeding assistance: Independent Dressing Assistance: Limited assistance     Functional Limitations Info  Sight Sight Info: Impaired        SPECIAL CARE FACTORS FREQUENCY  PT (By licensed PT)     PT Frequency: 5x/week              Contractures Contractures Info: Not present    Additional Factors Info  Code Status, Allergies Code Status Info: Full Allergies Info: Contrast Media Iodinated Diagnostic Agents, Prednisone, Lactose Intolerance (Gi)           Current Medications (06/17/2016):  This is the current hospital active medication list Current Facility-Administered Medications  Medication Dose Route Frequency Provider Last Rate Last Dose  . 0.9 %  sodium chloride infusion  250 mL Intravenous PRN Jolaine Artist, MD      . 0.9 %  sodium chloride infusion  250 mL Intravenous PRN Jolaine Artist, MD      . acetaminophen (TYLENOL) tablet 650 mg  650 mg Oral Q4H PRN Jolaine Artist, MD      . albuterol (PROVENTIL) (2.5 MG/3ML) 0.083% nebulizer solution 2.5 mg  2.5 mg Nebulization Q2H PRN Waldemar Dickens, MD      . allopurinol (ZYLOPRIM) tablet 100 mg  100 mg Oral Daily Shon Millet, DO   100 mg at 06/16/16 1032  . amiodarone (NEXTERONE  PREMIX) 360-4.14 MG/200ML-% (1.8 mg/mL) IV infusion  30 mg/hr Intravenous Continuous Larey Dresser, MD 16.7 mL/hr at 06/17/16 0000 30 mg/hr at 06/17/16 0000  . benzonatate (TESSALON) capsule 100 mg  100 mg Oral TID Shon Millet, DO   100 mg at 06/16/16 2127  . cholestyramine light (PREVALITE) packet 4 g  4 g Oral Q12H Shon Millet, DO   4 g at 06/17/16 0323  . diphenhydrAMINE (BENADRYL) capsule 25 mg  25 mg Oral Q4H PRN Rhetta Mura Schorr, NP      . furosemide (LASIX) 250 mg in dextrose 5 % 250 mL (1 mg/mL)  infusion  20 mg/hr Intravenous Continuous Amy D Clegg, NP 20 mL/hr at 06/17/16 0837 20 mg/hr at 06/17/16 0837  . guaiFENesin-codeine 100-10 MG/5ML solution 5 mL  5 mL Oral Q4H PRN Shon Millet, DO   5 mL at 06/17/16 0604  . heparin injection 5,000 Units  5,000 Units Subcutaneous Q8H Waldemar Dickens, MD   5,000 Units at 06/17/16 0511  . hydrALAZINE (APRESOLINE) tablet 12.5 mg  12.5 mg Oral Q8H Larey Dresser, MD   12.5 mg at 06/17/16 0511  . hydrOXYzine (ATARAX/VISTARIL) tablet 25-50 mg  25-50 mg Oral Q6H PRN Waldemar Dickens, MD   50 mg at 06/16/16 2338  . insulin aspart (novoLOG) injection 0-20 Units  0-20 Units Subcutaneous TID [facility] Shon Millet, DO   7 Units at 06/17/16 [phone]  . insulin aspart (novoLOG) injection 0-5 Units  0-5 Units Subcutaneous QHS Rich Fuchs Choi, DO      . insulin aspart (novoLOG) injection 3 Units  3 Units Subcutaneous TID [facility] Shon Millet, DO   3 Units at 06/17/16 [phone]  . insulin glargine (LANTUS) injection 8 Units  8 Units Subcutaneous [address] [place], [place]   8 Units at 06/17/16 [id number]  . ipratropium-albuterol (DUONEB) 0.5-2.5 (3) MG/3ML nebulizer solution 3 mL  3 mL Nebulization TID Shon Millet, DO   3 mL at 06/16/16 1945  . isosorbide mononitrate (IMDUR) 24 hr tablet 30 mg  30 mg Oral Daily Larey Dresser, MD   30 mg at 06/16/16 1031  . lactulose (CHRONULAC) 10 GM/15ML solution 30 g  30 g Oral TID Waldemar Dickens, MD   30 g at 06/16/16 2127  . levofloxacin (LEVAQUIN) tablet 500 mg  500 mg Oral Q48H Shon Millet, DO   500 mg at 06/15/16 0907  . methylPREDNISolone sodium succinate (SOLU-MEDROL) 125 mg/2 mL injection 60 mg  60 mg Intravenous Q12H Jennifer Chahn-Yang Choi, DO   60 mg at 06/16/16 2351  . metolazone (ZAROXOLYN) tablet 5 mg  5 mg Oral BID Jolaine Artist, MD   5 mg at 06/17/16 0835  . metoprolol (LOPRESSOR) injection 5 mg  5 mg Intravenous Q5 min PRN Flossie Dibble, MD   5 mg at  06/16/16 0035  . milrinone (PRIMACOR) 20 MG/100 ML (0.2 mg/mL) infusion  0.25 mcg/kg/min Intravenous Continuous Jolaine Artist, MD 5.7 mL/hr at 06/16/16 2043 0.25 mcg/kg/min at 06/16/16 2043  . ondansetron (ZOFRAN) tablet 4 mg  4 mg Oral Q6H PRN Waldemar Dickens, MD       Or  . ondansetron [facility]) injection 4 mg  4 mg Intravenous Q6H PRN Waldemar Dickens, MD      . pantoprazole (PROTONIX) EC tablet 40 mg  40 mg Oral Daily Waldemar Dickens, MD   40 mg at 06/16/16 1032  . potassium chloride SA (K-DUR,KLOR-CON)  CR tablet 40 mEq  40 mEq Oral Once [employer], DO      . rifaximin Doreene Nest) tablet 400 mg  400 mg Oral TID Waldemar Dickens, MD   400 mg at 06/16/16 2128  . sodium chloride flush (NS) 0.9 % injection 10-40 mL  10-40 mL Intracatheter Q12H Shon Millet, DO   10 mL at 06/16/16 2132  . sodium chloride flush (NS) 0.9 % injection 10-40 mL  10-40 mL Intracatheter PRN Shon Millet, DO      . sodium chloride flush (NS) 0.9 % injection 3 mL  3 mL Intravenous Q12H Jolaine Artist, MD   3 mL at 06/16/16 2132  . sodium chloride flush (NS) 0.9 % injection 3 mL  3 mL Intravenous PRN Jolaine Artist, MD         Discharge Medications: Please see discharge summary for a list of discharge medications.  Relevant Imaging Results:  Relevant Lab Results:   Additional Information SS#: [id number]  Benard Halsted, LCSWA

## 2016-06-17 NOTE — Progress Notes (Addendum)
Patient ID: Cassandra Bonilla, female   DOB: Apr 08, 1941, 75 y.o.   MRN: [id number]   Patient Name: Cassandra Bonilla Date of Encounter: 06/17/2016  Primary Cardiologist: Dr. Oval Linsey    Subjective   Chronically ill woman with severe COPD and mixed systolic/diastolic HF and pulmonary HTN. Underwent RHC which showed severely elevated biventricular pressures with low cardiac output and RV strain. Moved to SDU. PICC placed and milrinone started. Increased lasix.   Co-ox 70% this am on milrinone 0.25 mcg/kg/min. CVP > 25.  On milrinone 0.25 mcg + amio drip at 30 mg per hour. Weight down a couple of pounds. Negative 1.1 liters.   Denies SOB   RHC 06/13/16  RA = 30 RV = 68/26 PA = 69/25 (43) PCW = 33 Fick cardiac output/index = 3.7/2.1 Thermo CO/CI = 4.0/2.3 PVR = 2.5 WU Ao sat = 99% PA sat = 45%, 46%  Echo (9/17) with EF 40-45%, PASP 75, RV mildly dilated/mild-moderately decreased systolic function, severe TR.   Inpatient Medications    Scheduled Meds: . allopurinol  100 mg Oral Daily  . benzonatate  100 mg Oral TID  . cholestyramine light  4 g Oral Q12H  . furosemide  120 mg Intravenous Q8H  . heparin  5,000 Units Subcutaneous Q8H  . hydrALAZINE  12.5 mg Oral Q8H  . insulin aspart  0-20 Units Subcutaneous TID WC  . insulin aspart  0-5 Units Subcutaneous QHS  . insulin aspart  3 Units Subcutaneous TID WC  . insulin glargine  8 Units Subcutaneous BH-q7a  . ipratropium-albuterol  3 mL Nebulization TID  . isosorbide mononitrate  30 mg Oral Daily  . lactulose  30 g Oral TID  . levofloxacin  500 mg Oral Q48H  . methylPREDNISolone (SOLU-MEDROL) injection  60 mg Intravenous Q12H  . metolazone  5 mg Oral BID  . pantoprazole  40 mg Oral Daily  . potassium chloride  40 mEq Oral Once  . rifaximin  400 mg Oral TID  . sodium chloride flush  10-40 mL Intracatheter Q12H  . sodium chloride flush  3 mL Intravenous Q12H   Continuous Infusions: . amiodarone 30 mg/hr (06/17/16 0000)  . milrinone  0.25 mcg/kg/min (06/16/16 2043)   PRN Meds:.sodium chloride, sodium chloride, acetaminophen, albuterol, diphenhydrAMINE, guaiFENesin-codeine, hydrOXYzine, metoprolol, ondansetron **OR** ondansetron (ZOFRAN) IV, sodium chloride flush, sodium chloride flush   Vital Signs    Vitals:   06/17/16 0305 06/17/16 0400 06/17/16 0450 06/17/16 0600  BP: 132/66 (!) 134/117 (!) 130/54 99/74  Pulse: (!) 103 (!) 102 100 (!) 110  Resp: 19 13 (!) 21 20  Temp:   97.5 F (36.4 C)   TempSrc:   Oral   SpO2: 100% 96% 100% 98%  Weight:  154 lb 8.7 oz (70.1 kg)    Height:        Intake/Output Summary (Last 24 hours) at 06/17/16 0751 Last data filed at 06/17/16 0600  Gross per 24 hour  Intake          1660.11 ml  Output             2775 ml  Net         -1114.89 ml   Filed Weights   06/15/16 0320 06/16/16 0323 06/17/16 0400  Weight: 159 lb 6.3 oz (72.3 kg) 156 lb 1.4 oz (70.8 kg) 154 lb 8.7 oz (70.1 kg)    Physical Exam  CVP 25 GEN: Elderly and chronically ill appearing.   HEENT: Normal Neck: Supple, JVP to jaw,  carotid bruits, or masses. Cardiac: RRR, no murmurs, rubs, or gallops. No clubbing, cyanosis, edema.  Radials/DP/PT 2+ and equal bilaterally.  Respiratory:  Wheezing/rhonchi throughout.  GI: Soft, NT, ND, no HSM. No bruits or masses. +BS  MS: no deformity or atrophy. RUE PICC  Skin: warm and dry, no rash. Neuro:  Strength and sensation are intact. Psych: Alert to person.  Flat affect.  Labs    CBC  Recent Labs  06/16/16 0335 06/17/16 0300  WBC 4.4 5.8  NEUTROABS 4.1 5.2  HGB 8.4* 8.6*  HCT 28.2* 28.4*  MCV 82.5 81.6  PLT 193 [id number]   Basic Metabolic Panel  Recent Labs  06/16/16 0335 06/17/16 0300  NA 134* 133*  K 3.8 3.3*  CL 101 99*  CO2 21* 19*  GLUCOSE 257* 149*  BUN 97* 97*  CREATININE 3.21* 3.16*  CALCIUM 8.6* 8.7*   Liver Function Tests No results for input(s): AST, ALT, ALKPHOS, BILITOT, PROT, ALBUMIN in the last 72 hours. Cardiac Enzymes No results for  input(s): CKTOTAL, CKMB, CKMBINDEX, TROPONINI in the last 72 hours.  Telemetry     atrial fibrillation- Personally Reviewed   Radiology    No results found.   Cardiac Studies  RHC pending   Transthoracic Echocardiography 05/26/16 Study Conclusions  - Left ventricle: The cavity size was normal. Wall thickness was   increased in a pattern of mild LVH. Systolic function was mildly   to moderately reduced. The estimated ejection fraction was in the   range of 40% to 45%. - Ventricular septum: The contour showed diastolic flattening and   systolic flattening. - Aortic valve: Severely calcified annulus. - Mitral valve: There was mild to moderate regurgitation. - Left atrium: The atrium was moderately dilated. - Right ventricle: The cavity size was mildly dilated. Systolic   function was mildly to moderately reduced. - Right atrium: The atrium was mildly dilated. - Tricuspid valve: There was severe regurgitation. - Pulmonary arteries: Systolic pressure was severely increased. PA   peak pressure: 75 mm Hg (S).  Patient Profile  75 yo female with PMH of chronic systolic and diastolic heart failure (LVEF 40-45%), chronic atrial fibrillation COPD, carotid stenosis s/p CEA, CAD s/p CABG, diabetes mellitus, CKD 4, GI bleed, cryptogenic cirrhosis, and severe pulmonary hypertension who presented with worsening dyspnea.      Assessment   1. Acute on chronic combined biventricular failure: Likely end stage 2. Pulmonary HTN: Looks like primarily pulmonary venous hypertension.  3. Severe COPD 4. AKI on CKD IV 5. Hx of hepatic encephalopathy and cryptogenic cirrhosis 6. Chronic atrial fibrillation.   Plan    PICC placed and started on milrinone with elevated filling pressures, low output, and marked volume overload.     Marked volume overload despite high dose lasix + metolazone. Stop intermittent lasix and start lasix drip 20 mg per hour. Supplement K. Give 40 meq potassium x1.   BP  low this morning. Continue current dose of hydralazine/nitrates.   On levaquin 2/5 doses for COPD exacerbation.  On prednisone and nebs, still wheezing/rhonchorous.   Continue lactulose for hepatic encephalopathy. Most recent Ammonia 42. .   Chronic atrial fibrillation but HR better controlled 90s.  Will use amiodarone gtt for now for rate control.    Prognosis very tenuous. 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  Agree with palliative care involvement (consulted).    OOB today.   SignedDarrick Grinder, NP-C 06/17/2016,  7:51 AM   Advanced Heart Failure Team Pager [phone] (M-F; 7a - 4p)  Please contact [facility] for night-coverage after hours (4p -7a ) and weekends on [email or web address]  Patient seen and examined with Darrick Grinder, NP. We discussed all aspects of the encounter. I agree with the assessment and plan as stated above.   CVP remains very high despite high dose lasix and milrinone support. Co-ox ok. Will change to lasix gtt at 20. Renal function improved slightly. Will continue amio gtt for rate control.   Overall prognosis very tenuous. Palliative care consult pending.   Yamel Bale,MD 9:46 AM

## 2016-06-17 NOTE — Progress Notes (Signed)
PROGRESS NOTE    Cassandra Bonilla  [id number] DOB: Aug 24, 1941 DOA: 06/11/2016 PCP: Marco Collie, MD      Brief Narrative:  Cassandra Bonilla is a 75 y.o. female with medical history significant of CAD, COPD, CAD, DM, gout, HTN, presenting with 5 day history of worsening cough wheezing and shortness of breath. Patient reports one day ago saying her cardiologist to switch her from furosemide to torsemide due to concern for volume overload. No increased diuresis after changing to torsemide. Associated with generalized weakness and increased respiratory effort. Cough is occasionally productive. No home inhalers. Patient was admitted for respiratory distress due to COPD exacerbation and CHF exacerbation.   Assessment & Plan:   Principal Problem:   Acute on chronic combined systolic and diastolic heart failure (HCC) Active Problems:   Chronic atrial fibrillation (HCC)   Type 2 diabetes mellitus with stage 4 chronic kidney disease, with long-term current use of insulin (HCC)   CKD (chronic kidney disease), stage IV (HCC)   Encephalopathy, hepatic (HCC)   Cryptogenic cirrhosis of liver (HCC)   COPD exacerbation (HCC)   Hyperkalemia   Respiratory distress   Gout   GERD (gastroesophageal reflux disease)   Essential hypertension   Acute on chronic combined systolic and diastolic CHF exacerbation and pHTN  -BNP on admission 4262  -Daily weights, Strict I/Os  -S/p RHC 9/28: "Severely elevated biventricular pressures with moderate pulmonary venous HTN and evidence of right heart failure"  -Appreciate cardiology, heart failure team -Diuresis and optimization with lasix, metolazone, milrinone  -Consulted palliative care  COPD exacerbation -Levaquin for total 10 days to cover bronchitis  -Duonebs  -Solumedrol  -Tessalon pearls and guaifenesine/codeine for cough   Elevated troponin -Likely secondary to CHF exacerbation as well as decreased renal function -No chest pain   -No ST changes on  admission EKG    Chronic atrial fibrillation  -Recently admitted for GI bleed, coumadin held  -CHA2DS2-VASc 6 -Continue metoprolol  -Amiodarone gtt added   DM type 2, well controlled, with complication including CKD stage 4-5  -9/8 hemoglobin A1c= 7.4 -Increase lantus 8 units QAM -Increase SSI scale to resistant   CKD stage 4-5  -Cr fluctuates between 2.5-3.5 at baseline  -Monitor BMP  -[facility]  Hepatic encephalopathy and cryptogenic cirrhosis: previously admitted with hepatic encephalopathy  -Continue Lactulose, Xifaxan -Follows [facility]   Gout -Continue allopurinol  HTN -Continue metoprolol, hydralazine/imdur   GERD: -Continue protonix   DVT prophylaxis: subq hep  Code Status: Full Family Communication: spoke with husband today  Disposition Plan: Continue diuresis and management of this patient's heart failure and pulm HTN. Consulted palliative care. Poor prognosis.   Consultants:   Cardiology   Advanced heart failure   Procedures:   Right heart cath 9/28   Antimicrobials:  Levaquin 9/26 >> total 10 day course    Subjective: Patient complained of itching on her arms today. Improved with benadryl and lotion. No other complaints. No worsening chest pain, SOB.   Objective: Vitals:   06/17/16 0600 06/17/16 0800 06/17/16 0943 06/17/16 1124  BP: 99/74 118/74 118/74 120/81  Pulse: (!) 110 97 (!) 104   Resp: 20 14  16   Temp:  97.7 F (36.5 C)  98.4 F (36.9 C)  TempSrc:  Oral  Oral  SpO2: 98% 100% 100% 100%  Weight:      Height:        Intake/Output Summary (Last 24 hours) at 06/17/16 1415 Last data filed at 06/17/16  1200  Gross per 24 hour  Intake          1413.58 ml  Output             2075 ml  Net          -661.42 ml   Filed Weights   06/15/16 0320 06/16/16 0323 06/17/16 0400  Weight: 72.3 kg (159 lb 6.3 oz) 70.8 kg (156 lb 1.4 oz) 70.1 kg (154 lb 8.7 oz)    Examination:    General exam: Appears calm and comfortable  Respiratory system: Decreased inspiratory effort. +rhonchi improved today. Respiratory effort normal. Cardiovascular system: S1 & S2 heard, irregular rhythm, tachycardic Gastrointestinal system: Abdomen is nondistended, soft and nontender. No organomegaly or masses felt. Normal bowel sounds heard. Central nervous system: Alert and oriented to self, place, time.  Extremities: Symmetric Skin: No rashes, lesions or ulcers Psychiatry: Judgement and insight appear poor  Data Reviewed: I have personally reviewed following labs and imaging studies  CBC:  Recent Labs Lab 06/13/16 0708 06/14/16 0500 06/15/16 0320 06/16/16 0335 06/17/16 0300  WBC 3.1* 4.1 6.3 4.4 5.8  NEUTROABS 2.7 3.6 5.8 4.1 5.2  HGB 8.9* 8.2* 8.3* 8.4* 8.6*  HCT 30.0* 27.1* 27.1* 28.2* 28.4*  MCV 84.7 83.4 83.6 82.5 81.6  PLT 242 214 200 193 [id number]   Basic Metabolic Panel:  Recent Labs Lab 06/11/16 1805  06/13/16 0708 06/14/16 0500 06/15/16 0320 06/16/16 0335 06/17/16 0300  NA  --   < > 132* 135 134* 134* 133*  K  --   < > 5.3* 4.2 3.8 3.8 3.3*  CL  --   < > 103 105 104 101 99*  CO2  --   < > 16* 20* 20* 21* 19*  GLUCOSE  --   < > 267* 182* 260* 257* 149*  BUN  --   < > 95* 95* 93* 97* 97*  CREATININE 3.59*  < > 3.53* 3.48* 3.30* 3.21* 3.16*  CALCIUM  --   < > 8.0* 8.1* 8.2* 8.6* 8.7*  MG 1.9  --   --   --   --   --   --   PHOS 5.5*  --   --   --   --   --   --   < > = values in this interval not displayed. GFR: Estimated Creatinine Clearance: 14.1 mL/min (by C-G formula based on SCr of 3.16 mg/dL (H)). Liver Function Tests:  Recent Labs Lab 06/11/16 1148 06/12/16 0341  AST 26 26  ALT 18 18  ALKPHOS 178* 184*  BILITOT 1.0 1.4*  PROT 7.0 7.0  ALBUMIN 2.8* 2.8*   No results for input(s): LIPASE, AMYLASE in the last 168 hours.  Recent Labs Lab 06/11/16 1821 06/14/16 1250  AMMONIA 64* 42*   Coagulation Profile:  Recent Labs Lab 06/11/16 1148   INR 1.51   Cardiac Enzymes:  Recent Labs Lab 06/11/16 1805  TROPONINI 0.05*   BNP (last 3 results) No results for input(s): PROBNP in the last 8760 hours. HbA1C: No results for input(s): HGBA1C in the last 72 hours. CBG:  Recent Labs Lab 06/16/16 1159 06/16/16 1709 06/16/16 2111 06/17/16 0810 06/17/16 1241  GLUCAP 302* 258* 164* 215* 255*   Lipid Profile: No results for input(s): CHOL, HDL, LDLCALC, TRIG, CHOLHDL, LDLDIRECT in the last 72 hours. Thyroid Function Tests: No results for input(s): TSH, T4TOTAL, FREET4, T3FREE, THYROIDAB in the last 72 hours. Anemia Panel: No results for input(s): VITAMINB12, FOLATE, FERRITIN, TIBC,  IRON, RETICCTPCT in the last 72 hours. Sepsis Labs:  Recent Labs Lab 06/11/16 1805 06/13/16 0708  PROCALCITON 0.43 0.34    Recent Results (from the past 240 hour(s))  MRSA PCR Screening     Status: None   Collection Time: 06/13/16  1:50 PM  Result Value Ref Range Status   MRSA by PCR NEGATIVE NEGATIVE Final    Comment:        The GeneXpert MRSA Assay (FDA approved for NASAL specimens only), is one component of a comprehensive MRSA colonization surveillance program. It is not intended to diagnose MRSA infection nor to guide or monitor treatment for MRSA infections.          Radiology Studies: No results found.      Scheduled Meds: . allopurinol  100 mg Oral Daily  . benzonatate  100 mg Oral TID  . cholestyramine light  4 g Oral Q12H  . heparin  5,000 Units Subcutaneous Q8H  . hydrALAZINE  12.5 mg Oral Q8H  . insulin aspart  0-20 Units Subcutaneous TID WC  . insulin aspart  0-5 Units Subcutaneous QHS  . insulin aspart  3 Units Subcutaneous TID WC  . insulin glargine  8 Units Subcutaneous BH-q7a  . ipratropium-albuterol  3 mL Nebulization TID  . isosorbide mononitrate  30 mg Oral Daily  . lactulose  30 g Oral TID  . levofloxacin  500 mg Oral Q48H  . methylPREDNISolone (SOLU-MEDROL) injection  60 mg Intravenous  Q12H  . metolazone  5 mg Oral BID  . pantoprazole  40 mg Oral Daily  . rifaximin  400 mg Oral TID  . sodium chloride flush  10-40 mL Intracatheter Q12H  . sodium chloride flush  3 mL Intravenous Q12H   Continuous Infusions: . amiodarone 30 mg/hr (06/17/16 1218)  . furosemide (LASIX) infusion 20 mg/hr (06/17/16 0837)  . milrinone 0.25 mcg/kg/min (06/17/16 0700)     LOS: 6 days    Time spent: 30 minutes    Dessa Phi, DO [facility] Pager [phone]  If 7PM-7AM, please contact night-coverage [email or web address] Password [email or web address] 06/17/2016, 2:15 PM

## 2016-06-17 NOTE — Consult Note (Signed)
Consultation Note Date: 06/17/2016   Patient Name: Cassandra Bonilla  DOB: 14-Oct-1940  MRN: [id number]  Age / Sex: 75 y.o., female  PCP: Marco Collie, MD Referring Physician: Shon Millet*  Reason for Consultation: Establishing goals of care  HPI/Patient Profile: 76 y.o. female  with past medical history of severe COPD, combined systolic/diastolic heart failure EF 40-45%, pulmonary hypertension, chronic atrial fibrillation, CAD s/p CABG, diabetes mellitus, CKD IV, cryptogenic cirrhosis  admitted on 06/11/2016 with shortness of breath r/t underlying COPD with severe biventricular disease. She is requiring inotropic support, Lasix infusion, amiodarone infusion with continued volume overload.   Clinical Assessment and Goals of Care: I had a long discussion today with [name] along with her husband, [name], at bedside. [name] speaks much of her faith. She does not believe that she is approaching end of life at this time and believes that she has more that she needs to accomplish before dying. She uses multiple biblical references repeatedly. She clearly draws strength and joy from her faith. When I ask if God decided to take her before she was able to accomplish all these tasks she tells me "Oh that would be alright with me, I'm ready to go to heaven, but that's not going to happen."   Much time was spent listening to [name] - she seems to realize that she is severely ill with heart failure and complicated by renal, pulmonary, and liver disease. She seems to know the basics of her illness. Her husband attempted to help me elicit her wishes regarding resuscitation but we were somewhat unsuccessful. She made many contradicting statements. I did believe that she was telling us she would not want resuscitation and life support but when her husband attempted to clarify these wishes she says "no I didn't say that." I  tried to explain that we need to know her wishes if she were to die as she will be unable to tell us at that time - she disagrees and insists "I will let you know when that happens what God tells me to do." Unfortunately we were unable to obtain any further clarification.   Her husband tells me that this is not unusual for her to speak in this way and for [name] not to really understand what she really wants. Unsure that she has full capacity for decisions at this point as she repeated many things to me in conversation and unable to rationalize the importance of decision making. At this point I feel as she worsens she will likely become more confused and decisions will be up to her husband of 23 yrs. [name] does speak with me privately and inquires about having hospice at home, which I agree would be appropriate. I told him that I support hospice for her but she continues to require much support here in the hospital at this time. We will continue to discuss over the next few days.   Therapeutic listening and emotional support provided.   NEXT OF KIN husband [name] (he is retired  hospice chaplain)    SUMMARY OF RECOMMENDATIONS   Difficult conversation with no real insight into [name]'s GOC/wishes except I do not believe that she is accepting of how critically ill she has become. 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   Code Status/Advance Care Planning:  Full code - her wishes are mostly unclear at this point.    Symptom Management:   Volume overload: Tolerating on room air currently. Manage per heart failure team.   Wheezing: Per RT and primary team.   Itching: Continue Sarna lotion - she says this helps. Benadryl and Vistaril prn. Likely r/t cirrhosis.   Palliative Prophylaxis:   Bowel Regimen, Delirium Protocol, Frequent Pain Assessment and Turn  Reposition  Additional Recommendations (Limitations, Scope, Preferences):  Full Scope Treatment  Psycho-social/Spiritual:   Desire for further Chaplaincy support:yes  Additional Recommendations: Caregiving  Support/Resources and Education on Hospice  Prognosis:   Unable to determine but very poor with worsening heart failure and not responding well to inotropes and aggressive diuresis.   Discharge Planning: To Be Determined      Primary Diagnoses: Present on Admission: . COPD exacerbation ([name]) . Cryptogenic cirrhosis of liver ([name]) . Hyperkalemia . Respiratory distress . Acute on chronic combined systolic and diastolic heart failure ([place]) . Chronic atrial fibrillation ([place]) . CKD (chronic kidney disease), stage IV ([place]) . Gout . GERD (gastroesophageal reflux disease) . Essential hypertension . Encephalopathy, hepatic ([place])   I have reviewed the medical record, interviewed the patient and family, and examined the patient. The following aspects are pertinent.  Past Medical History:  Diagnosis Date  . CHF (congestive heart failure) ([name])   . CKD (chronic kidney disease) stage 3, GFR 30-59 ml/min   . Colon polyps 05/28/2016   3 ascending, 3 transverse, 1 descending  . COPD (chronic obstructive pulmonary disease) ([place])   . Coronary artery disease   . Cryptogenic cirrhosis ([name])   . Diabetes mellitus without complication ([name])   . Gout   . Hypertension   . Hypokalemia 03/21/2016   Social History   Social History  . Marital status: Married    Spouse name: N/A  . Number of children: N/A  . Years of education: N/A   Social History Main Topics  . Smoking status: Former Research scientist (life sciences)  . Smokeless tobacco: Never Used     Comment: quit smoking  in 1993  . Alcohol use No  . Drug use: No  . Sexual activity: Not Asked   Other Topics Concern  . None   Social History Narrative  . None   Family History  Problem Relation Age of Onset  . Breast cancer Mother     Scheduled Meds: . allopurinol  100 mg Oral Daily  . benzonatate  100 mg Oral TID  . cholestyramine light  4 g Oral Q12H  . heparin  5,000 Units Subcutaneous Q8H  . hydrALAZINE  12.5 mg Oral Q8H  . insulin aspart  0-20 Units Subcutaneous TID WC  . insulin aspart  0-5 Units Subcutaneous QHS  . insulin aspart  3 Units Subcutaneous TID WC  . insulin glargine  8 Units Subcutaneous BH-q7a  . ipratropium-albuterol  3 mL Nebulization TID  . isosorbide mononitrate  30 mg Oral Daily  . lactulose  30 g Oral TID  . levofloxacin  500 mg Oral Q48H  . methylPREDNISolone (SOLU-MEDROL) injection  60  mg Intravenous Q12H  . metolazone  5 mg Oral BID  . pantoprazole  40 mg Oral Daily  . rifaximin  400 mg Oral TID  . sodium chloride flush  10-40 mL Intracatheter Q12H  . sodium chloride flush  3 mL Intravenous Q12H   Continuous Infusions: . amiodarone 30 mg/hr (06/17/16 1218)  . furosemide (LASIX) infusion 20 mg/hr (06/17/16 0837)  . milrinone 0.25 mcg/kg/min (06/16/16 2043)   PRN Meds:.sodium chloride, sodium chloride, acetaminophen, albuterol, camphor-menthol, diphenhydrAMINE, guaiFENesin-codeine, hydrOXYzine, metoprolol, ondansetron **OR** ondansetron (ZOFRAN) IV, sodium chloride flush, sodium chloride flush Medications Prior to Admission:  Prior to Admission medications   Medication Sig Start Date End Date Taking? Authorizing Provider  acetaminophen (TYLENOL) 500 MG tablet Take 1 tablet (500 mg total) by mouth every 6 (six) hours as needed for mild pain (or Fever >/= 101). 06/04/16  Yes Albertine Patricia, MD  allopurinol (ZYLOPRIM) 100 MG tablet Take 100 mg by mouth daily.   Yes Historical Provider, MD  atorvastatin (LIPITOR) 40 MG tablet Take 40 mg by mouth daily.   Yes Historical Provider, MD  cholestyramine light (PREVALITE) 4 g packet Take 1 packet (4 g total) by mouth every 12 (twelve) hours. 06/04/16  Yes Albertine Patricia, MD  Dextromethorphan-Guaifenesin (ROBITUSSIN DM) 10-100 MG/5ML  liquid Take 10 mLs by mouth every 6 (six) hours as needed (coughing).   Yes Historical Provider, MD  hydrOXYzine (ATARAX/VISTARIL) 25 MG tablet Take 1-2 tablets (25-50 mg total) by mouth every 6 (six) hours as needed for itching. Patient taking differently: Take 25-50 mg by mouth See admin instructions. Take 2 tablets (50 mg) by mouth daily at bedtime, may also take 1 tablet (25 mg) 3 times during the day as needed for itching 03/24/16  Yes Hosie Poisson, MD  insulin glargine (LANTUS) 100 UNIT/ML injection Inject 0.05 mLs (5 Units total) into the skin every morning. 06/04/16  Yes Silver Huguenin Elgergawy, MD  insulin lispro (HUMALOG) 100 UNIT/ML injection Inject 2-15 Units into the skin 3 (three) times daily after meals. 2-8 units if CBG <400, takes 15 units if CBG >400 (per sliding scale)   Yes Historical Provider, MD  lactulose (CHRONULAC) 10 GM/15ML solution Take 45 mLs (30 g total) by mouth 3 (three) times daily. 06/04/16  Yes Albertine Patricia, MD  metoprolol succinate (TOPROL-XL) 25 MG 24 hr tablet Take 1 tablet (25 mg total) by mouth daily. 06/05/16  Yes Silver Huguenin Elgergawy, MD  pantoprazole (PROTONIX) 40 MG tablet Take 40 mg by mouth daily.    Yes Historical Provider, MD  potassium chloride SA (K-DUR,KLOR-CON) 20 MEQ tablet Take 2 tablets (40 mEq total) by mouth daily. 05/03/16  Yes Kelvin Cellar, MD  rifaximin (XIFAXAN) 200 MG tablet Take 2 tablets (400 mg total) by mouth 3 (three) times daily. Patient taking differently: Take 400 mg by mouth 2 (two) times daily.  05/03/16  Yes Kelvin Cellar, MD  torsemide (DEMADEX) 20 MG tablet TAKE 3 TABLETS BY MOUTH TWICE A DAY OR AS DIRECTED Patient taking differently: Take 60 mg by mouth 2 (two) times daily. OR AS DIRECTED 06/10/16  Yes Skeet Latch, MD   Allergies  Allergen Reactions  . Contrast Media [Iodinated Diagnostic Agents] Hives  . Prednisone Other (See Comments)    Confusion  . Lactose Intolerance (Gi) Diarrhea    There are times when/if the  patient consumes milk products, it results in diarrhea   Review of Systems  Constitutional: Positive for activity change, appetite change and fatigue.  Skin:  itching  Neurological: Positive for weakness.    Physical Exam  Constitutional: She appears well-developed.  HENT:  Head: Normocephalic and atraumatic.  Cardiovascular: An irregularly irregular rhythm present. Tachycardia present.   Pulmonary/Chest: No accessory muscle usage. No tachypnea. No respiratory distress. She has wheezes. She has rhonchi.  Abdominal: Normal appearance.  Neurological: She is alert.  Mostly oriented to person, place, somewhat to situation    Vital Signs: BP 120/81 (BP Location: Right Arm)   Pulse (!) 104   Temp 98.4 F (36.9 C) (Oral)   Resp 16   Ht 5\' 2"  (1.575 m)   Wt 70.1 kg (154 lb 8.7 oz)   SpO2 100%   BMI 28.27 kg/m  Pain Assessment: No/denies pain   Pain Score: 0-No pain   SpO2: SpO2: 100 % O2 Device:SpO2: 100 % O2 Flow Rate: .   IO: Intake/output summary:  Intake/Output Summary (Last 24 hours) at 06/17/16 1242 Last data filed at 06/17/16 1000  Gross per 24 hour  Intake          1539.68 ml  Output             2075 ml  Net          -535.32 ml    LBM: Last BM Date: 06/16/16 Baseline Weight: Weight: 73.9 kg (162 lb 14.7 oz) Most recent weight: Weight: 70.1 kg (154 lb 8.7 oz)     Palliative Assessment/Data: PPS: 20%     Time In: 0950 Time Out: 1150 Time Total: 112min Greater than 50%  of this time was spent counseling and coordinating care related to the above assessment and plan.  Signed by: Pershing Proud, NP   Please contact Palliative Medicine Team phone at [phone] for questions and concerns.  For individual provider: See Shea Evans

## 2016-06-18 DIAGNOSIS — Z7189 Other specified counseling: Secondary | ICD-10-CM

## 2016-06-18 LAB — CBC WITH DIFFERENTIAL/PLATELET
Basophils Absolute: 0 10*3/uL (ref 0.0–0.1)
Basophils Relative: 0 %
EOS PCT: 0 %
Eosinophils Absolute: 0 10*3/uL (ref 0.0–0.7)
HEMATOCRIT: 26.8 % — AB (ref 36.0–46.0)
Hemoglobin: 8.2 g/dL — ABNORMAL LOW (ref 12.0–15.0)
LYMPHS ABS: 0.1 10*3/uL — AB (ref 0.7–4.0)
LYMPHS PCT: 2 %
MCH: 24.9 pg — AB (ref 26.0–34.0)
MCHC: 30.6 g/dL (ref 30.0–36.0)
MCV: 81.5 fL (ref 78.0–100.0)
MONO ABS: 0.3 10*3/uL (ref 0.1–1.0)
MONOS PCT: 5 %
NEUTROS ABS: 5.1 10*3/uL (ref 1.7–7.7)
Neutrophils Relative %: 92 %
Platelets: 178 10*3/uL (ref 150–400)
RBC: 3.29 MIL/uL — ABNORMAL LOW (ref 3.87–5.11)
RDW: 18.1 % — AB (ref 11.5–15.5)
WBC: 5.6 10*3/uL (ref 4.0–10.5)

## 2016-06-18 LAB — GLUCOSE, CAPILLARY
GLUCOSE-CAPILLARY: 251 mg/dL — AB (ref 65–99)
Glucose-Capillary: 209 mg/dL — ABNORMAL HIGH (ref 65–99)
Glucose-Capillary: 200 mg/dL — ABNORMAL HIGH (ref 65–99)
Glucose-Capillary: 193 mg/dL — ABNORMAL HIGH (ref 65–99)

## 2016-06-18 LAB — BASIC METABOLIC PANEL
ANION GAP: 14 (ref 5–15)
BUN: 100 mg/dL — AB (ref 6–20)
CALCIUM: 8.9 mg/dL (ref 8.9–10.3)
CO2: 21 mmol/L — ABNORMAL LOW (ref 22–32)
CREATININE: 3.15 mg/dL — AB (ref 0.44–1.00)
Chloride: 93 mmol/L — ABNORMAL LOW (ref 101–111)
GFR calc Af Amer: 16 mL/min — ABNORMAL LOW (ref >60)
GFR, EST NON AFRICAN AMERICAN: 13 mL/min — AB (ref >60)
GLUCOSE: 335 mg/dL — AB (ref 65–99)
Potassium: 3.4 mmol/L — ABNORMAL LOW (ref 3.5–5.1)
Sodium: 128 mmol/L — ABNORMAL LOW (ref 135–145)

## 2016-06-18 LAB — CARBOXYHEMOGLOBIN
Carboxyhemoglobin: 2.1 % — ABNORMAL HIGH (ref 0.5–1.5)
Methemoglobin: 0.3 % (ref 0.0–1.5)
O2 Saturation: 63.9 %
Total hemoglobin: 8.7 g/dL — ABNORMAL LOW (ref 12.0–16.0)

## 2016-06-18 LAB — AMMONIA: AMMONIA: 39 umol/L — AB (ref 9–35)

## 2016-06-18 MED ORDER — LORAZEPAM 2 MG/ML IJ SOLN
1.0000 mg | Freq: Four times a day (QID) | INTRAMUSCULAR | Status: DC | PRN
  Administered 2016-06-18 – 2016-06-21 (×2): 1 mg via INTRAVENOUS
  Filled 2016-06-18 (×2): qty 1

## 2016-06-18 MED ORDER — POTASSIUM CHLORIDE CRYS ER 20 MEQ PO TBCR
40.0000 meq | EXTENDED_RELEASE_TABLET | Freq: Two times a day (BID) | ORAL | Status: DC
  Administered 2016-06-18: 40 meq via ORAL
  Filled 2016-06-18: qty 2

## 2016-06-18 MED ORDER — ACETAZOLAMIDE 250 MG PO TABS
500.0000 mg | ORAL_TABLET | Freq: Two times a day (BID) | ORAL | Status: DC
  Administered 2016-06-18 – 2016-06-22 (×8): 500 mg via ORAL
  Filled 2016-06-18 (×9): qty 2

## 2016-06-18 NOTE — Progress Notes (Signed)
Patient ID: Ardene Hochhalter, female   DOB: 1941-04-14, 75 y.o.   MRN: [id number]   Patient Name: Tivona Winningham Date of Encounter: 06/18/2016  Primary Cardiologist: Dr. Oval Linsey    Subjective   Chronically ill woman with severe COPD and mixed systolic/diastolic HF and pulmonary HTN. Underwent RHC which showed severely elevated biventricular pressures with low cardiac output and RV strain. Moved to SDU. PICC placed and milrinone started. Increased lasix.   Co-ox 64% this am on milrinone 0.25 mcg/kg/min.  On milrinone 0.25 mcg + amio drip at 30 mg per hour. Yesterday started on lasix drip at 20 mg hour and continued on metolazone.  Poor diuresis noted.  Weight up 2 ounds.   Denies SOB   RHC 06/13/16  RA = 30 RV = 68/26 PA = 69/25 (43) PCW = 33 Fick cardiac output/index = 3.7/2.1 Thermo CO/CI = 4.0/2.3 PVR = 2.5 WU Ao sat = 99% PA sat = 45%, 46%  Echo (9/17) with EF 40-45%, PASP 75, RV mildly dilated/mild-moderately decreased systolic function, severe TR.   Inpatient Medications    Scheduled Meds: . allopurinol  100 mg Oral Daily  . benzonatate  100 mg Oral TID  . cholestyramine light  4 g Oral Q12H  . heparin  5,000 Units Subcutaneous Q8H  . hydrALAZINE  12.5 mg Oral Q8H  . insulin aspart  0-20 Units Subcutaneous TID WC  . insulin aspart  0-5 Units Subcutaneous QHS  . insulin aspart  3 Units Subcutaneous TID WC  . insulin glargine  8 Units Subcutaneous BH-q7a  . ipratropium-albuterol  3 mL Nebulization TID  . isosorbide mononitrate  30 mg Oral Daily  . lactulose  30 g Oral TID  . levofloxacin  500 mg Oral Q48H  . methylPREDNISolone (SOLU-MEDROL) injection  60 mg Intravenous Q12H  . metolazone  5 mg Oral BID  . pantoprazole  40 mg Oral Daily  . rifaximin  400 mg Oral TID  . sodium chloride flush  10-40 mL Intracatheter Q12H  . sodium chloride flush  3 mL Intravenous Q12H   Continuous Infusions: . amiodarone 30 mg/hr (06/18/16 0104)  . furosemide (LASIX) infusion 20 mg/hr  (06/17/16 2253)  . milrinone 0.25 mcg/kg/min (06/18/16 0611)   PRN Meds:.sodium chloride, sodium chloride, acetaminophen, albuterol, camphor-menthol, diphenhydrAMINE, guaiFENesin-codeine, hydrOXYzine, metoprolol, ondansetron **OR** ondansetron (ZOFRAN) IV, sodium chloride flush, sodium chloride flush   Vital Signs    Vitals:   06/18/16 0000 06/18/16 0200 06/18/16 0400 06/18/16 0508  BP: 97/73 125/72 (!) 128/58   Pulse: (!) 110 (!) 120 (!) 111   Resp: 18 16 17    Temp: 98.4 F (36.9 C)  97.7 F (36.5 C)   TempSrc: Oral  Oral   SpO2: 100% 96% 97%   Weight:    156 lb 12 oz (71.1 kg)  Height:        Intake/Output Summary (Last 24 hours) at 06/18/16 0730 Last data filed at 06/18/16 0600  Gross per 24 hour  Intake          1710.34 ml  Output             1950 ml  Net          -239.66 ml   Filed Weights   06/16/16 0323 06/17/16 0400 06/18/16 0508  Weight: 156 lb 1.4 oz (70.8 kg) 154 lb 8.7 oz (70.1 kg) 156 lb 12 oz (71.1 kg)    Physical Exam  CVP 29-30 GEN: Elderly and chronically ill appearing.  Ub bed  HEENT: Normal Neck: Supple, JVP to jaw, carotid bruits, or masses. Cardiac: RRR, no murmurs, rubs, or gallops. No clubbing, cyanosis, edema.  Radials/DP/PT 2+ and equal bilaterally.  Respiratory:  Wheezing/rhonchi throughout.  GI: Soft, NT, ND, no HSM. No bruits or masses. +BS  MS: no deformity or atrophy. RUE PICC  Skin: warm and dry, no rash. Neuro:  Alert and oriented to person.  Psych: Alert to person.  Flat affect.  Labs    CBC  Recent Labs  06/17/16 0300 06/18/16 0345  WBC 5.8 5.6  NEUTROABS 5.2 5.1  HGB 8.6* 8.2*  HCT 28.4* 26.8*  MCV 81.6 81.5  PLT 191 0000000   Basic Metabolic Panel  Recent Labs  06/17/16 0300 06/18/16 0345  NA 133* 128*  K 3.3* 3.4*  CL 99* 93*  CO2 19* 21*  GLUCOSE 149* 335*  BUN 97* 100*  CREATININE 3.16* 3.15*  CALCIUM 8.7* 8.9   Liver Function Tests No results for input(s): AST, ALT, ALKPHOS, BILITOT, PROT, ALBUMIN in the  last 72 hours. Cardiac Enzymes No results for input(s): CKTOTAL, CKMB, CKMBINDEX, TROPONINI in the last 72 hours.  Telemetry     atrial fibrillation- Personally Reviewed   Radiology    No results found.   Cardiac Studies   Transthoracic Echocardiography 05/26/16 Study Conclusions  - Left ventricle: The cavity size was normal. Wall thickness was   increased in a pattern of mild LVH. Systolic function was mildly   to moderately reduced. The estimated ejection fraction was in the   range of 40% to 45%. - Ventricular septum: The contour showed diastolic flattening and   systolic flattening. - Aortic valve: Severely calcified annulus. - Mitral valve: There was mild to moderate regurgitation. - Left atrium: The atrium was moderately dilated. - Right ventricle: The cavity size was mildly dilated. Systolic   function was mildly to moderately reduced. - Right atrium: The atrium was mildly dilated. - Tricuspid valve: There was severe regurgitation. - Pulmonary arteries: Systolic pressure was severely increased. PA   peak pressure: 75 mm Hg (S).  Patient Profile  75 yo female with PMH of chronic systolic and diastolic heart failure (LVEF 40-45%), chronic atrial fibrillation COPD, carotid stenosis s/p CEA, CAD s/p CABG, diabetes mellitus, CKD 4, GI bleed, cryptogenic cirrhosis, and severe pulmonary hypertension who presented with worsening dyspnea.      Assessment   1. Acute on chronic combined biventricular failure: Likely end stage 2. Pulmonary HTN: Looks like primarily pulmonary venous hypertension.  3. Severe COPD 4. AKI on CKD IV 5. Hx of hepatic encephalopathy and cryptogenic cirrhosis 6. Chronic atrial fibrillation.   Plan    PICC placed and started on milrinone with elevated filling pressures, low output, and marked volume overload.       Marked volume overload despite high dose lasix + metolazone. Weight trending up. CVP trending up. Not sure theres much more to  offer.    BP low this morning. Continue current dose of hydralazine/nitrates.   On levaquin 3/5 doses for COPD exacerbation.  On prednisone and nebs, still wheezing/rhonchorous.   Increased confusion. Continue lactulose for hepatic encephalopathy. Most recent Ammonia 42. Repeat ammonia.   Chronic atrial fibrillation but HR better controlled 100s.  Will use amiodarone gtt for now for rate control.    Prognosis very tenuous. 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    Palliative Care consult for Goals of Care.   Signed,  Darrick Grinder, NP-C 06/18/2016, 7:30 AM   Advanced Heart Failure Team Pager [phone] (M-F; [place])  Please contact [facility] for night-coverage after hours (4p -7a ) and weekends on [email or web address]  Patient seen and examined with Darrick Grinder, NP. We discussed all aspects of the encounter. I agree with the assessment and plan as stated above.   She feels better. Co-ox improved on milrinone. Renal function slightly better but CVP remains very high (~30) despite high dose diuretics. Will add diamox 500 bid to see if this helps. Long talk with her and her husband with Palliative Care in room and she is unable to adequately address her situation. Says she puts her faith in God to make her better.   Bensimhon, Daniel,MD 1:49 PM

## 2016-06-18 NOTE — Progress Notes (Signed)
Patient very confused today. Agitation is increasing this evening, becoming very demanding, wants to be allowed to go to church right now. Does not understand that she is very ill and cannot leave. Husband is at the bedside as well as daughter and friend. Vital signs are stable. Call Bell is in reach, Will continue to monitor.

## 2016-06-18 NOTE — Progress Notes (Signed)
Paged attending due to increasing agitation. Waiting on call back.

## 2016-06-18 NOTE — Progress Notes (Signed)
Pt unable to take PO medications tonight due to confusion, agitation and unable to follow commands when asked. Pt will not swallow or cough on command. Lamar Blinks NP paged to notify of situation. Pt's daughter currently at bedside.

## 2016-06-18 NOTE — Progress Notes (Signed)
PROGRESS NOTE    Cassandra Bonilla  [id number] DOB: 1941-06-08 DOA: 06/11/2016 PCP: Marco Collie, MD      Brief Narrative:  Cassandra Bonilla is a 75 y.o. female with medical history significant of CAD, COPD, CAD, DM, gout, HTN, presenting with 5 day history of worsening cough wheezing and shortness of breath. Patient reports one day ago saying her cardiologist to switch her from furosemide to torsemide due to concern for volume overload. No increased diuresis after changing to torsemide. Associated with generalized weakness and increased respiratory effort. Cough is occasionally productive. No home inhalers.   Patient was admitted for respiratory distress due to COPD exacerbation and CHF exacerbation. She was transferred to stepdown for PICC placement and milrinone gtt. Heart failure team is following. Prognosis is poor and palliative care was consulted.   Assessment & Plan:   Principal Problem:   Acute on chronic combined systolic and diastolic heart failure (HCC) Active Problems:   Chronic atrial fibrillation (HCC)   Type 2 diabetes mellitus with stage 4 chronic kidney disease, with long-term current use of insulin (HCC)   CKD (chronic kidney disease), stage IV (HCC)   Encephalopathy, hepatic (HCC)   Cryptogenic cirrhosis of liver (HCC)   COPD exacerbation (HCC)   Hyperkalemia   Respiratory distress   Gout   GERD (gastroesophageal reflux disease)   Essential hypertension   DNR (do not resuscitate) discussion   Acute on chronic combined systolic and diastolic CHF exacerbation and pHTN  -BNP on admission 4262  -Daily weights, Strict I/Os  -S/p RHC 9/28: "Severely elevated biventricular pressures with moderate pulmonary venous HTN and evidence of right heart failure"  -Appreciate cardiology, heart failure team -Diuresis and optimization with lasix, metolazone, milrinone  -Consulted palliative care. Discussed with patient and husband at length today. Patient continues to refer to her  faith and that she believes she will improve, despite my concern regarding her lack of improvement with maximal therapies. Husband seems to understand. I did discuss code status again with them today; currently FULL code. I also discussed with husband that he may have to make decisions for her at some point as I am not sure that patient fully understands the gravity of her situation and poor prognosis.   COPD exacerbation -Levaquin for total 10 days to cover bronchitis  -Duonebs  -Solumedrol  -Tessalon pearls and guaifenesine/codeine for cough   Elevated troponin -Likely secondary to CHF exacerbation as well as decreased renal function -No chest pain   -No ST changes on admission EKG    Chronic atrial fibrillation  -Recently admitted for GI bleed, coumadin held  -CHA2DS2-VASc 6 -Continue metoprolol  -Amiodarone gtt   DM type 2, well controlled, with complication including CKD stage 4-5  -9/8 hemoglobin A1c= 7.4 -Lantus 8 units QAM -SSI scale to resistant   CKD stage 4-5  -Cr fluctuates between 2.5-3.5 at baseline  -Monitor BMP  -[facility]  Hepatic encephalopathy and cryptogenic cirrhosis: previously admitted with hepatic encephalopathy  -Continue Lactulose, Xifaxan -Follows  GI   Gout -Continue allopurinol  HTN -Continue metoprolol, hydralazine/imdur   GERD: -Continue protonix   DVT prophylaxis: subq hep  Code Status: Full Family Communication: spoke with husband today at bedside  Disposition Plan: Continue diuresis and management of this patient's heart failure and pulm HTN. Poor prognosis   Consultants:   Cardiology   Advanced heart failure   Procedures:   Right heart cath 9/28   Antimicrobials:  Levaquin 9/26 >> total 10  day course    Subjective: No other complaints. No worsening chest pain, SOB.   Objective: Vitals:   06/18/16 1100 06/18/16 1140 06/18/16 1200 06/18/16 1400  BP:  135/62 135/62 121/65 137/61  Pulse: (!) 114 (!) 111 (!) 122 (!) 111  Resp: 19 (!) 22 15 20   Temp:  98 F (36.7 C)    TempSrc:  Oral    SpO2: 100% 100% 99% 100%  Weight:      Height:        Intake/Output Summary (Last 24 hours) at 06/18/16 1441 Last data filed at 06/18/16 1100  Gross per 24 hour  Intake           1265.2 ml  Output             1051 ml  Net            214.2 ml   Filed Weights   06/16/16 0323 06/17/16 0400 06/18/16 0508  Weight: 70.8 kg (156 lb 1.4 oz) 70.1 kg (154 lb 8.7 oz) 71.1 kg (156 lb 12 oz)    Examination:  General exam: Appears calm and comfortable  Respiratory system: Decreased inspiratory effort. +rhonchi improved today. Respiratory effort normal. Cardiovascular system: S1 & S2 heard, irregular rhythm, tachycardic Gastrointestinal system: Abdomen is nondistended, soft and nontender. No organomegaly or masses felt. Normal bowel sounds heard. Central nervous system: Alert and oriented to self  Extremities: Symmetric Skin: No rashes, lesions or ulcers Psychiatry: Judgement and insight appear poor  Data Reviewed: I have personally reviewed following labs and imaging studies  CBC:  Recent Labs Lab 06/14/16 0500 06/15/16 0320 06/16/16 0335 06/17/16 0300 06/18/16 0345  WBC 4.1 6.3 4.4 5.8 5.6  NEUTROABS 3.6 5.8 4.1 5.2 5.1  HGB 8.2* 8.3* 8.4* 8.6* 8.2*  HCT 27.1* 27.1* 28.2* 28.4* 26.8*  MCV 83.4 83.6 82.5 81.6 81.5  PLT 214 200 193 191 0000000   Basic Metabolic Panel:  Recent Labs Lab 06/11/16 1805  06/14/16 0500 06/15/16 0320 06/16/16 0335 06/17/16 0300 06/18/16 0345  NA  --   < > 135 134* 134* 133* 128*  K  --   < > 4.2 3.8 3.8 3.3* 3.4*  CL  --   < > 105 104 101 99* 93*  CO2  --   < > 20* 20* 21* 19* 21*  GLUCOSE  --   < > 182* 260* 257* 149* 335*  BUN  --   < > 95* 93* 97* 97* 100*  CREATININE 3.59*  < > 3.48* 3.30* 3.21* 3.16* 3.15*  CALCIUM  --   < > 8.1* 8.2* 8.6* 8.7* 8.9  MG 1.9  --   --   --   --   --   --   PHOS 5.5*  --    --   --   --   --   --   < > = values in this interval not displayed. GFR: Estimated Creatinine Clearance: 14.3 mL/min (by C-G formula based on SCr of 3.15 mg/dL (H)). Liver Function Tests:  Recent Labs Lab 06/12/16 0341  AST 26  ALT 18  ALKPHOS 184*  BILITOT 1.4*  PROT 7.0  ALBUMIN 2.8*   No results for input(s): LIPASE, AMYLASE in the last 168 hours.  Recent Labs Lab 06/11/16 1821 06/14/16 1250 06/18/16 1205  AMMONIA 64* 42* 39*   Coagulation Profile: No results for input(s): INR, PROTIME in the last 168 hours. Cardiac Enzymes:  Recent Labs Lab 06/11/16 1805  TROPONINI 0.05*  BNP (last 3 results) No results for input(s): PROBNP in the last 8760 hours. HbA1C: No results for input(s): HGBA1C in the last 72 hours. CBG:  Recent Labs Lab 06/17/16 1241 06/17/16 1600 06/17/16 2256 06/18/16 0758 06/18/16 1143  GLUCAP 255* 220* 182* 251* 209*   Lipid Profile: No results for input(s): CHOL, HDL, LDLCALC, TRIG, CHOLHDL, LDLDIRECT in the last 72 hours. Thyroid Function Tests: No results for input(s): TSH, T4TOTAL, FREET4, T3FREE, THYROIDAB in the last 72 hours. Anemia Panel: No results for input(s): VITAMINB12, FOLATE, FERRITIN, TIBC, IRON, RETICCTPCT in the last 72 hours. Sepsis Labs:  Recent Labs Lab 06/11/16 1805 06/13/16 0708  PROCALCITON 0.43 0.34    Recent Results (from the past 240 hour(s))  MRSA PCR Screening     Status: None   Collection Time: 06/13/16  1:50 PM  Result Value Ref Range Status   MRSA by PCR NEGATIVE NEGATIVE Final    Comment:        The GeneXpert MRSA Assay (FDA approved for NASAL specimens only), is one component of a comprehensive MRSA colonization surveillance program. It is not intended to diagnose MRSA infection nor to guide or monitor treatment for MRSA infections.          Radiology Studies: No results found.      Scheduled Meds: . acetaZOLAMIDE  500 mg Oral BID  . allopurinol  100 mg Oral Daily  .  benzonatate  100 mg Oral TID  . cholestyramine light  4 g Oral Q12H  . heparin  5,000 Units Subcutaneous Q8H  . hydrALAZINE  12.5 mg Oral Q8H  . insulin aspart  0-20 Units Subcutaneous TID WC  . insulin aspart  0-5 Units Subcutaneous QHS  . insulin aspart  3 Units Subcutaneous TID WC  . insulin glargine  8 Units Subcutaneous BH-q7a  . ipratropium-albuterol  3 mL Nebulization TID  . isosorbide mononitrate  30 mg Oral Daily  . lactulose  30 g Oral TID  . levofloxacin  500 mg Oral Q48H  . methylPREDNISolone (SOLU-MEDROL) injection  60 mg Intravenous Q12H  . metolazone  5 mg Oral BID  . pantoprazole  40 mg Oral Daily  . potassium chloride  40 mEq Oral BID  . rifaximin  400 mg Oral TID  . sodium chloride flush  10-40 mL Intracatheter Q12H  . sodium chloride flush  3 mL Intravenous Q12H   Continuous Infusions: . amiodarone 30 mg/hr (06/18/16 1400)  . furosemide (LASIX) infusion 30 mg/hr (06/18/16 1412)  . milrinone 0.25 mcg/kg/min (06/18/16 1400)     LOS: 7 days    Time spent: 30 minutes    Dessa Phi, DO [facility] Pager [phone]  If 7PM-7AM, please contact night-coverage [email or web address] Password [email or web address] 06/18/2016, 2:41 PM

## 2016-06-18 NOTE — Care Management Note (Signed)
Case Management Note  Patient Details  Name: Cassandra Bonilla MRN: [id number] Date of Birth: 05/27/1941  Subjective/Objective:   Adm w chf                 Action/Plan: lives w husband, pcp dr Nyra Capes   Expected Discharge Date:                  Expected Discharge Plan:  [place]  In-House Referral:  Hospice / Palliative Care  Discharge planning Services     Post Acute Care Choice:    Choice offered to:     DME Arranged:    DME Agency:     HH Arranged:    HH Agency:     Status of Service:  In process, will continue to follow  If discussed at Long Length of Stay Meetings, dates discussed:    Additional Comments: paliative care seeing. Was act w [facility] til 9-27 and case was closed out. Will moniter for dc needs for [facility] vs hospice as pt progresses.  Lacretia Leigh, RN 06/18/2016, 11:16 AM

## 2016-06-18 NOTE — Progress Notes (Signed)
Physical Therapy Cancellation  Noted pt not responding to efforts to diurese and Palliative consult is pending. Will await results of consult to determine if there is a role for PT.    06/18/16 1115  PT Visit Information  Last PT Received On 06/18/16  Reason Eval/Treat Not Completed Other (comment)     06/18/2016 Barry Brunner, PT Pager: [phone]

## 2016-06-18 NOTE — Progress Notes (Signed)
Inpatient Diabetes Program Recommendations  AACE/ADA: New Consensus Statement on Inpatient Glycemic Control (2015)  Target Ranges:  Prepandial:   less than 140 mg/dL      Peak postprandial:   less than 180 mg/dL (1-2 hours)      Critically ill patients:  140 - 180 mg/dL   Lab Results  Component Value Date   GLUCAP 182 (H) 06/17/2016   HGBA1C 7.4 (H) 05/24/2016    Review of Glycemic Control  Diabetes history: DM 2 Outpatient Diabetes medications: Lantus 5 units Daily, Humalog 2-15 units TID Current orders for Inpatient glycemic control: Lantus 8 units Daily, Novolog  Inpatient Diabetes Program Recommendations:   Glucose still elevated in the 200-300 range. Please increase Novolog Meal coverage to 5 units TID with meals while patient is still on IV Solumedrol 60 mg Q12hrs.   Thanks,  Tama Headings RN, MSN, [facility] Inpatient Diabetes Coordinator Team Pager [phone] (8a-5p)

## 2016-06-19 LAB — CBC WITH DIFFERENTIAL/PLATELET
BASOS ABS: 0 10*3/uL (ref 0.0–0.1)
Basophils Relative: 0 %
EOS PCT: 0 %
Eosinophils Absolute: 0 10*3/uL (ref 0.0–0.7)
HCT: 25.8 % — ABNORMAL LOW (ref 36.0–46.0)
Hemoglobin: 7.9 g/dL — ABNORMAL LOW (ref 12.0–15.0)
LYMPHS ABS: 0.2 10*3/uL — AB (ref 0.7–4.0)
Lymphocytes Relative: 3 %
MCH: 25.1 pg — ABNORMAL LOW (ref 26.0–34.0)
MCHC: 30.6 g/dL (ref 30.0–36.0)
MCV: 81.9 fL (ref 78.0–100.0)
MONO ABS: 0.3 10*3/uL (ref 0.1–1.0)
Monocytes Relative: 5 %
NEUTROS PCT: 92 %
Neutro Abs: 5.9 10*3/uL (ref 1.7–7.7)
PLATELETS: 136 10*3/uL — AB (ref 150–400)
RBC: 3.15 MIL/uL — AB (ref 3.87–5.11)
RDW: 18.1 % — AB (ref 11.5–15.5)
WBC: 6.4 10*3/uL (ref 4.0–10.5)

## 2016-06-19 LAB — CARBOXYHEMOGLOBIN
Carboxyhemoglobin: 2 % — ABNORMAL HIGH (ref 0.5–1.5)
METHEMOGLOBIN: 0.2 % (ref 0.0–1.5)
O2 SAT: 63.8 %
TOTAL HEMOGLOBIN: 8.2 g/dL — AB (ref 12.0–16.0)

## 2016-06-19 LAB — BASIC METABOLIC PANEL
Anion gap: 13 (ref 5–15)
BUN: 98 mg/dL — AB (ref 6–20)
CHLORIDE: 90 mmol/L — AB (ref 101–111)
CO2: 21 mmol/L — AB (ref 22–32)
CREATININE: 3.13 mg/dL — AB (ref 0.44–1.00)
Calcium: 8.5 mg/dL — ABNORMAL LOW (ref 8.9–10.3)
GFR calc Af Amer: 16 mL/min — ABNORMAL LOW (ref >60)
GFR calc non Af Amer: 14 mL/min — ABNORMAL LOW (ref >60)
Glucose, Bld: 440 mg/dL — ABNORMAL HIGH (ref 65–99)
POTASSIUM: 3.2 mmol/L — AB (ref 3.5–5.1)
SODIUM: 124 mmol/L — AB (ref 135–145)

## 2016-06-19 LAB — GLUCOSE, CAPILLARY
GLUCOSE-CAPILLARY: 184 mg/dL — AB (ref 65–99)
GLUCOSE-CAPILLARY: 135 mg/dL — AB (ref 65–99)
Glucose-Capillary: 142 mg/dL — ABNORMAL HIGH (ref 65–99)

## 2016-06-19 MED ORDER — POTASSIUM CHLORIDE 10 MEQ/100ML IV SOLN
10.0000 meq | INTRAVENOUS | Status: AC
  Administered 2016-06-19 (×5): 10 meq via INTRAVENOUS
  Filled 2016-06-19 (×5): qty 100

## 2016-06-19 MED ORDER — GLYCOPYRROLATE 0.2 MG/ML IJ SOLN
0.2000 mg | INTRAMUSCULAR | Status: DC | PRN
  Filled 2016-06-19: qty 1

## 2016-06-19 MED ORDER — POTASSIUM CHLORIDE 10 MEQ/100ML IV SOLN: 10.0000 meq | INTRAVENOUS | Status: DC

## 2016-06-19 NOTE — Progress Notes (Signed)
Daily Progress Note   Patient Name: Cassandra Bonilla       Date: 06/19/2016 DOB: [birth year]  Age: 75 y.o. MRN#: [id number] Attending Physician: Annita Brod, MD Primary Care Physician: Marco Collie, MD Admit Date: 06/11/2016  Reason for Consultation/Follow-up: Establishing goals of care  Subjective: I met again with [name] and husband at bedside. She is more somnolent and will only awaken for a second (she does try and communicate yes/no during this brief time). Noted agitation last night per RN notes. Discussed with husband that I do not believe this is medication induced but progression of decline to end of life. He seems accepting that she is facing EOL but continues to try and arouse her and trying to awaken her so she can eat.   We further discussed his conversation with his children and resuscitation. He seems inclined towards DNR but does not want to go against his children. He says they want to feel like every chance was given for God to heal her. I encouraged him to consider what this would put her through when we have no other options for her. He does comment that he may need to make that decision but desires to maintain full code at this time. I encouraged him to have his children come to bedside so they can see her and we can discuss - he says they are coming this weekend. He knows that she could decline and pass prior to this time. Will f/u tomorrow.   Length of Stay: 8  Current Medications: Scheduled Meds:  . acetaZOLAMIDE  500 mg Oral BID  . allopurinol  100 mg Oral Daily  . benzonatate  100 mg Oral TID  . cholestyramine light  4 g Oral Q12H  . heparin  5,000 Units Subcutaneous Q8H  . hydrALAZINE  12.5 mg Oral Q8H  . insulin aspart  0-20 Units Subcutaneous TID WC  . insulin  aspart  0-5 Units Subcutaneous QHS  . insulin aspart  3 Units Subcutaneous TID WC  . insulin glargine  8 Units Subcutaneous BH-q7a  . ipratropium-albuterol  3 mL Nebulization TID  . isosorbide mononitrate  30 mg Oral Daily  . lactulose  30 g Oral TID  . levofloxacin  500 mg Oral Q48H  . methylPREDNISolone (SOLU-MEDROL) injection  60 mg Intravenous Q12H  . metolazone  5 mg Oral BID  . pantoprazole  40 mg Oral Daily  . potassium chloride  10 mEq Intravenous Q1 Hr x 5  . rifaximin  400 mg Oral TID  . sodium chloride flush  10-40 mL Intracatheter Q12H  . sodium chloride flush  3 mL Intravenous Q12H    Continuous Infusions: . amiodarone 30 mg/hr (06/19/16 0130)  . furosemide (LASIX) infusion 30 mg/hr (06/19/16 0800)  . milrinone 0.25 mcg/kg/min (06/19/16 0900)    PRN Meds: sodium chloride, sodium chloride, acetaminophen, albuterol, camphor-menthol, diphenhydrAMINE, guaiFENesin-codeine, hydrOXYzine, LORazepam, metoprolol, ondansetron **OR** ondansetron (ZOFRAN) IV, sodium chloride flush, sodium chloride flush  Physical Exam  Constitutional: She appears well-developed. She appears lethargic.  HENT:  Head: Normocephalic and atraumatic.  Cardiovascular: An irregularly irregular rhythm present. Tachycardia present.   Pulmonary/Chest: No accessory muscle usage. No tachypnea. No respiratory distress. She has wheezes. She has rhonchi. She has rales.  Abdominal: Soft. Normal appearance.  Neurological: She appears lethargic. She is disoriented.            Vital Signs: BP 107/72   Pulse (!) 101   Temp 97.5 F (36.4 C) (Axillary)   Resp (!) 23   Ht _0  (1.575 m)   Wt 71 kg (156 lb 8.4 oz)   SpO2 100%   BMI 28.63 kg/m  SpO2: SpO2: 100 % O2 Device: O2 Device: Not Delivered O2 Flow Rate:    Intake/output summary:   Intake/Output Summary (Last 24 hours) at 06/19/16 1047 Last data filed at 06/19/16 0900  Gross per 24 hour  Intake             1146 ml  Output             2575 ml    Net            -1429 ml   LBM: Last BM Date: 06/17/16 Baseline Weight: Weight: 73.9 kg (162 lb 14.7 oz) Most recent weight: Weight: 71 kg (156 lb 8.4 oz)       Palliative Assessment/Data: PPS: 20%   Flowsheet Rows   Flowsheet Row Most Recent Value  Intake Tab  Referral Department  Hospitalist  Unit at Time of Referral  ICU  Palliative Care Primary Diagnosis  Cardiac  Date Notified  06/15/16  Palliative Care Type  Return patient Palliative Care  Reason for referral  Clarify Goals of Care  Date of Admission  06/11/16  Date first seen by Palliative Care  06/17/16  # of days Palliative referral response time  2 Day(s)  # of days IP prior to Palliative referral  4  Clinical Assessment  Psychosocial & Spiritual Assessment  Palliative Care Outcomes      Patient Active Problem List   Diagnosis Date Noted  . DNR (do not resuscitate) discussion   . Gout 06/12/2016  . GERD (gastroesophageal reflux disease) 06/12/2016  . Essential hypertension 06/12/2016  . COPD exacerbation ([place]) 06/11/2016  . Hyperkalemia 06/11/2016  . Respiratory distress 06/11/2016  . Acute on chronic combined systolic and diastolic heart failure ([place]) 06/11/2016  . Palliative care encounter   . 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 Pulmonary hypertension   . GIB (gastrointestinal bleeding) 05/24/2016  . Encephalopathy, hepatic ([place]) 05/24/2016  . GI bleed 05/24/2016  . Abnormal alkaline phosphatase test   . Acute encephalopathy 04/22/2016  . Pruritus 03/22/2016  . Anemia of chronic disease 03/22/2016  . Chronic atrial fibrillation ([place]) 03/22/2016  . Type 2 diabetes mellitus with stage 4 chronic kidney  disease, with long-term current use of insulin ([place]) 03/22/2016  . CKD (chronic kidney disease), stage IV ([place]) 03/22/2016    Palliative Care Assessment & Plan   Patient Profile: 75 y.o. female  with past medical history of severe COPD, combined systolic/diastolic heart failure EF 40-45%,  pulmonary hypertension, chronic atrial fibrillation, CAD s/p CABG, diabetes mellitus, CKD IV, cryptogenic cirrhosis  admitted on 06/11/2016 with shortness of breath r/t underlying COPD with severe biventricular disease. She is requiring inotropic support, Lasix infusion, amiodarone infusion with continued volume overload.    Assessment: Sitting up in bed. No distress. Confused but pleasant.   Recommendations/Plan:  Volume overload: Tolerating on room air currently. Manage per heart failure team.   Wheezing: Per RT and primary team.   Itching: Continue Sarna lotion - she says this helps. Benadryl and Vistaril prn. Likely r/t cirrhosis.    With any respiratory decline I would opt for fentanyl or dilaudid to provide comfort over morphine d/t renal disease.   Secretions: Seems more congested. Robinul prn.   Goals of Care and Additional Recommendations:  Limitations on Scope of Treatment: Full Scope Treatment  Code Status:    Code Status Orders        Start     Ordered   06/11/16 1617  Full code  Continuous     06/11/16 1621    Code Status History    Date Active Date Inactive Code Status Order ID Comments User Context   05/24/2016  7:48 AM 06/04/2016  4:29 PM Full Code [id number]  Reyne Dumas, MD Inpatient   04/20/2016  9:19 PM 05/03/2016  7:07 PM Full Code [id number]  Edwin Dada, MD Inpatient   03/22/2016  3:02 AM 03/24/2016  8:19 PM Full Code [id number]  Norval Morton, MD ED       Prognosis:   < 2 weeks and could likely be days if shifting more to comfort care.   Discharge Planning:  To Be Determined  Care plan was discussed with Darrick Grinder, NP  Thank you for allowing the Palliative Medicine Team to assist in the care of this patient.   Time In: 1000 Time Out: 1045 Total Time 47mn Prolonged Time Billed  no       Greater than 50%  of this time was spent counseling and coordinating care related to the above assessment and plan.  PPershing Proud NP  Please  contact Palliative Medicine Team phone at [phone] questions and concerns.

## 2016-06-19 NOTE — Progress Notes (Signed)
Nutrition Brief Note  Chart reviewed. Pt working toward transitioning to comfort care.  No further nutrition interventions warranted at this time.  Please re-consult as needed.   Cassandra Bonilla A. Jimmye Norman, RD, LDN, CDE Pager: [phone] After hours Pager: [phone]

## 2016-06-19 NOTE — Progress Notes (Signed)
Patient ID: Cassandra Bonilla, female   DOB: Dec 29, 1940, 75 y.o.   MRN: [id number]   Patient Name: Cassandra Bonilla Date of Encounter: 06/19/2016  Primary Cardiologist: Dr. Oval Linsey    Subjective   Chronically ill woman with severe COPD and mixed systolic/diastolic HF and pulmonary HTN. Underwent RHC which showed severely elevated biventricular pressures with low cardiac output and RV strain. Moved to SDU. PICC placed and milrinone started. Increased lasix.   Co-ox 64% this am on milrinone 0.25 mcg/kg/min +  amio drip at 30 mg per hour + lasix drip 30 mg per hour + metolazone.   Weight unchanged.    Over night she received ativan for agitation. Since that time she has drowsy and unable to take pos. Opens eyes.    Benld 06/13/16  RA = 30 RV = 68/26 PA = 69/25 (43) PCW = 33 Fick cardiac output/index = 3.7/2.1 Thermo CO/CI = 4.0/2.3 PVR = 2.5 WU Ao sat = 99% PA sat = 45%, 46%  Echo (9/17) with EF 40-45%, PASP 75, RV mildly dilated/mild-moderately decreased systolic function, severe TR.   Inpatient Medications    Scheduled Meds: . acetaZOLAMIDE  500 mg Oral BID  . allopurinol  100 mg Oral Daily  . benzonatate  100 mg Oral TID  . cholestyramine light  4 g Oral Q12H  . heparin  5,000 Units Subcutaneous Q8H  . hydrALAZINE  12.5 mg Oral Q8H  . insulin aspart  0-20 Units Subcutaneous TID WC  . insulin aspart  0-5 Units Subcutaneous QHS  . insulin aspart  3 Units Subcutaneous TID WC  . insulin glargine  8 Units Subcutaneous BH-q7a  . ipratropium-albuterol  3 mL Nebulization TID  . isosorbide mononitrate  30 mg Oral Daily  . lactulose  30 g Oral TID  . levofloxacin  500 mg Oral Q48H  . methylPREDNISolone (SOLU-MEDROL) injection  60 mg Intravenous Q12H  . metolazone  5 mg Oral BID  . pantoprazole  40 mg Oral Daily  . potassium chloride  40 mEq Oral BID  . rifaximin  400 mg Oral TID  . sodium chloride flush  10-40 mL Intracatheter Q12H  . sodium chloride flush  3 mL Intravenous Q12H    Continuous Infusions: . amiodarone 30 mg/hr (06/19/16 0130)  . furosemide (LASIX) infusion 30 mg/hr (06/18/16 2230)  . milrinone 0.25 mcg/kg/min (06/18/16 1900)   PRN Meds:.sodium chloride, sodium chloride, acetaminophen, albuterol, camphor-menthol, diphenhydrAMINE, guaiFENesin-codeine, hydrOXYzine, LORazepam, metoprolol, ondansetron **OR** ondansetron (ZOFRAN) IV, sodium chloride flush, sodium chloride flush   Vital Signs    Vitals:   06/18/16 2328 06/19/16 0000 06/19/16 0400 06/19/16 0459  BP: 102/64 117/79 (!) 129/57 (!) 129/57  Pulse: (!) 107 (!) 114 98 99  Resp: 20 15 19 14   Temp: 97.3 F (36.3 C)   97.4 F (36.3 C)  TempSrc: Oral   Axillary  SpO2: 98% 99% 99% 98%  Weight:    156 lb 8.4 oz (71 kg)  Height:        Intake/Output Summary (Last 24 hours) at 06/19/16 0741 Last data filed at 06/19/16 0700  Gross per 24 hour  Intake           1291.3 ml  Output             2151 ml  Net           -859.7 ml   Filed Weights   06/17/16 0400 06/18/16 0508 06/19/16 0459  Weight: 154 lb 8.7 oz (70.1 kg) 156 lb 12  oz (71.1 kg) 156 lb 8.4 oz (71 kg)    Physical Exam  CVP 29-30 GEN: Elderly and chronically ill appearing.  In bed  HEENT: Normal Neck: Supple, JVP to jaw, carotid bruits, or masses. Cardiac: RRR, no murmurs, rubs, or gallops. No clubbing, cyanosis, edema.  Radials/DP/PT 2+ and equal bilaterally.  Respiratory:  Wheezing/rhonchi throughout.  GI: Soft, NT, ND, no HSM. No bruits or masses. +BS  MS: no deformity or atrophy. RUE PICC  Skin: warm and dry, no rash. Neuro:  Alert and oriented to person.  Psych: Opens eyes. .  Labs    CBC  Recent Labs  06/18/16 0345 06/19/16 0500  WBC 5.6 6.4  NEUTROABS 5.1 PENDING  HGB 8.2* 7.9*  HCT 26.8* 25.8*  MCV 81.5 81.9  PLT 178 [id number]*   Basic Metabolic Panel  Recent Labs  06/18/16 0345 06/19/16 0500  NA 128* 124*  K 3.4* 3.2*  CL 93* 90*  CO2 21* 21*  GLUCOSE 335* 440*  BUN 100* 98*  CREATININE 3.15* 3.13*   CALCIUM 8.9 8.5*   Liver Function Tests No results for input(s): AST, ALT, ALKPHOS, BILITOT, PROT, ALBUMIN in the last 72 hours. Cardiac Enzymes No results for input(s): CKTOTAL, CKMB, CKMBINDEX, TROPONINI in the last 72 hours.  Telemetry     atrial fibrillation- Personally Reviewed   Radiology    No results found.   Cardiac Studies   Transthoracic Echocardiography 05/26/16 Study Conclusions  - Left ventricle: The cavity size was normal. Wall thickness was   increased in a pattern of mild LVH. Systolic function was mildly   to moderately reduced. The estimated ejection fraction was in the   range of 40% to 45%. - Ventricular septum: The contour showed diastolic flattening and   systolic flattening. - Aortic valve: Severely calcified annulus. - Mitral valve: There was mild to moderate regurgitation. - Left atrium: The atrium was moderately dilated. - Right ventricle: The cavity size was mildly dilated. Systolic   function was mildly to moderately reduced. - Right atrium: The atrium was mildly dilated. - Tricuspid valve: There was severe regurgitation. - Pulmonary arteries: Systolic pressure was severely increased. PA   peak pressure: 75 mm Hg (S).  Patient Profile  75 yo female with PMH of chronic systolic and diastolic heart failure (LVEF 40-45%), chronic atrial fibrillation COPD, carotid stenosis s/p CEA, CAD s/p CABG, diabetes mellitus, CKD 4, GI bleed, cryptogenic cirrhosis, and severe pulmonary hypertension who presented with worsening dyspnea.      Assessment   1. Acute on chronic combined biventricular failure: Likely end stage 2. Pulmonary HTN: Looks like primarily pulmonary venous hypertension.  3. Severe COPD 4. AKI on CKD IV 5. Hx of hepatic encephalopathy and cryptogenic cirrhosis 6. Chronic atrial fibrillation.   Plan    PICC placed and started on milrinone with elevated filling pressures, low output, and marked volume overload.       Marked  volume overload despite high dose lasix + metolazone. CVP remains 28-29. Creatinine unchanged. Supplement K .   BP stable.    On levaquin 3/5 doses for COPD exacerbation.  On prednisone and nebs, still wheezing/rhonchorous.    Continue lactulose for hepatic encephalopathy. Most recent Ammonia 39.    Chronic atrial fibrillation but HR better controlled 90s.  Will use amiodarone gtt for now for rate control.    Prognosis very tenuous. 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  Palliative Care appreciated.  Needs Hospice. Anticipate stopping milrinone and lasix drip later today.   Jeanmarie Hubert, NP-C 06/19/2016, 7:41 AM   Advanced Heart Failure Team Pager [phone] (M-F; 7a - 4p)  Please contact [facility] for night-coverage after hours (4p -7a ) and weekends on [email or web address]  Patient seen and examined with Darrick Grinder, NP. We discussed all aspects of the encounter. I agree with the assessment and plan as stated above.   Right-sided pressures remain markedly elevated despite inotrope support and nearly max dose diuretic therapy. She is not candidate for HD. I think we have exhausted all aggressive measures to improve her condition and will need to transition more to comfort care.  Will begin wean of inotropes and follow closely.   Markez Dowland,MD 9:21 AM

## 2016-06-19 NOTE — Progress Notes (Signed)
Agree with Ms. Esterwood's assessment and plan. Carl E. Gessner, MD, FACG   

## 2016-06-19 NOTE — Progress Notes (Signed)
Daily Progress Note   Patient Name: Cassandra Bonilla       Date: 06/19/2016 DOB: 1941/02/15  Age: 75 y.o. MRN#: [id number] Attending Physician: Annita Brod, MD Primary Care Physician: Marco Collie, MD Admit Date: 06/11/2016  Reason for Consultation/Follow-up: Establishing goals of care  Subjective: Cassandra Bonilla is much more confused today, however, she continues to be pleasant and is smiling when I come in her room. She does at times talk about getting her "shoes and tshirt" and speaks to people who are not present in the room. Husband Cassandra Bonilla is at bedside and helping to reorient her and keep her calm.   I was able to speak with Cassandra Bonilla some today and he seems to understand the gravity of her condition. We discuss overall goals of care and his main concern is trying to get their children to come see her and be here. He feels like some of their children are in denial regarding EOL. He speaks of hope that children will be able to visit with her before deescalation of care. We also discuss resuscitation as she was unclear yesterday what her wishes are and I encourage Cassandra Bonilla that this would not be recommended. Cassandra Bonilla wishes to speak with some of his children who are local before making a final decision. He has worked as a Veterinary surgeon so he is familiar with this process and what resuscitation would mean for her. We also discussed possible transition to hospice facility depending on outcomes over the next few days. We will continue to discuss.   Length of Stay: 8  Current Medications: Scheduled Meds:  . acetaZOLAMIDE  500 mg Oral BID  . allopurinol  100 mg Oral Daily  . benzonatate  100 mg Oral TID  . cholestyramine light  4 g Oral Q12H  . heparin  5,000 Units Subcutaneous Q8H  . hydrALAZINE   12.5 mg Oral Q8H  . insulin aspart  0-20 Units Subcutaneous TID WC  . insulin aspart  0-5 Units Subcutaneous QHS  . insulin aspart  3 Units Subcutaneous TID WC  . insulin glargine  8 Units Subcutaneous BH-q7a  . ipratropium-albuterol  3 mL Nebulization TID  . isosorbide mononitrate  30 mg Oral Daily  . lactulose  30 g Oral TID  . levofloxacin  500 mg Oral Q48H  . methylPREDNISolone (SOLU-MEDROL) injection  60 mg Intravenous Q12H  . metolazone  5 mg Oral BID  . pantoprazole  40 mg Oral Daily  . potassium chloride  10 mEq Intravenous Q1 Hr x 5  . rifaximin  400 mg Oral TID  . sodium chloride flush  10-40 mL Intracatheter Q12H  . sodium chloride flush  3 mL Intravenous Q12H    Continuous Infusions: . amiodarone 30 mg/hr (06/19/16 0130)  . furosemide (LASIX) infusion 30 mg/hr (06/18/16 2230)  . milrinone 0.25 mcg/kg/min (06/18/16 1900)    PRN Meds: sodium chloride, sodium chloride, acetaminophen, albuterol, camphor-menthol, diphenhydrAMINE, guaiFENesin-codeine, hydrOXYzine, LORazepam, metoprolol, ondansetron **OR** ondansetron (ZOFRAN) IV, sodium chloride flush, sodium chloride flush  Physical Exam  Constitutional: She appears well-developed.  HENT:  Head: Normocephalic and atraumatic.  Cardiovascular: An irregularly irregular rhythm present. Tachycardia present.   Pulmonary/Chest: No accessory muscle usage. No tachypnea. No respiratory distress. She has wheezes. She has rhonchi.  Abdominal: Soft. Normal appearance.  Neurological: She is alert. She is disoriented.            Vital Signs: BP 133/84 (BP Location: Left Arm)   Pulse 99   Temp 97.5 F (36.4 C) (Axillary)   Resp 14   Ht 5\' 2"  (1.575 m)   Wt 71 kg (156 lb 8.4 oz)   SpO2 98%   BMI 28.63 kg/m  SpO2: SpO2: 98 % O2 Device: O2 Device: Not Delivered O2 Flow Rate:    Intake/output summary:  Intake/Output Summary (Last 24 hours) at 06/19/16 [id number] Last data filed at 06/19/16 0700  Gross per 24 hour  Intake            1254.6 ml  Output             2150 ml  Net           -895.4 ml   LBM: Last BM Date: 06/17/16 Baseline Weight: Weight: 73.9 kg (162 lb 14.7 oz) Most recent weight: Weight: 71 kg (156 lb 8.4 oz)       Palliative Assessment/Data: PPS: 20%   Flowsheet Rows   Flowsheet Row Most Recent Value  Intake Tab  Referral Department  Hospitalist  Unit at Time of Referral  ICU  Palliative Care Primary Diagnosis  Cardiac  Date Notified  06/15/16  Palliative Care Type  Return patient Palliative Care  Reason for referral  Clarify Goals of Care  Date of Admission  06/11/16  Date first seen by Palliative Care  06/17/16  # of days Palliative referral response time  2 Day(s)  # of days IP prior to Palliative referral  4  Clinical Assessment  Psychosocial & Spiritual Assessment  Palliative Care Outcomes      Patient Active Problem List   Diagnosis Date Noted  . DNR (do not resuscitate) discussion   . Gout 06/12/2016  . GERD (gastroesophageal reflux disease) 06/12/2016  . Essential hypertension 06/12/2016  . COPD exacerbation ([place]) 06/11/2016  . Hyperkalemia 06/11/2016  . Respiratory distress 06/11/2016  . Acute on chronic combined systolic and diastolic heart failure ([place]) 06/11/2016  . Palliative care encounter   . 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 Pulmonary hypertension   . GIB (gastrointestinal bleeding) 05/24/2016  . Encephalopathy, hepatic ([place]) 05/24/2016  . GI bleed 05/24/2016  . Abnormal alkaline phosphatase test   . Acute encephalopathy 04/22/2016  . Pruritus 03/22/2016  . Anemia of chronic disease 03/22/2016  . Chronic atrial fibrillation ([place]) 03/22/2016  . Type 2 diabetes mellitus with stage 4 chronic kidney disease,  with long-term current use of insulin ([place]) 03/22/2016  . CKD (chronic kidney disease), stage IV ([place]) 03/22/2016    Palliative Care Assessment & Plan   Patient Profile: 75 y.o. female  with past medical history of severe COPD, combined  systolic/diastolic heart failure EF 40-45%, pulmonary hypertension, chronic atrial fibrillation, CAD s/p CABG, diabetes mellitus, CKD IV, cryptogenic cirrhosis  admitted on 06/11/2016 with shortness of breath r/t underlying COPD with severe biventricular disease. She is requiring inotropic support, Lasix infusion, amiodarone infusion with continued volume overload.    Assessment: Sitting up in bed. No distress. Confused but pleasant.   Recommendations/Plan:  Volume overload: Tolerating on room air currently. Manage per heart failure team.   Wheezing: Per RT and primary team.   Itching: Continue Sarna lotion - she says this helps. Benadryl and Vistaril prn. Likely r/t cirrhosis.    With any respiratory decline I would opt for fentanyl or dilaudid to provide comfort over morphine d/t renal disease.   Goals of Care and Additional Recommendations:  Limitations on Scope of Treatment: Full Scope Treatment  Code Status:    Code Status Orders        Start     Ordered   06/11/16 1617  Full code  Continuous     06/11/16 1621    Code Status History    Date Active Date Inactive Code Status Order ID Comments User Context   05/24/2016  7:48 AM 06/04/2016  4:29 PM Full Code [id number]  Reyne Dumas, MD Inpatient   04/20/2016  9:19 PM 05/03/2016  7:07 PM Full Code [id number]  Edwin Dada, MD Inpatient   03/22/2016  3:02 AM 03/24/2016  8:19 PM Full Code [id number]  Norval Morton, MD ED       Prognosis:   < 2 weeks and could likely be days if shifting more to comfort care.   Discharge Planning:  To Be Determined  Care plan was discussed with Darrick Grinder, NP  Thank you for allowing the Palliative Medicine Team to assist in the care of this patient.   Time In: 1320 Time Out: 1400 Total Time 14min Prolonged Time Billed  no       Greater than 50%  of this time was spent counseling and coordinating care related to the above assessment and plan.  Pershing Proud, NP  Please contact  Palliative Medicine Team phone at [phone] for questions and concerns.

## 2016-06-19 NOTE — Progress Notes (Signed)
PROGRESS NOTE  Zetta Lukowski [id number] DOB: 01-24-1941 DOA: 06/11/2016 PCP: Marco Collie, MD  HPI/Recap of past 86 hours: 75 year old female past mental history of COPD, CAD and diabetes mellitus admitted on 9/26 5 days of progressively worsening shortness of breath and cough and found to have both COPD and CHF exacerbation. Patient was transferred to step down unit for placement of PICC line and starting of milrinone drip. Unfortunately, with her worsening heart failure, she has had little response to maximized therapy including amiodarone and Lasix continual infusions and milrinone. Palliative care was consulted and patient is being transitioned to comfort care. This has been somewhat limited as in discussion with the patient and her husband, she remains a full code, despite understanding what that means.  Patient today is more somnolent, unable to stay awake for very long.  Assessment/Plan: Principal Problem:   Acute on chronic combined systolic and diastolic heart failure [facility]): Status post right heart catheterization on 9/28. Patient found to have severely elevated biventricular pressures with moderate pulmonary venous hypertension and right-sided failure. Continue Lasix and amiodarone infusion. Metolazone infusion plus milrinone. Patient is being transitioned to comfort care. Active Problems:   Chronic atrial fibrillation [facility]): Chads 2 score of 6. Amiodarone drip. Plus beta blocker. Her recent admission for GI bleed so Coumadin was held.    Type 2 diabetes mellitus with stage 4 chronic kidney disease, with long-term current use of insulin ([place]): A1c last month at 7.4. On low-dose Lantus plus sliding scale   CKD (chronic kidney disease), stage IV ([place]): Creatinine around 3. Stable.   Encephalopathy, hepatic ([place]) with secondary Cryptogenic cirrhosis of liver ([place]): On lactulose and Xifaxan   COPD exacerbation ([place]): Continue on DuoNeb's plus Levaquin plus steroids and Tessalon Perles.  Immediate breathing stable. No acute respiratory failure at this time. Levaquin will finish tomorrow Pulmonary venous hypertension   Hyperkalemia   Respiratory distress   Gout   GERD (gastroesophageal reflux disease)   Essential hypertension   DNR (do not resuscitate) discussion   Code Status: Patient remains full code  Family Communication: Husband at the bedside   Disposition Plan: I do not anticipate that she will survive this hospitalization. She likely may pass within the next week    Consultants:  Cardiology  Palliative care   Gastroenterology  Procedures:  Right heart catheterization done 9/28  Antimicrobials:  Levaquin 9/26-10/5   DVT prophylaxis: SCDs   Objective: Vitals:   06/19/16 1400 06/19/16 1530 06/19/16 1539 06/19/16 1600  BP: (!) 138/59  120/63 (!) 120/52  Pulse: 90   (!) 103  Resp: 14   20  Temp:   98.3 F (36.8 C)   TempSrc:   Oral   SpO2: 100% 100%  99%  Weight:      Height:        Intake/Output Summary (Last 24 hours) at 06/19/16 1738 Last data filed at 06/19/16 1600  Gross per 24 hour  Intake          1349.03 ml  Output             3575 ml  Net         -2225.97 ml   Filed Weights   06/17/16 0400 06/18/16 0508 06/19/16 0459  Weight: 70.1 kg (154 lb 8.7 oz) 71.1 kg (156 lb 12 oz) 71 kg (156 lb 8.4 oz)    Exam:   General:  Somnolent   Cardiovascular: Irregular rhythm, rate controlled, 3/6 systolic ejection murmur   Respiratory: Poor inspiratory effort,  decreased breath sounds throughout   Abdomen: Soft, nontender, nondistended, positive bowel sounds   Musculoskeletal: No clubbing or cyanosis, 1+ pitting edema   Skin: No skin breaks, tears or lesions  Psychiatry: Somnolent    Data Reviewed: CBC:  Recent Labs Lab 06/15/16 0320 06/16/16 0335 06/17/16 0300 06/18/16 0345 06/19/16 0500  WBC 6.3 4.4 5.8 5.6 6.4  NEUTROABS 5.8 4.1 5.2 5.1 5.9  HGB 8.3* 8.4* 8.6* 8.2* 7.9*  HCT 27.1* 28.2* 28.4* 26.8* 25.8*  MCV  83.6 82.5 81.6 81.5 81.9  PLT 200 193 191 178 [id number]*   Basic Metabolic Panel:  Recent Labs Lab 06/15/16 0320 06/16/16 0335 06/17/16 0300 06/18/16 0345 06/19/16 0500  NA 134* 134* 133* 128* 124*  K 3.8 3.8 3.3* 3.4* 3.2*  CL 104 101 99* 93* 90*  CO2 20* 21* 19* 21* 21*  GLUCOSE 260* 257* 149* 335* 440*  BUN 93* 97* 97* 100* 98*  CREATININE 3.30* 3.21* 3.16* 3.15* 3.13*  CALCIUM 8.2* 8.6* 8.7* 8.9 8.5*   GFR: Estimated Creatinine Clearance: 14.3 mL/min (by C-G formula based on SCr of 3.13 mg/dL (H)). Liver Function Tests: No results for input(s): AST, ALT, ALKPHOS, BILITOT, PROT, ALBUMIN in the last 168 hours. No results for input(s): LIPASE, AMYLASE in the last 168 hours.  Recent Labs Lab 06/14/16 1250 06/18/16 1205  AMMONIA 42* 39*   Coagulation Profile: No results for input(s): INR, PROTIME in the last 168 hours. Cardiac Enzymes: No results for input(s): CKTOTAL, CKMB, CKMBINDEX, TROPONINI in the last 168 hours. BNP (last 3 results) No results for input(s): PROBNP in the last 8760 hours. HbA1C: No results for input(s): HGBA1C in the last 72 hours. CBG:  Recent Labs Lab 06/18/16 1143 06/18/16 1649 06/18/16 2141 06/19/16 0756 06/19/16 1133  GLUCAP 209* 200* 193* 184* 135*   Lipid Profile: No results for input(s): CHOL, HDL, LDLCALC, TRIG, CHOLHDL, LDLDIRECT in the last 72 hours. Thyroid Function Tests: No results for input(s): TSH, T4TOTAL, FREET4, T3FREE, THYROIDAB in the last 72 hours. Anemia Panel: No results for input(s): VITAMINB12, FOLATE, FERRITIN, TIBC, IRON, RETICCTPCT in the last 72 hours. Urine analysis:    Component Value Date/Time   COLORURINE YELLOW 05/25/2016 [place] 05/25/2016 1725   LABSPEC 1.014 05/25/2016 1725   PHURINE 6.0 05/25/2016 1725   GLUCOSEU NEGATIVE 05/25/2016 1725   HGBUR NEGATIVE 05/25/2016 1725   BILIRUBINUR NEGATIVE 05/25/2016 1725   KETONESUR NEGATIVE 05/25/2016 [place] 05/25/2016  1725   NITRITE NEGATIVE 05/25/2016 1725   LEUKOCYTESUR NEGATIVE 05/25/2016 1725   Sepsis Labs: @LABRCNTIP (procalcitonin:4,lacticidven:4)  ) Recent Results (from the past 240 hour(s))  MRSA PCR Screening     Status: None   Collection Time: 06/13/16  1:50 PM  Result Value Ref Range Status   MRSA by PCR NEGATIVE NEGATIVE Final    Comment:        The GeneXpert MRSA Assay (FDA approved for NASAL specimens only), is one component of a comprehensive MRSA colonization surveillance program. It is not intended to diagnose MRSA infection nor to guide or monitor treatment for MRSA infections.       Studies: No results found.  Scheduled Meds: . acetaZOLAMIDE  500 mg Oral BID  . allopurinol  100 mg Oral Daily  . benzonatate  100 mg Oral TID  . cholestyramine light  4 g Oral Q12H  . heparin  5,000 Units Subcutaneous Q8H  . hydrALAZINE  12.5 mg Oral Q8H  . insulin aspart  0-20 Units Subcutaneous  TID WC  . insulin aspart  0-5 Units Subcutaneous QHS  . insulin aspart  3 Units Subcutaneous TID WC  . insulin glargine  8 Units Subcutaneous BH-q7a  . ipratropium-albuterol  3 mL Nebulization TID  . isosorbide mononitrate  30 mg Oral Daily  . lactulose  30 g Oral TID  . levofloxacin  500 mg Oral Q48H  . methylPREDNISolone (SOLU-MEDROL) injection  60 mg Intravenous Q12H  . metolazone  5 mg Oral BID  . pantoprazole  40 mg Oral Daily  . rifaximin  400 mg Oral TID  . sodium chloride flush  10-40 mL Intracatheter Q12H  . sodium chloride flush  3 mL Intravenous Q12H    Continuous Infusions: . amiodarone 30 mg/hr (06/19/16 1100)  . furosemide (LASIX) infusion 30 mg/hr (06/19/16 0800)  . milrinone 0.125 mcg/kg/min (06/19/16 1115)     LOS: 8 days   Time spent: 25 minutes  Annita Brod, MD [facility] Pager [phone]  If 7PM-7AM, please contact night-coverage [email or web address] Password [facility] 06/19/2016, 5:38 PM

## 2016-06-20 LAB — GLUCOSE, CAPILLARY
GLUCOSE-CAPILLARY: 203 mg/dL — AB (ref 65–99)
GLUCOSE-CAPILLARY: 135 mg/dL — AB (ref 65–99)
Glucose-Capillary: 67 mg/dL (ref 65–99)
Glucose-Capillary: 220 mg/dL — ABNORMAL HIGH (ref 65–99)
Glucose-Capillary: 120 mg/dL — ABNORMAL HIGH (ref 65–99)

## 2016-06-20 LAB — CARBOXYHEMOGLOBIN
Carboxyhemoglobin: 2 % — ABNORMAL HIGH (ref 0.5–1.5)
Methemoglobin: 0.4 % (ref 0.0–1.5)
O2 SAT: 66.4 %
TOTAL HEMOGLOBIN: 8.8 g/dL — AB (ref 12.0–16.0)

## 2016-06-20 LAB — BASIC METABOLIC PANEL
Anion gap: 19 — ABNORMAL HIGH (ref 5–15)
BUN: 110 mg/dL — AB (ref 6–20)
CALCIUM: 8.9 mg/dL (ref 8.9–10.3)
CHLORIDE: 88 mmol/L — AB (ref 101–111)
CO2: 23 mmol/L (ref 22–32)
CREATININE: 3.37 mg/dL — AB (ref 0.44–1.00)
GFR, EST AFRICAN AMERICAN: 14 mL/min — AB (ref >60)
GFR, EST NON AFRICAN AMERICAN: 12 mL/min — AB (ref >60)
Glucose, Bld: 196 mg/dL — ABNORMAL HIGH (ref 65–99)
Potassium: 3.4 mmol/L — ABNORMAL LOW (ref 3.5–5.1)
SODIUM: 130 mmol/L — AB (ref 135–145)

## 2016-06-20 LAB — CBC WITH DIFFERENTIAL/PLATELET
BASOS PCT: 0 %
Basophils Absolute: 0 10*3/uL (ref 0.0–0.1)
EOS PCT: 0 %
Eosinophils Absolute: 0 10*3/uL (ref 0.0–0.7)
HEMATOCRIT: 28.5 % — AB (ref 36.0–46.0)
Hemoglobin: 8.8 g/dL — ABNORMAL LOW (ref 12.0–15.0)
LYMPHS ABS: 0.1 10*3/uL — AB (ref 0.7–4.0)
Lymphocytes Relative: 2 %
MCH: 24.8 pg — AB (ref 26.0–34.0)
MCHC: 30.9 g/dL (ref 30.0–36.0)
MCV: 80.3 fL (ref 78.0–100.0)
MONOS PCT: 6 %
Monocytes Absolute: 0.4 10*3/uL (ref 0.1–1.0)
Neutro Abs: 5.6 10*3/uL (ref 1.7–7.7)
Neutrophils Relative %: 92 %
Platelets: 153 10*3/uL (ref 150–400)
RBC: 3.55 MIL/uL — AB (ref 3.87–5.11)
RDW: 18.2 % — AB (ref 11.5–15.5)
WBC: 6.1 10*3/uL (ref 4.0–10.5)

## 2016-06-20 MED ORDER — INSULIN GLARGINE 100 UNIT/ML ~~LOC~~ SOLN
10.0000 [IU] | SUBCUTANEOUS | Status: DC
  Administered 2016-06-21 – 2016-06-22 (×2): 10 [IU] via SUBCUTANEOUS
  Filled 2016-06-20 (×2): qty 0.1

## 2016-06-20 MED ORDER — AMIODARONE HCL 200 MG PO TABS
200.0000 mg | ORAL_TABLET | Freq: Two times a day (BID) | ORAL | Status: DC
  Administered 2016-06-20 – 2016-06-24 (×10): 200 mg via ORAL
  Filled 2016-06-20 (×10): qty 1

## 2016-06-20 MED ORDER — POTASSIUM CHLORIDE CRYS ER 20 MEQ PO TBCR
40.0000 meq | EXTENDED_RELEASE_TABLET | Freq: Two times a day (BID) | ORAL | Status: DC
  Administered 2016-06-20 (×2): 40 meq via ORAL
  Filled 2016-06-20 (×2): qty 2

## 2016-06-20 NOTE — Progress Notes (Signed)
Patient ID: Cassandra Bonilla, female   DOB: [birth year], 75 y.o.   MRN: [id number]   Patient Name: Cassandra Bonilla Date of Encounter: 06/20/2016  Primary Cardiologist: Dr. Oval Linsey    Subjective   Chronically ill woman with severe COPD and mixed systolic/diastolic HF and pulmonary HTN. Underwent RHC which showed severely elevated biventricular pressures with low cardiac output and RV strain. Moved to SDU. PICC placed and milrinone started. Increased lasix.   Started weaning milrinone with Co-ox 66% this am on milrinone 0.125 mcg/kg/min +  amio drip at 30 mg per hour + lasix drip 30 mg per hour + metolazone.   Weight down 5 pounds. Creatinine trending up 3.1>3.4  Denies SOB. Confused.   [place] 06/13/16  RA = 30 RV = 68/26 PA = 69/25 (43) PCW = 33 Fick cardiac output/index = 3.7/2.1 Thermo CO/CI = 4.0/2.3 PVR = 2.5 WU Ao sat = 99% PA sat = 45%, 46%  Echo (9/17) with EF 40-45%, PASP 75, RV mildly dilated/mild-moderately decreased systolic function, severe TR.   Inpatient Medications    Scheduled Meds: . acetaZOLAMIDE  500 mg Oral BID  . allopurinol  100 mg Oral Daily  . benzonatate  100 mg Oral TID  . cholestyramine light  4 g Oral Q12H  . heparin  5,000 Units Subcutaneous Q8H  . hydrALAZINE  12.5 mg Oral Q8H  . insulin aspart  0-20 Units Subcutaneous TID WC  . insulin aspart  0-5 Units Subcutaneous QHS  . insulin aspart  3 Units Subcutaneous TID WC  . insulin glargine  8 Units Subcutaneous BH-q7a  . ipratropium-albuterol  3 mL Nebulization TID  . isosorbide mononitrate  30 mg Oral Daily  . lactulose  30 g Oral TID  . levofloxacin  500 mg Oral Q48H  . methylPREDNISolone (SOLU-MEDROL) injection  60 mg Intravenous Q12H  . metolazone  5 mg Oral BID  . pantoprazole  40 mg Oral Daily  . rifaximin  400 mg Oral TID  . sodium chloride flush  10-40 mL Intracatheter Q12H  . sodium chloride flush  3 mL Intravenous Q12H   Continuous Infusions: . amiodarone 30 mg/hr (06/20/16 0200)  .  furosemide (LASIX) infusion 30 mg/hr (06/20/16 0200)  . milrinone 0.125 mcg/kg/min (06/19/16 1900)   PRN Meds:.sodium chloride, sodium chloride, acetaminophen, albuterol, camphor-menthol, diphenhydrAMINE, glycopyrrolate, guaiFENesin-codeine, hydrOXYzine, LORazepam, metoprolol, ondansetron **OR** ondansetron (ZOFRAN) IV, sodium chloride flush, sodium chloride flush   Vital Signs    Vitals:   06/19/16 1948 06/20/16 0000 06/20/16 0400 06/20/16 0550  BP: 139/62 (!) 145/56 122/77   Pulse: (!) 108 (!) 106 98   Resp: 20 17 14    Temp: 98.3 F (36.8 C) 98.3 F (36.8 C) 97.9 F (36.6 C)   TempSrc: Oral Oral Oral   SpO2: 95% 100% 96%   Weight:    151 lb 10.8 oz (68.8 kg)  Height:        Intake/Output Summary (Last 24 hours) at 06/20/16 0746 Last data filed at 06/20/16 0600  Gross per 24 hour  Intake          1963.83 ml  Output             3350 ml  Net         -1386.17 ml   Filed Weights   06/18/16 0508 06/19/16 0459 06/20/16 0550  Weight: 156 lb 12 oz (71.1 kg) 156 lb 8.4 oz (71 kg) 151 lb 10.8 oz (68.8 kg)    Physical Exam  CVP 20 GEN: Elderly  and chronically ill appearing.  In bed  HEENT: Normal Neck: Supple, JVP to jaw, carotid bruits, or masses. Cardiac: RRR, no murmurs, rubs, or gallops. No clubbing, cyanosis, edema.  Radials/DP/PT 2+ and equal bilaterally.  Respiratory:  Clear.   GI: Soft, NT, ND, no HSM. No bruits or masses. +BS  MS: no deformity or atrophy. RUE PICC  Skin: warm and dry, no rash. Neuro:  Alert. Mildly confused   Labs    CBC  Recent Labs  06/19/16 0500 06/20/16 0615  WBC 6.4 6.1  NEUTROABS 5.9 PENDING  HGB 7.9* 8.8*  HCT 25.8* 28.5*  MCV 81.9 80.3  PLT 136* PENDING   Basic Metabolic Panel  Recent Labs  06/19/16 0500 06/20/16 0615  NA 124* 130*  K 3.2* 3.4*  CL 90* 88*  CO2 21* 23  GLUCOSE 440* 196*  BUN 98* 110*  CREATININE 3.13* 3.37*  CALCIUM 8.5* 8.9   Liver Function Tests No results for input(s): AST, ALT, ALKPHOS, BILITOT,  PROT, ALBUMIN in the last 72 hours. Cardiac Enzymes No results for input(s): CKTOTAL, CKMB, CKMBINDEX, TROPONINI in the last 72 hours.  Telemetry     atrial fibrillation- Personally Reviewed   Radiology    No results found.   Cardiac Studies   Transthoracic Echocardiography 05/26/16 Study Conclusions  - Left ventricle: The cavity size was normal. Wall thickness was   increased in a pattern of mild LVH. Systolic function was mildly   to moderately reduced. The estimated ejection fraction was in the   range of 40% to 45%. - Ventricular septum: The contour showed diastolic flattening and   systolic flattening. - Aortic valve: Severely calcified annulus. - Mitral valve: There was mild to moderate regurgitation. - Left atrium: The atrium was moderately dilated. - Right ventricle: The cavity size was mildly dilated. Systolic   function was mildly to moderately reduced. - Right atrium: The atrium was mildly dilated. - Tricuspid valve: There was severe regurgitation. - Pulmonary arteries: Systolic pressure was severely increased. PA   peak pressure: 75 mm Hg (S).  Patient Profile  75 yo female with PMH of chronic systolic and diastolic heart failure (LVEF 40-45%), chronic atrial fibrillation COPD, carotid stenosis s/p CEA, CAD s/p CABG, diabetes mellitus, CKD 4, GI bleed, cryptogenic cirrhosis, and severe pulmonary hypertension who presented with worsening dyspnea.      Assessment   1. Acute on chronic combined biventricular failure: Likely end stage 2. Pulmonary HTN: Looks like primarily pulmonary venous hypertension.  3. Severe COPD 4. AKI on CKD IV 5. Hx of hepatic encephalopathy and cryptogenic cirrhosis 6. Chronic atrial fibrillation.   Plan    PICC placed and started on milrinone with elevated filling pressures, low output, and marked volume overload.      CVP down to 20 from 28. Weight down 5 pounds. Continue lasix drip + metolazone. Would like to get as dry as  possible. Anticipate switching to torsemide in the next day or so.  Continue milrinone 0.125 mcg. CO-OX 66%. Supplement K.   On levaquin 3/5 doses for COPD exacerbation.  On prednisone and nebs, still wheezing/rhonchorous.   Continue lactulose for hepatic encephalopathy. Most recent Ammonia 39.    Chronic atrial fibrillation but HR better controlled 90s.  Stop amio drip and start amio 200 mg twice a day.     Prognosis very tenuous. 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    Palliative Care appreciated.  Needs Hospice.   OOB today  Signed, Darrick Grinder, NP-C 06/20/2016, 7:46 AM   Advanced Heart Failure Team Pager [phone] (M-F; 7a - 4p)  Please contact [facility] for night-coverage after hours (4p -7a ) and weekends on [email or web address]  Patient seen and examined with Darrick Grinder, NP. We discussed all aspects of the encounter. I agree with the assessment and plan as stated above.   Milrinone cut back yesterday. Co-ox remains ok. Volume status actually improving but CVP still high. Creatinine worse. I think she is nearing the end of her life but she has not been willing to engage meaning fully with Hospice discussions. Will continue diuresis for now. Plan to likely stop milrinone tomorrow and follow. Agree with switching amio to po. Treatment of COPD flare per [facility].   Bensimhon, Daniel,MD 9:37 PM

## 2016-06-20 NOTE — Progress Notes (Signed)
Physical Therapy Discharge Patient Details Name: Cassandra Bonilla MRN: [id number] DOB: 06-10-1941 Today's Date: 06/20/2016 Time:  -     Patient discharged from PT services secondary to medical decline -noted Palliative Care notes and <2 week prognosis.  Please see latest therapy progress note for current level of functioning and progress toward goals.    Progress and discharge plan discussed with patient and/or caregiver: -NA  GP     Adriena Manfre  Pager [phone]  06/20/2016, 7:42 AM

## 2016-06-20 NOTE — Progress Notes (Signed)
PROGRESS NOTE  Cassandra Bonilla [id number] DOB: [birth year] DOA: 06/11/2016 PCP: Marco Collie, MD  HPI/Recap of past 12 hours: 75 year old female past mental history of COPD, CAD and diabetes mellitus admitted on 9/26 5 days of progressively worsening shortness of breath and cough and found to have both COPD and CHF exacerbation. Patient was transferred to step down unit for placement of PICC line and starting of milrinone drip. Unfortunately, with her worsening heart failure, she has had little response to maximized therapy including amiodarone and Lasix continual infusions and milrinone. Palliative care was consulted and patient is being transitioned to comfort care. This has been somewhat limited as in discussion with the patient and her husband, she remains a full code, despite understanding what that means.  Overnight no issues, other than hyperglycemia. This morning, creatinine trending upwards. Patient continues to diurese. she remains somnolent.   Assessment/Plan: Principal Problem:   Acute on chronic combined systolic and diastolic heart failure [facility]): Status post right heart catheterization on 9/28. Patient found to have severely elevated biventricular pressures with moderate pulmonary venous hypertension and right-sided failure. Continue Lasix and amiodarone infusion. Metolazone infusion plus milrinone. Despite aggressive measures, prognosis remains very poor. Palliative care has been consulted. His expected that she will not arrived the next few Active Problems:   Chronic atrial fibrillation [facility]): Chads 2 score of 6. Amiodarone drip. Plus beta blocker. Her recent admission for GI bleed so Coumadin was held.    Type 2 diabetes mellitus with stage 4 chronic kidney disease, with long-term current use of insulin ([place]): A1c last month at 7.4. On low-dose Lantus plus sliding scale.  Given episodes of hyperglycemia seen on basic metabolic panel, will try titrating up Lantus very slightly.   CKD (chronic kidney disease), stage IV ([place]): Creatinine was staying stable right above 3, trending up more today.    Encephalopathy, hepatic ([place]) with secondary Cryptogenic cirrhosis of liver ([place]): On lactulose and Xifaxan    COPD exacerbation ([place]): Continue on DuoNeb's plus Levaquin plus steroids and Tessalon Perles. Still with persistent wheezing. No acute respiratory failure at this time. Levaquin completed today. Pulmonary venous hypertension    Code Status: Patient remains full code  Family Communication: Left message with husband. Spoke with him in person yesterday.  Disposition Plan: I do not anticipate that she will survive this hospitalization. She likely may pass within the 1-2 weeks   Consultants:  Cardiology  Palliative care   Gastroenterology  Procedures:  Right heart catheterization done 9/28  Antimicrobials:  Levaquin 9/26-10/5   DVT prophylaxis: SCDs   Objective: Vitals:   06/20/16 0400 06/20/16 0550 06/20/16 0749 06/20/16 0830  BP: 122/77  (!) 141/73   Pulse: 98     Resp: 14     Temp: 97.9 F (36.6 C)  97.4 F (36.3 C)   TempSrc: Oral  Oral   SpO2: 96%   100%  Weight:  68.8 kg (151 lb 10.8 oz)    Height:        Intake/Output Summary (Last 24 hours) at 06/20/16 1011 Last data filed at 06/20/16 0600  Gross per 24 hour  Intake          1486.63 ml  Output             2925 ml  Net         -1438.37 ml   Filed Weights   06/18/16 0508 06/19/16 0459 06/20/16 0550  Weight: 71.1 kg (156 lb 12 oz) 71 kg (156 lb 8.4 oz) 68.8  kg (151 lb 10.8 oz)    Exam:   General:  Somnolent   Cardiovascular: Irregular rhythm, Borderline tachycardia, 3/6 systolic ejection murmur   Respiratory: Poor inspiratory effort, Some wheezing, decreased breath sounds throughout   Abdomen: Soft, nontender, nondistended, positive bowel sounds   Musculoskeletal: No clubbing or cyanosis, 1+ pitting edema   Skin: No skin breaks, tears or lesions  Psychiatry:  Somnolent    Data Reviewed: CBC:  Recent Labs Lab 06/16/16 0335 06/17/16 0300 06/18/16 0345 06/19/16 0500 06/20/16 0615  WBC 4.4 5.8 5.6 6.4 6.1  NEUTROABS 4.1 5.2 5.1 5.9 5.6  HGB 8.4* 8.6* 8.2* 7.9* 8.8*  HCT 28.2* 28.4* 26.8* 25.8* 28.5*  MCV 82.5 81.6 81.5 81.9 80.3  PLT 193 191 178 136* 0000000   Basic Metabolic Panel:  Recent Labs Lab 06/16/16 0335 06/17/16 0300 06/18/16 0345 06/19/16 0500 06/20/16 0615  NA 134* 133* 128* 124* 130*  K 3.8 3.3* 3.4* 3.2* 3.4*  CL 101 99* 93* 90* 88*  CO2 21* 19* 21* 21* 23  GLUCOSE 257* 149* 335* 440* 196*  BUN 97* 97* 100* 98* 110*  CREATININE 3.21* 3.16* 3.15* 3.13* 3.37*  CALCIUM 8.6* 8.7* 8.9 8.5* 8.9   GFR: Estimated Creatinine Clearance: 13.1 mL/min (by C-G formula based on SCr of 3.37 mg/dL (H)). Liver Function Tests: No results for input(s): AST, ALT, ALKPHOS, BILITOT, PROT, ALBUMIN in the last 168 hours. No results for input(s): LIPASE, AMYLASE in the last 168 hours.  Recent Labs Lab 06/14/16 1250 06/18/16 1205  AMMONIA 42* 39*   Coagulation Profile: No results for input(s): INR, PROTIME in the last 168 hours. Cardiac Enzymes: No results for input(s): CKTOTAL, CKMB, CKMBINDEX, TROPONINI in the last 168 hours. BNP (last 3 results) No results for input(s): PROBNP in the last 8760 hours. HbA1C: No results for input(s): HGBA1C in the last 72 hours. CBG:  Recent Labs Lab 06/18/16 2141 06/19/16 0756 06/19/16 1133 06/19/16 2109 06/20/16 0747  GLUCAP 193* 184* 135* 142* 203*   Lipid Profile: No results for input(s): CHOL, HDL, LDLCALC, TRIG, CHOLHDL, LDLDIRECT in the last 72 hours. Thyroid Function Tests: No results for input(s): TSH, T4TOTAL, FREET4, T3FREE, THYROIDAB in the last 72 hours. Anemia Panel: No results for input(s): VITAMINB12, FOLATE, FERRITIN, TIBC, IRON, RETICCTPCT in the last 72 hours. Urine analysis:    Component Value Date/Time   COLORURINE YELLOW 05/25/2016 [place]  05/25/2016 1725   LABSPEC 1.014 05/25/2016 1725   PHURINE 6.0 05/25/2016 1725   GLUCOSEU NEGATIVE 05/25/2016 1725   HGBUR NEGATIVE 05/25/2016 1725   BILIRUBINUR NEGATIVE 05/25/2016 1725   KETONESUR NEGATIVE 05/25/2016 [place] 05/25/2016 1725   NITRITE NEGATIVE 05/25/2016 1725   LEUKOCYTESUR NEGATIVE 05/25/2016 1725   Sepsis Labs: @LABRCNTIP (procalcitonin:4,lacticidven:4)  ) Recent Results (from the past 240 hour(s))  MRSA PCR Screening     Status: None   Collection Time: 06/13/16  1:50 PM  Result Value Ref Range Status   MRSA by PCR NEGATIVE NEGATIVE Final    Comment:        The GeneXpert MRSA Assay (FDA approved for NASAL specimens only), is one component of a comprehensive MRSA colonization surveillance program. It is not intended to diagnose MRSA infection nor to guide or monitor treatment for MRSA infections.       Studies: No results found.  Scheduled Meds: . acetaZOLAMIDE  500 mg Oral BID  . allopurinol  100 mg Oral Daily  . amiodarone  200 mg Oral BID  .  benzonatate  100 mg Oral TID  . cholestyramine light  4 g Oral Q12H  . heparin  5,000 Units Subcutaneous Q8H  . hydrALAZINE  12.5 mg Oral Q8H  . insulin aspart  0-20 Units Subcutaneous TID WC  . insulin aspart  0-5 Units Subcutaneous QHS  . insulin aspart  3 Units Subcutaneous TID WC  . insulin glargine  8 Units Subcutaneous BH-q7a  . ipratropium-albuterol  3 mL Nebulization TID  . isosorbide mononitrate  30 mg Oral Daily  . lactulose  30 g Oral TID  . levofloxacin  500 mg Oral Q48H  . methylPREDNISolone (SOLU-MEDROL) injection  60 mg Intravenous Q12H  . metolazone  5 mg Oral BID  . pantoprazole  40 mg Oral Daily  . potassium chloride  40 mEq Oral BID  . rifaximin  400 mg Oral TID  . sodium chloride flush  10-40 mL Intracatheter Q12H  . sodium chloride flush  3 mL Intravenous Q12H    Continuous Infusions: . furosemide (LASIX) infusion 30 mg/hr (06/20/16 0200)  . milrinone 0.125  mcg/kg/min (06/19/16 1900)     LOS: 9 days   Time spent: 25 minutes  Annita Brod, MD [facility] Pager [phone]  If 7PM-7AM, please contact night-coverage [email or web address] Password [email or web address] 06/20/2016, 10:11 AM

## 2016-06-20 NOTE — Progress Notes (Addendum)
Daily Progress Note   Patient Name: Cassandra Bonilla       Date: 06/20/2016 DOB: 12/21/1940  Age: 75 y.o. MRN#: [id number] Attending Physician: Annita Brod, MD Primary Care Physician: Marco Collie, MD Admit Date: 06/11/2016  Reason for Consultation/Follow-up: Establishing goals of care   Subjective: I met again with Cassandra Bonilla and husband [name]. He continues to understand that she is approaching EOL and is agreeable to utilizing hospice. However, he does ask about medications and she continues to be more lethargic. She does awaken briefly and smile and greet me but falls back asleep. Was able to eat breakfast this morning. They are awaiting children to visit this weekend. Continues to be full code - we have discussed this extensively. I am hoping when children come and are able to see her that they will be more on board with DNR given they fact that she continues to decline with aggressive treatment. Emotional support provided.   Length of Stay: 9  Current Medications: Scheduled Meds:  . acetaZOLAMIDE  500 mg Oral BID  . allopurinol  100 mg Oral Daily  . amiodarone  200 mg Oral BID  . benzonatate  100 mg Oral TID  . cholestyramine light  4 g Oral Q12H  . heparin  5,000 Units Subcutaneous Q8H  . hydrALAZINE  12.5 mg Oral Q8H  . insulin aspart  0-20 Units Subcutaneous TID WC  . insulin aspart  0-5 Units Subcutaneous QHS  . insulin aspart  3 Units Subcutaneous TID WC  . [START ON 06/21/2016] insulin glargine  10 Units Subcutaneous BH-q7a  . ipratropium-albuterol  3 mL Nebulization TID  . isosorbide mononitrate  30 mg Oral Daily  . lactulose  30 g Oral TID  . levofloxacin  500 mg Oral Q48H  . methylPREDNISolone (SOLU-MEDROL) injection  60 mg Intravenous Q12H  . metolazone  5 mg Oral BID    . pantoprazole  40 mg Oral Daily  . potassium chloride  40 mEq Oral BID  . rifaximin  400 mg Oral TID  . sodium chloride flush  10-40 mL Intracatheter Q12H  . sodium chloride flush  3 mL Intravenous Q12H    Continuous Infusions: . furosemide (LASIX) infusion 30 mg/hr (06/20/16 1057)  . milrinone 0.125 mcg/kg/min (06/20/16 1153)    PRN Meds: sodium chloride, sodium chloride, acetaminophen, albuterol,  camphor-menthol, diphenhydrAMINE, glycopyrrolate, guaiFENesin-codeine, hydrOXYzine, LORazepam, metoprolol, ondansetron **OR** ondansetron (ZOFRAN) IV, sodium chloride flush, sodium chloride flush  Physical Exam  Constitutional: She appears well-developed. She appears lethargic.  HENT:  Head: Normocephalic and atraumatic.  Cardiovascular: An irregularly irregular rhythm present. Tachycardia present.   Pulmonary/Chest: No accessory muscle usage. No tachypnea. No respiratory distress. She has wheezes. She has rhonchi. She has rales.  Abdominal: Soft. Normal appearance.  Neurological: She appears lethargic. She is disoriented.            Vital Signs: BP (!) 132/55 (BP Location: Left Arm)   Pulse 97   Temp 97.4 F (36.3 C) (Oral)   Resp 13   Ht _0  (1.575 m)   Wt 68.8 kg (151 lb 10.8 oz)   SpO2 100%   BMI 27.74 kg/m  SpO2: SpO2: 100 % O2 Device: O2 Device: Not Delivered O2 Flow Rate:    Intake/output summary:   Intake/Output Summary (Last 24 hours) at 06/20/16 1313 Last data filed at 06/20/16 1255  Gross per 24 hour  Intake           1948.1 ml  Output             3875 ml  Net          -1926.9 ml   LBM: Last BM Date: 06/19/16 (smear) Baseline Weight: Weight: 73.9 kg (162 lb 14.7 oz) Most recent weight: Weight: 68.8 kg (151 lb 10.8 oz)       Palliative Assessment/Data: PPS: 20%   Flowsheet Rows   Flowsheet Row Most Recent Value  Intake Tab  Referral Department  Hospitalist  Unit at Time of Referral  ICU  Palliative Care Primary Diagnosis  Cardiac  Date Notified   06/15/16  Palliative Care Type  Return patient Palliative Care  Reason for referral  Clarify Goals of Care  Date of Admission  06/11/16  Date first seen by Palliative Care  06/17/16  # of days Palliative referral response time  2 Day(s)  # of days IP prior to Palliative referral  4  Clinical Assessment  Psychosocial & Spiritual Assessment  Palliative Care Outcomes      Patient Active Problem List   Diagnosis Date Noted  . DNR (do not resuscitate) discussion   . Gout 06/12/2016  . GERD (gastroesophageal reflux disease) 06/12/2016  . Essential hypertension 06/12/2016  . COPD exacerbation ([place]) 06/11/2016  . Hyperkalemia 06/11/2016  . Respiratory distress 06/11/2016  . Acute on chronic combined systolic and diastolic heart failure ([place]) 06/11/2016  . Palliative care encounter   . 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 Pulmonary hypertension   . GIB (gastrointestinal bleeding) 05/24/2016  . Encephalopathy, hepatic ([place]) 05/24/2016  . GI bleed 05/24/2016  . Abnormal alkaline phosphatase test   . Acute encephalopathy 04/22/2016  . Pruritus 03/22/2016  . Anemia of chronic disease 03/22/2016  . Chronic atrial fibrillation ([place]) 03/22/2016  . Type 2 diabetes mellitus with stage 4 chronic kidney disease, with long-term current use of insulin ([place]) 03/22/2016  . CKD (chronic kidney disease), stage IV ([place]) 03/22/2016    Palliative Care Assessment & Plan   Patient Profile: 75 y.o. female  with past medical history of severe COPD, combined systolic/diastolic heart failure EF 40-45%, pulmonary hypertension, chronic atrial fibrillation, CAD s/p CABG, diabetes mellitus, CKD IV, cryptogenic cirrhosis  admitted on 06/11/2016 with shortness of breath r/t underlying COPD with severe biventricular disease. She is requiring inotropic support, Lasix infusion, amiodarone infusion with continued volume overload.  Assessment: Sitting up in bed. No distress. Confused but pleasant.    Recommendations/Plan:  Volume overload: Tolerating on room air currently. Manage per heart failure team.   Wheezing: Per RT and primary team.   Itching: Continue Sarna lotion - she says this helps. Benadryl and Vistaril prn. Likely r/t cirrhosis.    With any respiratory decline I would opt for fentanyl or dilaudid to provide comfort over morphine d/t renal disease.   Secretions: Seems more congested. Robinul prn.   Goals of Care and Additional Recommendations:  Limitations on Scope of Treatment: Full Scope Treatment  Code Status:    Code Status Orders        Start     Ordered   06/11/16 1617  Full code  Continuous     06/11/16 1621    Code Status History    Date Active Date Inactive Code Status Order ID Comments User Context   05/24/2016  7:48 AM 06/04/2016  4:29 PM Full Code [id number]  Reyne Dumas, MD Inpatient   04/20/2016  9:19 PM 05/03/2016  7:07 PM Full Code [id number]  Edwin Dada, MD Inpatient   03/22/2016  3:02 AM 03/24/2016  8:19 PM Full Code [id number]  Norval Morton, MD ED       Prognosis:   < 2 weeks and could likely be days if shifting more to comfort care.   Discharge Planning:  To Be Determined    Thank you for allowing the Palliative Medicine Team to assist in the care of this patient.   Time In: 1130 Time Out: 1150 Total Time 33mn Prolonged Time Billed  no       Greater than 50%  of this time was spent counseling and coordinating care related to the above assessment and plan.  PPershing Proud NP  Please contact Palliative Medicine Team phone at [phone] questions and concerns.

## 2016-06-21 LAB — GLUCOSE, CAPILLARY
GLUCOSE-CAPILLARY: 166 mg/dL — AB (ref 65–99)
Glucose-Capillary: 188 mg/dL — ABNORMAL HIGH (ref 65–99)
Glucose-Capillary: 120 mg/dL — ABNORMAL HIGH (ref 65–99)
Glucose-Capillary: 161 mg/dL — ABNORMAL HIGH (ref 65–99)

## 2016-06-21 LAB — BASIC METABOLIC PANEL
Anion gap: 15 (ref 5–15)
BUN: 112 mg/dL — ABNORMAL HIGH (ref 6–20)
CALCIUM: 8.4 mg/dL — AB (ref 8.9–10.3)
CO2: 23 mmol/L (ref 22–32)
CREATININE: 3.38 mg/dL — AB (ref 0.44–1.00)
Chloride: 89 mmol/L — ABNORMAL LOW (ref 101–111)
GFR calc Af Amer: 14 mL/min — ABNORMAL LOW (ref >60)
GFR, EST NON AFRICAN AMERICAN: 12 mL/min — AB (ref >60)
GLUCOSE: 250 mg/dL — AB (ref 65–99)
Potassium: 3.1 mmol/L — ABNORMAL LOW (ref 3.5–5.1)
Sodium: 127 mmol/L — ABNORMAL LOW (ref 135–145)

## 2016-06-21 LAB — CARBOXYHEMOGLOBIN
Carboxyhemoglobin: 1.9 % — ABNORMAL HIGH (ref 0.5–1.5)
Methemoglobin: 0.7 % (ref 0.0–1.5)
O2 Saturation: 68.2 %
Total hemoglobin: 8.5 g/dL — ABNORMAL LOW (ref 12.0–16.0)

## 2016-06-21 MED ORDER — ORAL CARE MOUTH RINSE
15.0000 mL | Freq: Two times a day (BID) | OROMUCOSAL | Status: DC
  Administered 2016-06-21 – 2016-06-27 (×12): 15 mL via OROMUCOSAL

## 2016-06-21 MED ORDER — LACTULOSE 10 GM/15ML PO SOLN: 30.0000 g | Freq: Two times a day (BID) | ORAL | Status: DC | PRN

## 2016-06-21 MED ORDER — POTASSIUM CHLORIDE 10 MEQ/100ML IV SOLN
10.0000 meq | INTRAVENOUS | Status: AC
Start: 2016-06-21 — End: 2016-06-21
  Administered 2016-06-21 (×4): 10 meq via INTRAVENOUS
  Filled 2016-06-21 (×4): qty 100

## 2016-06-21 NOTE — Progress Notes (Signed)
Patient ID: Sitara Burhop, female   DOB: Dec 11, 1940, 75 y.o.   MRN: [id number]   Patient Name: Cassandra Bonilla Date of Encounter: 06/21/2016  Primary Cardiologist: Dr. Oval Linsey    Subjective   Chronically ill woman with severe COPD and mixed systolic/diastolic HF and pulmonary HTN. Underwent RHC which showed severely elevated biventricular pressures with low cardiac output and RV strain. Moved to SDU. PICC placed and milrinone started. Increased lasix.   Todays  Co-ox 68% on milrinone 0.125 mcg/kg/min. Continue lasix drip 30 mg per hour + metolazone.  CVP still eelvated but increased urine output noted.  Weight down 7 pounds. Creatinine trending up 3.1>3.4>3.38.   Received ativan last night. Lethargic this morning. Marland Kitchen   [place] 06/13/16 RA = 30 RV = 68/26 PA = 69/25 (43) PCW = 33 Fick cardiac output/index = 3.7/2.1 Thermo CO/CI = 4.0/2.3 PVR = 2.5 WU Ao sat = 99% PA sat = 45%, 46%  Echo (9/17) with EF 40-45%, PASP 75, RV mildly dilated/mild-moderately decreased systolic function, severe TR.   Inpatient Medications    Scheduled Meds: . acetaZOLAMIDE  500 mg Oral BID  . allopurinol  100 mg Oral Daily  . amiodarone  200 mg Oral BID  . benzonatate  100 mg Oral TID  . cholestyramine light  4 g Oral Q12H  . heparin  5,000 Units Subcutaneous Q8H  . hydrALAZINE  12.5 mg Oral Q8H  . insulin aspart  0-20 Units Subcutaneous TID WC  . insulin aspart  0-5 Units Subcutaneous QHS  . insulin aspart  3 Units Subcutaneous TID WC  . insulin glargine  10 Units Subcutaneous BH-q7a  . ipratropium-albuterol  3 mL Nebulization TID  . isosorbide mononitrate  30 mg Oral Daily  . lactulose  30 g Oral TID  . levofloxacin  500 mg Oral Q48H  . methylPREDNISolone (SOLU-MEDROL) injection  60 mg Intravenous Q12H  . metolazone  5 mg Oral BID  . pantoprazole  40 mg Oral Daily  . potassium chloride  10 mEq Intravenous Q1 Hr x 4  . rifaximin  400 mg Oral TID  . sodium chloride flush  10-40 mL Intracatheter Q12H    . sodium chloride flush  3 mL Intravenous Q12H   Continuous Infusions: . furosemide (LASIX) infusion 30 mg/hr (06/21/16 0353)  . milrinone 0.125 mcg/kg/min (06/20/16 2000)   PRN Meds:.sodium chloride, sodium chloride, acetaminophen, albuterol, camphor-menthol, diphenhydrAMINE, glycopyrrolate, guaiFENesin-codeine, hydrOXYzine, LORazepam, metoprolol, ondansetron **OR** ondansetron (ZOFRAN) IV, sodium chloride flush, sodium chloride flush   Vital Signs    Vitals:   06/21/16 0355 06/21/16 0410 06/21/16 0619 06/21/16 0625  BP:  (!) 125/49 134/68   Pulse:  (!) 104    Resp:  15    Temp: 98.5 F (36.9 C)     TempSrc: Oral     SpO2:  100% 100%   Weight:    144 lb (65.3 kg)  Height:        Intake/Output Summary (Last 24 hours) at 06/21/16 0737 Last data filed at 06/21/16 0705  Gross per 24 hour  Intake           1633.7 ml  Output             3950 ml  Net          -2316.3 ml   Filed Weights   06/19/16 0459 06/20/16 0550 06/21/16 0625  Weight: 156 lb 8.4 oz (71 kg) 151 lb 10.8 oz (68.8 kg) 144 lb (65.3 kg)    Physical Exam  CVP 23 GEN: Elderly and chronically ill appearing.  In bed  HEENT: Normal Neck: Supple, JVP to jaw, carotid bruits, or masses. Cardiac: RRR, no murmurs, rubs, or gallops. No clubbing, cyanosis, edema.  Radials/DP/PT 2+ and equal bilaterally.  Respiratory:  Clear.   GI: Soft, NT, ND, no HSM. No bruits or masses. +BS  MS: no deformity or atrophy. RUE PICC  Skin: warm and dry, no rash. Neuro:  Lethargic   Labs    CBC  Recent Labs  06/19/16 0500 06/20/16 0615  WBC 6.4 6.1  NEUTROABS 5.9 5.6  HGB 7.9* 8.8*  HCT 25.8* 28.5*  MCV 81.9 80.3  PLT 136* 0000000   Basic Metabolic Panel  Recent Labs  06/20/16 0615 06/21/16 0421  NA 130* 127*  K 3.4* 3.1*  CL 88* 89*  CO2 23 23  GLUCOSE 196* 250*  BUN 110* 112*  CREATININE 3.37* 3.38*  CALCIUM 8.9 8.4*   Liver Function Tests No results for input(s): AST, ALT, ALKPHOS, BILITOT, PROT, ALBUMIN in the  last 72 hours. Cardiac Enzymes No results for input(s): CKTOTAL, CKMB, CKMBINDEX, TROPONINI in the last 72 hours.  Telemetry     atrial fibrillation- Personally Reviewed   Radiology    No results found.   Cardiac Studies   Transthoracic Echocardiography 05/26/16 Study Conclusions  - Left ventricle: The cavity size was normal. Wall thickness was   increased in a pattern of mild LVH. Systolic function was mildly   to moderately reduced. The estimated ejection fraction was in the   range of 40% to 45%. - Ventricular septum: The contour showed diastolic flattening and   systolic flattening. - Aortic valve: Severely calcified annulus. - Mitral valve: There was mild to moderate regurgitation. - Left atrium: The atrium was moderately dilated. - Right ventricle: The cavity size was mildly dilated. Systolic   function was mildly to moderately reduced. - Right atrium: The atrium was mildly dilated. - Tricuspid valve: There was severe regurgitation. - Pulmonary arteries: Systolic pressure was severely increased. PA   peak pressure: 75 mm Hg (S).  Patient Profile  75 yo female with PMH of chronic systolic and diastolic heart failure (LVEF 40-45%), chronic atrial fibrillation COPD, carotid stenosis s/p CEA, CAD s/p CABG, diabetes mellitus, CKD 4, GI bleed, cryptogenic cirrhosis, and severe pulmonary hypertension who presented with worsening dyspnea.      Assessment   1. Acute on chronic combined biventricular failure: Likely end stage 2. Pulmonary HTN: Looks like primarily pulmonary venous hypertension.  3. Severe COPD 4. AKI on CKD IV 5. Hx of hepatic encephalopathy and cryptogenic cirrhosis 6. Chronic atrial fibrillation.   Plan    PICC placed and started on milrinone with elevated filling pressures, low output, and marked volume overload.      CVP up to 23-24 but with increased urine output. Weight down another 7 pounds. Continue lasix drip + metolazone. Would like to get as  dry as possible. Anticipate switching to torsemide in the next day or so.  Continue milrinone 0.125 mcg. CO-OX 66%. Supplement K.   On levaquin day 5/5 doses for COPD exacerbation.  On prednisone and nebs, still wheezing/rhonchorous.   Continue lactulose for hepatic encephalopathy. Most recent Ammonia 39.    Chronic atrial fibrillation but HR better controlled 90s.  Stop amio drip and start amio 200 mg twice a day.     Prognosis very tenuous. 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  Palliative Care appreciated.  Needs Hospice.   OOB today  Signed, Darrick Grinder, NP-C 06/21/2016, 7:37 AM   Advanced Heart Failure Team Pager [phone] (M-F; 7a - 4p)  Please contact [facility] for night-coverage after hours (4p -7a ) and weekends on [email or web address]  Patient seen and examined with Darrick Grinder, NP. We discussed all aspects of the encounter. I agree with the assessment and plan as stated above.   Remains very tenuous. Continues to diurese on low-dose milrinone and IV lasix. Renal function relatively stable. Co-ox ok. She appears to be growing weaker though mental status hard to accurately assess due to her getting ativan overnight. AF rate better controlled on amio.   Will continue milrinone and IV diuresis for now. If she continues to decline may need to reconsider full comfort care though she has declined that previously this admission.   Bensimhon, Daniel,MD 7:51 PM

## 2016-06-21 NOTE — Progress Notes (Signed)
PROGRESS NOTE  Cassandra Bonilla [id number] DOB: Jun 13, 1941 DOA: 06/11/2016 PCP: Marco Collie, MD  HPI/Recap of past 82 hours: 75 year old female past mental history of COPD, CAD and diabetes mellitus admitted on 9/26 5 days of progressively worsening shortness of breath and cough and found to have both COPD and CHF exacerbation. Patient was transferred to step down unit for placement of PICC line and starting of milrinone drip. Unfortunately, with her worsening heart failure, she has had little response to maximized therapy including amiodarone and Lasix continual infusions and milrinone. Palliative care was consulted and patient is being transitioned to comfort care. This has been somewhat limited as in discussion with the patient and her husband, she remains a full code, despite understanding what that means.  Palliative care to continue working with family.    Assessment/Plan:    Acute on chronic combined systolic and diastolic heart failure [facility]): Status post right heart catheterization on 9/28. Patient found to have severely elevated biventricular pressures with moderate pulmonary venous hypertension and right-sided failure -Continue Lasix, Metolazone infusion plus milrinone and amiodarone. Despite aggressive measures, prognosis remains very poor. Palliative care has been consulted. His expected that she will not arrived the next few weeks    Chronic atrial fibrillation [facility]): Chads 2 score of 6. Amiodarone PO. Plus beta blocker. Her recent admission for GI bleed so Coumadin was held.    Type 2 diabetes mellitus with stage 4 chronic kidney disease, with long-term current use of insulin ([place]): A1c last month at 7.4. On low-dose Lantus plus sliding scale.  Not eating much, watch closely    CKD (chronic kidney disease), stage IV ([place]): Creatinine was staying stable right above 3, trending up more today.    Encephalopathy, hepatic ([facility]) with secondary Cryptogenic cirrhosis of liver ([place]):   Xifaxan, lactulose PRN    COPD exacerbation ([place]): Continue on DuoNeb's, s/p Levaquin. Still on steroids and Tessalon Perles.      Code Status: Patient remains full code  Family Communication: nurse to call me when husband arrives  Disposition Plan: I do not anticipate that she will survive this hospitalization. She likely may pass within the 1-2 weeks   Consultants:  Cardiology  Palliative care   Gastroenterology  Procedures:  Right heart catheterization done 9/28  Antimicrobials:  Levaquin 9/26-10/5   DVT prophylaxis: SCDs   Objective: Vitals:   06/21/16 0625 06/21/16 0754 06/21/16 0800 06/21/16 0804  BP:  (!) 136/56 (!) 141/66   Pulse:  (!) 119 (!) 110   Resp:  15 17   Temp:  98.1 F (36.7 C)    TempSrc:  Oral    SpO2:  100% 99% 100%  Weight: 65.3 kg (144 lb)     Height:        Intake/Output Summary (Last 24 hours) at 06/21/16 0830 Last data filed at 06/21/16 0705  Gross per 24 hour  Intake           1294.7 ml  Output             3950 ml  Net          -2655.3 ml   Filed Weights   06/19/16 0459 06/20/16 0550 06/21/16 0625  Weight: 71 kg (156 lb 8.4 oz) 68.8 kg (151 lb 10.8 oz) 65.3 kg (144 lb)    Exam:   General:  Sleepy but will wake up and make eye contact-- mild increase in her wok of breathing   Cardiovascular: Irregular rhythm, Borderline tachycardia, 3/6 systolic ejection murmur  Respiratory: Rhales, decreased breath sounds throughout   Abdomen: Soft, nontender, nondistended, positive bowel sounds   Musculoskeletal: No clubbing or cyanosis, 1+ pitting edema   Skin: No skin breaks, tears or lesions- skin on buttocks irritated by diarrhea Psychiatry: unable to assess, says a few words  Data Reviewed: CBC:  Recent Labs Lab 06/16/16 0335 06/17/16 0300 06/18/16 0345 06/19/16 0500 06/20/16 0615  WBC 4.4 5.8 5.6 6.4 6.1  NEUTROABS 4.1 5.2 5.1 5.9 5.6  HGB 8.4* 8.6* 8.2* 7.9* 8.8*  HCT 28.2* 28.4* 26.8* 25.8* 28.5*  MCV 82.5  81.6 81.5 81.9 80.3  PLT 193 191 178 136* 0000000   Basic Metabolic Panel:  Recent Labs Lab 06/17/16 0300 06/18/16 0345 06/19/16 0500 06/20/16 0615 06/21/16 0421  NA 133* 128* 124* 130* 127*  K 3.3* 3.4* 3.2* 3.4* 3.1*  CL 99* 93* 90* 88* 89*  CO2 19* 21* 21* 23 23  GLUCOSE 149* 335* 440* 196* 250*  BUN 97* 100* 98* 110* 112*  CREATININE 3.16* 3.15* 3.13* 3.37* 3.38*  CALCIUM 8.7* 8.9 8.5* 8.9 8.4*   GFR: Estimated Creatinine Clearance: 12.8 mL/min (by C-G formula based on SCr of 3.38 mg/dL (H)). Liver Function Tests: No results for input(s): AST, ALT, ALKPHOS, BILITOT, PROT, ALBUMIN in the last 168 hours. No results for input(s): LIPASE, AMYLASE in the last 168 hours.  Recent Labs Lab 06/14/16 1250 06/18/16 1205  AMMONIA 42* 39*   Coagulation Profile: No results for input(s): INR, PROTIME in the last 168 hours. Cardiac Enzymes: No results for input(s): CKTOTAL, CKMB, CKMBINDEX, TROPONINI in the last 168 hours. BNP (last 3 results) No results for input(s): PROBNP in the last 8760 hours. HbA1C: No results for input(s): HGBA1C in the last 72 hours. CBG:  Recent Labs Lab 06/20/16 0747 06/20/16 1134 06/20/16 1537 06/20/16 2200 06/21/16 0752  GLUCAP 203* 220* 120* 135* 188*   Lipid Profile: No results for input(s): CHOL, HDL, LDLCALC, TRIG, CHOLHDL, LDLDIRECT in the last 72 hours. Thyroid Function Tests: No results for input(s): TSH, T4TOTAL, FREET4, T3FREE, THYROIDAB in the last 72 hours. Anemia Panel: No results for input(s): VITAMINB12, FOLATE, FERRITIN, TIBC, IRON, RETICCTPCT in the last 72 hours. Urine analysis:    Component Value Date/Time   COLORURINE YELLOW 05/25/2016 [place] 05/25/2016 1725   LABSPEC 1.014 05/25/2016 1725   PHURINE 6.0 05/25/2016 1725   GLUCOSEU NEGATIVE 05/25/2016 1725   HGBUR NEGATIVE 05/25/2016 1725   BILIRUBINUR NEGATIVE 05/25/2016 1725   KETONESUR NEGATIVE 05/25/2016 1725   PROTEINUR NEGATIVE 05/25/2016 1725     NITRITE NEGATIVE 05/25/2016 [place] 05/25/2016 1725      Recent Results (from the past 240 hour(s))  MRSA PCR Screening     Status: None   Collection Time: 06/13/16  1:50 PM  Result Value Ref Range Status   MRSA by PCR NEGATIVE NEGATIVE Final    Comment:        The GeneXpert MRSA Assay (FDA approved for NASAL specimens only), is one component of a comprehensive MRSA colonization surveillance program. It is not intended to diagnose MRSA infection nor to guide or monitor treatment for MRSA infections.       Studies: No results found.  Scheduled Meds: . acetaZOLAMIDE  500 mg Oral BID  . allopurinol  100 mg Oral Daily  . amiodarone  200 mg Oral BID  . benzonatate  100 mg Oral TID  . cholestyramine light  4 g Oral Q12H  . heparin  5,000 Units  Subcutaneous Q8H  . hydrALAZINE  12.5 mg Oral Q8H  . insulin aspart  0-20 Units Subcutaneous TID WC  . insulin aspart  0-5 Units Subcutaneous QHS  . insulin aspart  3 Units Subcutaneous TID WC  . insulin glargine  10 Units Subcutaneous BH-q7a  . ipratropium-albuterol  3 mL Nebulization TID  . isosorbide mononitrate  30 mg Oral Daily  . levofloxacin  500 mg Oral Q48H  . methylPREDNISolone (SOLU-MEDROL) injection  60 mg Intravenous Q12H  . metolazone  5 mg Oral BID  . pantoprazole  40 mg Oral Daily  . potassium chloride  10 mEq Intravenous Q1 Hr x 4  . rifaximin  400 mg Oral TID  . sodium chloride flush  10-40 mL Intracatheter Q12H  . sodium chloride flush  3 mL Intravenous Q12H    Continuous Infusions: . furosemide (LASIX) infusion 30 mg/hr (06/21/16 0353)  . milrinone 0.125 mcg/kg/min (06/20/16 2000)     LOS: 10 days   Time spent: 25 minutes  [place], DO [facility] Pager [phone]  If 7PM-7AM, please contact night-coverage [email or web address] Password [email or web address] 06/21/2016, 8:30 AM

## 2016-06-21 NOTE — Progress Notes (Addendum)
Daily Progress Note   Patient Name: Cassandra Bonilla       Date: 06/21/2016 DOB: Jul 11, 1941  Age: 75 y.o. MRN#: [id number] Attending Physician: Geradine Girt, DO Primary Care Physician: Marco Collie, MD Admit Date: 06/11/2016  Reason for Consultation/Follow-up: Establishing goals of care   Subjective: Mrs. Umholtz is minimally responsive on my exam this morning.  I met with Leroy Kennedy at bedside.  He realizes that Mrs. Schupp is declining significantly.  The two of them have 6 children.  3 live in [place] and have been visiting their mother.  One lives in [place] and will be unable to come to [place].  The other two are driving down from [place] today and will see their mother tomorrow.  We discussed code status.  Leanna Hamid stated he wants her to die peacefully.  He does not want her coded.  He hopes the doctors will refuse to do it, but he feels like his hands are tied.  His children want her to be a full code.  They believe that would be her wish.  We discussed patient privacy and the fact that her children do not necessarily need to know what their mother's code status is.  Jakayla Schweppe indicated that he would reconsider code status tomorrow after the patient's sons from [place] have a chance to visit her.   Length of Stay: 10  Current Medications: Scheduled Meds:  . acetaZOLAMIDE  500 mg Oral BID  . allopurinol  100 mg Oral Daily  . amiodarone  200 mg Oral BID  . benzonatate  100 mg Oral TID  . cholestyramine light  4 g Oral Q12H  . heparin  5,000 Units Subcutaneous Q8H  . hydrALAZINE  12.5 mg Oral Q8H  . insulin aspart  0-20 Units Subcutaneous TID WC  . insulin aspart  0-5 Units Subcutaneous QHS  . insulin aspart  3 Units Subcutaneous TID WC  . insulin glargine  10 Units  Subcutaneous BH-q7a  . ipratropium-albuterol  3 mL Nebulization TID  . isosorbide mononitrate  30 mg Oral Daily  . levofloxacin  500 mg Oral Q48H  . methylPREDNISolone (SOLU-MEDROL) injection  60 mg Intravenous Q12H  . metolazone  5 mg Oral BID  . pantoprazole  40 mg Oral Daily  . potassium chloride  10 mEq Intravenous Q1 Hr x 4  .  rifaximin  400 mg Oral TID  . sodium chloride flush  10-40 mL Intracatheter Q12H  . sodium chloride flush  3 mL Intravenous Q12H    Continuous Infusions: . furosemide (LASIX) infusion 30 mg/hr (06/21/16 0353)  . milrinone 0.125 mcg/kg/min (06/20/16 2000)    PRN Meds: sodium chloride, sodium chloride, acetaminophen, albuterol, camphor-menthol, diphenhydrAMINE, glycopyrrolate, guaiFENesin-codeine, hydrOXYzine, lactulose, metoprolol, ondansetron **OR** ondansetron (ZOFRAN) IV, sodium chloride flush, sodium chloride flush  Physical Exam       Wd female, lethargic, eyes closed.  She tries to smile and respond to me verbally but can not. CV irregular w murmur, +JVD Resp:  NAD, Coarse breath sound bilaterally Abdomen:  Soft, NT Ext:  No LE edema     Vital Signs: BP (!) 141/66   Pulse (!) 110   Temp 98.1 F (36.7 C) (Oral)   Resp 17   Ht _0  (1.575 m)   Wt 65.3 kg (144 lb)   SpO2 100%   BMI 26.34 kg/m  SpO2: SpO2: 100 % O2 Device: O2 Device: Not Delivered O2 Flow Rate:    Intake/output summary:   Intake/Output Summary (Last 24 hours) at 06/21/16 0911 Last data filed at 06/21/16 6812  Gross per 24 hour  Intake           1294.7 ml  Output             3950 ml  Net          -2655.3 ml   LBM: Last BM Date: 06/20/16 Baseline Weight: Weight: 73.9 kg (162 lb 14.7 oz) Most recent weight: Weight: 65.3 kg (144 lb)       Palliative Assessment/Data: PPS: 20%   Flowsheet Rows   Flowsheet Row Most Recent Value  Intake Tab  Referral Department  Hospitalist  Unit at Time of Referral  ICU  Palliative Care Primary Diagnosis  Cardiac  Date Notified   06/15/16  Palliative Care Type  Return patient Palliative Care  Reason for referral  Clarify Goals of Care  Date of Admission  06/11/16  Date first seen by Palliative Care  06/17/16  # of days Palliative referral response time  2 Day(s)  # of days IP prior to Palliative referral  4  Clinical Assessment  Psychosocial & Spiritual Assessment  Palliative Care Outcomes      Patient Active Problem List   Diagnosis Date Noted  . DNR (do not resuscitate) discussion   . Gout 06/12/2016  . GERD (gastroesophageal reflux disease) 06/12/2016  . Essential hypertension 06/12/2016  . COPD exacerbation ([place]) 06/11/2016  . Hyperkalemia 06/11/2016  . Respiratory distress 06/11/2016  . Acute on chronic combined systolic and diastolic heart failure ([place]) 06/11/2016  . Palliative care encounter   . 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 Pulmonary hypertension   . GIB (gastrointestinal bleeding) 05/24/2016  . Encephalopathy, hepatic ([place]) 05/24/2016  . GI bleed 05/24/2016  . Abnormal alkaline phosphatase test   . Acute encephalopathy 04/22/2016  . Pruritus 03/22/2016  . Anemia of chronic disease 03/22/2016  . Chronic atrial fibrillation ([place]) 03/22/2016  . Type 2 diabetes mellitus with stage 4 chronic kidney disease, with long-term current use of insulin ([place]) 03/22/2016  . CKD (chronic kidney disease), stage IV ([place]) 03/22/2016    Palliative Care Assessment & Plan   Patient Profile: 75 y.o. female with past medical history of severe COPD, combined systolic/diastolic heart failure EF 40-45%, pulmonary hypertension, CKD IV, cryptogenic cirrhosis  admitted on 06/11/2016 with shortness of breath  d/t underlying COPD with severe biventricular disease. She is requiring inotropic support, Lasix infusion, amiodarone infusion with continued volume overload.    Assessment: Patient declining - based on decreased level of consciousness, worsening kidney function and the lack of response to milrinone and  lasix gtt.  She appears likely to die in the hospital.  Currently a full code.  Recommendations/Plan:  PMT will continue to follow and revisit code status as well as potentially comfort measures on 10/7  Goals of Care and Additional Recommendations:  Limitations on Scope of Treatment: Full Scope Treatment  Code Status: Full code.  Prognosis:   Hours - Days   Discharge Planning:  Hospice facility vs Hospital death.    Thank you for allowing the Palliative Medicine Team to assist in the care of this patient.   Time In:  1:40 Time Out: 2:05 Total Time 25 Prolonged Time Billed no      Greater than 50%  of this time was spent counseling and coordinating care related to the above assessment and plan.  Imogene Burn, PA-C Palliative Medicine Pager: [phone]  Please contact Palliative Medicine Team phone at [phone] for questions and concerns.

## 2016-06-21 NOTE — Progress Notes (Signed)
Chaplain presented to the patient's room, she appears to be sleeping but arouses easily, when her husband calls her name.  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  He is very supportive of his wife and her wellbeing. Chaplain offered a prayer of comfort and healing for the patient,  and prayer of peace for the husband. Chaplain will continue to follow up for spiritual care support. [place] [phone]

## 2016-06-21 NOTE — Progress Notes (Signed)
Pt potassium is 3.1. MD aware. Will continue to monitor and pass information to on-coming shift.

## 2016-06-22 DIAGNOSIS — K7469 Other cirrhosis of liver: Secondary | ICD-10-CM

## 2016-06-22 LAB — CARBOXYHEMOGLOBIN
CARBOXYHEMOGLOBIN: 2 % — AB (ref 0.5–1.5)
METHEMOGLOBIN: 0.6 % (ref 0.0–1.5)
O2 Saturation: 73 %
TOTAL HEMOGLOBIN: 7.3 g/dL — AB (ref 12.0–16.0)

## 2016-06-22 LAB — BASIC METABOLIC PANEL
ANION GAP: 15 (ref 5–15)
BUN: 117 mg/dL — ABNORMAL HIGH (ref 6–20)
CALCIUM: 7.9 mg/dL — AB (ref 8.9–10.3)
CHLORIDE: 89 mmol/L — AB (ref 101–111)
CO2: 24 mmol/L (ref 22–32)
Creatinine, Ser: 3.66 mg/dL — ABNORMAL HIGH (ref 0.44–1.00)
GFR calc Af Amer: 13 mL/min — ABNORMAL LOW (ref >60)
GFR calc non Af Amer: 11 mL/min — ABNORMAL LOW (ref >60)
GLUCOSE: 259 mg/dL — AB (ref 65–99)
Potassium: 3.3 mmol/L — ABNORMAL LOW (ref 3.5–5.1)
Sodium: 128 mmol/L — ABNORMAL LOW (ref 135–145)

## 2016-06-22 LAB — GLUCOSE, CAPILLARY
GLUCOSE-CAPILLARY: 247 mg/dL — AB (ref 65–99)
GLUCOSE-CAPILLARY: 216 mg/dL — AB (ref 65–99)
GLUCOSE-CAPILLARY: 156 mg/dL — AB (ref 65–99)
Glucose-Capillary: 245 mg/dL — ABNORMAL HIGH (ref 65–99)

## 2016-06-22 LAB — URIC ACID: Uric Acid, Serum: 8.9 mg/dL — ABNORMAL HIGH (ref 2.3–6.6)

## 2016-06-22 MED ORDER — HYDROCODONE-ACETAMINOPHEN 5-325 MG PO TABS
1.0000 | ORAL_TABLET | ORAL | Status: DC | PRN
  Administered 2016-06-22 (×2): 1 via ORAL
  Administered 2016-06-23: 2 via ORAL
  Administered 2016-06-23 – 2016-06-24 (×2): 1 via ORAL
  Administered 2016-06-25: 2 via ORAL
  Administered 2016-06-27 (×2): 1 via ORAL
  Administered 2016-06-27: 2 via ORAL
  Filled 2016-06-22: qty 1
  Filled 2016-06-22: qty 2
  Filled 2016-06-22 (×5): qty 1
  Filled 2016-06-22 (×2): qty 2

## 2016-06-22 MED ORDER — METHYLPREDNISOLONE SODIUM SUCC 40 MG IJ SOLR
40.0000 mg | Freq: Two times a day (BID) | INTRAMUSCULAR | Status: DC
  Administered 2016-06-22 – 2016-06-24 (×6): 40 mg via INTRAVENOUS
  Filled 2016-06-22 (×6): qty 1

## 2016-06-22 MED ORDER — GLUCERNA SHAKE PO LIQD
237.0000 mL | Freq: Three times a day (TID) | ORAL | Status: DC
  Administered 2016-06-22 – 2016-06-27 (×16): 237 mL via ORAL

## 2016-06-22 MED ORDER — INSULIN GLARGINE 100 UNIT/ML ~~LOC~~ SOLN
14.0000 [IU] | SUBCUTANEOUS | Status: DC
  Administered 2016-06-23 – 2016-06-26 (×4): 14 [IU] via SUBCUTANEOUS
  Filled 2016-06-22 (×4): qty 0.14

## 2016-06-22 MED ORDER — POTASSIUM CHLORIDE CRYS ER 20 MEQ PO TBCR
40.0000 meq | EXTENDED_RELEASE_TABLET | Freq: Once | ORAL | Status: AC
  Administered 2016-06-22: 40 meq via ORAL
  Filled 2016-06-22: qty 2

## 2016-06-22 NOTE — Progress Notes (Signed)
Met briefly with Rev. Dengel. He states his wife looks a little better today. The children are at the bedside. He did not want to engage in a family meeting at this point with the children. Will continue to round over the weekend and offer support for patient and her family. Chart reviewed. See that her medical condition is tenuous and she remains a full code. Rev. Oriol does reiterate right now that there are no changes to plan of care or code status.  Romona Curls, ANP

## 2016-06-22 NOTE — Progress Notes (Addendum)
PROGRESS NOTE  Cassandra Bonilla [id number] DOB: 10-22-1940 DOA: 06/11/2016 PCP: Marco Collie, MD  HPI/Recap of past 16 hours: 75 year old female past mental history of COPD, CAD and diabetes mellitus admitted on 9/26 5 days of progressively worsening shortness of breath and cough and found to have both COPD and CHF exacerbation. Patient was transferred to step down unit for placement of PICC line and starting of milrinone drip. Unfortunately, with her worsening heart failure, she has had little response to maximized therapy including amiodarone and Lasix continual infusions and milrinone. Palliative care was consulted and patient is being transitioned to comfort care. This has been somewhat limited as in discussion with the patient and her husband, she remains a full code, despite understanding what that means.  Palliative care to continue working with family.    Assessment/Plan:   Acute on chronic combined systolic and diastolic heart failure [facility]):  - Status post right heart catheterization on 9/28. Patient found to have severely elevated biventricular pressures with moderate pulmonary venous hypertension and right-sided failure - Management per CHF team, Continue Lasix, Metolazone infusion plus milrinone and amiodarone. - Despite aggressive measures, prognosis remains very poor. Palliative care has been consulted. Given overall poor prognosis.  Chronic atrial fibrillation [facility]): Chads 2 score of 6. Amiodarone PO. Plus beta blocker. Her recent admission for GI bleed so Coumadin was held.  Type 2 diabetes mellitus  - with long-term current use of insulin ([place]): A1c last month at 7.4. Increase Lantus given increased CBGs .   CKD (chronic kidney disease), stage IV ([place]):  - Creatinine was staying stable right above 3, trending up given she is on diuresis   Encephalopathy,  - hepatic ([place]) with secondary Cryptogenic cirrhosis of liver ([place]):  Xifaxan, lactulose PRN   COPD exacerbation ([place]):    - Continue on DuoNeb's, s/p Levaquin. Will taper steroids further today  Hypokalemia/hyponatremia - Due to diuresis, monitor closely  Patient will be started on low-dose Norco given generalized pain     Code Status: Patient remains full code  Family Communication: None at bedside  Disposition Plan: The patient was very tenuous medical condition, unclear disposition plan at  Consultants:  Cardiology  Palliative care   Gastroenterology  Procedures:  Right heart catheterization done 9/28  Antimicrobials:  Levaquin 9/26-10/5   DVT prophylaxis: SCDs   Objective: Vitals:   06/22/16 0547 06/22/16 0700 06/22/16 0800 06/22/16 0802  BP: 94/72  (!) 122/59   Pulse:   (!) 103   Resp:   13   Temp:  98.4 F (36.9 C)    TempSrc:  Oral    SpO2:   100% 100%  Weight:      Height:        Intake/Output Summary (Last 24 hours) at 06/22/16 1147 Last data filed at 06/22/16 1000  Gross per 24 hour  Intake           1254.4 ml  Output             2725 ml  Net          -1470.6 ml   Filed Weights   06/20/16 0550 06/21/16 0625 06/22/16 0409  Weight: 68.8 kg (151 lb 10.8 oz) 65.3 kg (144 lb) 63.5 kg (140 lb)    Exam:   General:  Awake, alert, communicative and pleasant  Cardiovascular: Irregular rhythm, Borderline tachycardia, 3/6 systolic ejection murmur   Respiratory: Rhales, decreased breath sounds throughout   Abdomen: Soft, nontender, nondistended, positive bowel sounds   Musculoskeletal: No  clubbing or cyanosis, 1+ pitting edema   Skin: No skin breaks, tears or lesions- skin on buttocks irritated by diarrhea  Data Reviewed: CBC:  Recent Labs Lab 06/16/16 0335 06/17/16 0300 06/18/16 0345 06/19/16 0500 06/20/16 0615  WBC 4.4 5.8 5.6 6.4 6.1  NEUTROABS 4.1 5.2 5.1 5.9 5.6  HGB 8.4* 8.6* 8.2* 7.9* 8.8*  HCT 28.2* 28.4* 26.8* 25.8* 28.5*  MCV 82.5 81.6 81.5 81.9 80.3  PLT 193 191 178 136* 0000000   Basic Metabolic Panel:  Recent Labs Lab 06/18/16 0345  06/19/16 0500 06/20/16 0615 06/21/16 0421 06/22/16 0910  NA 128* 124* 130* 127* 128*  K 3.4* 3.2* 3.4* 3.1* 3.3*  CL 93* 90* 88* 89* 89*  CO2 21* 21* 23 23 24   GLUCOSE 335* 440* 196* 250* 259*  BUN 100* 98* 110* 112* 117*  CREATININE 3.15* 3.13* 3.37* 3.38* 3.66*  CALCIUM 8.9 8.5* 8.9 8.4* 7.9*   GFR: Estimated Creatinine Clearance: 11.6 mL/min (by C-G formula based on SCr of 3.66 mg/dL (H)). Liver Function Tests: No results for input(s): AST, ALT, ALKPHOS, BILITOT, PROT, ALBUMIN in the last 168 hours. No results for input(s): LIPASE, AMYLASE in the last 168 hours.  Recent Labs Lab 06/18/16 1205  AMMONIA 39*   Coagulation Profile: No results for input(s): INR, PROTIME in the last 168 hours. Cardiac Enzymes: No results for input(s): CKTOTAL, CKMB, CKMBINDEX, TROPONINI in the last 168 hours. BNP (last 3 results) No results for input(s): PROBNP in the last 8760 hours. HbA1C: No results for input(s): HGBA1C in the last 72 hours. CBG:  Recent Labs Lab 06/21/16 1128 06/21/16 1607 06/21/16 2141 06/22/16 0732 06/22/16 1135  GLUCAP 166* 120* 161* 245* 247*   Lipid Profile: No results for input(s): CHOL, HDL, LDLCALC, TRIG, CHOLHDL, LDLDIRECT in the last 72 hours. Thyroid Function Tests: No results for input(s): TSH, T4TOTAL, FREET4, T3FREE, THYROIDAB in the last 72 hours. Anemia Panel: No results for input(s): VITAMINB12, FOLATE, FERRITIN, TIBC, IRON, RETICCTPCT in the last 72 hours. Urine analysis:    Component Value Date/Time   COLORURINE YELLOW 05/25/2016 [place] 05/25/2016 1725   LABSPEC 1.014 05/25/2016 1725   PHURINE 6.0 05/25/2016 1725   GLUCOSEU NEGATIVE 05/25/2016 1725   HGBUR NEGATIVE 05/25/2016 1725   BILIRUBINUR NEGATIVE 05/25/2016 1725   KETONESUR NEGATIVE 05/25/2016 1725   PROTEINUR NEGATIVE 05/25/2016 1725   NITRITE NEGATIVE 05/25/2016 [place] 05/25/2016 1725      Recent Results (from the past 240  hour(s))  MRSA PCR Screening     Status: None   Collection Time: 06/13/16  1:50 PM  Result Value Ref Range Status   MRSA by PCR NEGATIVE NEGATIVE Final    Comment:        The GeneXpert MRSA Assay (FDA approved for NASAL specimens only), is one component of a comprehensive MRSA colonization surveillance program. It is not intended to diagnose MRSA infection nor to guide or monitor treatment for MRSA infections.       Studies: No results found.  Scheduled Meds: . acetaZOLAMIDE  500 mg Oral BID  . allopurinol  100 mg Oral Daily  . amiodarone  200 mg Oral BID  . benzonatate  100 mg Oral TID  . cholestyramine light  4 g Oral Q12H  . feeding supplement (GLUCERNA SHAKE)  237 mL Oral TID BM  . heparin  5,000 Units Subcutaneous Q8H  . hydrALAZINE  12.5 mg Oral Q8H  . insulin aspart  0-20 Units Subcutaneous TID  WC  . insulin aspart  0-5 Units Subcutaneous QHS  . insulin aspart  3 Units Subcutaneous TID WC  . insulin glargine  10 Units Subcutaneous BH-q7a  . ipratropium-albuterol  3 mL Nebulization TID  . isosorbide mononitrate  30 mg Oral Daily  . levofloxacin  500 mg Oral Q48H  . mouth rinse  15 mL Mouth Rinse BID  . methylPREDNISolone (SOLU-MEDROL) injection  60 mg Intravenous Q12H  . metolazone  5 mg Oral BID  . pantoprazole  40 mg Oral Daily  . potassium chloride  40 mEq Oral Once  . rifaximin  400 mg Oral TID  . sodium chloride flush  10-40 mL Intracatheter Q12H  . sodium chloride flush  3 mL Intravenous Q12H    Continuous Infusions: . furosemide (LASIX) infusion 30 mg/hr (06/22/16 1000)  . milrinone 0.125 mcg/kg/min (06/22/16 1000)     LOS: 11 days   Time spent: 25 minutes  ELGERGAWY, DAWOOD, MD [facility] Pager [phone]  If 7PM-7AM, please contact night-coverage [email or web address] Password [email or web address] 06/22/2016, 11:47 AM

## 2016-06-22 NOTE — Progress Notes (Addendum)
Patient ID: Cassandra Bonilla, female   DOB: 1941-08-22, 75 y.o.   MRN: [id number]   Patient Name: Cassandra Bonilla Date of Encounter: 06/22/2016  Primary Cardiologist: Dr. Oval Linsey    Subjective   Chronically ill woman with severe COPD and mixed systolic/diastolic HF and pulmonary HTN. Underwent RHC which showed severely elevated biventricular pressures with low cardiac output and RV strain. Moved to SDU. PICC placed and milrinone started. Increased lasix.   Feel good. Breathing ok. No orthopnea PND. Co-ox 73% on milrinone 0.125 mcg/kg/min.On lasix drip 30 mg per hour + metolazone.  CVP now 19..  Weight down another 4 pounds (27 pounds total). Creatinine trending up 3.1>3.4>3.38>3.6. K 3.3    RHC 06/13/16 RA = 30 RV = 68/26 PA = 69/25 (43) PCW = 33 Fick cardiac output/index = 3.7/2.1 Thermo CO/CI = 4.0/2.3 PVR = 2.5 WU Ao sat = 99% PA sat = 45%, 46%  Echo (9/17) with EF 40-45%, PASP 75, RV mildly dilated/mild-moderately decreased systolic function, severe TR.   Inpatient Medications    Scheduled Meds: . acetaZOLAMIDE  500 mg Oral BID  . allopurinol  100 mg Oral Daily  . amiodarone  200 mg Oral BID  . benzonatate  100 mg Oral TID  . cholestyramine light  4 g Oral Q12H  . feeding supplement (GLUCERNA SHAKE)  237 mL Oral TID BM  . heparin  5,000 Units Subcutaneous Q8H  . hydrALAZINE  12.5 mg Oral Q8H  . insulin aspart  0-20 Units Subcutaneous TID WC  . insulin aspart  0-5 Units Subcutaneous QHS  . insulin aspart  3 Units Subcutaneous TID WC  . insulin glargine  10 Units Subcutaneous BH-q7a  . ipratropium-albuterol  3 mL Nebulization TID  . isosorbide mononitrate  30 mg Oral Daily  . levofloxacin  500 mg Oral Q48H  . mouth rinse  15 mL Mouth Rinse BID  . methylPREDNISolone (SOLU-MEDROL) injection  60 mg Intravenous Q12H  . metolazone  5 mg Oral BID  . pantoprazole  40 mg Oral Daily  . rifaximin  400 mg Oral TID  . sodium chloride flush  10-40 mL Intracatheter Q12H  . sodium  chloride flush  3 mL Intravenous Q12H   Continuous Infusions: . furosemide (LASIX) infusion 30 mg/hr (06/22/16 1000)  . milrinone 0.125 mcg/kg/min (06/22/16 1000)   PRN Meds:.sodium chloride, sodium chloride, acetaminophen, albuterol, camphor-menthol, diphenhydrAMINE, glycopyrrolate, guaiFENesin-codeine, HYDROcodone-acetaminophen, hydrOXYzine, lactulose, metoprolol, ondansetron **OR** ondansetron (ZOFRAN) IV, sodium chloride flush, sodium chloride flush   Vital Signs    Vitals:   06/22/16 0547 06/22/16 0700 06/22/16 0800 06/22/16 0802  BP: 94/72  (!) 122/59   Pulse:   (!) 103   Resp:   13   Temp:  98.4 F (36.9 C)    TempSrc:  Oral    SpO2:   100% 100%  Weight:      Height:        Intake/Output Summary (Last 24 hours) at 06/22/16 1122 Last data filed at 06/22/16 1000  Gross per 24 hour  Intake           1254.4 ml  Output             2725 ml  Net          -1470.6 ml   Filed Weights   06/20/16 0550 06/21/16 0625 06/22/16 0409  Weight: 68.8 kg (151 lb 10.8 oz) 65.3 kg (144 lb) 63.5 kg (140 lb)    Physical Exam  CVP 23 GEN: Elderly and chronically  ill appearing.  In bed  HEENT: Normal Neck: Supple, JVP to jaw, carotid bruits, or masses. Cardiac: RRR, no murmurs, rubs, or gallops. No clubbing, cyanosis, edema.  Radials/DP/PT 2+ and equal bilaterally.  Respiratory:  Clear.   GI: Soft, NT, ND, no HSM. No bruits or masses. +BS  MS: no deformity or atrophy. RUE PICC  Skin: warm and dry, no rash. Neuro:  Lethargic   Labs    CBC  Recent Labs  06/20/16 0615  WBC 6.1  NEUTROABS 5.6  HGB 8.8*  HCT 28.5*  MCV 80.3  PLT 0000000   Basic Metabolic Panel  Recent Labs  06/21/16 0421 06/22/16 0910  NA 127* 128*  K 3.1* 3.3*  CL 89* 89*  CO2 23 24  GLUCOSE 250* 259*  BUN 112* 117*  CREATININE 3.38* 3.66*  CALCIUM 8.4* 7.9*   Liver Function Tests No results for input(s): AST, ALT, ALKPHOS, BILITOT, PROT, ALBUMIN in the last 72 hours. Cardiac Enzymes No results for  input(s): CKTOTAL, CKMB, CKMBINDEX, TROPONINI in the last 72 hours.  Telemetry     atrial fibrillation- Personally Reviewed   Radiology    No results found.   Cardiac Studies   Transthoracic Echocardiography 05/26/16 Study Conclusions  - Left ventricle: The cavity size was normal. Wall thickness was   increased in a pattern of mild LVH. Systolic function was mildly   to moderately reduced. The estimated ejection fraction was in the   range of 40% to 45%. - Ventricular septum: The contour showed diastolic flattening and   systolic flattening. - Aortic valve: Severely calcified annulus. - Mitral valve: There was mild to moderate regurgitation. - Left atrium: The atrium was moderately dilated. - Right ventricle: The cavity size was mildly dilated. Systolic   function was mildly to moderately reduced. - Right atrium: The atrium was mildly dilated. - Tricuspid valve: There was severe regurgitation. - Pulmonary arteries: Systolic pressure was severely increased. PA   peak pressure: 75 mm Hg (S).  Patient Profile  75 yo female with PMH of chronic systolic and diastolic heart failure (LVEF 40-45%), chronic atrial fibrillation COPD, carotid stenosis s/p CEA, CAD s/p CABG, diabetes mellitus, CKD 4, GI bleed, cryptogenic cirrhosis, and severe pulmonary hypertension who presented with worsening dyspnea.      Assessment   1. Acute on chronic combined biventricular failure: Likely end stage 2. Pulmonary HTN: Looks like primarily pulmonary venous hypertension.  3. Severe COPD 4. AKI on CKD IV 5. Hx of hepatic encephalopathy and cryptogenic cirrhosis 6. Chronic atrial fibrillation.   Plan    PICC placed and started on milrinone with elevated filling pressures, low output, and marked volume overload.      CVP now 19. Co-ox ok. Urine output picking up creatinine also up. Will continue milrinone. Decrease lasix to 15.  Would like to get as dry as possible before switching to po  demadex. Supp K+  IM managing COPD flare.   Continue lactulose for hepatic encephalopathy. Most recent Ammonia 39.    Chronic atrial fibrillation but HR better controlled 90s continue amio 200 mg twice a day.  Has not been on AC due to cirrhosis   Prognosis very tenuous. 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    Palliative Care appreciated.     Treasa School, MD 06/22/2016, 11:22 AM   Advanced Heart Failure Team Pager [phone] (M-F; 7a - 4p)  Please contact [facility] for  night-coverage after hours (4p -7a ) and weekends on [email or web address]

## 2016-06-23 LAB — BASIC METABOLIC PANEL
Anion gap: 16 — ABNORMAL HIGH (ref 5–15)
BUN: 118 mg/dL — ABNORMAL HIGH (ref 6–20)
CALCIUM: 8 mg/dL — AB (ref 8.9–10.3)
CO2: 25 mmol/L (ref 22–32)
CREATININE: 3.67 mg/dL — AB (ref 0.44–1.00)
Chloride: 86 mmol/L — ABNORMAL LOW (ref 101–111)
GFR calc Af Amer: 13 mL/min — ABNORMAL LOW (ref >60)
GFR calc non Af Amer: 11 mL/min — ABNORMAL LOW (ref >60)
GLUCOSE: 220 mg/dL — AB (ref 65–99)
Potassium: 3.2 mmol/L — ABNORMAL LOW (ref 3.5–5.1)
Sodium: 127 mmol/L — ABNORMAL LOW (ref 135–145)

## 2016-06-23 LAB — GLUCOSE, CAPILLARY
GLUCOSE-CAPILLARY: 133 mg/dL — AB (ref 65–99)
GLUCOSE-CAPILLARY: 160 mg/dL — AB (ref 65–99)
Glucose-Capillary: 144 mg/dL — ABNORMAL HIGH (ref 65–99)
Glucose-Capillary: 166 mg/dL — ABNORMAL HIGH (ref 65–99)

## 2016-06-23 LAB — CARBOXYHEMOGLOBIN
CARBOXYHEMOGLOBIN: 1.7 % — AB (ref 0.5–1.5)
Methemoglobin: 1.1 % (ref 0.0–1.5)
O2 SAT: 74.4 %
Total hemoglobin: 7.7 g/dL — ABNORMAL LOW (ref 12.0–16.0)

## 2016-06-23 LAB — MAGNESIUM: Magnesium: 1.5 mg/dL — ABNORMAL LOW (ref 1.7–2.4)

## 2016-06-23 MED ORDER — POTASSIUM CHLORIDE CRYS ER 20 MEQ PO TBCR: 40.0000 meq | EXTENDED_RELEASE_TABLET | Freq: Every day | ORAL | Status: DC

## 2016-06-23 MED ORDER — RIFAXIMIN 200 MG PO TABS
400.0000 mg | ORAL_TABLET | Freq: Two times a day (BID) | ORAL | Status: DC
  Administered 2016-06-23 – 2016-06-27 (×8): 400 mg via ORAL
  Filled 2016-06-23 (×9): qty 2

## 2016-06-23 MED ORDER — MAGNESIUM SULFATE 2 GM/50ML IV SOLN
2.0000 g | Freq: Once | INTRAVENOUS | Status: AC
  Administered 2016-06-23: 2 g via INTRAVENOUS
  Filled 2016-06-23: qty 50

## 2016-06-23 MED ORDER — POTASSIUM CHLORIDE CRYS ER 20 MEQ PO TBCR
40.0000 meq | EXTENDED_RELEASE_TABLET | Freq: Once | ORAL | Status: AC
Start: 2016-06-23 — End: 2016-06-23
  Administered 2016-06-23: 40 meq via ORAL
  Filled 2016-06-23: qty 2

## 2016-06-23 MED ORDER — TORSEMIDE 20 MG PO TABS
60.0000 mg | ORAL_TABLET | Freq: Two times a day (BID) | ORAL | Status: DC
  Administered 2016-06-23 – 2016-06-24 (×3): 60 mg via ORAL
  Filled 2016-06-23 (×3): qty 3

## 2016-06-23 MED ORDER — POTASSIUM CHLORIDE CRYS ER 20 MEQ PO TBCR
40.0000 meq | EXTENDED_RELEASE_TABLET | Freq: Once | ORAL | Status: AC
  Administered 2016-06-23: 40 meq via ORAL
  Filled 2016-06-23: qty 2

## 2016-06-23 NOTE — Progress Notes (Signed)
Patient ID: Cassandra Bonilla, female   DOB: 09/26/1940, 75 y.o.   MRN: [id number]   Patient Name: Cassandra Bonilla Date of Encounter: 06/23/2016  Primary Cardiologist: Dr. Oval Linsey    Subjective   Chronically ill woman with severe COPD and mixed systolic/diastolic HF and pulmonary HTN. Underwent RHC which showed severely elevated biventricular pressures with low cardiac output and RV strain. Moved to SDU. PICC placed and milrinone started. Increased lasix.   Feels ok.  Denies SOB, orthopnea or PND. Husband at bedside. Lasix drip cut back to 15 yesterday due to worsening renal function. On milrinone 0.125 mcg/kg/min. Co-ox 74%. Weight stable. CVP 18-19  Creatinine stable today3.1>3.4>3.38>3.6 > 3.6  RHC 06/13/16 RA = 30 RV = 68/26 PA = 69/25 (43) PCW = 33 Fick cardiac output/index = 3.7/2.1 Thermo CO/CI = 4.0/2.3 PVR = 2.5 WU Ao sat = 99% PA sat = 45%, 46%  Echo (9/17) with EF 40-45%, PASP 75, RV mildly dilated/mild-moderately decreased systolic function, severe TR.   Inpatient Medications    Scheduled Meds: . allopurinol  100 mg Oral Daily  . amiodarone  200 mg Oral BID  . benzonatate  100 mg Oral TID  . cholestyramine light  4 g Oral Q12H  . feeding supplement (GLUCERNA SHAKE)  237 mL Oral TID BM  . heparin  5,000 Units Subcutaneous Q8H  . hydrALAZINE  12.5 mg Oral Q8H  . insulin aspart  0-20 Units Subcutaneous TID WC  . insulin aspart  0-5 Units Subcutaneous QHS  . insulin aspart  3 Units Subcutaneous TID WC  . insulin glargine  14 Units Subcutaneous BH-q7a  . ipratropium-albuterol  3 mL Nebulization TID  . isosorbide mononitrate  30 mg Oral Daily  . mouth rinse  15 mL Mouth Rinse BID  . methylPREDNISolone (SOLU-MEDROL) injection  40 mg Intravenous BID  . metolazone  5 mg Oral BID  . pantoprazole  40 mg Oral Daily  . potassium chloride  40 mEq Oral Once  . [START ON 06/24/2016] potassium chloride  40 mEq Oral Daily  . rifaximin  400 mg Oral TID  . sodium chloride flush  10-40  mL Intracatheter Q12H  . sodium chloride flush  3 mL Intravenous Q12H   Continuous Infusions: . furosemide (LASIX) infusion 15 mg/hr (06/23/16 1006)  . milrinone 0.125 mcg/kg/min (06/23/16 1006)   PRN Meds:.sodium chloride, sodium chloride, acetaminophen, albuterol, camphor-menthol, diphenhydrAMINE, glycopyrrolate, guaiFENesin-codeine, HYDROcodone-acetaminophen, hydrOXYzine, lactulose, metoprolol, ondansetron **OR** ondansetron (ZOFRAN) IV, sodium chloride flush, sodium chloride flush   Vital Signs    Vitals:   06/23/16 0400 06/23/16 0600 06/23/16 0800 06/23/16 0849  BP: 118/64 137/60 126/61   Pulse: (!) 106 (!) 109 99   Resp: 15 16 14    Temp:   98 F (36.7 C)   TempSrc:   Oral   SpO2: 100% 97% 99% 99%  Weight:      Height:        Intake/Output Summary (Last 24 hours) at 06/23/16 1201 Last data filed at 06/23/16 0818  Gross per 24 hour  Intake          1060.31 ml  Output             3310 ml  Net         -2249.69 ml   Filed Weights   06/21/16 0625 06/22/16 0409 06/23/16 0330  Weight: 65.3 kg (144 lb) 63.5 kg (140 lb) 64 kg (141 lb)    Physical Exam  CVP 18-19 GEN: Sitting up in bed eating  graham crackers HEENT: Normal Neck: Supple, JVP to jaw, carotid bruits, or masses. Cardiac: RRR, no murmurs, rubs, or gallops. No clubbing, cyanosis, edema.  Radials/DP/PT 2+ and equal bilaterally.  Respiratory:  Clear.   GI: Soft, NT, ND, no HSM. No bruits or masses. +BS  MS: no deformity or atrophy. RUE PICC  Skin: warm and dry, no rash. Neuro:  Alert and conversant  Labs    CBC No results for input(s): WBC, NEUTROABS, HGB, HCT, MCV, PLT in the last 72 hours. Basic Metabolic Panel  Recent Labs  06/22/16 0910 06/23/16 0400  NA 128* 127*  K 3.3* 3.2*  CL 89* 86*  CO2 24 25  GLUCOSE 259* 220*  BUN 117* 118*  CREATININE 3.66* 3.67*  CALCIUM 7.9* 8.0*  MG  --  1.5*   Liver Function Tests No results for input(s): AST, ALT, ALKPHOS, BILITOT, PROT, ALBUMIN in the last 72  hours. Cardiac Enzymes No results for input(s): CKTOTAL, CKMB, CKMBINDEX, TROPONINI in the last 72 hours.  Telemetry     atrial fibrillation- Personally Reviewed   Radiology    No results found.   Cardiac Studies   Transthoracic Echocardiography 05/26/16 Study Conclusions  - Left ventricle: The cavity size was normal. Wall thickness was   increased in a pattern of mild LVH. Systolic function was mildly   to moderately reduced. The estimated ejection fraction was in the   range of 40% to 45%. - Ventricular septum: The contour showed diastolic flattening and   systolic flattening. - Aortic valve: Severely calcified annulus. - Mitral valve: There was mild to moderate regurgitation. - Left atrium: The atrium was moderately dilated. - Right ventricle: The cavity size was mildly dilated. Systolic   function was mildly to moderately reduced. - Right atrium: The atrium was mildly dilated. - Tricuspid valve: There was severe regurgitation. - Pulmonary arteries: Systolic pressure was severely increased. PA   peak pressure: 75 mm Hg (S).  Patient Profile  75 yo female with PMH of chronic systolic and diastolic heart failure (LVEF 40-45%), chronic atrial fibrillation COPD, carotid stenosis s/p CEA, CAD s/p CABG, diabetes mellitus, CKD 4, GI bleed, cryptogenic cirrhosis, and severe pulmonary hypertension who presented with worsening dyspnea.      Assessment   1. Acute on chronic combined biventricular failure: Likely end stage 2. Pulmonary HTN: Looks like primarily pulmonary venous hypertension.  3. Severe COPD 4. AKI on CKD IV 5. Hx of hepatic encephalopathy and cryptogenic cirrhosis 6. Chronic atrial fibrillation.   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 CVP now 18-19. Co-ox ok. I think this is about as good as we will be able to get her. Will stop milrinone and IV lasix. Start torsemide 60 po bid. Watch CVP and co-ox. Wil ltry to get up in chair. If declines immediately  off drips then will need to discuss palliative care with them again.  Chronic atrial fibrillation but HR better controlled 90s continue amio 200 mg twice a day.  Has not been on [facility] due to cirrhosis   Cassandra Bickers, MD 06/23/2016, 12:01 PM   Advanced Heart Failure Team Pager [phone] (M-F; [place])  Please contact [facility] for night-coverage after hours (4p -7a ) and weekends on [email or web address]

## 2016-06-23 NOTE — Progress Notes (Signed)
PROGRESS NOTE  Corretta Sala [id number] DOB: 1941/01/18 DOA: 06/11/2016 PCP: Marco Collie, MD  HPI/Recap of past 24 hours: 75 year old female past mental history of COPD, CAD and diabetes mellitus admitted on 9/26 5 days of progressively worsening shortness of breath and cough and found to have both COPD and CHF exacerbation. Patient was transferred to step down unit for placement of PICC line and starting of milrinone drip. Unfortunately, with her worsening heart failure, she has had little response to maximized therapy including amiodarone and Lasix continual infusions and milrinone. Palliative care was consulted " status and goals of care , Palliative care to continue working with family.    Assessment/Plan:   Acute on chronic combined systolic and diastolic heart failure [facility]):  - Status post right heart catheterization on 9/28. Patient found to have severely elevated biventricular pressures with moderate pulmonary venous hypertension and right-sided failure - Management per CHF team, on  Lasix drip, Metolazone , and  Milrinone infusion,  - Despite aggressive measures, prognosis remains very poor. Palliative care has been consulted. Given overall poor prognosis.    Chronic atrial fibrillation ([place]):  - Chads 2 score of 6. On Amiodarone and beta blocker.  recent admission for GI bleed so Coumadin was held.  Type 2 diabetes mellitus  - with long-term current use of insulin, -  A1c last month at 7.4. Continue with Lantus and insulin sliding scale.   CKD (chronic kidney disease), stage IV ([place]):  - Creatinine was staying stable right above 3, trending up given she is on diuresis   Encephalopathy,  - hepatic ([place]) with secondary Cryptogenic cirrhosis of liver ([place]):  Xifaxan, lactulose PRN   COPD exacerbation ([place]):  - Continue on DuoNeb's, s/p Levaquin. Will taper steroids further today  hyponatremia - Due to diuresis, monitor closely  Hypokalemia/hypomagnesemia - Repleted,  recheck in a.m.  Patient will be started on low-dose Norco given generalized pain    DVT prophylaxis: [place] Heparin  Code Status: Patient remains full code  Family Communication: None at bedside  Disposition Plan: The patient was very tenuous medical condition, unclear disposition plan at  Consultants:  Cardiology  Palliative care   Gastroenterology  Procedures:  Right heart catheterization done 9/28  Antimicrobials:  Levaquin 9/26-10/5   DVT prophylaxis: SCDs   Objective: Vitals:   06/23/16 0400 06/23/16 0600 06/23/16 0800 06/23/16 0849  BP: 118/64 137/60 126/61   Pulse: (!) 106 (!) 109 99   Resp: 15 16 14    Temp:   98 F (36.7 C)   TempSrc:   Oral   SpO2: 100% 97% 99% 99%  Weight:      Height:        Intake/Output Summary (Last 24 hours) at 06/23/16 1052 Last data filed at 06/23/16 0818  Gross per 24 hour  Intake          1125.91 ml  Output             3585 ml  Net         -2459.09 ml   Filed Weights   06/21/16 0625 06/22/16 0409 06/23/16 0330  Weight: 65.3 kg (144 lb) 63.5 kg (140 lb) 64 kg (141 lb)    Exam:   General:  Awake, alert, communicative and pleasant  Cardiovascular: Irregular rhythm, Borderline tachycardia, 3/6 systolic ejection murmur   Respiratory: Rhales, decreased breath sounds throughout   Abdomen: Soft, nontender, nondistended, positive bowel sounds   Musculoskeletal: No clubbing or cyanosis, Trace  edema   Skin: No skin  breaks, tears or lesions- skin on buttocks irritated by diarrhea  Data Reviewed: CBC:  Recent Labs Lab 06/17/16 0300 06/18/16 0345 06/19/16 0500 06/20/16 0615  WBC 5.8 5.6 6.4 6.1  NEUTROABS 5.2 5.1 5.9 5.6  HGB 8.6* 8.2* 7.9* 8.8*  HCT 28.4* 26.8* 25.8* 28.5*  MCV 81.6 81.5 81.9 80.3  PLT 191 178 136* 0000000   Basic Metabolic Panel:  Recent Labs Lab 06/19/16 0500 06/20/16 0615 06/21/16 0421 06/22/16 0910 06/23/16 0400  NA 124* 130* 127* 128* 127*  K 3.2* 3.4* 3.1* 3.3* 3.2*  CL 90*  88* 89* 89* 86*  CO2 21* 23 23 24 25   GLUCOSE 440* 196* 250* 259* 220*  BUN 98* 110* 112* 117* 118*  CREATININE 3.13* 3.37* 3.38* 3.66* 3.67*  CALCIUM 8.5* 8.9 8.4* 7.9* 8.0*  MG  --   --   --   --  1.5*   GFR: Estimated Creatinine Clearance: 11.6 mL/min (by C-G formula based on SCr of 3.67 mg/dL (H)). Liver Function Tests: No results for input(s): AST, ALT, ALKPHOS, BILITOT, PROT, ALBUMIN in the last 168 hours. No results for input(s): LIPASE, AMYLASE in the last 168 hours.  Recent Labs Lab 06/18/16 1205  AMMONIA 39*   Coagulation Profile: No results for input(s): INR, PROTIME in the last 168 hours. Cardiac Enzymes: No results for input(s): CKTOTAL, CKMB, CKMBINDEX, TROPONINI in the last 168 hours. BNP (last 3 results) No results for input(s): PROBNP in the last 8760 hours. HbA1C: No results for input(s): HGBA1C in the last 72 hours. CBG:  Recent Labs Lab 06/22/16 0732 06/22/16 1135 06/22/16 1622 06/22/16 2144 06/23/16 0756  GLUCAP 245* 247* 216* 156* 133*   Lipid Profile: No results for input(s): CHOL, HDL, LDLCALC, TRIG, CHOLHDL, LDLDIRECT in the last 72 hours. Thyroid Function Tests: No results for input(s): TSH, T4TOTAL, FREET4, T3FREE, THYROIDAB in the last 72 hours. Anemia Panel: No results for input(s): VITAMINB12, FOLATE, FERRITIN, TIBC, IRON, RETICCTPCT in the last 72 hours. Urine analysis:    Component Value Date/Time   COLORURINE YELLOW 05/25/2016 [place] 05/25/2016 1725   LABSPEC 1.014 05/25/2016 1725   PHURINE 6.0 05/25/2016 1725   GLUCOSEU NEGATIVE 05/25/2016 1725   HGBUR NEGATIVE 05/25/2016 1725   BILIRUBINUR NEGATIVE 05/25/2016 1725   KETONESUR NEGATIVE 05/25/2016 1725   PROTEINUR NEGATIVE 05/25/2016 1725   NITRITE NEGATIVE 05/25/2016 [place] 05/25/2016 1725      Recent Results (from the past 240 hour(s))  MRSA PCR Screening     Status: None   Collection Time: 06/13/16  1:50 PM  Result Value Ref  Range Status   MRSA by PCR NEGATIVE NEGATIVE Final    Comment:        The GeneXpert MRSA Assay (FDA approved for NASAL specimens only), is one component of a comprehensive MRSA colonization surveillance program. It is not intended to diagnose MRSA infection nor to guide or monitor treatment for MRSA infections.       Studies: No results found.  Scheduled Meds: . allopurinol  100 mg Oral Daily  . amiodarone  200 mg Oral BID  . benzonatate  100 mg Oral TID  . cholestyramine light  4 g Oral Q12H  . feeding supplement (GLUCERNA SHAKE)  237 mL Oral TID BM  . heparin  5,000 Units Subcutaneous Q8H  . hydrALAZINE  12.5 mg Oral Q8H  . insulin aspart  0-20 Units Subcutaneous TID WC  . insulin aspart  0-5 Units Subcutaneous QHS  .  insulin aspart  3 Units Subcutaneous TID WC  . insulin glargine  14 Units Subcutaneous BH-q7a  . ipratropium-albuterol  3 mL Nebulization TID  . isosorbide mononitrate  30 mg Oral Daily  . mouth rinse  15 mL Mouth Rinse BID  . methylPREDNISolone (SOLU-MEDROL) injection  40 mg Intravenous BID  . metolazone  5 mg Oral BID  . pantoprazole  40 mg Oral Daily  . potassium chloride  40 mEq Oral Once  . rifaximin  400 mg Oral TID  . sodium chloride flush  10-40 mL Intracatheter Q12H  . sodium chloride flush  3 mL Intravenous Q12H    Continuous Infusions: . furosemide (LASIX) infusion 15 mg/hr (06/23/16 1006)  . milrinone 0.125 mcg/kg/min (06/23/16 1006)     LOS: 12 days    ELGERGAWY, DAWOOD, MD [facility] Pager [phone]  If 7PM-7AM, please contact night-coverage [email or web address] Password [email or web address] 06/23/2016, 10:52 AM

## 2016-06-24 DIAGNOSIS — N179 Acute kidney failure, unspecified: Secondary | ICD-10-CM

## 2016-06-24 LAB — GLUCOSE, CAPILLARY
Glucose-Capillary: 192 mg/dL — ABNORMAL HIGH (ref 65–99)
Glucose-Capillary: 172 mg/dL — ABNORMAL HIGH (ref 65–99)
Glucose-Capillary: 147 mg/dL — ABNORMAL HIGH (ref 65–99)
Glucose-Capillary: 84 mg/dL (ref 65–99)

## 2016-06-24 LAB — BASIC METABOLIC PANEL
Anion gap: 16 — ABNORMAL HIGH (ref 5–15)
BUN: 127 mg/dL — ABNORMAL HIGH (ref 6–20)
CO2: 28 mmol/L (ref 22–32)
Calcium: 8.7 mg/dL — ABNORMAL LOW (ref 8.9–10.3)
Chloride: 88 mmol/L — ABNORMAL LOW (ref 101–111)
Creatinine, Ser: 3.93 mg/dL — ABNORMAL HIGH (ref 0.44–1.00)
GFR calc Af Amer: 12 mL/min — ABNORMAL LOW (ref >60)
GFR calc non Af Amer: 10 mL/min — ABNORMAL LOW (ref >60)
Glucose, Bld: 204 mg/dL — ABNORMAL HIGH (ref 65–99)
Potassium: 4 mmol/L (ref 3.5–5.1)
Sodium: 132 mmol/L — ABNORMAL LOW (ref 135–145)

## 2016-06-24 LAB — CARBOXYHEMOGLOBIN
CARBOXYHEMOGLOBIN: 1.6 % — AB (ref 0.5–1.5)
METHEMOGLOBIN: 1.1 % (ref 0.0–1.5)
O2 Saturation: 51 %
TOTAL HEMOGLOBIN: 8.2 g/dL — AB (ref 12.0–16.0)

## 2016-06-24 LAB — AMMONIA: Ammonia: 47 umol/L — ABNORMAL HIGH (ref 9–35)

## 2016-06-24 LAB — MAGNESIUM: Magnesium: 2.1 mg/dL (ref 1.7–2.4)

## 2016-06-24 MED ORDER — LACTULOSE 10 GM/15ML PO SOLN
20.0000 g | Freq: Two times a day (BID) | ORAL | Status: DC
  Administered 2016-06-24 – 2016-06-27 (×7): 20 g via ORAL
  Filled 2016-06-24 (×7): qty 30

## 2016-06-24 NOTE — Progress Notes (Signed)
PROGRESS NOTE  Cassandra Bonilla [id number] DOB: 1940-11-03 DOA: 06/11/2016 PCP: Marco Collie, MD  HPI/Recap of past 35 hours: 75 year old female past mental history of COPD, CAD and diabetes mellitus admitted on 9/26 5 days of progressively worsening shortness of breath and cough and found to have both COPD and CHF exacerbation. Patient was transferred to step down unit for placement of PICC line and starting of milrinone drip. Unfortunately, with her worsening heart failure, she has had little response to maximized therapy including amiodarone and Lasix continual infusions and milrinone. Palliative care was consulted " status and goals of care , Palliative care to continue working with family.    Assessment/Plan:   Acute on chronic combined systolic and diastolic heart failure [facility]):  - Status post right heart catheterization on 9/28. 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 - Despite aggressive measures, prognosis remains very poor. Palliative care has been consulted, discussed goals of care with the patient.  Chronic atrial fibrillation ([place]):  - Chads 2 score of 6. On Amiodarone and beta blocker. No anticoagulation currently given recent GI bleed and cirrhosis.   Type 2 diabetes mellitus  - with long-term current use of insulin, -  A1c last month at 7.4. Continue with Lantus and insulin sliding scale.   CKD (chronic kidney disease), stage IV ([place]):  - Creatinine trending up gradually, today is 3.9, diuresis change to by mouth.   Encephalopathy,  - hepatic ([place]) with secondary Cryptogenic cirrhosis of liver ([place]):  Xifaxan,  lactulose PRN   COPD exacerbation ([place]):  - Continue on DuoNeb's, s/p Levaquin. Will taper steroids further today - Ammonia is 47 today, hence lactulose from when necessary to twice daily.  hyponatremia - Due to diuresis, monitor closely  Hypokalemia/hypomagnesemia - Repleted, recheck in a.m.  Patient will be started on low-dose Norco given generalized pain    DVT prophylaxis: [place] Heparin  Code Status: Patient remains full code  Family Communication: None at bedside  Disposition Plan: The patient was very tenuous medical condition, unclear disposition plan at  Consultants:  Cardiology  Palliative care   Gastroenterology  Procedures:  Right heart catheterization done 9/28  Antimicrobials:  Levaquin 9/26-10/5   DVT prophylaxis: SCDs   Objective: Vitals:   06/24/16 0000 06/24/16 0001 06/24/16 0400 06/24/16 0731  BP:  (!) 110/56 (!) 127/56 (!) 117/42  Pulse:  86 85 84  Resp:  15 16 15   Temp:   97.9 F (36.6 C) 97.3 F (36.3 C)  TempSrc:   Oral Axillary  SpO2: 99% 99% 100% 99%  Weight:   58.6 kg (129 lb 1.6 oz)   Height:        Intake/Output Summary (Last 24 hours) at 06/24/16 0945 Last data filed at 06/24/16 0600  Gross per 24 hour  Intake           448.37 ml  Output             2605 ml  Net         -2156.63 ml   Filed Weights   06/22/16 0409 06/23/16 0330 06/24/16 0400  Weight: 63.5 kg (140 lb) 64 kg (141 lb) 58.6 kg (129 lb 1.6 oz)  Exam:   General:  Awake, alert, communicative and pleasant  Cardiovascular: Irregular rhythm, Borderline tachycardia, 3/6 systolic ejection murmur   Respiratory: Rhales, decreased breath sounds throughout   Abdomen: Soft, nontender, nondistended, positive bowel sounds   Musculoskeletal: No clubbing or cyanosis, Trace  edema   Skin: No skin breaks, tears or lesions- skin on buttocks irritated by diarrhea  Data Reviewed: CBC:  Recent Labs Lab 06/18/16 0345 06/19/16 0500 06/20/16 0615  WBC 5.6 6.4  6.1  NEUTROABS 5.1 5.9 5.6  HGB 8.2* 7.9* 8.8*  HCT 26.8* 25.8* 28.5*  MCV 81.5 81.9 80.3  PLT 178 136* 0000000   Basic Metabolic Panel:  Recent Labs Lab 06/20/16 0615 06/21/16 0421 06/22/16 0910 06/23/16 0400 06/24/16 0430  NA 130* 127* 128* 127* 132*  K 3.4* 3.1* 3.3* 3.2* 4.0  CL 88* 89* 89* 86* 88*  CO2 23 23 24 25 28   GLUCOSE 196* 250* 259* 220* 204*  BUN 110* 112* 117* 118* 127*  CREATININE 3.37* 3.38* 3.66* 3.67* 3.93*  CALCIUM 8.9 8.4* 7.9* 8.0* 8.7*  MG  --   --   --  1.5* 2.1   GFR: Estimated Creatinine Clearance: 9.8 mL/min (by C-G formula based on SCr of 3.93 mg/dL (H)). Liver Function Tests: No results for input(s): AST, ALT, ALKPHOS, BILITOT, PROT, ALBUMIN in the last 168 hours. No results for input(s): LIPASE, AMYLASE in the last 168 hours.  Recent Labs Lab 06/18/16 1205 06/24/16 0500  AMMONIA 39* 47*   Coagulation Profile: No results for input(s): INR, PROTIME in the last 168 hours. Cardiac Enzymes: No results for input(s): CKTOTAL, CKMB, CKMBINDEX, TROPONINI in the last 168 hours. BNP (last 3 results) No results for input(s): PROBNP in the last 8760 hours. HbA1C: No results for input(s): HGBA1C in the last 72 hours. CBG:  Recent Labs Lab 06/23/16 0756 06/23/16 1147 06/23/16 1625 06/23/16 2018 06/24/16 0743  GLUCAP 133* 160* 144* 166* 192*   Lipid Profile: No results for input(s): CHOL, HDL, LDLCALC, TRIG, CHOLHDL, LDLDIRECT in the last 72 hours. Thyroid Function Tests: No results for input(s): TSH, T4TOTAL, FREET4, T3FREE, THYROIDAB in the last 72 hours. Anemia Panel: No results for input(s): VITAMINB12, FOLATE, FERRITIN, TIBC, IRON, RETICCTPCT in the last 72 hours. Urine analysis:    Component Value Date/Time   COLORURINE YELLOW 05/25/2016 [place] 05/25/2016 1725   LABSPEC 1.014 05/25/2016 1725   PHURINE 6.0 05/25/2016 1725   GLUCOSEU NEGATIVE 05/25/2016 1725   HGBUR NEGATIVE 05/25/2016 1725   BILIRUBINUR NEGATIVE  05/25/2016 1725   KETONESUR NEGATIVE 05/25/2016 1725   PROTEINUR NEGATIVE 05/25/2016 1725   NITRITE NEGATIVE 05/25/2016 [place] 05/25/2016 1725      No results found for this or any previous visit (from the past 240 hour(s)).    Studies: No results found.  Scheduled Meds: . allopurinol  100 mg Oral Daily  . amiodarone  200 mg Oral BID  . benzonatate  100 mg Oral TID  . cholestyramine light  4 g Oral Q12H  . feeding supplement (GLUCERNA SHAKE)  237 mL Oral TID BM  . heparin  5,000 Units Subcutaneous Q8H  . hydrALAZINE  12.5 mg Oral Q8H  . insulin aspart  0-20 Units Subcutaneous TID WC  . insulin aspart  0-5 Units Subcutaneous QHS  . insulin aspart  3 Units Subcutaneous TID WC  . insulin glargine  14 Units Subcutaneous BH-q7a  . ipratropium-albuterol  3 mL Nebulization TID  . isosorbide mononitrate  30  mg Oral Daily  . mouth rinse  15 mL Mouth Rinse BID  . methylPREDNISolone (SOLU-MEDROL) injection  40 mg Intravenous BID  . pantoprazole  40 mg Oral Daily  . rifaximin  400 mg Oral BID  . sodium chloride flush  10-40 mL Intracatheter Q12H  . sodium chloride flush  3 mL Intravenous Q12H  . torsemide  60 mg Oral BID    Continuous Infusions:     LOS: 13 days    Mekala Winger, MD [facility] Pager [phone]  If 7PM-7AM, please contact night-coverage [email or web address] Password [email or web address] 06/24/2016, 9:45 AM

## 2016-06-24 NOTE — Care Management Note (Addendum)
Case Management Note  Patient Details  Name: Cassandra Bonilla MRN: [id number] Date of Birth: [birth year]  Subjective/Objective:   NCM spoke with spouse Anju Haithcock, offered choice for home hospice and he chose [facility],  NCM made referral to [facility].  [name] states that they have a raised toilet seat, walker, w/chair, shower bench.  NCM faxed information over to [place].    17:11 10/9 Tomi Bamberger RN, BSN - NCM received call from Mr. Busse stating that his daughter has become anxious about mother being at the house, he wanted to know if they could do residential Hospice.  NCM informed attending MD and left vm for Palliative , Alecia  NCM made referral to [place] CSW.  NCM will cont to follow for dc needs.                  Action/Plan:   Expected Discharge Date:                  Expected Discharge Plan:  [place]  In-House Referral:  Hospice / Palliative Care, Clinical Social Work  Discharge planning Services  CM Consult  Post Acute Care Choice:    Choice offered to:  Spouse  DME Arranged:    DME Agency:     HH Arranged:    [place] Agency:     Status of Service:  In process, will continue to follow  If discussed at Long Length of Stay Meetings, dates discussed:    Additional Comments:  10/12 12:30 Tomi Bamberger RN, CM NCM called [facility] to see if they would have a bed for patient, they state yes from their understanding they will be taking her today.  NCM informed Media planner and gave her the number to call report to RN there at hospice.  NCM checked with [place] CSW to see if this was set up she states yes.      Zenon Mayo, RN 06/24/2016, 5:57 PM

## 2016-06-24 NOTE — Progress Notes (Signed)
Patient up as ordered to chair . She would or could not use her legs very weak. Lift will be needed to get patient up

## 2016-06-24 NOTE — Progress Notes (Signed)
Patient ID: Cassandra Bonilla, female   DOB: [birth year], 75 y.o.   MRN: [id number]   Patient Name: Cassandra Bonilla Date of Encounter: 06/24/2016  Primary Cardiologist: Dr. Oval Linsey    Subjective   Chronically ill woman with severe COPD and mixed systolic/diastolic HF and pulmonary HTN. Underwent RHC which showed severely elevated biventricular pressures with low cardiac output and RV strain. Moved to SDU. PICC placed and milrinone started. Increased lasix.   Coox 51% this am OFF milrinone. Creatinine bumped 3.1 > 3.4 > 3.6 > 3.9. CVP 14-15. On torsemide 60 mg po BID  Feels OK this am. Denies SOB, orthopnea, or PND.   Out 2 L. Weight inaccurate. Shows down 12 lbs overnight.   [place] 06/13/16 RA = 30 RV = 68/26 PA = 69/25 (43) PCW = 33 Fick cardiac output/index = 3.7/2.1 Thermo CO/CI = 4.0/2.3 PVR = 2.5 WU Ao sat = 99% PA sat = 45%, 46%  Echo (9/17) with EF 40-45%, PASP 75, RV mildly dilated/mild-moderately decreased systolic function, severe TR.   Inpatient Medications    Scheduled Meds: . allopurinol  100 mg Oral Daily  . amiodarone  200 mg Oral BID  . benzonatate  100 mg Oral TID  . cholestyramine light  4 g Oral Q12H  . feeding supplement (GLUCERNA SHAKE)  237 mL Oral TID BM  . heparin  5,000 Units Subcutaneous Q8H  . hydrALAZINE  12.5 mg Oral Q8H  . insulin aspart  0-20 Units Subcutaneous TID WC  . insulin aspart  0-5 Units Subcutaneous QHS  . insulin aspart  3 Units Subcutaneous TID WC  . insulin glargine  14 Units Subcutaneous BH-q7a  . ipratropium-albuterol  3 mL Nebulization TID  . isosorbide mononitrate  30 mg Oral Daily  . mouth rinse  15 mL Mouth Rinse BID  . methylPREDNISolone (SOLU-MEDROL) injection  40 mg Intravenous BID  . pantoprazole  40 mg Oral Daily  . potassium chloride  40 mEq Oral Daily  . rifaximin  400 mg Oral BID  . sodium chloride flush  10-40 mL Intracatheter Q12H  . sodium chloride flush  3 mL Intravenous Q12H  . torsemide  60 mg Oral BID    Continuous Infusions:   PRN Meds:.sodium chloride, acetaminophen, albuterol, camphor-menthol, diphenhydrAMINE, glycopyrrolate, guaiFENesin-codeine, HYDROcodone-acetaminophen, hydrOXYzine, lactulose, metoprolol, ondansetron **OR** ondansetron (ZOFRAN) IV, sodium chloride flush, sodium chloride flush   Vital Signs    Vitals:   06/24/16 0000 06/24/16 0001 06/24/16 0400 06/24/16 0731  BP:  (!) 110/56 (!) 127/56   Pulse:  86 85   Resp:  15 16   Temp:   97.9 F (36.6 C)   TempSrc:   Oral   SpO2: 99% 99% 100% 99%  Weight:   129 lb 1.6 oz (58.6 kg)   Height:        Intake/Output Summary (Last 24 hours) at 06/24/16 0737 Last data filed at 06/24/16 0600  Gross per 24 hour  Intake           648.37 ml  Output             2605 ml  Net         -1956.63 ml   Filed Weights   06/22/16 0409 06/23/16 0330 06/24/16 0400  Weight: 140 lb (63.5 kg) 141 lb (64 kg) 129 lb 1.6 oz (58.6 kg)    Physical Exam  CVP 14-15 GEN: Lying in bed HEENT: Normal Neck: Supple, JVP eleavted to jaw, carotid bruits, or masses. Cardiac: RRR, no M/G/R.  No clubbing, cyanosis, edema.  Radials/DP/PT 2+ and equal bilaterally.  Respiratory:  Diminished basilar sounds.  GI: Soft, NT, ND, no HSM. No bruits or masses. +BS  MS: no deformity or atrophy. RUE PICC  Skin: warm and dry, no rash. Neuro:  Alert and conversant  Labs    CBC No results for input(s): WBC, NEUTROABS, HGB, HCT, MCV, PLT in the last 72 hours. Basic Metabolic Panel  Recent Labs  06/23/16 0400 06/24/16 0430  NA 127* 132*  K 3.2* 4.0  CL 86* 88*  CO2 25 28  GLUCOSE 220* 204*  BUN 118* 127*  CREATININE 3.67* 3.93*  CALCIUM 8.0* 8.7*  MG 1.5* 2.1   Liver Function Tests No results for input(s): AST, ALT, ALKPHOS, BILITOT, PROT, ALBUMIN in the last 72 hours. Cardiac Enzymes No results for input(s): CKTOTAL, CKMB, CKMBINDEX, TROPONINI in the last 72 hours.  Telemetry    Reviewed personally, Afib 80s.   Radiology    No results  found.   Cardiac Studies   Transthoracic Echocardiography 05/26/16 Study Conclusions  - Left ventricle: The cavity size was normal. Wall thickness was   increased in a pattern of mild LVH. Systolic function was mildly   to moderately reduced. The estimated ejection fraction was in the   range of 40% to 45%. - Ventricular septum: The contour showed diastolic flattening and   systolic flattening. - Aortic valve: Severely calcified annulus. - Mitral valve: There was mild to moderate regurgitation. - Left atrium: The atrium was moderately dilated. - Right ventricle: The cavity size was mildly dilated. Systolic   function was mildly to moderately reduced. - Right atrium: The atrium was mildly dilated. - Tricuspid valve: There was severe regurgitation. - Pulmonary arteries: Systolic pressure was severely increased. PA   peak pressure: 75 mm Hg (S).  Patient Profile  75 yo female with PMH of chronic systolic and diastolic heart failure (LVEF 40-45%), chronic atrial fibrillation COPD, carotid stenosis s/p CEA, CAD s/p CABG, diabetes mellitus, CKD 4, GI bleed, cryptogenic cirrhosis, and severe pulmonary hypertension who presented with worsening dyspnea.      Assessment   1. Acute on chronic combined biventricular failure: Likely end stage 2. Pulmonary HTN: Looks like primarily pulmonary venous hypertension.  3. Severe COPD 4. AKI on CKD IV 5. Hx of hepatic encephalopathy and cryptogenic cirrhosis 6. Chronic atrial fibrillation.   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   CVP 14-15 lying up in bed. Coox down to 51% off milrinone.   Continue torsemide 60 mg po BID.  Will stop K for now with worsening renal function and large jump overnight.   Will need to continue discussion with family.  Numbers worse this morning off milrinone and lasix. Suspect they will continue to worsen. Would keep on same meds today unless decompensates further.   Chronic A-fib.  Rate controlled currently.  Continue amio 200 mg BID. Off AC with cirrhosis.      Shirley Friar, PA-C 06/24/2016, 7:37 AM   Advanced Heart Failure Team Pager [phone] (M-F; 7a - 4p)  Please contact [facility] for night-coverage after hours (4p -7a ) and weekends on [email or web address]  Patient seen and examined with Oda Kilts, PA-C. We discussed all aspects of the encounter. I agree with the assessment and plan as stated above.   Co-ox dropping off milrinone. Renal function worse. CVP stable. Feels ok though. Will continue to monitor off milrinone. If renal function and/or sx deteriorate will need Hospice. If stable can  got home on Wednesday. Continue amio for AF rate control.   Mikaiya Tramble,MD 3:58 PM

## 2016-06-24 NOTE — Progress Notes (Addendum)
Daily Progress Note   Patient Name: Cassandra Bonilla       Date: 06/24/2016 DOB: Aug 25, 1941  Age: 75 y.o. MRN#: [id number] Attending Physician: Albertine Patricia, MD Primary Care Physician: Marco Collie, MD Admit Date: 06/11/2016  Reason for Consultation/Follow-up: Establishing goals of care   Subjective: I met again with Cassandra Bonilla and her husband. She is sitting in the chair today and more alert and smiles at me. However, she is less talkative and seems to be "picking" at her pulse ox probe and does not really participate in conversation. Cassandra Bonilla was happy that their children have been able to visit. He continues to have good understanding of her poor prognosis. We revisit GOC and he does tell me that he and the children agree that they would like to see her go home. He would like for hospice to assist at home and will ask [facility] to discuss this with him. Now that she is on po meds and off gtts I think we can begin planning for this scenario. I did encourage him to consider when she declines more at home should we resuscitate and bring her back to the hospital or focus more on a dignified death and utilize hospice home. I do believe that he would prefer more comfort but holds back on making this decision final d/t his children believing she would want resuscitation and they view this as being the opportunity for a miracle to occur. I encourage him to think more about this.   Length of Stay: 13  Current Medications: Scheduled Meds:  . allopurinol  100 mg Oral Daily  . amiodarone  200 mg Oral BID  . benzonatate  100 mg Oral TID  . cholestyramine light  4 g Oral Q12H  . feeding supplement (GLUCERNA SHAKE)  237 mL Oral TID BM  . heparin  5,000 Units Subcutaneous Q8H  . hydrALAZINE  12.5 mg Oral Q8H  .  insulin aspart  0-20 Units Subcutaneous TID WC  . insulin aspart  0-5 Units Subcutaneous QHS  . insulin aspart  3 Units Subcutaneous TID WC  . insulin glargine  14 Units Subcutaneous BH-q7a  . ipratropium-albuterol  3 mL Nebulization TID  . isosorbide mononitrate  30 mg Oral Daily  . lactulose  20 g Oral BID  . mouth rinse  15 mL  Mouth Rinse BID  . methylPREDNISolone (SOLU-MEDROL) injection  40 mg Intravenous BID  . pantoprazole  40 mg Oral Daily  . rifaximin  400 mg Oral BID  . sodium chloride flush  10-40 mL Intracatheter Q12H  . sodium chloride flush  3 mL Intravenous Q12H  . torsemide  60 mg Oral BID    Continuous Infusions:    PRN Meds: sodium chloride, acetaminophen, albuterol, camphor-menthol, diphenhydrAMINE, glycopyrrolate, guaiFENesin-codeine, HYDROcodone-acetaminophen, hydrOXYzine, metoprolol, ondansetron **OR** ondansetron (ZOFRAN) IV, sodium chloride flush, sodium chloride flush  Physical Exam  Constitutional: She appears well-developed.  HENT:  Head: Normocephalic and atraumatic.  Cardiovascular: Normal rate.  An irregularly irregular rhythm present.  Pulmonary/Chest: Effort normal. No accessory muscle usage. No tachypnea. No respiratory distress.  Abdominal: Soft. Normal appearance.  Neurological: She is alert. She is disoriented.            Vital Signs: BP (!) 117/40 (BP Location: Left Arm)   Pulse 81   Temp 98.3 F (36.8 C) (Oral)   Resp 18   Ht _0  (1.575 m)   Wt 58.6 kg (129 lb 1.6 oz)   SpO2 99%   BMI 23.61 kg/m  SpO2: SpO2: 99 % O2 Device: O2 Device: Not Delivered O2 Flow Rate:    Intake/output summary:   Intake/Output Summary (Last 24 hours) at 06/24/16 1305 Last data filed at 06/24/16 0600  Gross per 24 hour  Intake           328.37 ml  Output             1955 ml  Net         -1626.63 ml   LBM: Last BM Date: 06/21/16 Baseline Weight: Weight: 73.9 kg (162 lb 14.7 oz) Most recent weight: Weight: 58.6 kg (129 lb 1.6 oz)        Palliative Assessment/Data: PPS: 20%   Flowsheet Rows   Flowsheet Row Most Recent Value  Intake Tab  Referral Department  Hospitalist  Unit at Time of Referral  ICU  Palliative Care Primary Diagnosis  Cardiac  Date Notified  06/15/16  Palliative Care Type  Return patient Palliative Care  Reason for referral  Clarify Goals of Care  Date of Admission  06/11/16  Date first seen by Palliative Care  06/17/16  # of days Palliative referral response time  2 Day(s)  # of days IP prior to Palliative referral  4  Clinical Assessment  Psychosocial & Spiritual Assessment  Palliative Care Outcomes      Patient Active Problem List   Diagnosis Date Noted  . DNR (do not resuscitate) discussion   . Gout 06/12/2016  . GERD (gastroesophageal reflux disease) 06/12/2016  . Essential hypertension 06/12/2016  . COPD exacerbation ([place]) 06/11/2016  . Hyperkalemia 06/11/2016  . Respiratory distress 06/11/2016  . Acute on chronic combined systolic and diastolic heart failure ([place]) 06/11/2016  . Palliative care encounter   . Cryptogenic cirrhosis of liver ([place])   . Pulmonary hypertension   . GIB (gastrointestinal bleeding) 05/24/2016  . Encephalopathy, hepatic ([place]) 05/24/2016  . GI bleed 05/24/2016  . Abnormal alkaline phosphatase test   . Acute encephalopathy 04/22/2016  . Pruritus 03/22/2016  . Anemia of chronic disease 03/22/2016  . Chronic atrial fibrillation ([place]) 03/22/2016  . Type 2 diabetes mellitus with stage 4 chronic kidney disease, with long-term current use of insulin ([place]) 03/22/2016  . CKD (chronic kidney disease), stage IV ([place]) 03/22/2016    Palliative Care Assessment & Plan   Patient Profile:  75 y.o. female  with past medical history of severe COPD, combined systolic/diastolic heart failure EF 40-45%, pulmonary hypertension, chronic atrial fibrillation, CAD s/p CABG, diabetes mellitus, CKD IV, cryptogenic cirrhosis  admitted on 06/11/2016 with shortness of breath r/t  underlying COPD with severe biventricular disease. She is requiring inotropic support, Lasix infusion, amiodarone infusion with continued volume overload.    Assessment: Sitting up in recliner. No distress. Pleasant but very quiet today and distracted by medical equipment.   Recommendations/Plan:  Volume overload: Tolerating on room air currently. Manage per heart failure team.   Wheezing: Per RT and primary team.   Itching: Continue Sarna lotion - she says this helps. Benadryl and Vistaril prn. Likely r/t cirrhosis.    With any respiratory decline I would opt for fentanyl or dilaudid to provide comfort over morphine d/t renal disease.   Goals of Care and Additional Recommendations:  Limitations on Scope of Treatment: Full Scope Treatment - husband considering limitations  Code Status:    Code Status Orders        Start     Ordered   06/11/16 1617  Full code  Continuous     06/11/16 1621    Code Status History    Date Active Date Inactive Code Status Order ID Comments User Context   05/24/2016  7:48 AM 06/04/2016  4:29 PM Full Code [id number]  Reyne Dumas, MD Inpatient   04/20/2016  9:19 PM 05/03/2016  7:07 PM Full Code [id number]  Edwin Dada, MD Inpatient   03/22/2016  3:02 AM 03/24/2016  8:19 PM Full Code [id number]  Norval Morton, MD ED       Prognosis:   < 2 weeks likely if shifting more to comfort care and with worsening renal failure.   Discharge Planning:  To Be Determined   Thank you for allowing the Palliative Medicine Team to assist in the care of this patient.   Time In: 1110 Time Out: 1135 Total Time 18mn Prolonged Time Billed  no       Greater than 50%  of this time was spent counseling and coordinating care related to the above assessment and plan.  PPershing Proud NP  Please contact Palliative Medicine Team phone at [phone] questions and concerns.

## 2016-06-25 DIAGNOSIS — Z66 Do not resuscitate: Secondary | ICD-10-CM

## 2016-06-25 LAB — BASIC METABOLIC PANEL
Anion gap: 17 — ABNORMAL HIGH (ref 5–15)
BUN: 135 mg/dL — AB (ref 6–20)
CHLORIDE: 88 mmol/L — AB (ref 101–111)
CO2: 28 mmol/L (ref 22–32)
CREATININE: 4.02 mg/dL — AB (ref 0.44–1.00)
Calcium: 8.9 mg/dL (ref 8.9–10.3)
GFR calc Af Amer: 12 mL/min — ABNORMAL LOW (ref >60)
GFR calc non Af Amer: 10 mL/min — ABNORMAL LOW (ref >60)
Glucose, Bld: 176 mg/dL — ABNORMAL HIGH (ref 65–99)
Potassium: 3.1 mmol/L — ABNORMAL LOW (ref 3.5–5.1)
SODIUM: 133 mmol/L — AB (ref 135–145)

## 2016-06-25 LAB — GLUCOSE, CAPILLARY
GLUCOSE-CAPILLARY: 209 mg/dL — AB (ref 65–99)
GLUCOSE-CAPILLARY: 189 mg/dL — AB (ref 65–99)
Glucose-Capillary: 188 mg/dL — ABNORMAL HIGH (ref 65–99)
Glucose-Capillary: 95 mg/dL (ref 65–99)

## 2016-06-25 LAB — CARBOXYHEMOGLOBIN
Carboxyhemoglobin: 1.8 % — ABNORMAL HIGH (ref 0.5–1.5)
Methemoglobin: 1.1 % (ref 0.0–1.5)
O2 SAT: 59.1 %
TOTAL HEMOGLOBIN: 8.2 g/dL — AB (ref 12.0–16.0)

## 2016-06-25 MED ORDER — IPRATROPIUM-ALBUTEROL 0.5-2.5 (3) MG/3ML IN SOLN: 3.0000 mL | RESPIRATORY_TRACT | Status: DC | PRN

## 2016-06-25 MED ORDER — POTASSIUM CHLORIDE CRYS ER 20 MEQ PO TBCR
40.0000 meq | EXTENDED_RELEASE_TABLET | Freq: Once | ORAL | Status: DC
  Administered 2016-06-25: 40 meq via ORAL

## 2016-06-25 MED ORDER — PREDNISONE 20 MG PO TABS
30.0000 mg | ORAL_TABLET | Freq: Every day | ORAL | Status: DC
  Administered 2016-06-25: 30 mg via ORAL
  Filled 2016-06-25: qty 1

## 2016-06-25 MED ORDER — AMIODARONE HCL 200 MG PO TABS
200.0000 mg | ORAL_TABLET | Freq: Every day | ORAL | Status: DC
  Administered 2016-06-25 – 2016-06-27 (×3): 200 mg via ORAL
  Filled 2016-06-25 (×3): qty 1

## 2016-06-25 MED ORDER — PREDNISONE 20 MG PO TABS
20.0000 mg | ORAL_TABLET | Freq: Every day | ORAL | Status: DC
  Administered 2016-06-26 – 2016-06-27 (×2): 20 mg via ORAL
  Filled 2016-06-25 (×2): qty 1

## 2016-06-25 MED ORDER — TORSEMIDE 20 MG PO TABS
40.0000 mg | ORAL_TABLET | Freq: Two times a day (BID) | ORAL | Status: DC
  Administered 2016-06-26 – 2016-06-27 (×4): 40 mg via ORAL
  Filled 2016-06-25 (×4): qty 2

## 2016-06-25 MED ORDER — POTASSIUM CHLORIDE CRYS ER 10 MEQ PO TBCR
EXTENDED_RELEASE_TABLET | ORAL | Status: AC
  Administered 2016-06-25: 40 meq via ORAL
  Filled 2016-06-25: qty 4

## 2016-06-25 MED ORDER — POTASSIUM CHLORIDE CRYS ER 20 MEQ PO TBCR
40.0000 meq | EXTENDED_RELEASE_TABLET | Freq: Once | ORAL | Status: AC
  Administered 2016-06-25: 40 meq via ORAL
  Filled 2016-06-25: qty 2

## 2016-06-25 MED ORDER — FENTANYL CITRATE (PF) 100 MCG/2ML IJ SOLN
25.0000 ug | INTRAMUSCULAR | Status: DC | PRN
  Administered 2016-06-25: 25 ug via INTRAVENOUS
  Filled 2016-06-25: qty 2

## 2016-06-25 NOTE — Progress Notes (Addendum)
1:15pm At this time hospice does not see symptom management needs but will reassess tomorrow.  8:30am CSW informed that patient family now considering residential hospice because they feel as if pt would be too much to handle at home- they prefer [facility].  CSW contacted [facility]- they already have meeting to meet with patient at 10:30am this morning to discuss home hospice but will now assess for Residential placement  CSW will continue to follow and assist with transfer to residential facility if appropriate.  Jorge Ny, [place] Social Worker [phone]

## 2016-06-25 NOTE — Progress Notes (Signed)
Patient ID: Cassandra Bonilla, female   DOB: 05-30-1941, 75 y.o.   MRN: [id number]   Patient Name: Cassandra Bonilla Date of Encounter: 06/25/2016  Primary Cardiologist: Dr. Oval Linsey    Subjective   Chronically ill woman with severe COPD and mixed systolic/diastolic HF and pulmonary HTN. Underwent RHC which showed severely elevated biventricular pressures with low cardiac output and RV strain. Moved to SDU. PICC placed and milrinone started. Increased lasix.   Coox 59% this am OFF milrinone. Creatinine elevated but stable. 3.1 > 3.4 > 3.6 > 3.9 > 4.02. CVP 11-12. On torsemide 60 mg po BID  Coughing this morning. Coughing up white/yellow.  Fatigued.  Denies DOE at rest. Slightly tremulous.   Out 2.1 L. Unable to weight standing.  Bed weights inaccurate.   [place] 06/13/16 RA = 30 RV = 68/26 PA = 69/25 (43) PCW = 33 Fick cardiac output/index = 3.7/2.1 Thermo CO/CI = 4.0/2.3 PVR = 2.5 WU Ao sat = 99% PA sat = 45%, 46%  Echo (9/17) with EF 40-45%, PASP 75, RV mildly dilated/mild-moderately decreased systolic function, severe TR.   Inpatient Medications    Scheduled Meds: . allopurinol  100 mg Oral Daily  . amiodarone  200 mg Oral BID  . benzonatate  100 mg Oral TID  . cholestyramine light  4 g Oral Q12H  . feeding supplement (GLUCERNA SHAKE)  237 mL Oral TID BM  . heparin  5,000 Units Subcutaneous Q8H  . hydrALAZINE  12.5 mg Oral Q8H  . insulin aspart  0-20 Units Subcutaneous TID WC  . insulin aspart  0-5 Units Subcutaneous QHS  . insulin aspart  3 Units Subcutaneous TID WC  . insulin glargine  14 Units Subcutaneous BH-q7a  . ipratropium-albuterol  3 mL Nebulization TID  . isosorbide mononitrate  30 mg Oral Daily  . lactulose  20 g Oral BID  . mouth rinse  15 mL Mouth Rinse BID  . methylPREDNISolone (SOLU-MEDROL) injection  40 mg Intravenous BID  . pantoprazole  40 mg Oral Daily  . potassium chloride  40 mEq Oral Once  . rifaximin  400 mg Oral BID  . sodium chloride flush  10-40 mL  Intracatheter Q12H  . sodium chloride flush  3 mL Intravenous Q12H  . torsemide  60 mg Oral BID   Continuous Infusions:   PRN Meds:.sodium chloride, acetaminophen, albuterol, camphor-menthol, diphenhydrAMINE, glycopyrrolate, guaiFENesin-codeine, HYDROcodone-acetaminophen, hydrOXYzine, metoprolol, ondansetron **OR** ondansetron (ZOFRAN) IV, sodium chloride flush, sodium chloride flush   Vital Signs    Vitals:   06/25/16 0000 06/25/16 0015 06/25/16 0400 06/25/16 0710  BP:  (!) 125/27 (!) 124/44   Pulse: 90 91 86   Resp: (!) 24 (!) 21 17   Temp:   97.7 F (36.5 C)   TempSrc:   Axillary   SpO2: 100% 99% 94% 93%  Weight:   119 lb (54 kg)   Height:        Intake/Output Summary (Last 24 hours) at 06/25/16 0748 Last data filed at 06/25/16 0600  Gross per 24 hour  Intake              200 ml  Output             2300 ml  Net            -2100 ml   Filed Weights   06/23/16 0330 06/24/16 0400 06/25/16 0400  Weight: 141 lb (64 kg) 129 lb 1.6 oz (58.6 kg) 119 lb (54 kg)    Physical  Exam  CVP 11-12 GEN: Elderly and chronically ill appearing.  HEENT: Normal Neck: Supple, JVP M/G/R. No clubbing, cyanosis, edema.  Radials/DP/PT 2+ and equal bilaterally.  Respiratory:  Diminished basilar sounds. GI: Soft, NT, ND, no HSM. No bruits or masses. +BS  MS: no deformity or atrophy. RUE PICC  Skin: warm and dry, no rash. Neuro:  Alert, conversant. Bilateral LE weakness. Moves upper extremities without difficulty  Labs    CBC No results for input(s): WBC, NEUTROABS, HGB, HCT, MCV, PLT in the last 72 hours. Basic Metabolic Panel  Recent Labs  06/23/16 0400 06/24/16 0430 06/25/16 0355  NA 127* 132* 133*  K 3.2* 4.0 3.1*  CL 86* 88* 88*  CO2 25 28 28   GLUCOSE 220* 204* 176*  BUN 118* 127* 135*  CREATININE 3.67* 3.93* 4.02*  CALCIUM 8.0* 8.7* 8.9  MG 1.5* 2.1  --    Liver Function Tests No results for input(s): AST, ALT, ALKPHOS, BILITOT, PROT, ALBUMIN in the last 72 hours. Cardiac  Enzymes No results for input(s): CKTOTAL, CKMB, CKMBINDEX, TROPONINI in the last 72 hours.  Telemetry    Reviewed personally, Afib 80s.   Radiology    No results found.   Cardiac Studies   Transthoracic Echocardiography 05/26/16 Study Conclusions  - Left ventricle: The cavity size was normal. Wall thickness was   increased in a pattern of mild LVH. Systolic function was mildly   to moderately reduced. The estimated ejection fraction was in the   range of 40% to 45%. - Ventricular septum: The contour showed diastolic flattening and   systolic flattening. - Aortic valve: Severely calcified annulus. - Mitral valve: There was mild to moderate regurgitation. - Left atrium: The atrium was moderately dilated. - Right ventricle: The cavity size was mildly dilated. Systolic   function was mildly to moderately reduced. - Right atrium: The atrium was mildly dilated. - Tricuspid valve: There was severe regurgitation. - Pulmonary arteries: Systolic pressure was severely increased. PA   peak pressure: 75 mm Hg (S).  Patient Profile  75 yo female with PMH of chronic systolic and diastolic heart failure (LVEF 40-45%), chronic atrial fibrillation COPD, carotid stenosis s/p CEA, CAD s/p CABG, diabetes mellitus, CKD 4, GI bleed, cryptogenic cirrhosis, and severe pulmonary hypertension who presented with worsening dyspnea.      Assessment   1. Acute on chronic combined biventricular failure: Likely end stage 2. Pulmonary HTN: Looks like primarily pulmonary venous hypertension.  3. Severe COPD 4. AKI on CKD IV 5. Hx of hepatic encephalopathy and cryptogenic cirrhosis 6. Chronic atrial fibrillation.   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   CVP 11-12 propped up in bed. Coox 59% off milrinone.    Continue torsemide 60 mg po BID. Supp K.  Creatinine relatively stable, BUN continues to worsen. She is excreting but not filtrating.    Suspect that prognosis may be < 2 weeks if  continues on current trajectory.  Pt remains FULL CODE.  Will need to discuss with family prior to d/c.     Chronic A-fib.  Rate controlled currently. Continue amio 200 mg BID. Off AC with cirrhosis.     Pt is chronically and critically ill in multiple organ failure.  Very poor prognosis.   Could potentially get out to residential hospice today if family in agreement with full comfort care.  Otherwise will need to continue to watch overnight.  Do not think home is best environment for her at this stage.  Shirley Friar, PA-C 06/25/2016, 7:48 AM   Advanced Heart Failure Team Pager [phone] (M-F; 7a - 4p)  Please contact [facility] for night-coverage after hours (4p -7a ) and weekends on [email or web address]  Patient seen and examined with Oda Kilts, PA-C. We discussed all aspects of the encounter. I agree with the assessment and plan as stated above.   She is as dry as we are going to get her. Co-ox ok. Creatinine plateaued but BUN still increasing. Would hold torsemide today and send home tomorrow with hospice on torsemide 40 bid.  Decrease amio to 200 daily.   Archita Lomeli,MD 9:06 AM

## 2016-06-25 NOTE — Progress Notes (Signed)
PROGRESS NOTE  Cassandra Bonilla [id number] DOB: November 10, 1940 DOA: 06/11/2016 PCP: Marco Collie, MD  HPI/Recap of past 25 hours: 75 year old female past mental history of COPD, CAD and diabetes mellitus admitted on 9/26 5 days of progressively worsening shortness of breath and cough and found to have both COPD and CHF exacerbation. Patient was transferred to step down unit for placement of PICC line and starting of milrinone drip. Unfortunately, with her worsening heart failure, she has had little response to maximized therapy including amiodarone and Lasix continual infusions and milrinone. Currently transitioned to oral medication Palliative care was consulted for goals of care, family was changed today to DO NOT RESUSCITATE, plan discharge with hospice  Assessment/Plan:   Acute on chronic combined systolic and diastolic heart failure [facility]):  - Status post right heart catheterization on 9/28. 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 - Despite aggressive measures, prognosis remains very poor. Palliative care has been consulted, discussed goals of care with the patient's husband, and plan to proceed with discharge to residential hospice when bed is available.  Chronic atrial fibrillation ([place]):  - Chads 2 score of 6. On Amiodarone and beta blocker. No anticoagulation currently given recent GI bleed and cirrhosis.   Type 2 diabetes mellitus  - with long-term current use of insulin, -  A1c last month at 7.4. Continue with Lantus and insulin sliding scale.   CKD (chronic kidney disease), stage IV ([place]):  - Creatinine trending up gradually, today is 4  diuresis change to by mouth.   Encephalopathy,  - hepatic ([place]) with secondary Cryptogenic cirrhosis of liver ([place]):  Xifaxan, lactulose    COPD exacerbation ([place]):  - Continue on DuoNeb's, s/p Levaquin. Continue with the steroid taper  hyponatremia - Due to diuresis, monitor closely  Hypokalemia/hypomagnesemia - Repleted,   Goals of care - Palliative medicine assistance greatly appreciated, patient changed to DO NOT RESUSCITATE today, with plan to discharge with residential hospice at [facility] when bed is available. - Leave PICC line on  discharge in case she needs IV medication for comfort.     DVT prophylaxis: [place] Heparin  Code Status: DNR  Family Communication: None at bedside  Disposition Plan: [facility] residential hospice when bed is available  Consultants:  Cardiology  Palliative care   Gastroenterology  Procedures:  Right heart catheterization done 9/28  Antimicrobials:  Levaquin 9/26-10/5   DVT prophylaxis: SCDs   Objective: Vitals:   06/25/16 0400 06/25/16 0710 06/25/16 0759 06/25/16 1121  BP: (!) 124/44  (!) 126/50 (!) 111/54  Pulse: 86  82 85  Resp: 17  11 14   Temp: 97.7 F (36.5 C)  98.2 F (36.8 C) 98 F (36.7 C)  TempSrc: Axillary  Oral Oral  SpO2: 94% 93% 95% 94%  Weight: 54 kg (119 lb)     Height:        Intake/Output Summary (Last 24 hours) at 06/25/16 1450 Last data filed at 06/25/16 0600  Gross per 24 hour  Intake              200 ml  Output             2300 ml  Net            -  2100 ml   Filed Weights   06/23/16 0330 06/24/16 0400 06/25/16 0400  Weight: 64 kg (141 lb) 58.6 kg (129 lb 1.6 oz) 54 kg (119 lb)    Exam:   General:  Awake, alert, communicative and pleasant  Cardiovascular: Irregular rhythm, Borderline tachycardia, 3/6 systolic ejection murmur   Respiratory: Rhales, decreased breath sounds throughout   Abdomen: Soft, nontender, nondistended, positive bowel sounds   Musculoskeletal: No clubbing or  cyanosis, Trace  edema   Skin: No skin breaks, tears or lesions- skin on buttocks irritated by diarrhea  Data Reviewed: CBC:  Recent Labs Lab 06/19/16 0500 06/20/16 0615  WBC 6.4 6.1  NEUTROABS 5.9 5.6  HGB 7.9* 8.8*  HCT 25.8* 28.5*  MCV 81.9 80.3  PLT 136* 0000000   Basic Metabolic Panel:  Recent Labs Lab 06/21/16 0421 06/22/16 0910 06/23/16 0400 06/24/16 0430 06/25/16 0355  NA 127* 128* 127* 132* 133*  K 3.1* 3.3* 3.2* 4.0 3.1*  CL 89* 89* 86* 88* 88*  CO2 23 24 25 28 28   GLUCOSE 250* 259* 220* 204* 176*  BUN 112* 117* 118* 127* 135*  CREATININE 3.38* 3.66* 3.67* 3.93* 4.02*  CALCIUM 8.4* 7.9* 8.0* 8.7* 8.9  MG  --   --  1.5* 2.1  --    GFR: Estimated Creatinine Clearance: 9.6 mL/min (by C-G formula based on SCr of 4.02 mg/dL (H)). Liver Function Tests: No results for input(s): AST, ALT, ALKPHOS, BILITOT, PROT, ALBUMIN in the last 168 hours. No results for input(s): LIPASE, AMYLASE in the last 168 hours.  Recent Labs Lab 06/24/16 0500  AMMONIA 47*   Coagulation Profile: No results for input(s): INR, PROTIME in the last 168 hours. Cardiac Enzymes: No results for input(s): CKTOTAL, CKMB, CKMBINDEX, TROPONINI in the last 168 hours. BNP (last 3 results) No results for input(s): PROBNP in the last 8760 hours. HbA1C: No results for input(s): HGBA1C in the last 72 hours. CBG:  Recent Labs Lab 06/24/16 1209 06/24/16 1607 06/24/16 2141 06/25/16 0800 06/25/16 1122  GLUCAP 172* 147* 84 209* 188*   Lipid Profile: No results for input(s): CHOL, HDL, LDLCALC, TRIG, CHOLHDL, LDLDIRECT in the last 72 hours. Thyroid Function Tests: No results for input(s): TSH, T4TOTAL, FREET4, T3FREE, THYROIDAB in the last 72 hours. Anemia Panel: No results for input(s): VITAMINB12, FOLATE, FERRITIN, TIBC, IRON, RETICCTPCT in the last 72 hours. Urine analysis:    Component Value Date/Time   COLORURINE YELLOW 05/25/2016 [place] 05/25/2016 1725   LABSPEC  1.014 05/25/2016 1725   PHURINE 6.0 05/25/2016 1725   GLUCOSEU NEGATIVE 05/25/2016 1725   HGBUR NEGATIVE 05/25/2016 1725   BILIRUBINUR NEGATIVE 05/25/2016 1725   KETONESUR NEGATIVE 05/25/2016 1725   PROTEINUR NEGATIVE 05/25/2016 1725   NITRITE NEGATIVE 05/25/2016 [place] 05/25/2016 1725      No results found for this or any previous visit (from the past 240 hour(s)).    Studies: No results found.  Scheduled Meds: . allopurinol  100 mg Oral Daily  . amiodarone  200 mg Oral Daily  . benzonatate  100 mg Oral TID  . cholestyramine light  4 g Oral Q12H  . feeding supplement (GLUCERNA SHAKE)  237 mL Oral TID BM  . heparin  5,000 Units Subcutaneous Q8H  . hydrALAZINE  12.5 mg Oral Q8H  . insulin aspart  0-20 Units Subcutaneous TID WC  . insulin aspart  0-5 Units Subcutaneous QHS  . insulin aspart  3 Units Subcutaneous TID WC  .  insulin glargine  14 Units Subcutaneous BH-q7a  . isosorbide mononitrate  30 mg Oral Daily  . lactulose  20 g Oral BID  . mouth rinse  15 mL Mouth Rinse BID  . pantoprazole  40 mg Oral Daily  . predniSONE  30 mg Oral Q breakfast  . rifaximin  400 mg Oral BID  . sodium chloride flush  10-40 mL Intracatheter Q12H  . sodium chloride flush  3 mL Intravenous Q12H  . [START ON 06/26/2016] torsemide  40 mg Oral BID    Continuous Infusions:     LOS: 14 days    Ezella Kell, MD [facility] Pager [phone]  If 7PM-7AM, please contact night-coverage [email or web address] Password [email or web address] 06/25/2016, 2:50 PM

## 2016-06-25 NOTE — Progress Notes (Signed)
Daily Progress Note   Patient Name: Cassandra Bonilla       Date: 06/25/2016 DOB: [birth year]  Age: 75 y.o. MRN#: [id number] Attending Physician: Albertine Patricia, MD Primary Care Physician: Marco Collie, MD Admit Date: 06/11/2016  Reason for Consultation/Follow-up: Establishing goals of care   Subjective: I met again today with Cassandra Bonilla, Cassandra Bonilla, along with hospice representative. Cassandra Bonilla now confirms that he has had time to discuss with his children and they agree now that everything has been done and are more accepting of DNR and hospice. He confirms DNR at this time. Also understands that hospice would be more a shift to comfort and is accepting of this to get her closer to home.   He did want to take her home but he said his children concerned about this. I agree as Cassandra Bonilla could decline very quickly and I think this could be very overwhelming for them. Unfortunately hospice has no current routine level beds and unless Cassandra Bonilla has active symptoms to manage they cannot accept her currently. Will reassess tomorrow and discuss again with hospice.   Length of Stay: 14  Current Medications: Scheduled Meds:  . allopurinol  100 mg Oral Daily  . amiodarone  200 mg Oral Daily  . benzonatate  100 mg Oral TID  . cholestyramine light  4 g Oral Q12H  . feeding supplement (GLUCERNA SHAKE)  237 mL Oral TID BM  . heparin  5,000 Units Subcutaneous Q8H  . hydrALAZINE  12.5 mg Oral Q8H  . insulin aspart  0-20 Units Subcutaneous TID WC  . insulin aspart  0-5 Units Subcutaneous QHS  . insulin aspart  3 Units Subcutaneous TID WC  . insulin glargine  14 Units Subcutaneous BH-q7a  . ipratropium-albuterol  3 mL Nebulization TID  . isosorbide mononitrate  30 mg Oral Daily  . lactulose  20 g Oral BID  . mouth rinse   15 mL Mouth Rinse BID  . pantoprazole  40 mg Oral Daily  . predniSONE  30 mg Oral Q breakfast  . rifaximin  400 mg Oral BID  . sodium chloride flush  10-40 mL Intracatheter Q12H  . sodium chloride flush  3 mL Intravenous Q12H  . [START ON 06/26/2016] torsemide  40 mg Oral BID    Continuous Infusions:    PRN Meds: sodium  chloride, acetaminophen, albuterol, camphor-menthol, diphenhydrAMINE, glycopyrrolate, guaiFENesin-codeine, HYDROcodone-acetaminophen, hydrOXYzine, metoprolol, ondansetron **OR** ondansetron (ZOFRAN) IV, sodium chloride flush, sodium chloride flush  Physical Exam  Constitutional: Cassandra Bonilla appears well-developed. Cassandra Bonilla appears lethargic.  HENT:  Head: Normocephalic and atraumatic.  Cardiovascular: Normal rate.  An irregularly irregular rhythm present.  Pulmonary/Chest: Effort normal. No accessory muscle usage. No tachypnea. No respiratory distress.  Abdominal: Soft. Normal appearance.  Neurological: Cassandra Bonilla appears lethargic. Cassandra Bonilla is disoriented.            Vital Signs: BP (!) 111/54 (BP Location: Left Arm)   Pulse 85   Temp 98 F (36.7 C) (Oral)   Resp 14   Ht 5' 2"  (1.575 m)   Wt 54 kg (119 lb)   SpO2 94%   BMI 21.77 kg/m  SpO2: SpO2: 94 % O2 Device: O2 Device: Not Delivered O2 Flow Rate:    Intake/output summary:   Intake/Output Summary (Last 24 hours) at 06/25/16 1145 Last data filed at 06/25/16 0600  Gross per 24 hour  Intake              200 ml  Output             2300 ml  Net            -2100 ml   LBM: Last BM Date: 06/23/16 Baseline Weight: Weight: 73.9 kg (162 lb 14.7 oz) Most recent weight: Weight: 54 kg (119 lb)       Palliative Assessment/Data: PPS: 20%   Flowsheet Rows   Flowsheet Row Most Recent Value  Intake Tab  Referral Department  Hospitalist  Unit at Time of Referral  ICU  Palliative Care Primary Diagnosis  Cardiac  Date Notified  06/15/16  Palliative Care Type  Return patient Palliative Care  Reason for referral  Clarify Goals of  Care  Date of Admission  06/11/16  Date first seen by Palliative Care  06/17/16  # of days Palliative referral response time  2 Day(s)  # of days IP prior to Palliative referral  4  Clinical Assessment  Psychosocial & Spiritual Assessment  Palliative Care Outcomes      Patient Active Problem List   Diagnosis Date Noted  . AKI (acute kidney injury) ([place])   . DNR (do not resuscitate) discussion   . Gout 06/12/2016  . GERD (gastroesophageal reflux disease) 06/12/2016  . Essential hypertension 06/12/2016  . COPD exacerbation ([place]) 06/11/2016  . Hyperkalemia 06/11/2016  . Respiratory distress 06/11/2016  . Acute on chronic combined systolic and diastolic heart failure ([place]) 06/11/2016  . Palliative care encounter   . Cryptogenic cirrhosis of liver ([place])   . Pulmonary hypertension   . GIB (gastrointestinal bleeding) 05/24/2016  . Encephalopathy, hepatic ([place]) 05/24/2016  . GI bleed 05/24/2016  . Abnormal alkaline phosphatase test   . Acute encephalopathy 04/22/2016  . Pruritus 03/22/2016  . Anemia of chronic disease 03/22/2016  . Chronic atrial fibrillation ([place]) 03/22/2016  . Type 2 diabetes mellitus with stage 4 chronic kidney disease, with long-term current use of insulin ([place]) 03/22/2016  . CKD (chronic kidney disease), stage IV ([place]) 03/22/2016    Palliative Care Assessment & Plan   Patient Profile: 75 y.o. female  with past medical history of severe COPD, combined systolic/diastolic heart failure EF 40-45%, pulmonary hypertension, chronic atrial fibrillation, CAD s/p CABG, diabetes mellitus, CKD IV, cryptogenic cirrhosis  admitted on 06/11/2016 with shortness of breath r/t underlying COPD with severe biventricular disease. Cassandra Bonilla is requiring inotropic support, Lasix infusion, amiodarone infusion  with continued volume overload.    Assessment: Lying in bed. No distress. More lethargic today again. Creatinine continues to rise.   Recommendations/Plan:  Volume overload:  Tolerating on room air currently. Manage per heart failure team. Likely optimized at this point.   Itching: Continue Sarna lotion. Benadryl and Vistaril prn. Likely r/t cirrhosis.    Dyspnea/pain: Fentanyl 25 mcg every hour prn.   Goals of Care and Additional Recommendations:  Limitations on Scope of Treatment: Comfort Focus  Code Status:  DNR  Prognosis:   < 2 weeks with worsening renal failure.   Discharge Planning:  To Be Determined   Thank you for allowing the Palliative Medicine Team to assist in the care of this patient.   Time In: 1045 Time Out: 1145 Total Time 56mn Prolonged Time Billed  no       Greater than 50%  of this time was spent counseling and coordinating care related to the above assessment and plan.  PPershing Proud NP  Please contact Palliative Medicine Team phone at [phone] questions and concerns.

## 2016-06-26 LAB — CARBOXYHEMOGLOBIN
CARBOXYHEMOGLOBIN: 2 % — AB (ref 0.5–1.5)
Methemoglobin: 1 % (ref 0.0–1.5)
O2 SAT: 58.5 %
Total hemoglobin: 7.7 g/dL — ABNORMAL LOW (ref 12.0–16.0)

## 2016-06-26 LAB — BASIC METABOLIC PANEL
ANION GAP: 16 — AB (ref 5–15)
BUN: 134 mg/dL — ABNORMAL HIGH (ref 6–20)
CALCIUM: 8.8 mg/dL — AB (ref 8.9–10.3)
CHLORIDE: 86 mmol/L — AB (ref 101–111)
CO2: 28 mmol/L (ref 22–32)
CREATININE: 3.93 mg/dL — AB (ref 0.44–1.00)
GFR calc non Af Amer: 10 mL/min — ABNORMAL LOW (ref >60)
GFR, EST AFRICAN AMERICAN: 12 mL/min — AB (ref >60)
GLUCOSE: 174 mg/dL — AB (ref 65–99)
Potassium: 3.8 mmol/L (ref 3.5–5.1)
Sodium: 130 mmol/L — ABNORMAL LOW (ref 135–145)

## 2016-06-26 LAB — GLUCOSE, CAPILLARY: Glucose-Capillary: 169 mg/dL — ABNORMAL HIGH (ref 65–99)

## 2016-06-26 NOTE — Care Management Important Message (Signed)
Important Message  Patient Details  Name: Cassandra Bonilla MRN: [id number] Date of Birth: [birth year]   Medicare Important Message Given:  Yes    Erenest Rasher, RN 06/26/2016, 10:30 AM

## 2016-06-26 NOTE — Progress Notes (Signed)
   Patient unavailable (sleeping).  Left note at bedside.  Will follow, as needed.  -Rev. [place] MDiv ThM

## 2016-06-26 NOTE — Progress Notes (Signed)
Patient ID: Cassandra Bonilla, female   DOB: 1941/02/19, 75 y.o.   MRN: [id number]   Patient Name: Cassandra Bonilla Date of Encounter: 06/26/2016  Primary Cardiologist: Dr. Oval Linsey    Subjective   Chronically ill woman with severe COPD and mixed systolic/diastolic HF and pulmonary HTN. Underwent RHC which showed severely elevated biventricular pressures with low cardiac output and RV strain. Moved to SDU. PICC placed and milrinone started. Increased lasix.   Coox 58.5% this am OFF milrinone. Creatinine elevated but stable. 3.1 > 3.4 > 3.6 > 3.9 > 4.02 -> 3.93. CVP 10-11. Torsemide held yesterday. Starting back on 40 mg BID today.   Sleepy this am.  Denies DOE at rest.   Out 2.3 L. Weights continue to decrease. ? Accuracy as all are bed weights.    [place] 06/13/16 RA = 30 RV = 68/26 PA = 69/25 (43) PCW = 33 Fick cardiac output/index = 3.7/2.1 Thermo CO/CI = 4.0/2.3 PVR = 2.5 WU Ao sat = 99% PA sat = 45%, 46%  Echo (9/17) with EF 40-45%, PASP 75, RV mildly dilated/mild-moderately decreased systolic function, severe TR.   Inpatient Medications    Scheduled Meds: . allopurinol  100 mg Oral Daily  . amiodarone  200 mg Oral Daily  . benzonatate  100 mg Oral TID  . cholestyramine light  4 g Oral Q12H  . feeding supplement (GLUCERNA SHAKE)  237 mL Oral TID BM  . heparin  5,000 Units Subcutaneous Q8H  . hydrALAZINE  12.5 mg Oral Q8H  . insulin aspart  0-20 Units Subcutaneous TID WC  . insulin aspart  0-5 Units Subcutaneous QHS  . insulin aspart  3 Units Subcutaneous TID WC  . insulin glargine  14 Units Subcutaneous BH-q7a  . isosorbide mononitrate  30 mg Oral Daily  . lactulose  20 g Oral BID  . mouth rinse  15 mL Mouth Rinse BID  . pantoprazole  40 mg Oral Daily  . predniSONE  20 mg Oral Q breakfast  . rifaximin  400 mg Oral BID  . sodium chloride flush  10-40 mL Intracatheter Q12H  . sodium chloride flush  3 mL Intravenous Q12H  . torsemide  40 mg Oral BID   Continuous Infusions:   PRN Meds:.sodium chloride, acetaminophen, camphor-menthol, diphenhydrAMINE, fentaNYL (SUBLIMAZE) injection, glycopyrrolate, guaiFENesin-codeine, HYDROcodone-acetaminophen, hydrOXYzine, ipratropium-albuterol, metoprolol, ondansetron **OR** ondansetron (ZOFRAN) IV, sodium chloride flush, sodium chloride flush   Vital Signs    Vitals:   06/26/16 0400 06/26/16 0412 06/26/16 0700 06/26/16 0744  BP: (!) 135/45 (!) 135/45  (!) 151/40  Pulse: 82 83 78 80  Resp: 13 13 11 13   Temp:  98 F (36.7 C)  97.9 F (36.6 C)  TempSrc:  Oral  Oral  SpO2: 100% 100% 100% 99%  Weight:  114 lb (51.7 kg)    Height:        Intake/Output Summary (Last 24 hours) at 06/26/16 0750 Last data filed at 06/26/16 0600  Gross per 24 hour  Intake              450 ml  Output             3025 ml  Net            -2575 ml   Filed Weights   06/24/16 0400 06/25/16 0400 06/26/16 0412  Weight: 129 lb 1.6 oz (58.6 kg) 119 lb (54 kg) 114 lb (51.7 kg)    Physical Exam  CVP 10-11 GEN: Elderly and chronically ill appearing.  HEENT: Normal Neck: Supple, JVP M/G/R. No clubbing, cyanosis, edema.  Radials/DP/PT 2+ and equal bilaterally.  Respiratory:  Diminished basilar sounds. GI: Soft, NT, ND, no HSM. No bruits or masses. +BS  MS: no deformity or atrophy. RUE PICC  Skin: warm and dry, no rash. Neuro:  Alert, conversant. Bilateral LE weakness. Moves upper extremities without difficulty  Labs    CBC No results for input(s): WBC, NEUTROABS, HGB, HCT, MCV, PLT in the last 72 hours. Basic Metabolic Panel  Recent Labs  06/24/16 0430 06/25/16 0355 06/26/16 0435  NA 132* 133* 130*  K 4.0 3.1* 3.8  CL 88* 88* 86*  CO2 28 28 28   GLUCOSE 204* 176* 174*  BUN 127* 135* 134*  CREATININE 3.93* 4.02* 3.93*  CALCIUM 8.7* 8.9 8.8*  MG 2.1  --   --    Liver Function Tests No results for input(s): AST, ALT, ALKPHOS, BILITOT, PROT, ALBUMIN in the last 72 hours. Cardiac Enzymes No results for input(s): CKTOTAL, CKMB,  CKMBINDEX, TROPONINI in the last 72 hours.  Telemetry    Reviewed personally, Afib 80s.   Radiology    No results found.   Cardiac Studies   Transthoracic Echocardiography 05/26/16 Study Conclusions  - Left ventricle: The cavity size was normal. Wall thickness was   increased in a pattern of mild LVH. Systolic function was mildly   to moderately reduced. The estimated ejection fraction was in the   range of 40% to 45%. - Ventricular septum: The contour showed diastolic flattening and   systolic flattening. - Aortic valve: Severely calcified annulus. - Mitral valve: There was mild to moderate regurgitation. - Left atrium: The atrium was moderately dilated. - Right ventricle: The cavity size was mildly dilated. Systolic   function was mildly to moderately reduced. - Right atrium: The atrium was mildly dilated. - Tricuspid valve: There was severe regurgitation. - Pulmonary arteries: Systolic pressure was severely increased. PA   peak pressure: 75 mm Hg (S).  Patient Profile  75 yo female with PMH of chronic systolic and diastolic heart failure (LVEF 40-45%), chronic atrial fibrillation COPD, carotid stenosis s/p CEA, CAD s/p CABG, diabetes mellitus, CKD 4, GI bleed, cryptogenic cirrhosis, and severe pulmonary hypertension who presented with worsening dyspnea.      Assessment   1. Acute on chronic combined biventricular failure: Likely end stage 2. Pulmonary HTN: Looks like primarily pulmonary venous hypertension.  3. Severe COPD 4. AKI on CKD IV 5. Hx of hepatic encephalopathy and cryptogenic cirrhosis 6. Chronic atrial fibrillation.   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   CVP 10-11. Coox 58.5% off milrinone.    Restart torsemide today at 40 mg po BID.    Chronic A-fib. Continue amio 200 mg daily. Rate controlled currently.   Pt now DNR/DNI and plans to discharge to home hospice/residential hospice today.  Follow up with HF team prn only.   Shirley Friar, PA-C 06/26/2016, 7:50 AM   Advanced Heart Failure Team Pager [phone] (M-F; 7a - 4p)  Please contact [facility] for night-coverage after hours (4p -7a ) and weekends on [email or web address]  Patient seen and examined with Oda Kilts, PA-C. We discussed all aspects of the encounter. I agree with the assessment and plan as stated above.   She is about as good as we can get her from a HF perspective. Co-ox and CVP improved off inotropes. Agree with Hospice Care. I spoke with Palliative Care team at the bedside. We will sign off. Please  call with questions. Would continue current HF meds (including torsemide 40 bid) at discharge.   Frances Joynt,MD 2:40 PM

## 2016-06-26 NOTE — Progress Notes (Addendum)
Update: Patient's husband is agreeable to sending referral to [facility]. CSW sent referral for assessment.   [facility] is still unable to accept patient without symptom management needs. CSW spoke with patient's spouse regarding a different Hospice facility, but he stated he needed patient to be close to the family in [place]. Patient's spouse cannot handle patient at home.  CSW to continue to follow.  Percell Locus Gershom Brobeck LCSWA [phone]

## 2016-06-26 NOTE — Progress Notes (Signed)
PROGRESS NOTE    Cassandra Bonilla  [id number]:[id number] DOB: [birth year] DOA: 06/11/2016 PCP: Marco Collie, MD  Brief Narrative: 75 year old female past mental history of COPD, CAD and diabetes mellitus admitted on 9/26 with 5 days of progressively worsening shortness of breath and cough and found to have both COPD and CHF exacerbation. Patient was transferred to step down unit for placement of PICC line and starting of milrinone drip. Unfortunately, with her worsening heart failure, she has had little response to maximized therapy including amiodarone and Lasix continual infusions and milrinone. Currently transitioned to oral medication Palliative care was consulted for goals of care, family was changed to DO NOT RESUSCITATE. Plan is to discharge with hospice and patient was hoping for [facility] but they were unable to accept the patient without symptom management needs. Social Worker in the process of sending referral to [place].  Assessment & Plan:   Principal Problem:   Acute on chronic combined systolic and diastolic heart failure (HCC) Active Problems:   Chronic atrial fibrillation (HCC)   Type 2 diabetes mellitus with stage 4 chronic kidney disease, with long-term current use of insulin (HCC)   CKD (chronic kidney disease), stage IV (HCC)   Encephalopathy, hepatic (HCC)   Cryptogenic cirrhosis of liver (HCC)   COPD exacerbation (HCC)   Hyperkalemia   Respiratory distress   Gout   GERD (gastroesophageal reflux disease)   Essential hypertension   DNR (do not resuscitate) discussion   AKI (acute kidney injury) ([place])   DNR (do not resuscitate)  Acute on chronic combined systolic and diastolic heart failure ([place]):  - Status post right heart catheterization on 9/28. 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 - Despite aggressive measures, prognosis remains very poor. Palliative care has been consulted, discussed goals of care with the patient's husband, and plan to proceed with discharge to residential hospice when bed is available. Unfortunately was not accepted at [facility] so Social Work is sending referral to [place]. - Continue with PICC line as patient may need IV medications for Comfort at Hospice.   Chronic atrial fibrillation ([place]):  - Chads 2 score of 6. On Amiodarone 200 mg po daily and IV Metoprolol prn.  - No anticoagulation currently given recent GI bleed and cirrhosis.  Insulin Dependent Type 2 Diabetes Mellitus  - Blood Glucose was 174 on BMP this AM and CBG ranged from 95-189, -  A1c last month at 7.4. Continue with Lantus and insulin sliding scale.  CKD (chronic kidney disease), stage IV ([place]):  - EGFR was 12 - BUN/Cr went from 135/4.02 -> 134/3.93 respectively - IV Diuresis D/C'd and Torsemide 40 mg po BID started by Heart Failure Team.   Encephalopathy,  - Hepatic ([place]) with secondary Cryptogenic cirrhosis of liver ([place]):  -C/w  xifaxan and lactulose    COPD exacerbation ([place]):  - Continue on DuoNeb's q4hprn, s/p Levaquin.  - Continue with the steroid taper with 20 mg of Prednisone daily  Hyponatremia - Likely 2/2 to Diuresis - Continue to Monitor  DVT prophylaxis: SQ Heparin Code Status: DNR Family Communication: None at Bedside Disposition Plan: Hospice when Bed is Available.  Consultants:   Heart Failure Team  Palliative Care  Cardiology  Gastroenterology  Procedures: Right Heart Catheterization on 9/28  Antimicrobials: None Currently; Was on Levaquin 9/26-10/5   Subjective: Patient was seen  and examined at bedside and understood her prognosis. Awaiting placement in Hospice. Did not have any current complaints and no N/V/Abdominal Pain or Chest Pain.   Objective: Vitals:   06/26/16 0800 06/26/16 0900 06/26/16 1136 06/26/16 1525  BP: (!) 160/34  (!) 159/53 137/72  Pulse: 78 77 81 79  Resp: 12 14 16 17   Temp:   98.2 F (36.8 C) 97.7 F (36.5 C)  TempSrc:   Oral Oral  SpO2: 100% 100% 100% 100%  Weight:      Height:        Intake/Output Summary (Last 24 hours) at 06/26/16 1827 Last data filed at 06/26/16 1800  Gross per 24 hour  Intake              240 ml  Output             3050 ml  Net            -2810 ml   Filed Weights   06/24/16 0400 06/25/16 0400 06/26/16 0412  Weight: 58.6 kg (129 lb 1.6 oz) 54 kg (119 lb) 51.7 kg (114 lb)    Examination: Physical Exam:  Constitutional: Elderly appearing female in NAD.  Eyes:  Lids and conjunctivae normal, sclerae anicteric  ENMT: External Ears, Nose appear normal. Grossly normal hearing. Mucous membranes are moist.  Neck: Appears normal, supple, no cervical masses, normal ROM, no appreciable thyromegaly Respiratory: Diminished but no wheezing, rales, rhonchi or crackles. Normal respiratory effort and patient is not tachypenic. No accessory muscle use.  Cardiovascular: RRR. 2/6 Systolic murmur appreciated. No rubs / gallops. S1 and S2 auscultated. Mild edema. 2+ pedal pulses. No carotid bruits.  Abdomen: Soft, non-tender, non-distended. No masses palpated. No appreciable hepatosplenomegaly. Bowel sounds positive x4.  GU: Deferred. Musculoskeletal: No clubbing / cyanosis of digits/nails. No joint deformity upper and lower extremities.  Skin: No rashes, lesions, ulcers. No induration; Warm and dry. RUE PICC in place.  Neurologic: CN 2-12 grossly intact with no focal deficits. Sensation intact in all 4 Extremities. Romberg sign cerebellar reflexes not assessed.  Psychiatric: Normal judgment and insight. Alert and oriented x 3.  Normal mood and pleasant and appropriate affect.   Data Reviewed: I have personally reviewed following labs and imaging studies  CBC:  Recent Labs Lab 06/20/16 0615  WBC 6.1  NEUTROABS 5.6  HGB 8.8*  HCT 28.5*  MCV 80.3  PLT 623   Basic Metabolic Panel:  Recent Labs Lab 06/22/16 0910 06/23/16 0400 06/24/16 0430 06/25/16 0355 06/26/16 0435  NA 128* 127* 132* 133* 130*  K 3.3* 3.2* 4.0 3.1* 3.8  CL 89* 86* 88* 88* 86*  CO2 24 25 28 28 28   GLUCOSE 259* 220* 204* 176* 174*  BUN 117* 118* 127* 135* 134*  CREATININE 3.66* 3.67* 3.93* 4.02* 3.93*  CALCIUM 7.9* 8.0* 8.7* 8.9 8.8*  MG  --  1.5* 2.1  --   --    GFR: Estimated Creatinine Clearance: 9.8 mL/min (by C-G formula based on SCr of 3.93 mg/dL (H)). Liver Function Tests: No results for input(s): AST, ALT, ALKPHOS, BILITOT, PROT, ALBUMIN in the last 168 hours. No results for input(s): LIPASE, AMYLASE in the last 168 hours.  Recent Labs Lab 06/24/16 0500  AMMONIA 47*   Coagulation Profile: No results for input(s):  INR, PROTIME in the last 168 hours. Cardiac Enzymes: No results for input(s): CKTOTAL, CKMB, CKMBINDEX, TROPONINI in the last 168 hours. BNP (last 3 results) No results for input(s): PROBNP in the last 8760 hours. HbA1C: No results for input(s): HGBA1C in the last 72 hours. CBG:  Recent Labs Lab 06/25/16 0800 06/25/16 1122 06/25/16 1547 06/25/16 2124 06/26/16 0745  GLUCAP 209* 188* 95 189* 169*   Lipid Profile: No results for input(s): CHOL, HDL, LDLCALC, TRIG, CHOLHDL, LDLDIRECT in the last 72 hours. Thyroid Function Tests: No results for input(s): TSH, T4TOTAL, FREET4, T3FREE, THYROIDAB in the last 72 hours. Anemia Panel: No results for input(s): VITAMINB12, FOLATE, FERRITIN, TIBC, IRON, RETICCTPCT in the last 72 hours. Sepsis Labs: No results for input(s): PROCALCITON, LATICACIDVEN in the last 168 hours.  No results found for this or any previous visit (from the past 240 hour(s)).    Radiology Studies: No results found.  Scheduled Meds: . allopurinol  100 mg Oral Daily  . amiodarone  200 mg Oral Daily  . benzonatate  100 mg Oral TID  . cholestyramine light  4 g Oral Q12H  . feeding supplement (GLUCERNA SHAKE)  237 mL Oral TID BM  . heparin  5,000 Units Subcutaneous Q8H  . hydrALAZINE  12.5 mg Oral Q8H  . isosorbide mononitrate  30 mg Oral Daily  . lactulose  20 g Oral BID  . mouth rinse  15 mL Mouth Rinse BID  . pantoprazole  40 mg Oral Daily  . predniSONE  20 mg Oral Q breakfast  . rifaximin  400 mg Oral BID  . sodium chloride flush  10-40 mL Intracatheter Q12H  . sodium chloride flush  3 mL Intravenous Q12H  . torsemide  40 mg Oral BID   Continuous Infusions:    LOS: 15 days   Kerney Elbe, DO [facility] Pager [phone]  If 7PM-7AM, please contact night-coverage [email or web address] Password [facility] 06/26/2016, 6:27 PM

## 2016-06-27 ENCOUNTER — Ambulatory Visit: Payer: Medicare Other | Admitting: Cardiovascular Disease

## 2016-06-27 LAB — CBC WITH DIFFERENTIAL/PLATELET
Basophils Absolute: 0 10*3/uL (ref 0.0–0.1)
Basophils Relative: 0 %
EOS ABS: 0 10*3/uL (ref 0.0–0.7)
EOS PCT: 0 %
HCT: 24 % — ABNORMAL LOW (ref 36.0–46.0)
HEMOGLOBIN: 7.6 g/dL — AB (ref 12.0–15.0)
LYMPHS ABS: 1 10*3/uL (ref 0.7–4.0)
Lymphocytes Relative: 8 %
MCH: 25.1 pg — AB (ref 26.0–34.0)
MCHC: 31.7 g/dL (ref 30.0–36.0)
MCV: 79.2 fL (ref 78.0–100.0)
MONOS PCT: 5 %
Monocytes Absolute: 0.6 10*3/uL (ref 0.1–1.0)
NEUTROS PCT: 87 %
Neutro Abs: 10.6 10*3/uL — ABNORMAL HIGH (ref 1.7–7.7)
Platelets: 108 10*3/uL — ABNORMAL LOW (ref 150–400)
RBC: 3.03 MIL/uL — ABNORMAL LOW (ref 3.87–5.11)
RDW: 19.1 % — ABNORMAL HIGH (ref 11.5–15.5)
WBC: 12.3 10*3/uL — ABNORMAL HIGH (ref 4.0–10.5)

## 2016-06-27 LAB — COMPREHENSIVE METABOLIC PANEL
ALK PHOS: 139 U/L — AB (ref 38–126)
ALT: 32 U/L (ref 14–54)
ANION GAP: 15 (ref 5–15)
AST: 33 U/L (ref 15–41)
Albumin: 3 g/dL — ABNORMAL LOW (ref 3.5–5.0)
BUN: 141 mg/dL — ABNORMAL HIGH (ref 6–20)
CALCIUM: 8.6 mg/dL — AB (ref 8.9–10.3)
CO2: 28 mmol/L (ref 22–32)
Chloride: 88 mmol/L — ABNORMAL LOW (ref 101–111)
Creatinine, Ser: 3.95 mg/dL — ABNORMAL HIGH (ref 0.44–1.00)
GFR calc non Af Amer: 10 mL/min — ABNORMAL LOW (ref >60)
GFR, EST AFRICAN AMERICAN: 12 mL/min — AB (ref >60)
Glucose, Bld: 254 mg/dL — ABNORMAL HIGH (ref 65–99)
Potassium: 3.2 mmol/L — ABNORMAL LOW (ref 3.5–5.1)
SODIUM: 131 mmol/L — AB (ref 135–145)
TOTAL PROTEIN: 5.6 g/dL — AB (ref 6.5–8.1)
Total Bilirubin: 1.7 mg/dL — ABNORMAL HIGH (ref 0.3–1.2)

## 2016-06-27 LAB — MAGNESIUM: MAGNESIUM: 1.7 mg/dL (ref 1.7–2.4)

## 2016-06-27 MED ORDER — DIPHENHYDRAMINE HCL 25 MG PO CAPS: 25.0000 mg | ORAL_CAPSULE | ORAL | 0 refills | Status: AC | PRN

## 2016-06-27 MED ORDER — ISOSORBIDE MONONITRATE ER 30 MG PO TB24: 30.0000 mg | ORAL_TABLET | Freq: Every day | ORAL | 0 refills | Status: AC

## 2016-06-27 MED ORDER — HYDROCODONE-ACETAMINOPHEN 5-325 MG PO TABS: 1.0000 | ORAL_TABLET | ORAL | 0 refills | Status: AC | PRN

## 2016-06-27 MED ORDER — GUAIFENESIN-CODEINE 100-10 MG/5ML PO SOLN: 5.0000 mL | ORAL | 0 refills | Status: AC | PRN

## 2016-06-27 MED ORDER — FENTANYL CITRATE (PF) 100 MCG/2ML IJ SOLN: 25.0000 ug | INTRAMUSCULAR | 0 refills | Status: AC | PRN

## 2016-06-27 MED ORDER — GLYCOPYRROLATE 0.2 MG/ML IJ SOLN: 0.2000 mg | INTRAMUSCULAR | 0 refills | Status: AC | PRN

## 2016-06-27 MED ORDER — POTASSIUM CHLORIDE CRYS ER 20 MEQ PO TBCR
40.0000 meq | EXTENDED_RELEASE_TABLET | Freq: Two times a day (BID) | ORAL | Status: AC
  Administered 2016-06-27 (×2): 40 meq via ORAL
  Filled 2016-06-27 (×2): qty 2

## 2016-06-27 MED ORDER — ONDANSETRON HCL 4 MG PO TABS: 4.0000 mg | ORAL_TABLET | Freq: Four times a day (QID) | ORAL | 0 refills | Status: AC | PRN

## 2016-06-27 MED ORDER — TORSEMIDE 20 MG PO TABS: 40.0000 mg | ORAL_TABLET | Freq: Two times a day (BID) | ORAL | 0 refills | Status: AC

## 2016-06-27 MED ORDER — AMIODARONE HCL 200 MG PO TABS: 200.0000 mg | ORAL_TABLET | Freq: Every day | ORAL | 0 refills | Status: AC

## 2016-06-27 MED ORDER — POTASSIUM CHLORIDE 20 MEQ PO PACK: 40.0000 meq | PACK | Freq: Once | ORAL | Status: DC

## 2016-06-27 MED ORDER — IPRATROPIUM-ALBUTEROL 0.5-2.5 (3) MG/3ML IN SOLN: 3.0000 mL | RESPIRATORY_TRACT | 0 refills | Status: AC | PRN

## 2016-06-27 MED ORDER — CAMPHOR-MENTHOL 0.5-0.5 % EX LOTN
TOPICAL_LOTION | CUTANEOUS | 0 refills | Status: AC | PRN
Start: 2016-06-27 — End: ?

## 2016-06-27 MED ORDER — GLUCERNA SHAKE PO LIQD: 237.0000 mL | Freq: Three times a day (TID) | ORAL | 0 refills | Status: AC

## 2016-06-27 MED ORDER — BENZONATATE 100 MG PO CAPS: 100.0000 mg | ORAL_CAPSULE | Freq: Three times a day (TID) | ORAL | 0 refills | Status: AC

## 2016-06-27 MED ORDER — HYDRALAZINE HCL 25 MG PO TABS: 12.5000 mg | ORAL_TABLET | Freq: Three times a day (TID) | ORAL | 0 refills | Status: AC

## 2016-06-27 NOTE — Care Management Note (Signed)
Case Management Note  Patient Details  Name: Cassandra Bonilla MRN: [id number] Date of Birth: 1941-04-09  Subjective/Objective:   CHF end-statge, COPD                 Action/Plan: Discharge Planning: AVS reviewed: Chart reviewed. Scheduled dc to [place]. CSW following for SNF placement.   Expected Discharge Date:  06/27/2016               Expected Discharge Plan:  [place]  In-House Referral:  Hospice / [place], Clinical Social Work  Discharge planning Services  CM Consult  Post Acute Care Choice:  NA Choice offered to:  NA  DME Arranged:  N/A DME Agency:  NA  HH Arranged:  NA HH Agency:  NA  Status of Service:  Completed, signed off  If discussed at [place] of Stay Meetings, dates discussed:  06/27/2016  Additional Comments:  Erenest Rasher, RN 06/27/2016, 2:41 PM

## 2016-06-27 NOTE — Progress Notes (Signed)
Patient has been discharged to hospice, [place] by ambulance. AVS was given with instructions with the family member. Patient has no questions.

## 2016-06-27 NOTE — Progress Notes (Signed)
Patient will DC to: [facility] Anticipated DC date: 06/27/16 Family notified: Husband at bedside Transport by: Corey Harold   Per MD patient ready for DC to Hospice. RN, patient, patient's family, and facility notified of DC. Discharge Summary sent to facility. RN given number for report. DC packet on chart. Ambulance transport requested for patient.   CSW signing off.  Cedric Fishman, [place] Social Worker [phone]

## 2016-06-27 NOTE — Discharge Summary (Signed)
Physician Discharge Summary  Cassandra Bonilla [id number] DOB: August 13, 1941 DOA: 06/11/2016  PCP: Marco Collie, MD  Admit date: 06/11/2016 Discharge date: 06/27/2016  Admitted From: Home/GI Office Disposition:  Residential Hospice at [facility]  Recommendations for Outpatient Follow-up:  1. Follow up with PCP in 1-2 weeks 2. Please obtain BMP/CBC in one week  Home Health: No Equipment/Devices: PICC LINE in PLACE  Discharge Condition: HOSPICE CODE STATUS: DNR Diet recommendation: Heart Healthy/Carb Modified with Supplementation  Brief/Interim Summary: 75 year old female past mental history of COPD, CAD and diabetes mellitus admitted on 9/26 with 5 days of progressively worsening shortness of breath and cough and found to have both COPD and CHF exacerbation. Patient was transferred to step down unit for placement of PICC line and starting of milrinone drip. Unfortunately, with her worsening heart failure, she has had little response to maximized therapy including amiodarone and Lasix continual infusions and milrinone.Currently transitioned to oral medication.Palliative care was consultedfor goals of care, family was changed to DO NOT RESUSCITATE. Plan is to discharge to Residential Hospice at [facility]   Discharge Diagnoses:  Principal Problem:   Acute on chronic combined systolic and diastolic heart failure (HCC) Active Problems:   Chronic atrial fibrillation (HCC)   Type 2 diabetes mellitus with stage 4 chronic kidney disease, with long-term current use of insulin (HCC)   CKD (chronic kidney disease), stage IV (HCC)   Encephalopathy, hepatic (HCC)   Cryptogenic cirrhosis of liver (HCC)   COPD exacerbation (HCC)   Hyperkalemia   Respiratory distress   Gout   GERD (gastroesophageal reflux disease)   Essential hypertension   DNR (do not resuscitate) discussion   AKI (acute kidney injury) ([place])   DNR (do not resuscitate)  Acute on chronic combined systolic and diastolic heart  failure ([place]): - Status post right heart catheterization on 9/28. 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 - Despite aggressive measures, prognosis remains very poor. Palliative care has been consulted, discussed goals of care with the patient's husband, and plan to proceed with discharge to residential hospice when bed is available. Unfortunately was not accepted at [facility] so Social Work is sending referral to [place]. - Continue with PICC line as patient may need IV medications for Comfort at Hospice.   Chronic atrial fibrillation ([place]):  - Chads 2 score of 6. On Amiodarone 200 mg po daily and IV Metoprolol prn.  - No anticoagulation currently given recent GI bleed and cirrhosis.  Insulin Dependent Type 2 Diabetes Mellitus  - Blood Glucose was 174 on BMP this AM and CBG ranged from 95-189, - A1c last month at 7.4. Continue with Lantus and insulin sliding scale with Novolog.  CKD (chronic kidney disease), stage IV ([place]):  - EGFR was 12 - BUN/Cr went from 135/4.02 -> 134/3.93 -> 141/3.95 respectively - IV Diuresis D/C'd and Torsemide 40 mg po BID started by Heart Failure Team. Continue Torsemide.   Encephalopathy,  - Hepatic ([place]) with secondary Cryptogenic cirrhosis of liver ([place]):  -C/w xifaxan and lactulose   COPD exacerbation ([place]):  - Continue on DuoNeb's q4hprn, s/p Levaquin. - Steroid Taper D/C'd  Hyponatremia, stable - Likely 2/2 to Diuresis - Continue to Monitor  and repeat BMP  In 1 week.   Hypokalemia - Repleted Prior to D/C - Check CMP in 1 week.    Leukoyctosis - S/p Treatment with Levaquin - No active Signs of Infection - Likely Reactive to Steroid Usage - Check CBC in 1 week.   Discharge Instructions  Discharge Instructions    Call MD for:  persistant dizziness or light-headedness    Complete by:  As directed    Call MD for:  persistant nausea and vomiting    Complete by:  As directed    Call MD for:  severe uncontrolled pain    Complete by:  As directed    Diet - low sodium heart healthy    Complete by:  As directed    Discharge instructions    Complete by:  As directed    Follow up care at Hospice   Increase activity slowly    Complete by:  As directed        Medication List    STOP taking these medications   metoprolol succinate 25 MG 24 hr tablet Commonly known as:  TOPROL-XL   ROBITUSSIN DM 10-100 MG/5ML liquid Generic drug:  Dextromethorphan-Guaifenesin     TAKE these medications   acetaminophen 500 MG tablet Commonly known as:  TYLENOL Take 1 tablet (500 mg total) by mouth every 6 (six) hours as needed for mild pain (or Fever >/= 101).   allopurinol 100 MG tablet Commonly known as:  ZYLOPRIM Take 100 mg by mouth daily.   amiodarone 200 MG tablet Commonly known as:  PACERONE Take 1 tablet (200 mg total) by mouth daily. Start taking on:  06/28/2016   atorvastatin 40 MG tablet Commonly known as:  LIPITOR Take 40 mg by mouth daily.   benzonatate 100 MG capsule Commonly known as:  TESSALON Take 1 capsule (100 mg total) by mouth 3 (three) times daily.   camphor-menthol lotion Commonly known as:  SARNA Apply topically as needed for itching.   cholestyramine light 4 g packet Commonly known as:  PREVALITE Take 1 packet (4 g total) by mouth every 12 (twelve) hours.   diphenhydrAMINE 25 mg capsule Commonly known as:  BENADRYL Take 1 capsule (25 mg total) by mouth every 4 (four) hours as needed for itching.   feeding supplement (GLUCERNA SHAKE) Liqd Take 237 mLs by mouth 3 (three) times  daily between meals.   fentaNYL 100 MCG/2ML injection Commonly known as:  SUBLIMAZE Inject 0.5 mLs (25 mcg total) into the vein every hour as needed for severe pain (DYSPNEA).   glycopyrrolate 0.2 MG/ML injection Commonly known as:  ROBINUL Inject 1 mL (0.2 mg total) into the vein every 4 (four) hours as needed (terminal secretions).   guaiFENesin-codeine 100-10 MG/5ML syrup Take 5 mLs by mouth every 4 (four) hours as needed for cough.   hydrALAZINE 25 MG tablet Commonly known as:  APRESOLINE Take 0.5 tablets (12.5 mg total) by mouth every 8 (eight) hours.   HYDROcodone-acetaminophen 5-325 MG tablet Commonly known as:  NORCO/VICODIN Take 1-2 tablets by mouth every 4 (four) hours as needed for severe pain.   hydrOXYzine 25 MG tablet Commonly known as:  ATARAX/VISTARIL Take 1-2 tablets (25-50 mg total) by mouth every 6 (six) hours as needed for itching. What changed:  when to take this  additional instructions   insulin glargine 100 UNIT/ML injection Commonly known as:  LANTUS Inject 0.05 mLs (5 Units total) into the skin every morning.   insulin lispro 100 UNIT/ML injection Commonly  known as:  HUMALOG Inject 2-15 Units into the skin 3 (three) times daily after meals. 2-8 units if CBG <400, takes 15 units if CBG >400 (per sliding scale)   ipratropium-albuterol 0.5-2.5 (3) MG/3ML Soln Commonly known as:  DUONEB Take 3 mLs by nebulization every 4 (four) hours as needed.   isosorbide mononitrate 30 MG 24 hr tablet Commonly known as:  IMDUR Take 1 tablet (30 mg total) by mouth daily. Start taking on:  06/28/2016   lactulose 10 GM/15ML solution Commonly known as:  CHRONULAC Take 45 mLs (30 g total) by mouth 3 (three) times daily.   ondansetron 4 MG tablet Commonly known as:  ZOFRAN Take 1 tablet (4 mg total) by mouth every 6 (six) hours as needed for nausea.   pantoprazole 40 MG tablet Commonly known as:  PROTONIX Take 40 mg by mouth daily.   potassium chloride SA  20 MEQ tablet Commonly known as:  K-DUR,KLOR-CON Take 2 tablets (40 mEq total) by mouth daily.   rifaximin 200 MG tablet Commonly known as:  XIFAXAN Take 2 tablets (400 mg total) by mouth 3 (three) times daily. What changed:  when to take this   torsemide 20 MG tablet Commonly known as:  DEMADEX Take 2 tablets (40 mg total) by mouth 2 (two) times daily. What changed:  how much to take  how to take this  when to take this  additional instructions       Allergies  Allergen Reactions  . Contrast Media [Iodinated Diagnostic Agents] Hives  . Prednisone Other (See Comments)    Confusion  . Lactose Intolerance (Gi) Diarrhea    There are times when/if the patient consumes milk products, it results in diarrhea    Consultations: - Heart Failure Team - Palliative Care - Cardiology - Gastroenterology   Procedures/Studies: Dg Chest 2 View  Result Date: 06/11/2016 CLINICAL DATA:  Patient with wheezing and shortness of breath for 1 week. EXAM: CHEST  2 VIEW COMPARISON:  Chest radiograph 05/25/2016. FINDINGS: Stable cardiomegaly status post median sternotomy. Pulmonary vascular redistribution and bilateral perihilar interstitial opacities. Small bilateral pleural effusions. Thoracic spine degenerative changes. IMPRESSION: Cardiomegaly and mild interstitial pulmonary edema. Electronically Signed   By: Lovey Newcomer M.D.   On: 06/11/2016 11:25   Dg Chest Port 1 View  Result Date: 06/13/2016 CLINICAL DATA:  Central catheter placement EXAM: PORTABLE CHEST 1 VIEW COMPARISON:  June 11, 2016 FINDINGS: Central catheter tip is in the superior vena cava. No pneumothorax. There is mild atelectasis in the left base with minimal left pleural effusion. Lungs elsewhere are clear. Heart is enlarged with pulmonary vascularity within normal limits. There is atherosclerotic calcification in the aorta. Patient is status post median sternotomy. There are surgical clips in the left cervical -thoracic  junction. No evident adenopathy. No bone lesions. IMPRESSION: Central catheter tip in superior vena cava. No pneumothorax. Mild left base atelectasis with small left pleural effusion. Lungs elsewhere clear. Stable cardiomegaly. Aortic atherosclerosis. Electronically Signed   By: Lowella Grip III M.D.   On: 06/13/2016 15:51    Right Heart Catheterization on 9/28 Findings:  RA = 30 RV = 68/26 PA = 69/25 (43) PCW = 33 Fick cardiac output/index = 3.7/2.1 Thermo CO/CI = 4.0/2.3 PVR = 2.5 WU Ao sat = 99% PA sat = 45%, 46%  Assessment:  Severely elevated biventricular pressures with moderate pulmonary venous HTN and evidence of right heart failure. Probable restrictive physiology. Cardiac output at least moderately depressed.    Subjective: Patient  was seen and examined at bedside today and she was doing well. Had no active complaints and stated she felt well. Was ready to go to Hospice. No other complaints or concerns at this time.   Discharge Exam: Vitals:   06/27/16 0800 06/27/16 1257  BP: (!) 113/39 108/70  Pulse: 77 83  Resp: 17 19  Temp: 98.3 F (36.8 C) 99 F (37.2 C)   Vitals:   06/27/16 0400 06/27/16 0604 06/27/16 0800 06/27/16 1257  BP: (!) 139/46 (!) 139/46 (!) 113/39 108/70  Pulse: 77 75 77 83  Resp: 15 (!) _0 Temp:  99.6 F (37.6 C) 98.3 F (36.8 C) 99 F (37.2 C)  TempSrc:  Oral Oral Axillary  SpO2: 96% 95% 100% 100%  Weight:      Height:        General: Pt is alert, awake, not in acute distress Cardiovascular: RRR, S1/S2 +, no rubs, no gallops Respiratory: Diminished with mild expiratory wheezing, no rhonchi Abdominal: Soft, NT, ND, bowel sounds + Extremities: no edema, no cyanosis. Legs shriveled and have chronic changes of being edematous.   The results of significant diagnostics from this hospitalization (including imaging, microbiology, ancillary and laboratory) are listed below for reference.    Microbiology: No results found for  this or any previous visit (from the past 240 hour(s)).   Labs: BNP (last 3 results)  Recent Labs  04/25/16 0741 06/10/16 1305 06/11/16 1148  BNP 3,143.2* >4200.0* 7,628.3*   Basic Metabolic Panel:  Recent Labs Lab 06/23/16 0400 06/24/16 0430 06/25/16 0355 06/26/16 0435 06/27/16 0345  NA 127* 132* 133* 130* 131*  K 3.2* 4.0 3.1* 3.8 3.2*  CL 86* 88* 88* 86* 88*  CO2 _1 GLUCOSE 220* 204* 176* 174* 254*  BUN 118* 127* 135* 134* 141*  CREATININE 3.67* 3.93* 4.02* 3.93* 3.95*  CALCIUM 8.0* 8.7* 8.9 8.8* 8.6*  MG 1.5* 2.1  --   --  1.7   Liver Function Tests:  Recent Labs Lab 06/27/16 0345  AST 33  ALT 32  ALKPHOS 139*  BILITOT 1.7*  PROT 5.6*  ALBUMIN 3.0*   No results for input(s): LIPASE, AMYLASE in the last 168 hours.  Recent Labs Lab 06/24/16 0500  AMMONIA 47*   CBC:  Recent Labs Lab 06/27/16 0345  WBC 12.3*  NEUTROABS 10.6*  HGB 7.6*  HCT 24.0*  MCV 79.2  PLT 108*   Cardiac Enzymes: No results for input(s): CKTOTAL, CKMB, CKMBINDEX, TROPONINI in the last 168 hours. BNP: Invalid input(s): POCBNP CBG:  Recent Labs Lab 06/25/16 0800 06/25/16 1122 06/25/16 1547 06/25/16 2124 06/26/16 0745  GLUCAP 209* 188* 95 189* 169*   D-Dimer No results for input(s): DDIMER in the last 72 hours. Hgb A1c No results for input(s): HGBA1C in the last 72 hours. Lipid Profile No results for input(s): CHOL, HDL, LDLCALC, TRIG, CHOLHDL, LDLDIRECT in the last 72 hours. Thyroid function studies No results for input(s): TSH, T4TOTAL, T3FREE, THYROIDAB in the last 72 hours.  Invalid input(s): FREET3 Anemia work up No results for input(s): VITAMINB12, FOLATE, FERRITIN, TIBC, IRON, RETICCTPCT in the last 72 hours. Urinalysis    Component Value Date/Time   COLORURINE YELLOW 05/25/2016 [place] 05/25/2016 1725   LABSPEC 1.014 05/25/2016 1725   PHURINE 6.0 05/25/2016 1725   GLUCOSEU NEGATIVE 05/25/2016 1725   HGBUR NEGATIVE  05/25/2016 1725   BILIRUBINUR NEGATIVE 05/25/2016 [place] 05/25/2016 [place]  NEGATIVE 05/25/2016 1725   NITRITE NEGATIVE 05/25/2016 1725   LEUKOCYTESUR NEGATIVE 05/25/2016 1725   Sepsis Labs Invalid input(s): PROCALCITONIN,  WBC,  LACTICIDVEN Microbiology No results found for this or any previous visit (from the past 240 hour(s)).  Time coordinating discharge: Over 30 minutes  SIGNED:  Kerney Elbe, DO [facility] 06/27/2016, 1:07 PM Pager [phone]  If 7PM-7AM, please contact night-coverage [email or web address] Password [email or web address]

## 2016-07-17 DEATH — deceased

## 2017-03-01 IMAGING — US US RENAL
1 series · 14 of 25 positions shown · non-contrast
Comparison: April 22, 2016 complete abdomen ultrasound

CLINICAL DATA: Stage 4 chronic kidney disease.

EXAM:
RENAL / URINARY TRACT ULTRASOUND COMPLETE

[Series 1: us renal · 0.26mm/px · 14 of 26 slices shown]
[im 1/26]
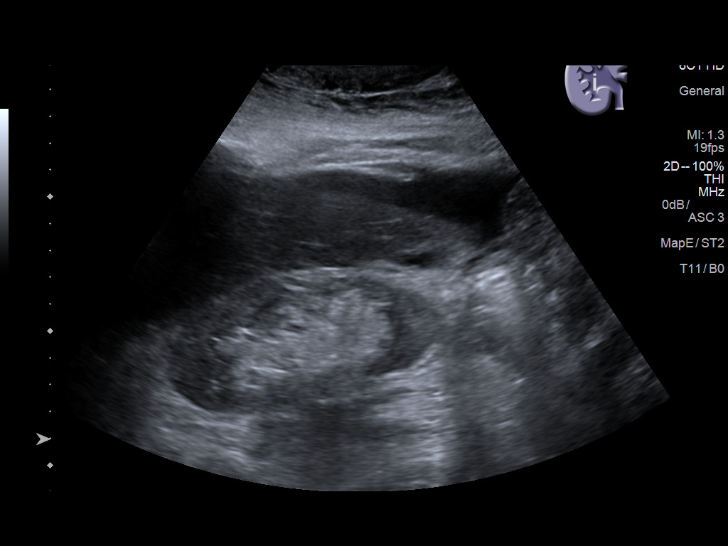
[im 3/26]
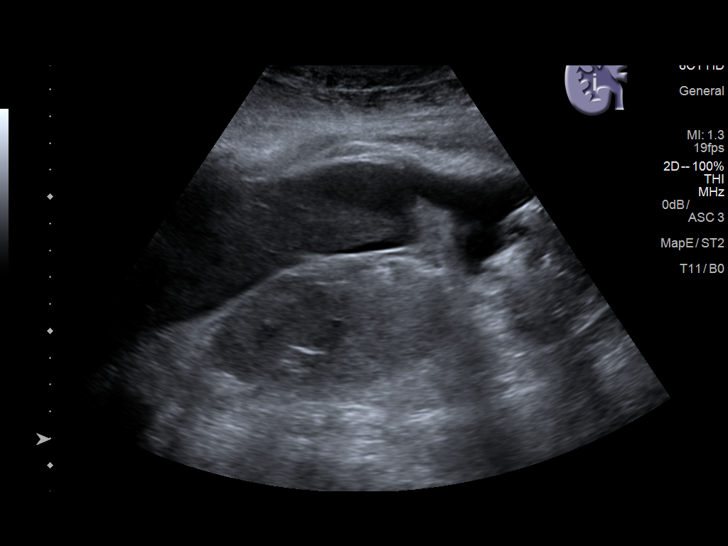
[im 5/26]
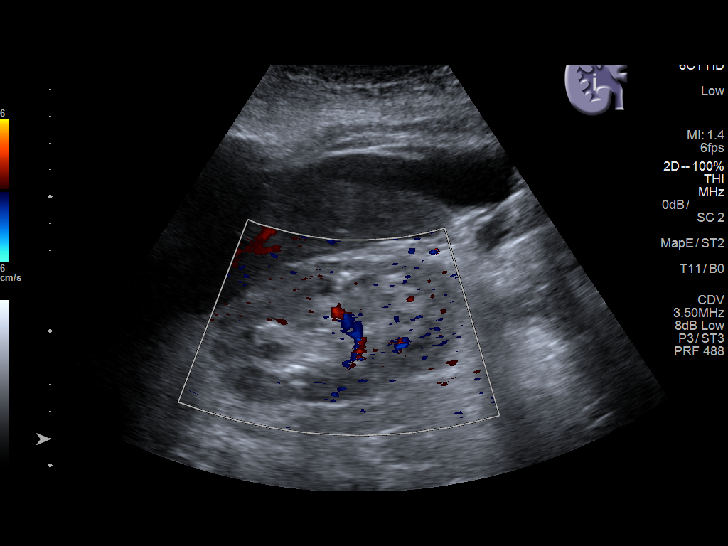
[im 7/26]
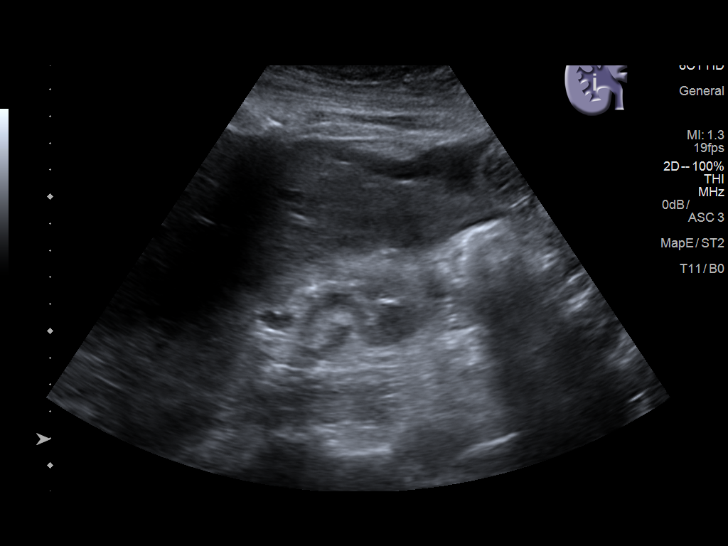
[im 9/26]
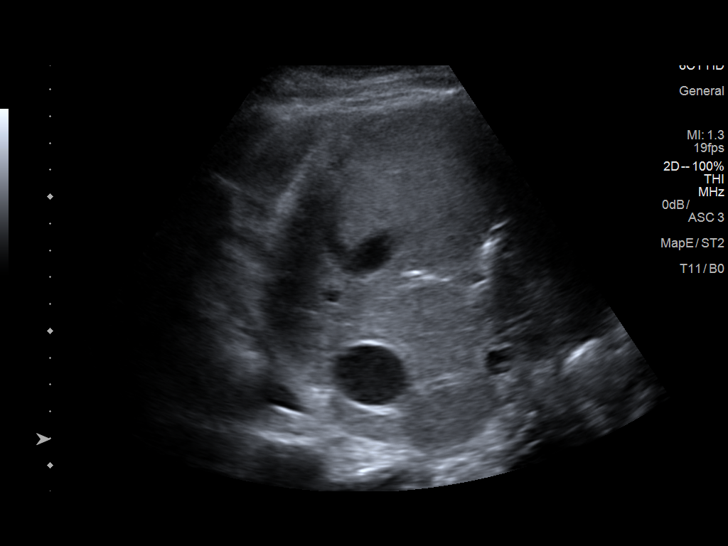
[im 10/26]
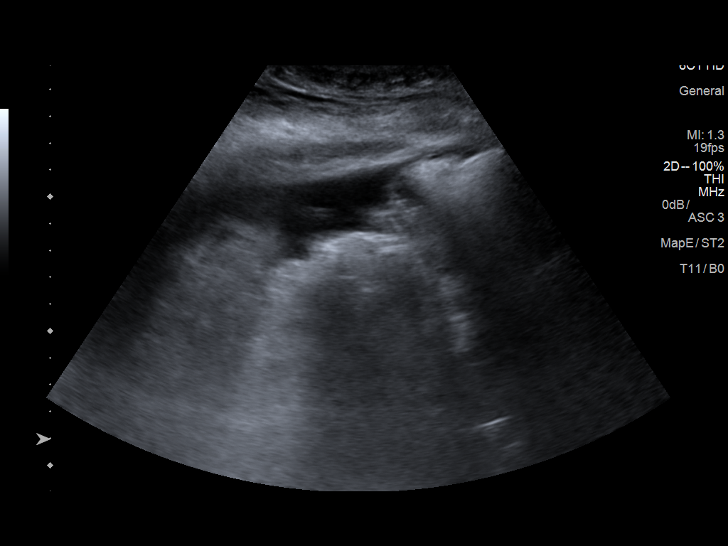
[im 12/26]
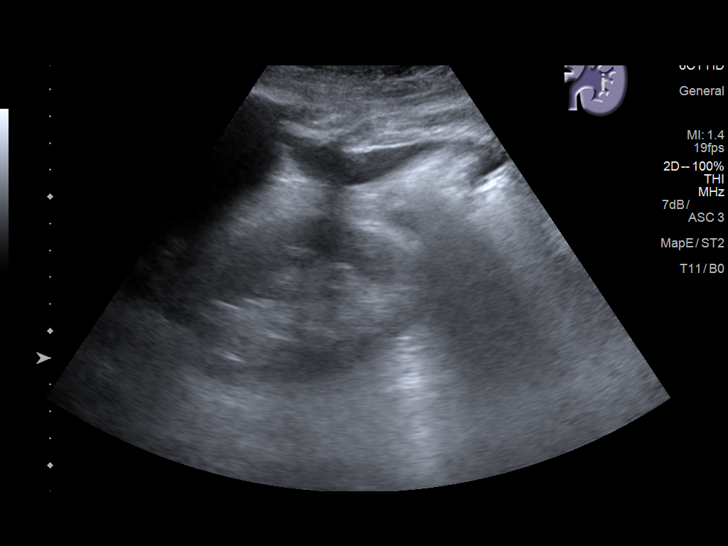
[im 14/26]
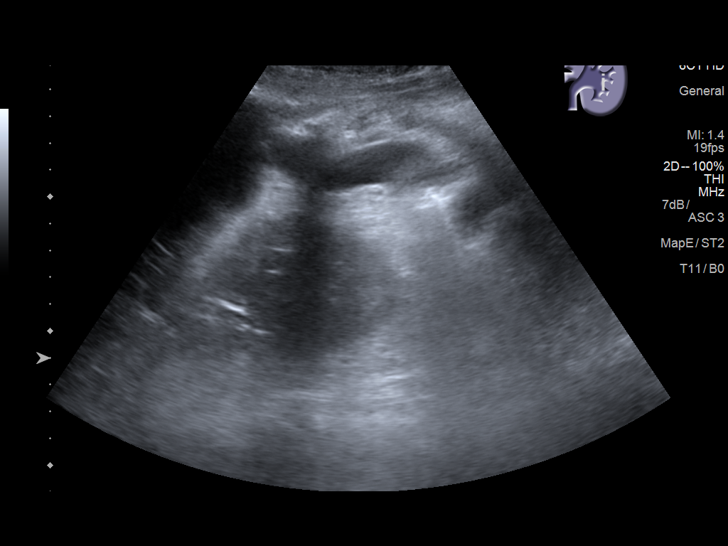
[im 16/26]
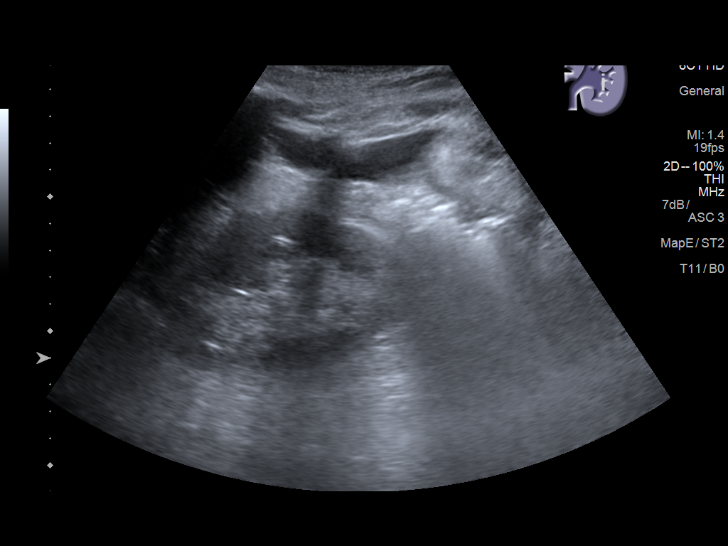
[im 17/26]
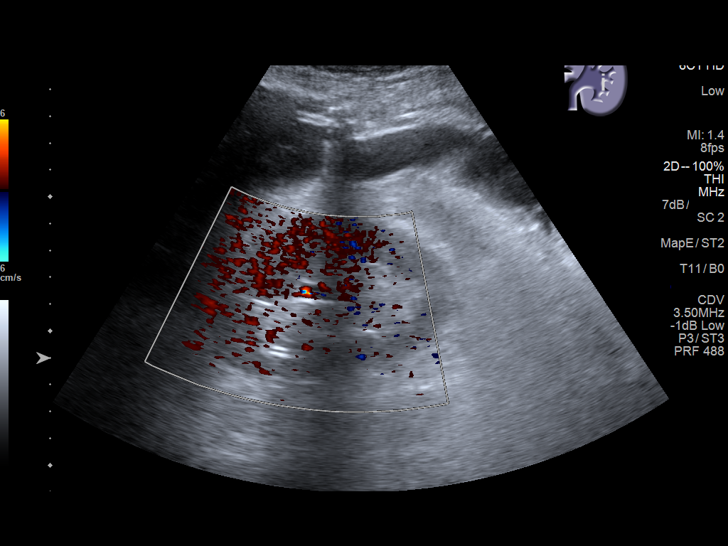
[im 19/26]
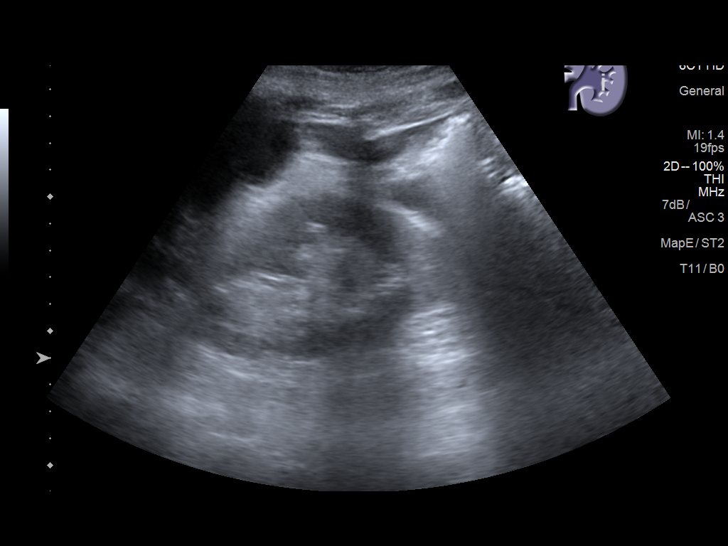
[im 21/26]
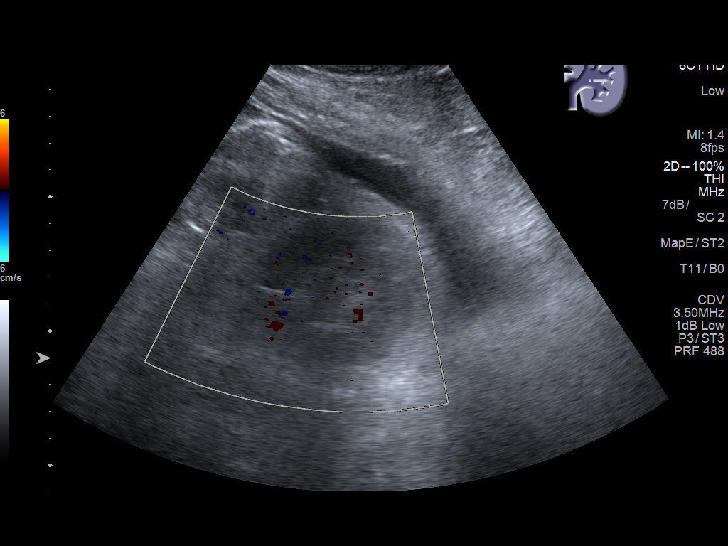
[im 23/26]
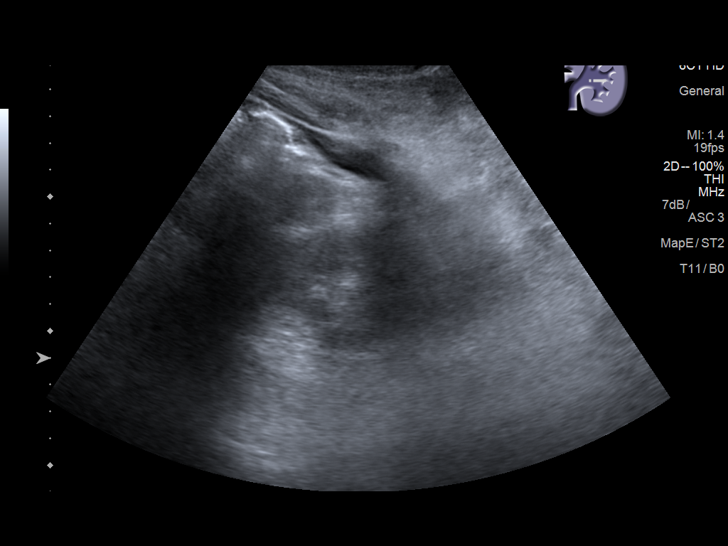
[im 26/26]
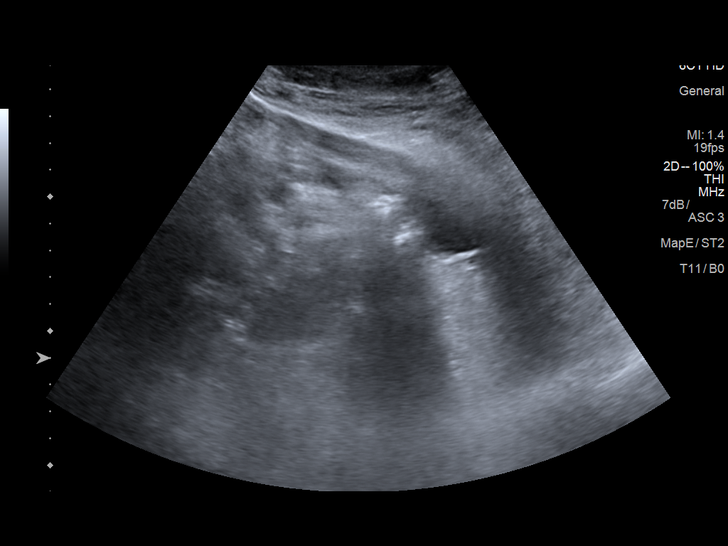

[14 of 25 positions shown; findings below may reference images not displayed]

FINDINGS: Right Kidney:

Length: 10.5 cm. There is diffuse increased echotexture of the right
kidney. No mass or hydronephrosis visualized.

Left Kidney:

Length: 10.6 cm. There is diffuse increased echotexture of the left
kidney. No mass or hydronephrosis visualized.

Bladder:

Not well seen with Foley catheter in place.

There is incidental finding of right pleural effusion and ascites.
IMPRESSION: No hydronephrosis identified bilaterally.

Bilateral diffuse increased echotexture of the kidneys consistent
with medical renal disease.

Right pleural effusion and ascites.

## 2018-04-23 IMAGING — CR DG CHEST 2V
2 series · 2 of 2 positions shown · non-contrast
Comparison: 04/21/2016

CLINICAL DATA: Pt states she is here for pneumonia, has a very
productive cough, , hx htn, chf, copd, , diabetes

EXAM:
CHEST  2 VIEW

[chest lat]
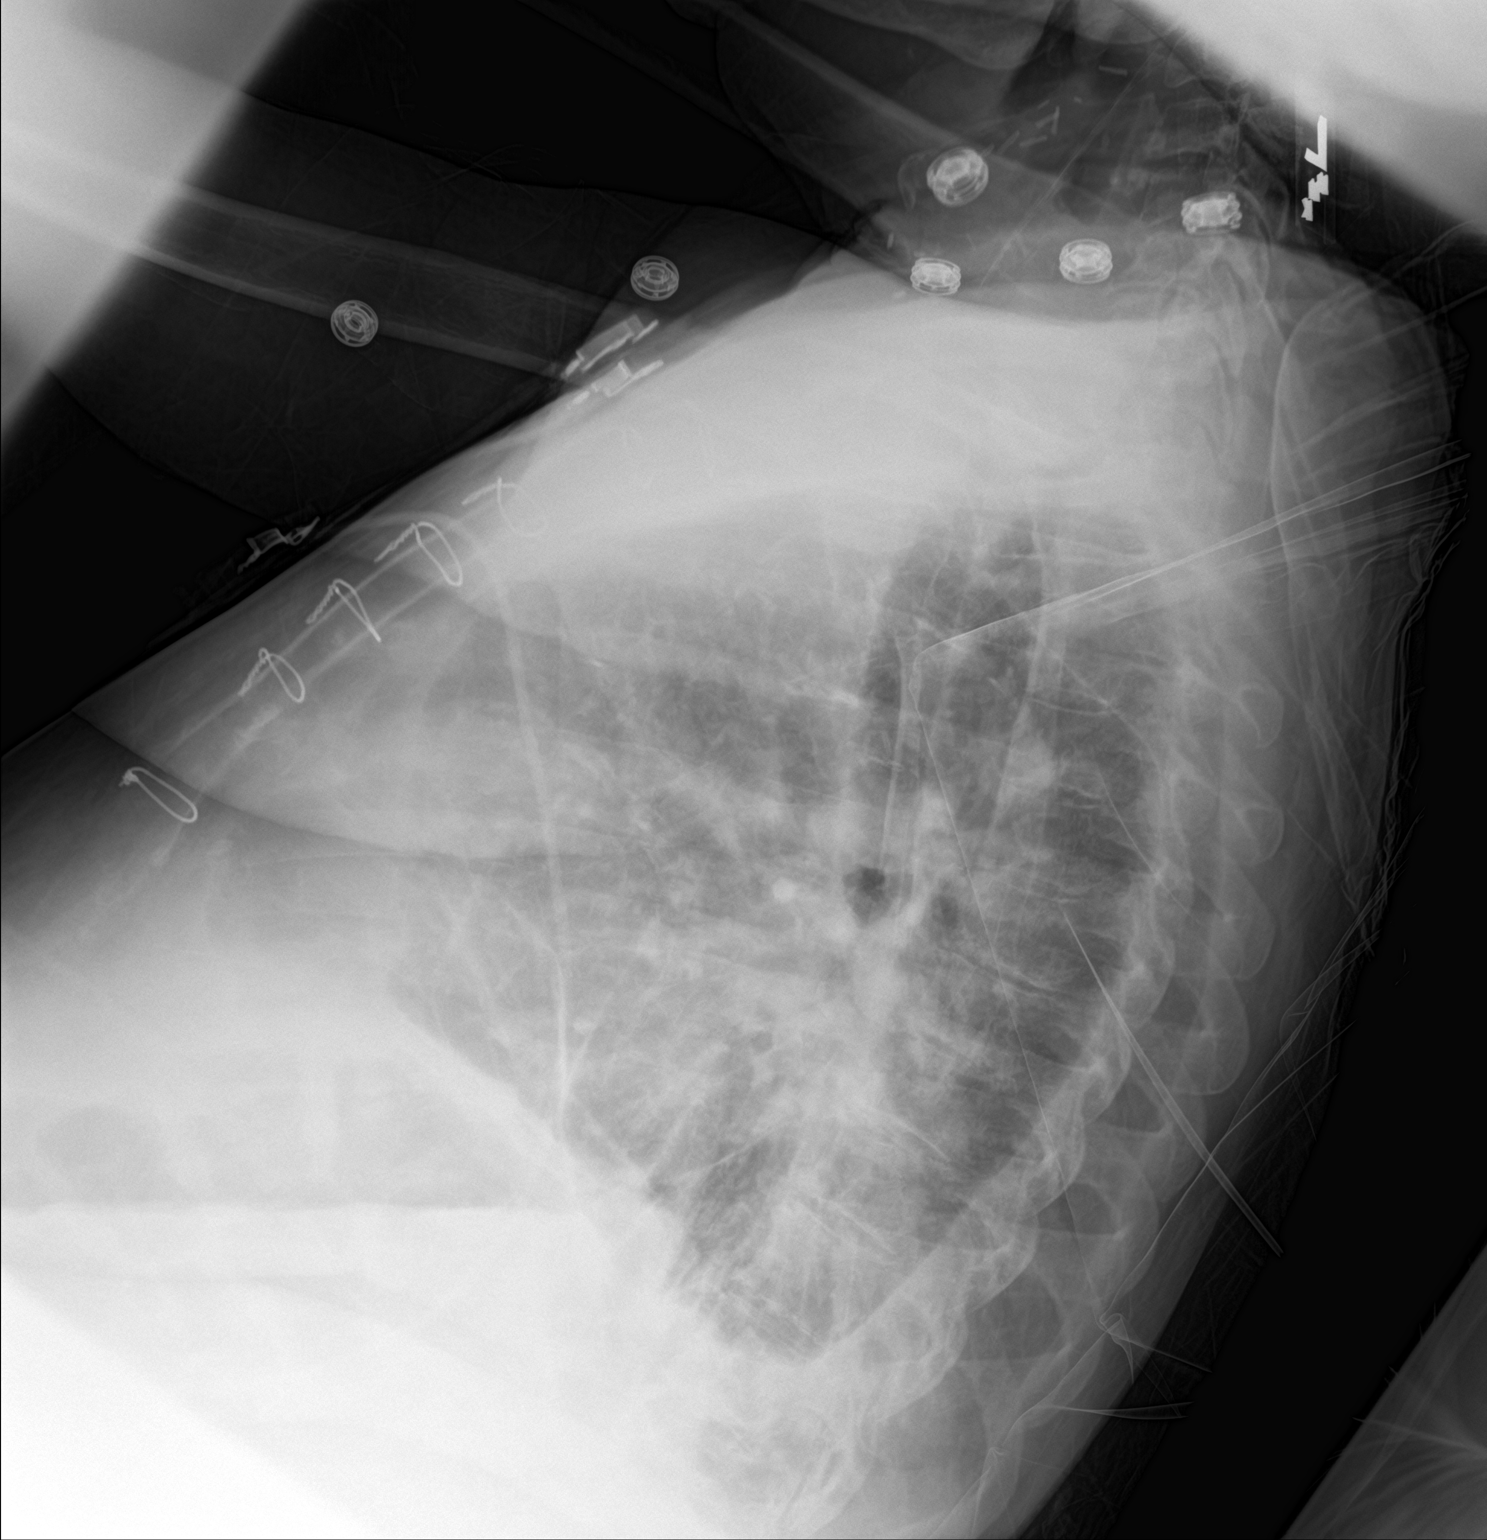

[chest ap]
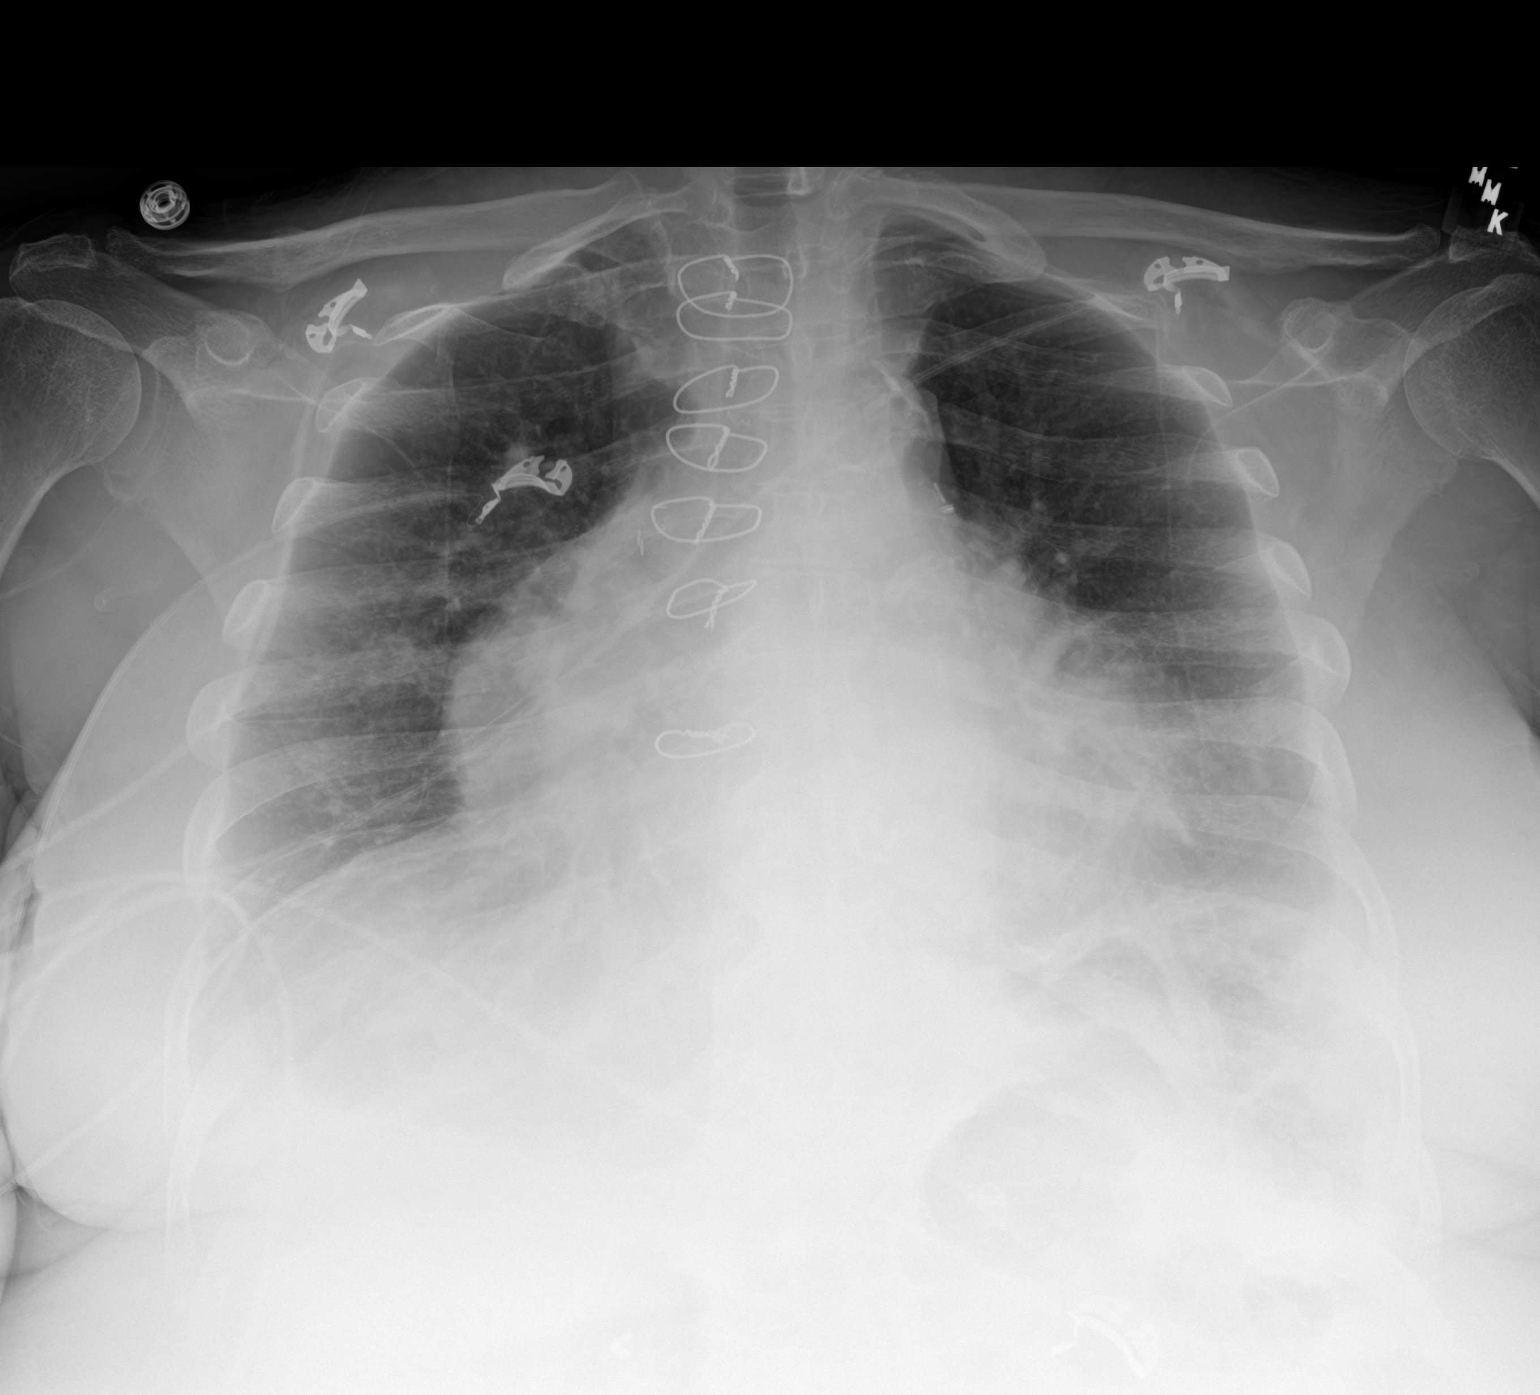

[2 of 2 positions shown; findings below may reference images not displayed]

FINDINGS: Changes from previous cardiac surgery stable. Cardiac silhouette is
mildly enlarged. No mediastinal or hilar masses or evidence of
adenopathy.

There is streaky opacity at the lung bases. Lungs otherwise clear.
There is a small left pleural effusion. No convincing pulmonary
edema. No pneumothorax.

Bony thorax is demineralized but grossly intact.
IMPRESSION: 1. Streaky lung base opacity best seen on the lateral view, most
likely due to atelectasis. Pneumonia is not excluded but felt less
likely.
2. Cardiomegaly and small left pleural effusion, but no evidence of
pulmonary edema.

## 2018-04-25 IMAGING — DX DG HIP (WITH OR WITHOUT PELVIS) 2-3V*L*
3 series · 3 of 3 positions shown · non-contrast
Comparison: None

CLINICAL DATA: Hip pain

EXAM:
DG HIP (WITH OR WITHOUT PELVIS) 2-3V LEFT

[hip ap]
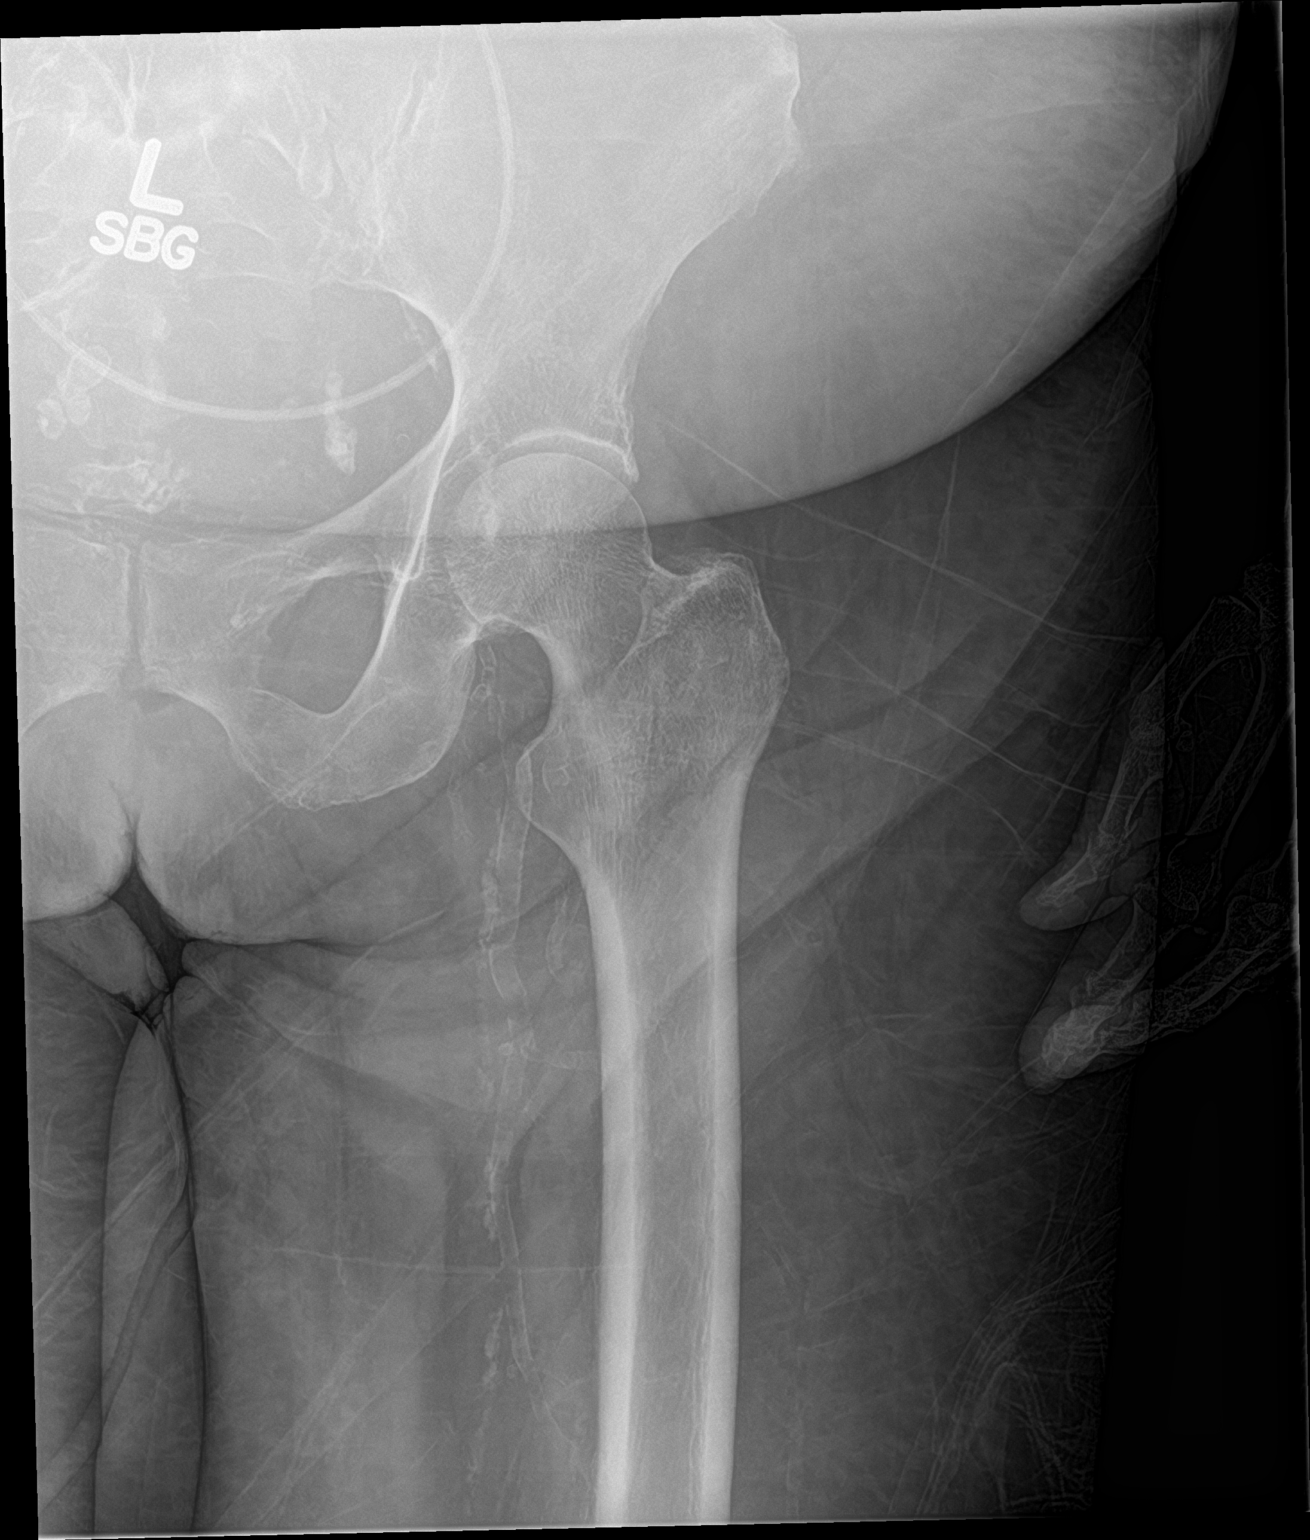

[hip lat]
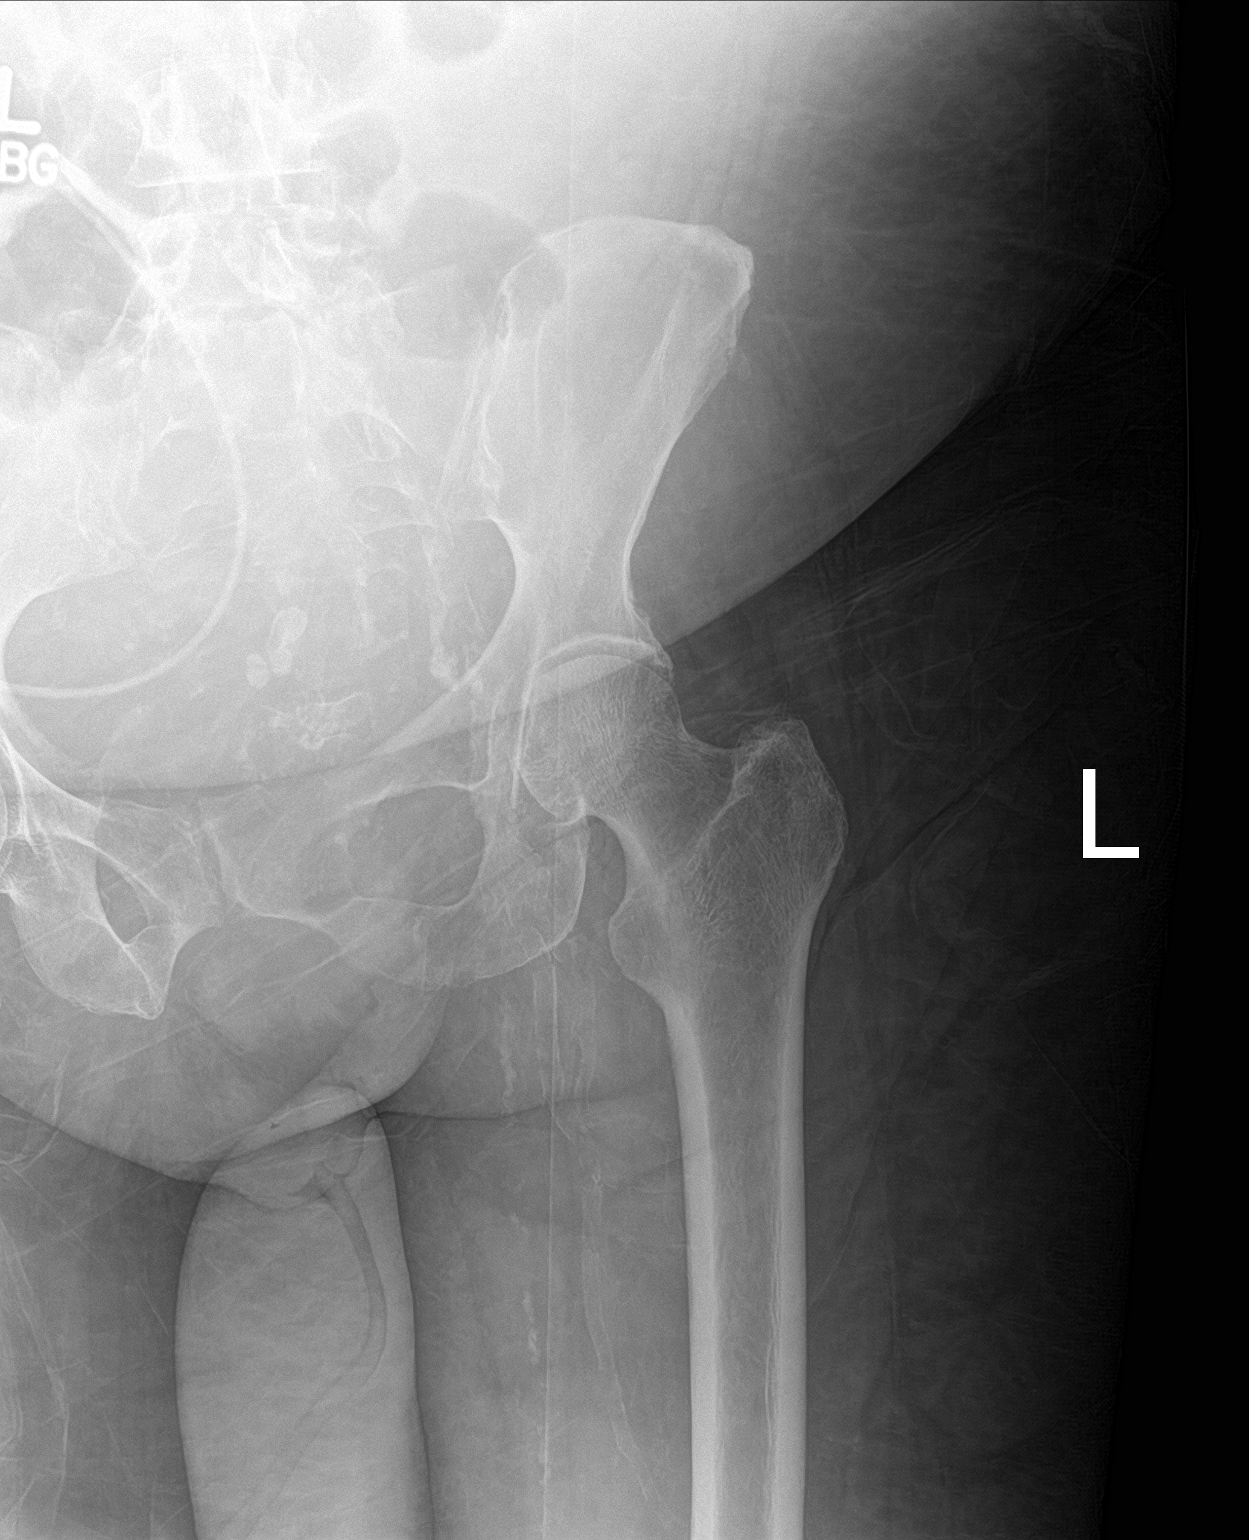

[pelvis ap]
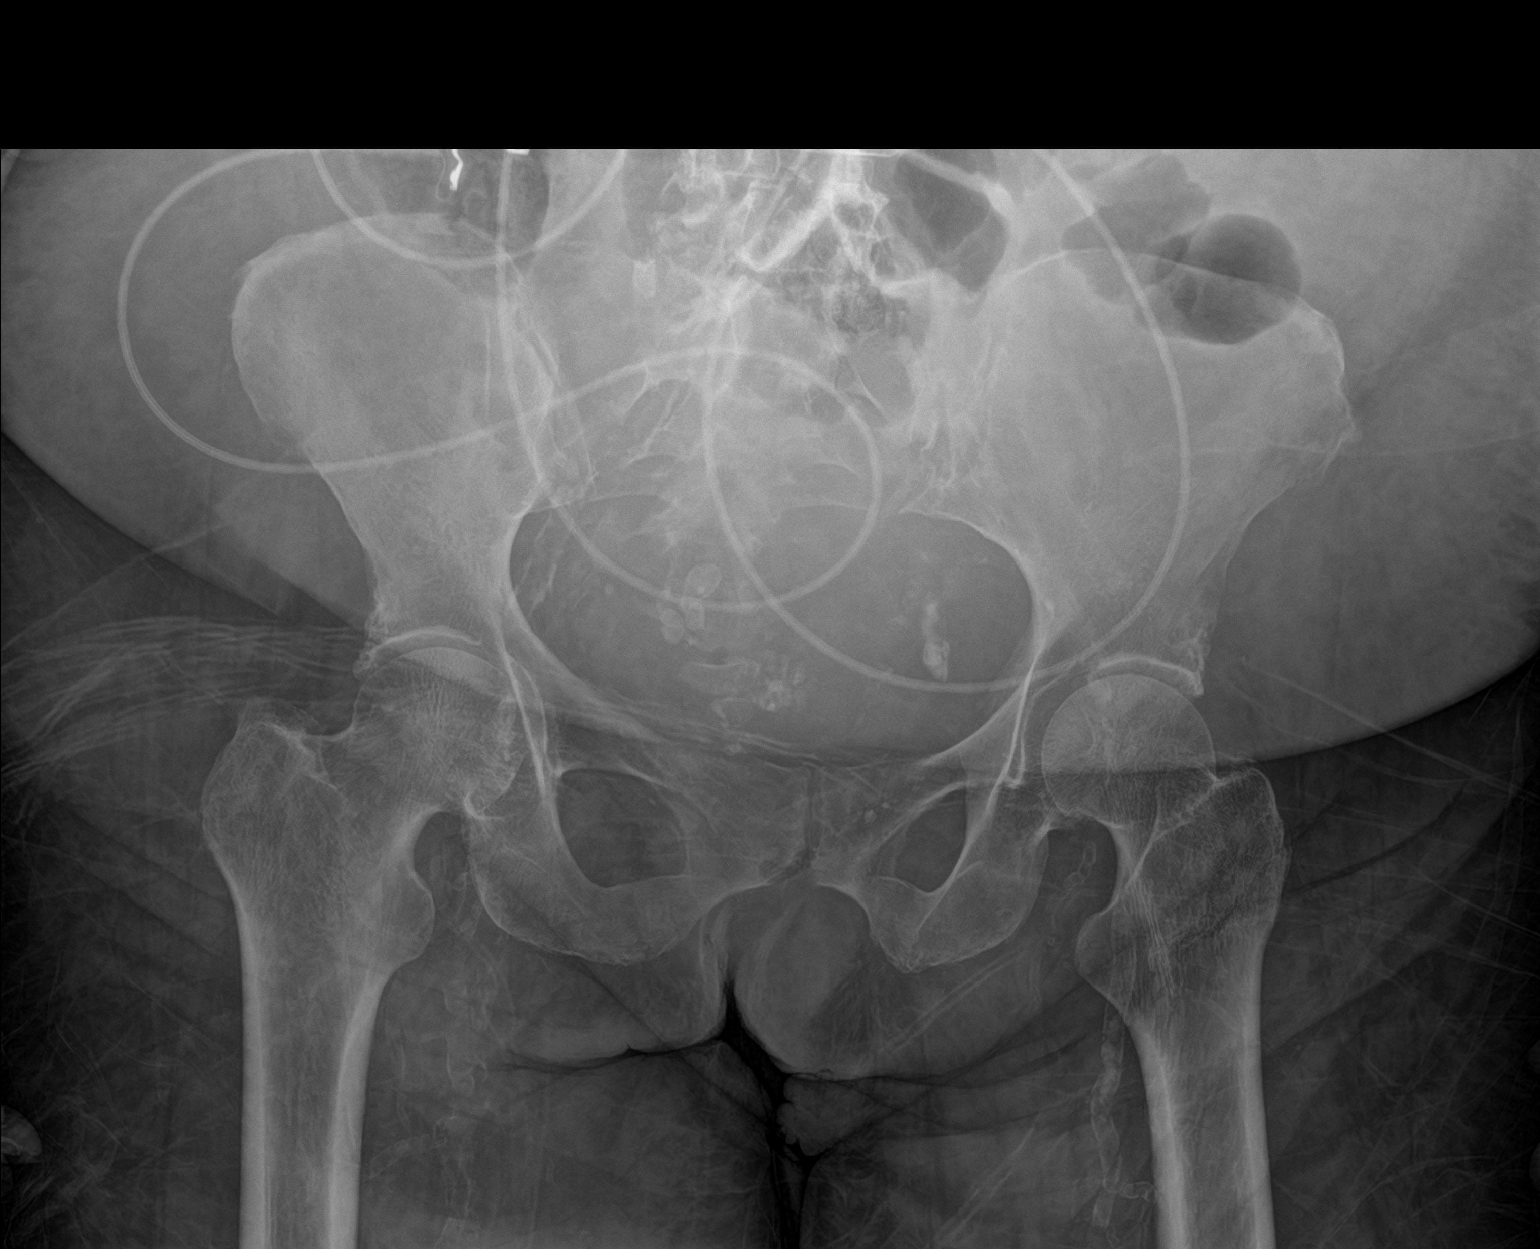

[3 of 3 positions shown; findings below may reference images not displayed]

FINDINGS: Osseous demineralization.

Hip and SI joint spaces symmetric and preserved.

No acute fracture, dislocation, or bone destruction.

Extensive atherosclerotic calcification.

Calcified pelvic phleboliths.
IMPRESSION: No acute osseous abnormalities.
